# Patient Record
Sex: Female | Born: 1965 | Race: Black or African American | Hispanic: No | Marital: Single | Smoking: Current every day smoker
Health system: Southern US, Community
[De-identification: ages and names within clinical notes are randomized; demographics above are authoritative.]

## PROBLEM LIST (undated history)

## (undated) DIAGNOSIS — R569 Unspecified convulsions: Secondary | ICD-10-CM

## (undated) DIAGNOSIS — R0600 Dyspnea, unspecified: Secondary | ICD-10-CM

## (undated) DIAGNOSIS — Z87442 Personal history of urinary calculi: Secondary | ICD-10-CM

## (undated) DIAGNOSIS — Z9981 Dependence on supplemental oxygen: Secondary | ICD-10-CM

## (undated) DIAGNOSIS — F419 Anxiety disorder, unspecified: Secondary | ICD-10-CM

## (undated) DIAGNOSIS — F32A Depression, unspecified: Secondary | ICD-10-CM

## (undated) DIAGNOSIS — D860 Sarcoidosis of lung: Secondary | ICD-10-CM

## (undated) DIAGNOSIS — N390 Urinary tract infection, site not specified: Secondary | ICD-10-CM

## (undated) DIAGNOSIS — I499 Cardiac arrhythmia, unspecified: Secondary | ICD-10-CM

## (undated) DIAGNOSIS — J449 Chronic obstructive pulmonary disease, unspecified: Secondary | ICD-10-CM

## (undated) DIAGNOSIS — I10 Essential (primary) hypertension: Secondary | ICD-10-CM

## (undated) DIAGNOSIS — R87619 Unspecified abnormal cytological findings in specimens from cervix uteri: Secondary | ICD-10-CM

## (undated) DIAGNOSIS — F329 Major depressive disorder, single episode, unspecified: Secondary | ICD-10-CM

## (undated) DIAGNOSIS — E785 Hyperlipidemia, unspecified: Secondary | ICD-10-CM

## (undated) DIAGNOSIS — G4730 Sleep apnea, unspecified: Secondary | ICD-10-CM

## (undated) DIAGNOSIS — A770 Spotted fever due to Rickettsia rickettsii: Secondary | ICD-10-CM

## (undated) DIAGNOSIS — M1990 Unspecified osteoarthritis, unspecified site: Secondary | ICD-10-CM

## (undated) DIAGNOSIS — F319 Bipolar disorder, unspecified: Secondary | ICD-10-CM

## (undated) DIAGNOSIS — N200 Calculus of kidney: Secondary | ICD-10-CM

## (undated) HISTORY — DX: Dependence on supplemental oxygen: Z99.81

## (undated) HISTORY — PX: BLADDER SURGERY: SHX569

## (undated) HISTORY — DX: Major depressive disorder, single episode, unspecified: F32.9

## (undated) HISTORY — DX: Hyperlipidemia, unspecified: E78.5

## (undated) HISTORY — DX: Unspecified convulsions: R56.9

## (undated) HISTORY — PX: COLONOSCOPY: SHX174

## (undated) HISTORY — PX: KIDNEY STONE SURGERY: SHX686

## (undated) HISTORY — DX: Unspecified abnormal cytological findings in specimens from cervix uteri: R87.619

## (undated) HISTORY — DX: Unspecified osteoarthritis, unspecified site: M19.90

## (undated) HISTORY — PX: TUBAL LIGATION: SHX77

## (undated) HISTORY — DX: Depression, unspecified: F32.A

## (undated) HISTORY — PX: FOOT SURGERY: SHX648

## (undated) HISTORY — DX: Bipolar disorder, unspecified: F31.9

## (undated) HISTORY — PX: JOINT REPLACEMENT: SHX530

## (undated) SURGERY — Surgical Case
Anesthesia: *Unknown

## (undated) SURGERY — BRONCHOSCOPY, FLEXIBLE
Anesthesia: Moderate Sedation | Laterality: Bilateral

---

## 1997-12-24 HISTORY — PX: ABDOMINAL HYSTERECTOMY: SHX81

## 2007-10-01 ENCOUNTER — Emergency Department: Payer: Self-pay | Admitting: Emergency Medicine

## 2009-03-08 ENCOUNTER — Ambulatory Visit: Payer: Self-pay | Admitting: Internal Medicine

## 2009-08-17 ENCOUNTER — Emergency Department: Payer: Self-pay | Admitting: Emergency Medicine

## 2010-02-06 ENCOUNTER — Ambulatory Visit: Payer: Self-pay | Admitting: Otolaryngology

## 2011-04-20 ENCOUNTER — Ambulatory Visit: Payer: Self-pay | Admitting: Neurology

## 2011-08-08 ENCOUNTER — Emergency Department: Payer: Self-pay | Admitting: Emergency Medicine

## 2011-09-29 ENCOUNTER — Emergency Department: Payer: Self-pay | Admitting: Emergency Medicine

## 2011-10-08 ENCOUNTER — Ambulatory Visit: Payer: Self-pay | Admitting: Urology

## 2011-10-15 ENCOUNTER — Ambulatory Visit: Payer: Self-pay | Admitting: Urology

## 2011-10-18 ENCOUNTER — Emergency Department: Payer: Self-pay | Admitting: Emergency Medicine

## 2011-10-29 ENCOUNTER — Ambulatory Visit: Payer: Self-pay | Admitting: Urology

## 2012-05-08 ENCOUNTER — Observation Stay: Payer: Self-pay | Admitting: Internal Medicine

## 2012-05-08 DIAGNOSIS — R079 Chest pain, unspecified: Secondary | ICD-10-CM

## 2012-05-08 LAB — BASIC METABOLIC PANEL
Anion Gap: 9 (ref 7–16)
BUN: 18 mg/dL (ref 7–18)
Calcium, Total: 8.5 mg/dL (ref 8.5–10.1)
Chloride: 107 mmol/L (ref 98–107)
EGFR (African American): >60
Glucose: 105 mg/dL — ABNORMAL HIGH (ref 65–99)
Osmolality: 284 (ref 275–301)
Sodium: 141 mmol/L (ref 136–145)

## 2012-05-08 LAB — CK TOTAL AND CKMB (NOT AT ARMC)
CK, Total: 75 U/L (ref 21–215)
CK, Total: 80 U/L (ref 21–215)
CK-MB: 0.5 ng/mL (ref 0.5–3.6)

## 2012-05-08 LAB — CBC
HCT: 40.3 % (ref 35.0–47.0)
HGB: 13.7 g/dL (ref 12.0–16.0)
MCHC: 34 g/dL (ref 32.0–36.0)
MCV: 95 fL (ref 80–100)
Platelet: 272 10*3/uL (ref 150–440)
RDW: 14.8 % — ABNORMAL HIGH (ref 11.5–14.5)

## 2012-05-08 LAB — TROPONIN I
Troponin-I: <0.02 ng/mL
Troponin-I: <0.02 ng/mL

## 2012-06-26 ENCOUNTER — Emergency Department: Payer: Self-pay | Admitting: *Deleted

## 2012-08-16 ENCOUNTER — Observation Stay: Payer: Self-pay | Admitting: Internal Medicine

## 2012-08-16 LAB — BASIC METABOLIC PANEL
Anion Gap: 9 (ref 7–16)
Calcium, Total: 8.2 mg/dL — ABNORMAL LOW (ref 8.5–10.1)
Co2: 23 mmol/L (ref 21–32)
Creatinine: 0.86 mg/dL (ref 0.60–1.30)
EGFR (African American): >60
Osmolality: 284 (ref 275–301)
Sodium: 140 mmol/L (ref 136–145)

## 2012-08-16 LAB — CBC
HGB: 13.7 g/dL (ref 12.0–16.0)
RBC: 4.2 10*6/uL (ref 3.80–5.20)
WBC: 7.5 10*3/uL (ref 3.6–11.0)

## 2013-05-24 DIAGNOSIS — R569 Unspecified convulsions: Secondary | ICD-10-CM

## 2013-05-24 HISTORY — DX: Unspecified convulsions: R56.9

## 2013-06-12 ENCOUNTER — Emergency Department: Payer: Self-pay | Admitting: Emergency Medicine

## 2013-06-12 LAB — CBC
HCT: 41.3 % (ref 35.0–47.0)
HGB: 14.3 g/dL (ref 12.0–16.0)
MCH: 32.1 pg (ref 26.0–34.0)
MCHC: 34.6 g/dL (ref 32.0–36.0)
MCV: 93 fL (ref 80–100)
RBC: 4.46 10*6/uL (ref 3.80–5.20)
RDW: 14.3 % (ref 11.5–14.5)
WBC: 5.1 10*3/uL (ref 3.6–11.0)

## 2013-06-12 LAB — COMPREHENSIVE METABOLIC PANEL
Albumin: 3.8 g/dL (ref 3.4–5.0)
Alkaline Phosphatase: 96 U/L (ref 50–136)
Anion Gap: 8 (ref 7–16)
Bilirubin,Total: 0.5 mg/dL (ref 0.2–1.0)
Creatinine: 0.62 mg/dL (ref 0.60–1.30)
EGFR (African American): >60
EGFR (Non-African Amer.): >60
Glucose: 127 mg/dL — ABNORMAL HIGH (ref 65–99)
SGOT(AST): 22 U/L (ref 15–37)

## 2013-06-12 LAB — CK TOTAL AND CKMB (NOT AT ARMC)
CK, Total: 80 U/L (ref 21–215)
CK-MB: 0.7 ng/mL (ref 0.5–3.6)

## 2013-06-12 LAB — TROPONIN I: Troponin-I: <0.02 ng/mL

## 2013-06-14 ENCOUNTER — Emergency Department: Payer: Self-pay | Admitting: Emergency Medicine

## 2013-06-14 LAB — CK TOTAL AND CKMB (NOT AT ARMC)
CK, Total: 61 U/L (ref 21–215)
CK-MB: <0.5 ng/mL — ABNORMAL LOW (ref 0.5–3.6)

## 2013-06-14 LAB — CBC
HCT: 40.2 % (ref 35.0–47.0)
MCHC: 35.2 g/dL (ref 32.0–36.0)
MCV: 93 fL (ref 80–100)
Platelet: 266 10*3/uL (ref 150–440)
RBC: 4.31 10*6/uL (ref 3.80–5.20)
RDW: 14.4 % (ref 11.5–14.5)
WBC: 7 10*3/uL (ref 3.6–11.0)

## 2013-06-14 LAB — COMPREHENSIVE METABOLIC PANEL
Anion Gap: 8 (ref 7–16)
Bilirubin,Total: 0.3 mg/dL (ref 0.2–1.0)
Calcium, Total: 8.6 mg/dL (ref 8.5–10.1)
Chloride: 106 mmol/L (ref 98–107)
Co2: 27 mmol/L (ref 21–32)
EGFR (Non-African Amer.): >60
Glucose: 114 mg/dL — ABNORMAL HIGH (ref 65–99)
Potassium: 3.4 mmol/L — ABNORMAL LOW (ref 3.5–5.1)
SGOT(AST): 21 U/L (ref 15–37)
Sodium: 141 mmol/L (ref 136–145)

## 2013-06-14 LAB — TROPONIN I: Troponin-I: <0.02 ng/mL

## 2013-06-30 LAB — HM PAP SMEAR: HM Pap smear: NEGATIVE

## 2013-07-22 ENCOUNTER — Emergency Department: Payer: Self-pay | Admitting: Emergency Medicine

## 2013-07-27 ENCOUNTER — Ambulatory Visit: Payer: Self-pay | Admitting: Neurology

## 2013-08-11 ENCOUNTER — Emergency Department: Payer: Self-pay | Admitting: Emergency Medicine

## 2013-08-11 LAB — URINALYSIS, COMPLETE
Glucose,UR: NEGATIVE mg/dL (ref 0–75)
Ketone: NEGATIVE
Nitrite: NEGATIVE
Ph: 6 (ref 4.5–8.0)
Protein: NEGATIVE
Specific Gravity: 1.012 (ref 1.003–1.030)
Squamous Epithelial: 35
WBC UR: 121 /HPF (ref 0–5)

## 2013-08-11 LAB — BASIC METABOLIC PANEL
BUN: 11 mg/dL (ref 7–18)
Chloride: 105 mmol/L (ref 98–107)
Creatinine: 0.77 mg/dL (ref 0.60–1.30)
EGFR (African American): >60
Glucose: 84 mg/dL (ref 65–99)
Sodium: 137 mmol/L (ref 136–145)

## 2013-08-11 LAB — CBC WITH DIFFERENTIAL/PLATELET
Eosinophil %: 1.2 %
HGB: 15.5 g/dL (ref 12.0–16.0)
Lymphocyte #: 2.9 10*3/uL (ref 1.0–3.6)
MCH: 32.3 pg (ref 26.0–34.0)
MCV: 91 fL (ref 80–100)
Neutrophil #: 1.8 10*3/uL (ref 1.4–6.5)
Platelet: 305 10*3/uL (ref 150–440)
RBC: 4.81 10*6/uL (ref 3.80–5.20)
RDW: 13.9 % (ref 11.5–14.5)
WBC: 5.3 10*3/uL (ref 3.6–11.0)

## 2013-08-12 LAB — TROPONIN I: Troponin-I: <0.02 ng/mL

## 2013-08-21 ENCOUNTER — Ambulatory Visit: Payer: Self-pay | Admitting: Neurology

## 2013-08-26 ENCOUNTER — Emergency Department: Payer: Self-pay | Admitting: Emergency Medicine

## 2013-09-21 ENCOUNTER — Ambulatory Visit: Payer: Self-pay | Admitting: Specialist

## 2013-11-08 ENCOUNTER — Emergency Department: Payer: Self-pay | Admitting: Internal Medicine

## 2013-11-08 LAB — URINALYSIS, COMPLETE
Bacteria: NONE SEEN
Bilirubin,UR: NEGATIVE
Ketone: NEGATIVE
Leukocyte Esterase: NEGATIVE
Protein: NEGATIVE
RBC,UR: NONE SEEN /HPF (ref 0–5)
Specific Gravity: 1.006 (ref 1.003–1.030)
WBC UR: <1 /HPF (ref 0–5)

## 2013-11-08 LAB — DRUG SCREEN, URINE
Barbiturates, Ur Screen: NEGATIVE (ref ?–200)
Benzodiazepine, Ur Scrn: POSITIVE (ref ?–200)
MDMA (Ecstasy)Ur Screen: NEGATIVE (ref ?–500)
Methadone, Ur Screen: NEGATIVE (ref ?–300)
Opiate, Ur Screen: NEGATIVE (ref ?–300)
Tricyclic, Ur Screen: NEGATIVE (ref ?–1000)

## 2013-11-08 LAB — COMPREHENSIVE METABOLIC PANEL
BUN: 12 mg/dL (ref 7–18)
Chloride: 104 mmol/L (ref 98–107)
EGFR (African American): >60
EGFR (Non-African Amer.): >60
Glucose: 99 mg/dL (ref 65–99)
Osmolality: 274 (ref 275–301)
SGOT(AST): 26 U/L (ref 15–37)
Sodium: 137 mmol/L (ref 136–145)

## 2013-11-08 LAB — CBC
HGB: 15.1 g/dL (ref 12.0–16.0)
MCH: 32 pg (ref 26.0–34.0)
MCHC: 34.2 g/dL (ref 32.0–36.0)
MCV: 94 fL (ref 80–100)
Platelet: 286 10*3/uL (ref 150–440)
WBC: 5.5 10*3/uL (ref 3.6–11.0)

## 2013-11-28 ENCOUNTER — Ambulatory Visit: Payer: Self-pay | Admitting: Neurology

## 2013-11-28 ENCOUNTER — Inpatient Hospital Stay: Payer: Self-pay | Admitting: Family Medicine

## 2013-11-28 LAB — CBC
HCT: 43.5 % (ref 35.0–47.0)
MCHC: 34.2 g/dL (ref 32.0–36.0)
MCV: 93 fL (ref 80–100)
Platelet: 312 10*3/uL (ref 150–440)
RBC: 4.7 10*6/uL (ref 3.80–5.20)
RDW: 14.1 % (ref 11.5–14.5)

## 2013-11-28 LAB — COMPREHENSIVE METABOLIC PANEL
BUN: 17 mg/dL (ref 7–18)
Bilirubin,Total: 0.3 mg/dL (ref 0.2–1.0)
Chloride: 106 mmol/L (ref 98–107)
Co2: 25 mmol/L (ref 21–32)
EGFR (African American): >60
EGFR (Non-African Amer.): >60
Glucose: 120 mg/dL — ABNORMAL HIGH (ref 65–99)
Potassium: 3.2 mmol/L — ABNORMAL LOW (ref 3.5–5.1)
SGOT(AST): 28 U/L (ref 15–37)
Sodium: 140 mmol/L (ref 136–145)

## 2013-11-28 LAB — PROTIME-INR
INR: 1
Prothrombin Time: 13.1 secs (ref 11.5–14.7)

## 2013-11-28 LAB — APTT: Activated PTT: 28.6 secs (ref 23.6–35.9)

## 2013-11-28 LAB — CK TOTAL AND CKMB (NOT AT ARMC): CK, Total: 55 U/L (ref 21–215)

## 2013-11-28 LAB — SEDIMENTATION RATE: Erythrocyte Sed Rate: 45 mm/hr — ABNORMAL HIGH (ref 0–20)

## 2013-11-29 LAB — CBC WITH DIFFERENTIAL/PLATELET
Basophil #: 0 10*3/uL (ref 0.0–0.1)
Basophil %: 0.5 %
Eosinophil #: 0 10*3/uL (ref 0.0–0.7)
Eosinophil %: 0 %
HGB: 13.9 g/dL (ref 12.0–16.0)
Lymphocyte %: 20.5 %
MCH: 31.9 pg (ref 26.0–34.0)
MCHC: 34.4 g/dL (ref 32.0–36.0)
Monocyte #: 0.1 x10 3/mm — ABNORMAL LOW (ref 0.2–0.9)
Monocyte %: 1.5 %
Neutrophil %: 77.5 %
Platelet: 290 10*3/uL (ref 150–440)
RDW: 14.2 % (ref 11.5–14.5)
WBC: 4.9 10*3/uL (ref 3.6–11.0)

## 2013-11-29 LAB — BASIC METABOLIC PANEL
Anion Gap: 6 — ABNORMAL LOW (ref 7–16)
Co2: 25 mmol/L (ref 21–32)
EGFR (African American): >60
EGFR (Non-African Amer.): >60
Glucose: 154 mg/dL — ABNORMAL HIGH (ref 65–99)
Potassium: 4 mmol/L (ref 3.5–5.1)

## 2013-11-29 LAB — LIPID PANEL
Cholesterol: 194 mg/dL (ref 0–200)
HDL Cholesterol: 43 mg/dL (ref 40–60)
Ldl Cholesterol, Calc: 139 mg/dL — ABNORMAL HIGH (ref 0–100)
VLDL Cholesterol, Calc: 12 mg/dL (ref 5–40)

## 2013-11-30 LAB — BASIC METABOLIC PANEL
Anion Gap: 7 (ref 7–16)
BUN: 16 mg/dL (ref 7–18)
Chloride: 109 mmol/L — ABNORMAL HIGH (ref 98–107)
Creatinine: 0.81 mg/dL (ref 0.60–1.30)
EGFR (African American): >60
Osmolality: 278 (ref 275–301)

## 2013-11-30 LAB — CBC WITH DIFFERENTIAL/PLATELET
Basophil #: 0 10*3/uL (ref 0.0–0.1)
Basophil %: 0.3 %
Eosinophil #: 0 10*3/uL (ref 0.0–0.7)
HGB: 13 g/dL (ref 12.0–16.0)
Lymphocyte %: 8.2 %
MCHC: 33.9 g/dL (ref 32.0–36.0)
Monocyte #: 1.1 x10 3/mm — ABNORMAL HIGH (ref 0.2–0.9)
Neutrophil %: 85.2 %
Platelet: 300 10*3/uL (ref 150–440)
RBC: 4.1 10*6/uL (ref 3.80–5.20)
RDW: 14.1 % (ref 11.5–14.5)

## 2013-11-30 LAB — PROTIME-INR
INR: 1
Prothrombin Time: 13.4 secs (ref 11.5–14.7)

## 2013-11-30 LAB — APTT: Activated PTT: 27.4 secs (ref 23.6–35.9)

## 2013-11-30 LAB — TSH: Thyroid Stimulating Horm: 0.748 u[IU]/mL

## 2013-12-24 ENCOUNTER — Ambulatory Visit: Payer: Self-pay | Admitting: Neurology

## 2013-12-28 ENCOUNTER — Emergency Department: Payer: Self-pay | Admitting: Emergency Medicine

## 2014-05-01 ENCOUNTER — Emergency Department: Payer: Self-pay | Admitting: Emergency Medicine

## 2014-06-20 ENCOUNTER — Observation Stay: Payer: Self-pay | Admitting: Internal Medicine

## 2014-06-20 LAB — COMPREHENSIVE METABOLIC PANEL
ALK PHOS: 86 U/L
AST: 21 U/L (ref 15–37)
Albumin: 3.5 g/dL (ref 3.4–5.0)
Anion Gap: 5 — ABNORMAL LOW (ref 7–16)
BILIRUBIN TOTAL: 0.9 mg/dL (ref 0.2–1.0)
BUN: 10 mg/dL (ref 7–18)
Calcium, Total: 8.7 mg/dL (ref 8.5–10.1)
Chloride: 107 mmol/L (ref 98–107)
Co2: 27 mmol/L (ref 21–32)
Creatinine: 0.87 mg/dL (ref 0.60–1.30)
EGFR (African American): >60
Glucose: 76 mg/dL (ref 65–99)
OSMOLALITY: 275 (ref 275–301)
Potassium: 3.9 mmol/L (ref 3.5–5.1)
SGPT (ALT): 23 U/L (ref 12–78)
SODIUM: 139 mmol/L (ref 136–145)
TOTAL PROTEIN: 7.7 g/dL (ref 6.4–8.2)

## 2014-06-20 LAB — URINALYSIS, COMPLETE
BILIRUBIN, UR: NEGATIVE
BLOOD: NEGATIVE
Bacteria: NONE SEEN
Glucose,UR: NEGATIVE mg/dL (ref 0–75)
Ketone: NEGATIVE
Leukocyte Esterase: NEGATIVE
NITRITE: NEGATIVE
PH: 6 (ref 4.5–8.0)
PROTEIN: NEGATIVE
RBC,UR: 1 /HPF (ref 0–5)
Specific Gravity: 1.004 (ref 1.003–1.030)
Squamous Epithelial: NONE SEEN
WBC UR: NONE SEEN /HPF (ref 0–5)

## 2014-06-20 LAB — CBC
HCT: 40.5 % (ref 35.0–47.0)
HGB: 13.8 g/dL (ref 12.0–16.0)
MCH: 31.6 pg (ref 26.0–34.0)
MCHC: 34 g/dL (ref 32.0–36.0)
MCV: 93 fL (ref 80–100)
Platelet: 237 10*3/uL (ref 150–440)
RBC: 4.36 10*6/uL (ref 3.80–5.20)
RDW: 16.8 % — ABNORMAL HIGH (ref 11.5–14.5)
WBC: 5 10*3/uL (ref 3.6–11.0)

## 2014-06-20 LAB — TROPONIN I: Troponin-I: <0.02 ng/mL

## 2014-06-20 LAB — PROTIME-INR
INR: 1
PROTHROMBIN TIME: 12.7 s (ref 11.5–14.7)

## 2014-06-20 LAB — TSH: Thyroid Stimulating Horm: 0.972 u[IU]/mL

## 2014-06-21 ENCOUNTER — Ambulatory Visit: Payer: Self-pay | Admitting: Neurology

## 2014-06-22 LAB — BASIC METABOLIC PANEL
ANION GAP: 3 — AB (ref 7–16)
BUN: 17 mg/dL (ref 7–18)
CALCIUM: 8.5 mg/dL (ref 8.5–10.1)
CO2: 27 mmol/L (ref 21–32)
Chloride: 108 mmol/L — ABNORMAL HIGH (ref 98–107)
Creatinine: 0.84 mg/dL (ref 0.60–1.30)
EGFR (African American): >60
EGFR (Non-African Amer.): >60
Glucose: 90 mg/dL (ref 65–99)
Osmolality: 277 (ref 275–301)
Potassium: 3.9 mmol/L (ref 3.5–5.1)
SODIUM: 138 mmol/L (ref 136–145)

## 2014-06-22 LAB — CBC WITH DIFFERENTIAL/PLATELET
BASOS PCT: 0.5 %
Basophil #: 0 10*3/uL (ref 0.0–0.1)
Eosinophil #: 0.1 10*3/uL (ref 0.0–0.7)
Eosinophil %: 1.1 %
HCT: 38 % (ref 35.0–47.0)
HGB: 12.6 g/dL (ref 12.0–16.0)
LYMPHS ABS: 2.7 10*3/uL (ref 1.0–3.6)
Lymphocyte %: 51.2 %
MCH: 30.7 pg (ref 26.0–34.0)
MCHC: 33 g/dL (ref 32.0–36.0)
MCV: 93 fL (ref 80–100)
Monocyte #: 0.5 x10 3/mm (ref 0.2–0.9)
Monocyte %: 9 %
Neutrophil #: 2 10*3/uL (ref 1.4–6.5)
Neutrophil %: 38.2 %
Platelet: 217 10*3/uL (ref 150–440)
RBC: 4.09 10*6/uL (ref 3.80–5.20)
RDW: 16.5 % — ABNORMAL HIGH (ref 11.5–14.5)
WBC: 5.3 10*3/uL (ref 3.6–11.0)

## 2014-06-24 DIAGNOSIS — Z720 Tobacco use: Secondary | ICD-10-CM | POA: Insufficient documentation

## 2014-06-24 DIAGNOSIS — R531 Weakness: Secondary | ICD-10-CM | POA: Insufficient documentation

## 2014-06-24 DIAGNOSIS — Z8669 Personal history of other diseases of the nervous system and sense organs: Secondary | ICD-10-CM | POA: Insufficient documentation

## 2014-10-19 ENCOUNTER — Emergency Department: Payer: Self-pay | Admitting: Internal Medicine

## 2014-10-19 LAB — BASIC METABOLIC PANEL
Anion Gap: 7 (ref 7–16)
BUN: 15 mg/dL (ref 7–18)
CALCIUM: 8.5 mg/dL (ref 8.5–10.1)
CHLORIDE: 108 mmol/L — AB (ref 98–107)
Co2: 27 mmol/L (ref 21–32)
Creatinine: 0.74 mg/dL (ref 0.60–1.30)
EGFR (African American): >60
EGFR (Non-African Amer.): >60
Glucose: 105 mg/dL — ABNORMAL HIGH (ref 65–99)
Osmolality: 284 (ref 275–301)
Potassium: 3.6 mmol/L (ref 3.5–5.1)
Sodium: 142 mmol/L (ref 136–145)

## 2014-10-19 LAB — D-DIMER(ARMC): D-Dimer: 811 ng/ml

## 2014-10-19 LAB — URINALYSIS, COMPLETE
BLOOD: NEGATIVE
Bacteria: NONE SEEN
Bilirubin,UR: NEGATIVE
GLUCOSE, UR: NEGATIVE mg/dL (ref 0–75)
Ketone: NEGATIVE
Leukocyte Esterase: NEGATIVE
NITRITE: NEGATIVE
PROTEIN: NEGATIVE
Ph: 7 (ref 4.5–8.0)
RBC,UR: 18 /HPF (ref 0–5)
SPECIFIC GRAVITY: 1.02 (ref 1.003–1.030)
Squamous Epithelial: 9

## 2014-10-19 LAB — CBC
HCT: 41.1 % (ref 35.0–47.0)
HGB: 13.5 g/dL (ref 12.0–16.0)
MCH: 30 pg (ref 26.0–34.0)
MCHC: 32.7 g/dL (ref 32.0–36.0)
MCV: 92 fL (ref 80–100)
Platelet: 274 10*3/uL (ref 150–440)
RBC: 4.48 10*6/uL (ref 3.80–5.20)
RDW: 16.2 % — AB (ref 11.5–14.5)
WBC: 7.1 10*3/uL (ref 3.6–11.0)

## 2014-10-19 LAB — TROPONIN I
Troponin-I: <0.02 ng/mL
Troponin-I: <0.02 ng/mL

## 2014-10-19 LAB — PRO B NATRIURETIC PEPTIDE: B-Type Natriuretic Peptide: 257 pg/mL — ABNORMAL HIGH (ref 0–125)

## 2014-10-27 ENCOUNTER — Ambulatory Visit: Payer: Self-pay

## 2014-10-27 ENCOUNTER — Ambulatory Visit: Admit: 2014-10-27 | Disposition: A | Discharge status: Home / Self Care | Payer: Self-pay | Admitting: Urgent Care

## 2014-11-08 ENCOUNTER — Observation Stay: Payer: Self-pay | Admitting: Internal Medicine

## 2014-11-08 LAB — URINALYSIS, COMPLETE
BILIRUBIN, UR: NEGATIVE
BLOOD: NEGATIVE
Bacteria: NONE SEEN
Glucose,UR: NEGATIVE mg/dL (ref 0–75)
Ketone: NEGATIVE
LEUKOCYTE ESTERASE: NEGATIVE
NITRITE: NEGATIVE
Ph: 6 (ref 4.5–8.0)
Protein: NEGATIVE
RBC,UR: 2 /HPF (ref 0–5)
Specific Gravity: 1.006 (ref 1.003–1.030)
Squamous Epithelial: <1

## 2014-11-08 LAB — DRUG SCREEN, URINE

## 2014-11-08 LAB — COMPREHENSIVE METABOLIC PANEL
ALK PHOS: 105 U/L
AST: 21 U/L (ref 15–37)
Albumin: 3.6 g/dL (ref 3.4–5.0)
Anion Gap: 7 (ref 7–16)
BUN: 18 mg/dL (ref 7–18)
Bilirubin,Total: 0.3 mg/dL (ref 0.2–1.0)
CREATININE: 0.95 mg/dL (ref 0.60–1.30)
Calcium, Total: 8.1 mg/dL — ABNORMAL LOW (ref 8.5–10.1)
Chloride: 105 mmol/L (ref 98–107)
Co2: 27 mmol/L (ref 21–32)
EGFR (Non-African Amer.): >60
Glucose: 93 mg/dL (ref 65–99)
Osmolality: 279 (ref 275–301)
Potassium: 4.1 mmol/L (ref 3.5–5.1)
SGPT (ALT): 29 U/L
Sodium: 139 mmol/L (ref 136–145)
Total Protein: 8 g/dL (ref 6.4–8.2)

## 2014-11-08 LAB — TROPONIN I
Troponin-I: <0.02 ng/mL
Troponin-I: <0.02 ng/mL

## 2014-11-08 LAB — CBC
HCT: 41.4 % (ref 35.0–47.0)
HGB: 13.9 g/dL (ref 12.0–16.0)
MCH: 30.6 pg (ref 26.0–34.0)
MCHC: 33.5 g/dL (ref 32.0–36.0)
MCV: 92 fL (ref 80–100)
Platelet: 271 10*3/uL (ref 150–440)
RBC: 4.53 10*6/uL (ref 3.80–5.20)
RDW: 16.7 % — AB (ref 11.5–14.5)
WBC: 7.1 10*3/uL (ref 3.6–11.0)

## 2014-11-08 LAB — CK-MB
CK-MB: 0.5 ng/mL (ref 0.5–3.6)
CK-MB: 0.6 ng/mL (ref 0.5–3.6)
CK-MB: <0.5 ng/mL — ABNORMAL LOW (ref 0.5–3.6)

## 2014-11-19 ENCOUNTER — Emergency Department: Payer: Self-pay | Admitting: Emergency Medicine

## 2015-02-14 ENCOUNTER — Ambulatory Visit: Payer: Self-pay | Admitting: Neurology

## 2015-02-14 ENCOUNTER — Observation Stay: Payer: Self-pay | Admitting: Internal Medicine

## 2015-03-10 ENCOUNTER — Encounter: Payer: Self-pay | Admitting: *Deleted

## 2015-03-23 ENCOUNTER — Encounter: Payer: Self-pay | Admitting: General Surgery

## 2015-03-23 ENCOUNTER — Encounter: Payer: 59 | Admitting: General Surgery

## 2015-03-23 NOTE — Progress Notes (Signed)
This encounter was created in error - please disregard.

## 2015-04-12 NOTE — H&P (Signed)
PATIENT NAME:  Nicole Wyatt MR#:  [id number] DATE OF BIRTH:  June 17, 1966  DATE OF ADMISSION:  08/16/2012  PRIMARY CARE PHYSICIAN: Dr. Toy CookeyErnest Eason REFERRING PHYSICIAN: Dr. Lowella FairyJohn Woodruff  CHIEF COMPLAINT: Allergic reaction to a bee sting.   HISTORY OF PRESENT ILLNESS: Nicole Wyatt is a 49 year old African American female who was in her usual state of health until today when she had a bee sting around 5:00 p.m. resulting in feeling of throat being closed associated with shortness of breath and palpitations. She has an EpiPen, however, she did not operate that well and failed to use it appropriately. She called EMS and upon their arrival they gave her epinephrine, Solu-Medrol, H2-blocker and Benadryl. Patient was transported to the Emergency Department she was feeling much better and improving. While she is waiting here she had recurrence of the same symptoms. Treated here and now she is improving but will be admitted for 24-hour observation because of the recurrence of symptoms.    REVIEW OF SYSTEMS: CONSTITUTIONAL: Denies any fever. No chills. No fatigue. EYES: No blurring of vision. No double vision. ENT: No hearing impairment. No sore throat but she felt throat was closing after the bee sting. No dysphagia. CARDIOVASCULAR: No chest pain but she had some shortness of breath and palpitations. No syncope. RESPIRATORY: Reports shortness of breath, now it is improving. No chest pain. No cough. GASTROINTESTINAL: No abdominal pain. No vomiting. No diarrhea. GENITOURINARY: No dysuria. No frequency of urination. MUSCULOSKELETAL: No joint pain or swelling. No muscular pain or swelling. INTEGUMENTARY: No skin rash. No ulcers. NEUROLOGY: No focal weakness. No seizure activity. No headache. PSYCHIATRY: She has history of anxiety and depression. ENDOCRINE: Denies any polyuria or polydipsia. No heat or cold intolerance.   PAST MEDICAL HISTORY:  1. Admitted here in May of this year with pneumonia. Because of chest  pain during that time she had also nuclear stress test with [facility] which was negative.  2. Depression/anxiety. 3. Arthritis. 4. Tobacco abuse.    PAST SURGICAL HISTORY:  1. Nephrolithiasis with removal of stone.  2. Removal of a polyp from vocal cords.  3. Tonsillectomy. 4. Partial hysterectomy.   FAMILY HISTORY: Both parents suffered from hypertension and but they are still alive.   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 She is single and has one child.   SOCIAL HABITS: Chronic smoking. She started smoking at age of 49 and lately she cut down smoking to three cigarettes a day since the place where she is working is a nonsmoker facility. No history of alcohol abuse. There is a remote history of trying marijuana that was at the age of 49 but she quit.   ADMISSION MEDICATIONS:  1. Xanax 0.25 mg 3 times a day p.r.n.  2. Meloxicam 15 mg once a day. 3. Cymbalta 30 mg a day.   ALLERGIES: Penicillin causing hives and rash. Percocet also causing skin rash.   PHYSICAL EXAMINATION:  VITAL SIGNS: Blood pressure 144/80, respiratory rate 20, pulse 64, temperature 98.5, oxygen saturation 97%.   GENERAL APPEARANCE: Young female lying in bed in no acute distress.   HEAD AND NECK: No pallor. No icterus. No cyanosis.   ENT: Hearing was normal. Nasal mucosa, lips, tongue were normal. Uvula at midline. No evidence of swelling.   EYES: Normal eyelids and conjunctiva. The patient is wearing colored contact lens. Pupils about 3 mm, could not detect reactivity to light.   NECK: Supple. Trachea at midline. No thyromegaly.  No cervical lymphadenopathy. No masses.   HEART: Normal S1, S2. No S3, S4. No murmur. No gallop. No carotid bruits.   RESPIRATORY: Normal breathing pattern without use of accessory muscles. No rales. No wheezing.   ABDOMEN: Soft without tenderness. No hepatosplenomegaly. No masses. No hernias.   SKIN: No ulcers. No subcutaneous  nodules.   MUSCULOSKELETAL: No joint swelling. No clubbing.   NEUROLOGIC: Cranial nerves II through XII are intact. No focal motor deficit.   PSYCHIATRY: Patient is alert, oriented x3. Mood and affect were flat.   LABORATORY, DIAGNOSTIC AND RADIOLOGICAL DATA: Serum glucose 162, BUN 15, creatinine 0.8, sodium 140, potassium 3.5. CBC showed white count 7500, hemoglobin 13, hematocrit 39, platelet count 264.   ASSESSMENT:  1. Allergic reaction to a bee sting, anaphylaxis-like reaction.  2. History of anxiety and depression.  3. Tobacco abuse.   PLAN: Will admit for observation and place her on telemetry monitoring with frequent vital signs check-ups q.2 hours. IV Solu-Medrol. Antihistamine, albuterol p.r.n. IV hydration.   TIME SPENT EVALUATING THIS PATIENT: Took more than 50 minutes.    ____________________________ Carney Corners. Rudene Re, MD amd:cms D: 08/16/2012 23:50:30 ET T: 08/17/2012 09:22:55 ET JOB#: [id number]  cc: Carney Corners. Rudene Re, MD, <Dictator> Serita Sheller. Maryellen Pile, MD  Zollie Scale MD ELECTRONICALLY SIGNED 08/17/2012 22:29

## 2015-04-12 NOTE — Discharge Summary (Signed)
PATIENT NAME:  Nicole Wyatt, Nicole Wyatt MR#:  [id number] DATE OF BIRTH:  Oct 24, 1966  DATE OF ADMISSION:  08/16/2012 DATE OF DISCHARGE:  08/17/2012  ADMITTING DIAGNOSIS: Allergic reaction.   DISCHARGE DIAGNOSES:  1. Allergic reaction to bee sting. 2. Hyperglycemia. 3. Anxiety/depression. 4. Obesity.   5. Tobacco abuse.   DISCHARGE CONDITION: Stable.   DISCHARGE MEDICATIONS: The patient is to resume her outpatient medications which are:  1. Cymbalta 30 mg p.o. daily. 2. Xanax 0.25 mg p.o. three times daily as needed. 3. Meloxicam 15 mg p.o. once daily.   NEW MEDICATIONS:  1. Prednisone 50 mg p.o. once on 08/18/2012, then taper by 10 mg daily until stopped.  2. Loratadine 10 mg p.o. daily.  3. Ranitidine 150 mg p.o. twice daily.  4. Nicotine 10 mg inhalation device inhaled every one hour as needed.  5. Nicotine 7 mg transdermal film one patch transdermally once a day.   HOME OXYGEN: None.   DIET: 2 gram salt, low fat, low cholesterol, regular consistency.   ACTIVITY LIMITATIONS: As tolerated.   FOLLOW-UP: Follow-up appointment with Dr. Maryellen Pile in two days after discharge.  HISTORY OF PRESENT ILLNESS: The patient is a 49 year old African American female with past medical history significant for history of allergies to bee stings who presented to the hospital with complaints of allergic reaction to bee sting. Apparently she had a bee sting around 5 p.m. on the day of admission which resulted with feeling of the patient's throat being closed associated with some shortness of breath as well as palpitations. She was not able to use EpiPen and that is when she called EMS and was brought to the Emergency Room for further evaluation. They gave her epinephrine as well as Solu-Medrol, H2-blocker, as well as Benadryl. She was transported to the Emergency Room and was somewhat better and improving. However, while waiting in the Emergency Room she had very similar recurrent symptoms so hospitalist services  were contacted for admission.   PHYSICAL EXAMINATION: On arrival to the Emergency Room, the patient's vitals revealed blood pressure 144/80, respiration rate 20, pulse 64, temperature 98.5, oxygen saturation was 97% on oxygen through nasal cannula Physical exam was unremarkable.   LABORATORY, DIAGNOSTIC, AND RADIOLOGICAL DATA: Lab data taken 08/16/2012 showed glucose elevation to 162, otherwise BMP was unremarkable. The patient's CBC was within normal limits.   HOSPITAL COURSE: The patient was admitted to the hospital. She was continued on steroids as well as Benadryl, Zantac, and she improved. On the day of discharge she felt satisfactory and did not complain of any significant discomfort.  1. In regards to allergic reaction, the patient is to continue steroid taper as well as her Benadryl if she needs to as well as Zantac and follow-up with Dr. Maryellen Pile for further recommendations.  2. For hyperglycemia, attempt was made to order for her hemoglobin A1c. However, the patient did not get that done. It is recommended to follow the patient's hemoglobin A1c as outpatient and make sure that hyperglycemia is just steroid related but not a sign of her diabetes.  3. For anxiety/depression, she is to continue Cymbalta as well as Xanax.   4. For obesity, TSH as well as lipid panel would be appropriate as well as hemoglobin A1c for the patient but this will be up to Dr. Maryellen Pile to decide.   5. For tobacco abuse, that was discussed with the patient for at least three minutes. She was agreeable to try to quit with nicotine replacement therapy which was initiated upon  discharge.  On day of discharge, the patient's vital signs are stable with temperature of 98, pulse 52, respiration rate 18, blood pressure 118/68, saturation 97% on room air at rest.     TIME SPENT: 40 minutes.   ____________________________ Katharina Caperima Ande Therrell, MD rv:drc D: 08/17/2012 18:51:47 ET T: 08/18/2012 11:04:21 ET JOB#: [id number]  cc: Katharina Caperima  Ulices Maack, MD, <Dictator> Serita ShellerErnest B. Maryellen PileEason, MD Katharina CaperIMA Vraj Denardo MD ELECTRONICALLY SIGNED 08/23/2012 15:22

## 2015-04-15 NOTE — Discharge Summary (Signed)
PATIENT NAME:  Nicole Wyatt, Nicole Wyatt MR#:  [id number] DATE OF BIRTH:  July 29, 1966  DATE OF ADMISSION:  11/28/2013 DATE OF DISCHARGE:  12/01/2013  REASON FOR ADMISSION: Worsening of left-sided weakness.   DISCHARGE DIAGNOSES:  1.  Acute-on-chronic left sided weakness. Acute stroke was ruled out. 2.  Possible conversion disorder.  3.  Thyroid nodule.  4.  History of seizures versus pseudoseizures.  5.  Depression and anxiety.  6.  Nicotine dependency.  7.  Possible psychosomatic component.   DISPOSITION: Home.   MEDICATIONS AT DISCHARGE: Keppra 1500 at 10:00 a.m. and then at 7:00 p.m., sertraline 50 mg once daily, oxide carbamazepine 300 mg twice daily, aspirin 81 mg once daily.   FOLLOWUP:  1.  With outpatient psychiatry referral.  2.  Toy Cookey, MD, in 1 to 2 weeks.  3.  Neurology in 2-4 weeks, Dr. Malvin Johns.   IMPORTANT DIAGNOSTIC RESULTS:   ADMISSION LABORATORIES:  Glucose 120, potassium 3.2, total protein 8.8, bilirubin 0.3, troponin 0.02. Erythrocyte sed rate 45. Hemoglobin 14.9. White count 7. INR 1. C-reactive protein 7.0. ANA panel was negative. HSV negative. Keppra level 48 which is normal. Multiple sclerosis panel was sent only on blood because not available to be done in LP due to body weight.   Echocardiogram: Ejection fraction 70% to 75%, normal left ventricular function. No major abnormalities.   EKG: Normal sinus rhythm.   CT of the head without contrast shows unremarkable noncontrast CT of the head.   Chest x-ray showed no evidence of acute process.   MRI of the brain without contrast showed no acute infarct, scattered punctated nonspecific white matter-type changes, most notable frontal lobes, without significant change. These white matter changes may be related to the result of migraine headaches or a small basal disease.   Cerebellar amygdalae  lobe normal.   Ultrasound of the carotid arteries: No significant hemodynamic blockage or stenosis. Vertebral circulation  was patent.   CT angiography of the head and neck to rule out vasculitis was negative. No significant stenosis of the vertebral arteries or carotid arteries. Heterogenous thyroid with dominant lesion within the left lobe measuring up to 1.8 cm. Cervical spondylosis.   MRI, cervical spine: Impression: progressive  spinal and bilateral foraminal stenosis at C6 and C5.   Lumbar puncture attempted but unable to obtain any fluid due to body habitus.   HOSPITAL COURSE: This is a 49 year old female who has history of being seen by Dr. Sherryll Burger and at [facility] neurology due to left-sided weakness. The patient has anxiety, depression, nicotine dependence and presented to the ER with a chief complaint of a 3-day history of worsening left-side weakness, face, shoulder, left upper extremity associated with intermittent episodes of headache. The patient had some fogginess at the level of the left eye but denies any significant speech or swallowing difficulties.   The patient was admitted for evaluation of this condition, and multiple tests were done, and none of them could explain her weakness. As a consultation with neurology, they seemed to think that this problem is more psychogenic in nature as there is no other specific neurologic finding that will correlate with her symptoms or any imaging or tests that will correlate with her symptoms. Actually doing every exam of me and neurologist, there was significant give-away with weakness. Sometimes the patient was weak. Sometimes she had significant strength. MS panel was sent on the blood but not on LP because we were not able to complete the lumbar puncture due to body habitus, for  what I do not think that part of the evaluation is going to be available, although it is less suspicious for that.  The patient is discharged in good condition. It is recommended to have outpatient consultation with neurology and outpatient follow-up with psychiatry.   On top of that, the patient  had a nodule lesion on the thyroid for what referral to ENT was given as well, and then also the patient was started on Trileptal due to headaches.   The findings of the CT scan and the MRI were also related with headache.   TIME SPENT: About 45 minutes with this patient.   ____________________________ Felipa Furnaceoberto Sanchez Gutierrez, MD rsg:np D: 12/03/2013 16:49:51 ET T: 12/03/2013 19:08:54 ET JOB#: [id number]  cc: Felipa Furnaceoberto Sanchez Gutierrez, MD, <Dictator> Sharnice Bosler Juanda ChanceSANCHEZ GUTIERRE MD ELECTRONICALLY SIGNED 12/14/2013 12:20

## 2015-04-15 NOTE — Consult Note (Signed)
PATIENT NAME:  Nicole Wyatt, Nicole Wyatt DATE OF BIRTH:  04-Jun-1966  DATE OF CONSULTATION:  12/01/2013  CONSULTING PHYSICIAN:  Wayman Hoard S. Garnetta BuddyFaheem, MD  REQUESTING PHYSICIAN: Ramonita LabAruna Gouru, MD.   REASON FOR CONSULTATION: Rule out conversion disorder.   HISTORY OF PRESENT ILLNESS: The patient is a 49 year old morbidly obese African American female who was admitted after she was complaining of worsening of left-sided weakness including the face, shoulder and left lower extremity. The patient was evaluated in the presence of her parents and granddaughter. She reported that she came here last Friday as she started noticing worsening of the weakness since last week.  She reported that she is able to move her left arm and showed me that she is getting better. The patient reported that she was referred to the neurologist, [facility] and they have ruled her out for multiple sclerosis and other etiologies. She has been started on Keppra and she was diagnosed with seizure disorders. [facility] to find out etiology for the chronic left-sided weakness. The patient reported that she was also started on Zoloft by her primary care physician for depression, as she has some stressful situations at home, but she has not noticed any improvement. The patient reported that she is not having any other acute issues at this time. She currently denied having any mood or anxiety symptoms. She denied having any suicidal or homicidal ideations or plans.   PAST PSYCHIATRIC HISTORY: The patient reported that she has recently started taking Zoloft and has not noticed any significant improvement. She is not interested in taking any other psychotropic medications. She denied any history of suicide attempts or plans.   PAST MEDICAL HISTORY: Seizure disorder, arthritis and tobacco abuse.    PAST SURGICAL HISTORY: Status post knee surgery, lithotripsy for stone, tonsillectomy, partial hysterectomy,  polyps removed from the focal cords.    ALLERGIES: PENICILLIN, PERCOCET AND BEE STINGS.   PSYCHOSOCIAL HISTORY: The patient currently lives with her parents. She reported that she has a daughter and 2 step-children. She stated that she is on long-term disability from her work as she used to work in [place], but they advised her to stay home due to her illness.   HOME MEDICATIONS:  Keppra 1500 mg twice a day, Zoloft 50 mg once daily, which was started by her primary care physician.   PHYSICAL EXAMINATION: VITAL SIGNS: Temperature 98.3, pulse 53, respirations 19, blood pressure 133/59  LABORATORY DATA: Glucose 76, BUN 16, creatinine 0.81, sodium 138, potassium 3.8, chloride 109, bicarbonate 22, anion gap 7, osmolality 278, calcium 8.9, TSH 0.748. WBC 16.8, RBC 4.10, hemoglobin 13, hematocrit 38.2, platelet count 393, RDW 14.1.   MENTAL STATUS EXAMINATION: The patient is a moderately obese female who was lying in the bed. She was short and brief in responding to most of the questions. Her mood was fine. Affect was blunted and constricted. Thought process was logical. Thought content was nondelusional. She currently denied having any depressive symptoms. No mood symptoms are noted. She demonstrated fair insight and judgment regarding her illness. She denied having any suicidal or homicidal ideation or plans.   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  AXIS II: None.  AXIS III: Seizure disorder.   TREATMENT PLAN: I discussed with the patient about the medications, treatment, risks, benefits and alternatives.  She reported that she is not having acute illness at this time and will continue on the Zoloft as prescribed.  No new medications are prescribed to the patient at this time. She does not appear to have any worsening of her symptoms and is able to move her left extremity at this time. I do not suggest that the patient is having a conversion reaction at this time.   Thank  you for allowing me to participate in the care of this patient.     ____________________________ Ardeen Fillers. Garnetta Buddy, MD usf:dp D: 12/01/2013 14:37:23 ET T: 12/01/2013 15:27:03 ET JOB#: [id number]  cc: Ardeen Fillers. Garnetta Buddy, MD, <Dictator> Rhunette Croft MD ELECTRONICALLY SIGNED 12/08/2013 13:53

## 2015-04-15 NOTE — H&P (Signed)
PATIENT NAME:  Nicole Wyatt, Nicole Wyatt MR#:  [id number] DATE OF BIRTH:  [birth year]  DATE OF ADMISSION:  11/28/2013  REFERRING PHYSICIAN: Dr. Manson Passey.   PRIMARY CARE PHYSICIAN: Dr. Maryellen Pile.   NEUROLOGIST: Dr. Sherryll Burger, referred to [facility] neurologist.  CHIEF COMPLAINT:  Worsening of the left-sided weakness.   HISTORY OF PRESENT ILLNESS: The patient is a 49 year old African-American female with a past medical history of anxiety, depression, seizures and nicotine dependence, is presenting to the ER with a chief complaint of three day history of worsening of the left-sided weakness including the face, shoulder and left lower extremity. The patient is reporting that this is associated with intermittent episodes of headache and somewhat foggy vision in the left eye. Denies any speech or swallowing difficulties. Sometimes she gets confused. Denies any dizziness or passing out. The patient also has reported that since July of 2014 she has been having left-sided weakness and was seen by her primary care physician. After doing the work-up,  she was referred to neurologist, Dr. Sherryll Burger, regarding the ongoing left-sided weakness. After ruling out for lupus, multiple sclerosis and other etiologies, the patient was referred to [facility] neurologist. The patient has been having seizures and she was placed on Keppra 1500 mg twice a day. Still, she has been following with [facility] neurology to find out the etiology of her chronic left-sided weakness. For the past three days, the patient says her left-sided weakness has gotten worse, associated with headache and left-sided foggy vision. Initially, it was on and off, but subsequently it has gotten worse. The patient was able to ambulate with the help of cane, which was prescribed by a neurologist in July. Denies any speech difficulty or swallowing difficulty. No other complaints. Denies any chest pain or shortness of breath.   PAST MEDICAL HISTORY: Anxiety and depression, nicotine dependence, chronic  history of seizures, arthritis, tobacco abuse.    PAST SURGICAL HISTORY: Knee surgery. Lithotripsy of a stone, tonsillectomy, partial hysterectomy, polyp removed from the vocal cords.  ALLERGIES: THE PATIENT IS ALLERGIC TO PENICILLIN, PERCOCET AND BEE STINGS.   PSYCHOSOCIAL HISTORY: Lives at home with family, still smokes, but cutting down on the number of cigarettes. Denies any alcohol or illicit drug usage.   FAMILY HISTORY: Both parents have hypertension. Dad is still alive   HOME MEDICATIONS: Keppra 1500 mg twice a day and Zoloft 50 mg once daily.   REVIEW OF SYSTEMS:  CONSTITUTIONAL: Denies any fever. Complaining of weakness.  EYES: Denies double vision, but has blurry vision on the left eye. Nose: Denies epistaxis, discharge.  RESPIRATORY: Denies cough, COPD.   CARDIOVASCULAR: No chest pain or palpitations.  GASTROINTESTINAL: Denies nausea, vomiting, diarrhea, abdominal pain.  GENITOURINARY: No dysuria or hematuria.  GYNECOLOGIC: Denies breast mass, had a partial hysterectomy.  ENDOCRINE: Denies polyuria, nocturia, thyroid problems.  HEMATOLOGIC AND LYMPHATIC: No anemia, easy bruising or bleeding.  INTEGUMENTARY: No acne, rash, lesions.  MUSCULOSKELETAL: No joint pain in the neck and back. Has chronic arthritis.  NEUROLOGIC: Denies any vertigo, ataxia, complaining of acute on chronic left-sided weakness, no dysarthria. PSYCHIATRIC: Denies any ADD, OCD. Has chronic history of anxiety and depression.   PHYSICAL EXAMINATION: VITAL SIGNS: Temperature 98.3, pulse 59, respirations 18 to 20, blood pressure 102/60, pulse 95% on 2 liters.  GENERAL: Not in acute distress. Moderately built and nourished.  HEENT: Normocephalic, atraumatic. Pupils are equally reacting to light and accommodation. No scleral icterus. No conjunctival injection. Extraocular movements are intact. No sinus tenderness. No postnasal drip. Moist mucous membranes.  NECK: Supple. No JVD. No thyromegaly. Range of motion  is intact.  LUNGS: Clear to auscultation bilaterally. No accessory muscle use and no anterior chest wall tenderness on palpation.  CARDIAC: S1, S2 normal. Regular rate and rhythm. No murmurs.   GASTROINTESTINAL: Soft. Bowel sounds are positive in all four quadrants. Nontender, nondistended. No hepatosplenomegaly. No masses felt.  NEUROLOGIC: Awake, oriented x 3.  Cranial nerves II through XII are grossly intact. Motor: left upper extremity and lower extremity motor is 3 out of 5. Sensory is decreased touch sensation. Right upper and lower extremity motor and sensory are intact. No deviation of the angle of the mouth. Reflexes are 2+.  EXTREMITIES: No edema. No cyanosis. No clubbing.  NEUROLOGIC: Include no cerebellar signs. Positive pronator drift on the left side.  SKIN: Warm to touch. Normal turgor. No rashes. No lesions.  PSYCHIATRIC: Normal mood and affect.  MUSCULOSKELETAL: No joint effusion, tenderness, erythema.   LABORATORY AND IMAGING STUDIES: LFTs are normal, except total protein which is elevated at 8.8. CK total is 55, CPK-MB less than 0.4, troponin less than 0.02. CBC is normal. PT 13.1, INR 1.0. Activated PTT 28.6. BMP: Potassium is low at 3.2. Glucose at 120; rest of the labs are normal. Chest x-ray is normal. Noncontrast CT of the head, no acute findings. A 12-lead EKG, normal sinus rhythm at 62 beats per minute, normal PR and QRS interval. No ST-T wave changes.   ASSESSMENT AND PLAN: A 49 year old African-American female presenting to the ER with a chief complaint of worsening of the chronic left-sided weakness for the past three days associated with headache, will be admitted with following assessment and plan.  1.  Acute on chronic left-sided weakness, rule out acute stroke. We will admit her to telemetry stroke work-up with MRI of the brain, carotid Doppler's and 2-D echocardiogram. Neurology consult is placed to Dr. Sherryll BurgerShah who is on call. The patient will be given aspirin and  statin. PT consult is placed. Bedside swallow evaluation will be done by the nursing and if it is positive, the patient will be nothing by mouth and speech therapy consult.  2.  Outpatient follow-up with ENT, and neurology recommended.  3.  Chronic history of seizures continue Keppra.  4.  Depression and anxiety. Continue Zoloft.  5.  Nicotine dependence. The patient was counseled to quit smoking.   6.  We will provide gastrointestinal and deep vein thrombosis prophylaxis.   Diagnosis and plan of care was discussed in detail with the patient and her daughter at bedside. They both verbalized understanding of the plan. CODE STATUS: She is full code. Daughter is the medical power of attorney.   Total time spent on admission is 45 minutes.    ____________________________ Ramonita LabAruna Toshio Slusher, MD ag:NTS D: 11/28/2013 04:31:02 ET T: 11/28/2013 05:03:48 ET JOB#: [id number] Cc:  Ramonita LabAruna Melicia Esqueda, MD <dictator> Serita ShellerErnest B. Maryellen PileEason, MD Ramonita LabARUNA Erric Machnik MD ELECTRONICALLY SIGNED 12/03/2013 5:50

## 2015-04-16 NOTE — Consult Note (Signed)
   Present Illness The consultation request per Dr.Vaiscute regarding a patient who presented to the hospital with complaints of chest pain and palpitations.  She has ruled out for a myocardial infarction.  Outpatient EKGs and Holter monitors have revealed sinus rhythm with PACs and PVCs.  She stated that her chest pain was midsternal with some radiation.  EKG during the hospitalization revealed sinus rhythm with no ischemia.  She states she is compliant with medications including aspirin 81 mg daily, isosorbide mononitrate 30 mg daily, .   Physical Exam:  GEN well developed, well nourished, no acute distress   HEENT PERRL   NECK supple   RESP normal resp effort  clear BS   CARD Regular rate and rhythm  No murmur   ABD denies tenderness  normal BS   LYMPH negative neck   EXTR negative cyanosis/clubbing   SKIN normal to palpation   NEURO cranial nerves intact, motor/sensory function intact   PSYCH A+O to time, place, person   Review of Systems:  Subjective/Chief Complaint Chest pain and palpitation   General: No Complaints   Skin: No Complaints   ENT: No Complaints   Eyes: No Complaints   Neck: No Complaints   Respiratory: No Complaints   Cardiovascular: Chest pain or discomfort  Tightness  Palpitations   Gastrointestinal: No Complaints   Genitourinary: No Complaints   Vascular: No Complaints   Musculoskeletal: No Complaints   Neurologic: No Complaints   Hematologic: No Complaints   Endocrine: No Complaints   Psychiatric: No Complaints   Review of Systems: All other systems were reviewed and found to be negative   Medications/Allergies Reviewed Medications/Allergies reviewed   EKG:  EKG NSR   Abnormal NSSTTW changes    PCN: Hives, Itching, Rash  Percocet: Hives, Itching, N/V/Diarrhea  Bee Stings: Unknown   Impression 49 year old African American female with complaints of palpitations and chest pain.  She is on isosorbide as an outpatient.  She  is ruled out for myocardial infarction.  Chest pain has both typical atypical features.    Will proceed with a functional study as scheduled.  Will consider discontinue isosorbide and placing on low-dose beta blockers to treat her palpitations and chest discomfort.   Plan 1. Continue with aspirin and nitrates for now. 2. Proceed with functional study as scheduled 3. Pace in the results of this will make a determination regarding further treatment to consider invasive evaluation after functional study is abnormal versus further outpatient medical therapy.  Consideration for stopping the nitrates and placing on low-dose beta-blockers.   Electronic Signatures: Dalia HeadingFath, Carman Essick A (MD)  (Signed [phone]-Nov-15 14:44)  Authored: General Aspect/Present Illness, History and Physical Exam, Review of System, EKG , Allergies, Impression/Plan   Last Updated: 16-Nov-15 14:44 by Dalia HeadingFath, Normand Damron A (MD)

## 2015-04-16 NOTE — Consult Note (Signed)
Referring Physician:  Monica Becton :   Primary Care Physician:  Monica Becton [facility], [address], [place], [place] [address], [place] [phone]  Reason for Consult: Admit Date: 08-Nov-2014  Chief Complaint: chest pain  Reason for Consult: seizure   History of Present Illness: History of Present Illness:   49 yo RHD F presents to [facility] secondary to chest pain.  Pt then states that she developed L arm numbness and weakness that also spread into her foot.  She reports that she passed out after this and had some incontinence of urine.  Pt reports shortness of breath and chest pain prior to episode.  She has been evaluated by Dr. Manuella Ghazi in the past and currently takes Keppra in which she noted a decrease in episodes after taking it.  She started having these episodes in July 2014.    ROS:  General fatigue   HEENT no complaints   Lungs SOB   Cardiac chest pain  palpitations   GI nausea   GU no complaints   Musculoskeletal no complaints   Extremities no complaints   Skin no complaints   Neuro seizure  numbness/tingling   Endocrine no complaints   Psych anxiety  depression   Past Medical/Surgical Hx:  TIA - Transient Ischemic Attack:   Seizures:   Kidney Stones:   vertigo:   sciatic:   muscle spasms:   anxiety:   Tonsillectomy:   Hysterectomy - Partial:   denies:   Past Medical/ Surgical Hx:  Past Medical History reviewed by me as above   Past Surgical History 1. Knee surgery.  2. Lithotripsy.  3. Tonsillectomy.  4. Partial hysterectomy.  5. Polyp removed from the vocal cords   Home Medications: Medication Instructions Last Modified Date/Time  EpiPen 2-Pak 0.3 mg injectable kit 0.3 milligram(s) intramuscular once, As Needed for allergic reaction 16-Nov-15 03:24  isosorbide mononitrate 30 mg oral tablet, extended release 1 tab(s) orally once a day 16-Nov-15 03:24  sertraline 100 mg oral tablet 2 tab(s) orally once a day (at  bedtime) 16-Nov-15 03:24  potassium chloride 20 mEq oral tablet, extended release 1 tab(s) orally once a day 16-Nov-15 03:24  levETIRAcetam 750 mg oral tablet 2 tab(s) orally 2 times a day 16-Nov-15 03:24  venlafaxine extended release 37.5 mg oral capsule, extended release 1 cap(s) orally once a day 16-Nov-15 03:24  furosemide 40 mg oral tablet 1 tab(s) orally once a day 16-Nov-15 03:24  meloxicam 15 mg oral tablet 1 tab(s) orally once a day (at bedtime) 16-Nov-15 03:24  aspirin 81 mg oral delayed release tablet 1 tab(s) orally once a day 16-Nov-15 03:24  Xanax 0.5 mg oral tablet 1 tab(s) orally every 8 hours, As Needed - for Anxiety, Nervousness 16-Nov-15 03:24   Allergies:  PCN: Hives, Itching, Rash  Percocet: Hives, Itching, N/V/Diarrhea  Bee Stings: Unknown  Allergies:  Allergies PCN, percocet   Social/Family History: Employment Status: disabled  Lives With: alone  Living Arrangements: apartment  Social History: denies tob, EtOH and illicits  Family History: no seizures, no stroke   Vital Signs: **Vital Signs.:   16-Nov-15 04:17  Vital Signs Type Admission  Temperature Temperature (F) 97.4  Celsius 36.3  Pulse Pulse 74  Respirations Respirations 18  Systolic BP Systolic BP 549  Mean BP 82  Pulse Ox % Pulse Ox % 97  Pulse Ox Activity Level  At rest  Oxygen Delivery Room Air/ 21 %    09:40  Pulse Pulse 64  Systolic BP Systolic  BP 98  Diastolic BP (mmHg) Diastolic BP (mmHg) 67  Mean BP 77   Physical Exam: General: obese, very flat affect  with poor eye contact, NAD  HEENT: normocephalic, sclera nonicteric, oropharynx clear  Neck: supple, no JVD, no bruits  Chest: CTA B, no wheezing, good movement  Cardiac: RRR, no murmurs, no edema, 2+ pulses  Extremities: no C/C/E, FROM   Neurologic Exam: Mental Status: alert and oriented x 3, normal speech and language, follows complex commands  Cranial Nerves: PERRLA, EOMI, nl VF, face symmetric, tongue midline, shoulder  shrug equal  Motor Exam: 5/5 R, 4/5 L with poor effort, normal, tone, no tremor  Deep Tendon Reflexes: 1+/4 B, plantars downgoing B, no Hoffman  Sensory Exam: pinprick, temperature, and vibration intact B  Coordination: FTN and HTS WNL, nl RAM, nl gait   Lab Results: Hepatic:  16-Nov-15 01:04   Bilirubin, Total 0.3  Alkaline Phosphatase 105 (46-116 NOTE: New Reference Range 07/13/14)  SGPT (ALT) 29 (14-63 NOTE: New Reference Range 07/13/14)  SGOT (AST) 21  Total Protein, Serum 8.0  Albumin, Serum 3.6  Routine Chem:  16-Nov-15 01:04   Glucose, Serum 93  BUN 18  Creatinine (comp) 0.95  Sodium, Serum 139  Potassium, Serum 4.1  Chloride, Serum 105  CO2, Serum 27  Calcium (Total), Serum  8.1  Osmolality (calc) 279  eGFR (African American) >60  eGFR (Non-African American) >60 (eGFR values <41mL/min/1.73 m2 may be an indication of chronic kidney disease (CKD). Calculated eGFR, using the MRDR Study equation, is useful in  patients with stable renal function. The eGFR calculation will not be reliable in acutely ill patients when serum creatinine is changing rapidly. It is not useful in patients on dialysis. The eGFR calculation may not be applicable to patients at the low and high extremes of body sizes, pregnant women, and vegetarians.)  Anion Gap 7  Urine Drugs:  16-Nov-15 01:04   Tricyclic Antidepressant, Ur Qual (comp) NEGATIVE (Result(s) reported on 08 Nov 2014 at 02:50AM.)  Amphetamines, Urine Qual. NEGATIVE  MDMA, Urine Qual. NEGATIVE  Cocaine Metabolite, Urine Qual. NEGATIVE  Opiate, Urine qual NEGATIVE  Phencyclidine, Urine Qual. NEGATIVE  Cannabinoid, Urine Qual. NEGATIVE  Barbiturates, Urine Qual. NEGATIVE  Benzodiazepine, Urine Qual. POSITIVE (----------------- The URINE DRUG SCREEN provides only a preliminary, unconfirmed analytical test result and should not be used for non-medical  purposes.  Clinical consideration and professional judgment should be   applied to any positive drug screen result due to possible interfering substances.  A more specific alternate chemical method must be used in order to obtain a confirmed analytical result.  Gas chromatography/mass spectrometry (GC/MS) is the preferred confirmatory method.)  Methadone, Urine Qual. NEGATIVE  Cardiac:  16-Nov-15 09:02   CPK-MB, Serum  < 0.5 (Result(s) reported on 08 Nov 2014 at 09:54AM.)  Troponin I < 0.02 (0.00-0.05 0.05 ng/mL or less: NEGATIVE  Repeat testing in 3-6 hrs  if clinically indicated. >0.05 ng/mL: POTENTIAL  MYOCARDIAL INJURY. Repeat  testing in 3-6 hrs if  clinically indicated. NOTE: An increase ordecrease  of 30% or more on serial  testing suggests a  clinically important change)  Routine UA:  16-Nov-15 01:04   Color (UA) Straw  Clarity (UA) Clear  Glucose (UA) Negative  Bilirubin (UA) Negative  Ketones (UA) Negative  Specific Gravity (UA) 1.006  Blood (UA) Negative  pH (UA) 6.0  Protein (UA) Negative  Nitrite (UA) Negative  Leukocyte Esterase (UA) Negative (Result(s) reported on 08 Nov 2014 at 02:44AM.)  RBC (UA) 2 /HPF  WBC (UA) 1 /HPF  Bacteria (UA) NONE SEEN  Epithelial Cells (UA) <1 /HPF  Mucous (UA) PRESENT (Result(s) reported on 08 Nov 2014 at 02:44AM.)  Routine Hem:  16-Nov-15 01:04   WBC (CBC) 7.1  RBC (CBC) 4.53  Hemoglobin (CBC) 13.9  Hematocrit (CBC) 41.4  Platelet Count (CBC) 271 (Result(s) reported on 08 Nov 2014 at 01:34AM.)  MCV 92  MCH 30.6  MCHC 33.5  RDW  16.7   Radiology Results: CT:    16-Nov-15 01:32, CT Head Without Contrast  CT Head Without Contrast   REASON FOR EXAM:    SEIZURE, LEFT FACIAL DROOP  COMMENTS:   May transport without cardiac monitor    PROCEDURE: CT  - CT HEAD WITHOUT CONTRAST  - Nov 08 2014  1:32AM     CLINICAL DATA:  Witnessed seizure while at home tonight. The history  of seizures.    EXAM:  CT HEAD WITHOUT CONTRAST    TECHNIQUE:  Contiguous axial images were obtained from the  base of the skull  through the vertex without intravenous contrast.  COMPARISON:  MRI brain 06/21/2014.  CT head 06/20/2014.    FINDINGS:  Ventricles and sulci appear symmetrical. No mass effect or midline  shift. No abnormal extra-axial fluid collections. Gray-white matter  junctions are distinct. Basal cisterns are not effaced. No evidence  of acute intracranial hemorrhage. No depressed skull fractures.  Visualized paranasal sinuses and mastoid air cells are not  opacified.     IMPRESSION:  No acute intracranial abnormalities.      Electronically Signed    By: Lucienne Capers M.D.    On: 11/08/2014 01:34         Verified By: Neale Burly, M.D.,   Radiology Impression: Radiology Impression: CT of head personally reviewed by me and normal   Impression/Recommendations: Recommendations:   prior notes reviewed by me reviewed by me   Possible seizures-  these episodes are very subjective and appear to happen with chest pain but this can be seen with temporal lobe epilepsy;  it is concerning that pt had incontinence and that the episodes improved on max Keppra dosing Anxiety/depression-  not quite controlled no MRI or  EEG needed start Tegretol $RemoveBefore'200mg'[id number]$  BID x 1 week then $RemoveB'200mg'DTGxTJOf$  TID continue Keppra $RemoveBeforeD'1500mg'[id number]$  BID d/c Effexor needs long term video EEG monitoring at [facility] or [place] at some point no driving or operating heavy machinery x 6 months will sign off, please have pt f/u with Dr. Manuella Ghazi in 4-6 weeks  Electronic Signatures: Jamison Neighbor (MD)  (Signed [phone] 14:43)  Authored: REFERRING PHYSICIAN, Primary Care Physician, Consult, History of Present Illness, Review of Systems, PAST MEDICAL/SURGICAL HISTORY, HOME MEDICATIONS, ALLERGIES, Social/Family History, NURSING VITAL SIGNS, Physical Exam-, LAB RESULTS, RADIOLOGY RESULTS, Recommendations   Last Updated: 16-Nov-15 14:43 by Jamison Neighbor (MD)

## 2015-04-16 NOTE — Consult Note (Signed)
PATIENT NAME:  Nicole Wyatt, Nicole Wyatt MR#:  [id number] DATE OF BIRTH:  25-Jul-1966  DATE OF CONSULTATION:  06/21/2014  REFERRING PHYSICIAN:   CONSULTING PHYSICIAN:  Pauletta Browns, MD  REASON FOR CONSULTATION: Status post fall, left-sided weakness, status post stroke code.   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 Sherryll Burger as outpatient. The patient has also chronic left-sided weakness, unclear etiology of left-sided weakness, suspected to be prior stroke in the past. Information obtained from the patient and limited information from the patient's family who are currently at bedside. The patient was transferring from bed to a recliner yesterday. When she sat down in the recliner, she tried to stand up and at that time apparently there was a fall. The patient did not hit her head, but was slumped over on the floor with her head against the wall. At that time, there was apparently shaking activity, limited description of the shaking activity where she was shaking all her limbs and her head uncontrollably for unknown period of time. No tongue biting. No urinary incontinence. No recent history of seizure. As per family, the patient has chronic left-sided weakness. She does drag her left foot, but they stated her left side is weaker now than it was before. Currently close to baseline.   IMAGING: The patient's imaging has been reviewed. MRI of the brain: No acute intracranial pathology. MRA of the neck also no acute stenosis found.   PAST MEDICAL HISTORY: Significant for anxiety and depression, nicotine dependence, questionable seizure and arthritis.   FAMILY HISTORY: Significant for hypertension in both her parents.   PAST SURGICAL HISTORY: Knee surgery, lithotripsy for stones, tonsillectomy, partial hysterectomy, polyp removed from her vocal cords.  REVIEW OF SYSTEMS: The patient is alert, awake, and  oriented. No fatigue. Complains of generalized weakness. No blurry vision. No double vision. No chest pain. No cough. No hemoptysis. No abdominal pain. Weakness on the left side compared to the right. History of anxiety and depression.   LABORATORY DATA: Work-up has been reviewed.   PHYSICAL EXAMINATION: VITAL SIGNS: Include a temperature of 97.7, pulse 55, respirations 18, blood pressure 102/69, pulse ox 97% on room air.  NEUROLOGIC: The patient is alert, awake, and oriented to time, place, and the reason why she is in the hospital. Cranial nerve examination appears to be intact. Extraocular movements intact. Pupils are intact. Facial sensation intact. Facial motor is intact. Tongue is midline. Uvula elevates symmetrically. Shoulder shrug intact. Speech appears to be fluent. No dysarthria or aphasia has been found. Motor strength: The patient has significant giveaway weakness in the left upper extremity. There is a drift in the left upper extremity. When her hand is positioned over her head, she does not drop the arm over her face and she does hold it with good bulk tone. Weakness in the left lower extremity, but positive Hoover's sign. She tries to raise her left leg, but does not push down on the right leg, which would be seen in patients who are really trying to raise their left lower extremity. Sensation appears to be intact. Reflexes present, slightly decreased on the left. Coordination intact but slower on the left. Gait could not be assessed.   IMPRESSION: A 49 year old female with history of chronic questionable seizures, on Keppra, status post fall, status post generalized shaking activity as well. She had questionable postictal state. No tongue biting, no urinary incontinence. Imaging has been negative  so far. No acute intracranial pathology.   PLAN: Based on evaluation, the patient has a lot of giveaway weakness, specifically in the left upper extremity. The patient does have a pronator drift  and drops her left hand, but when that hand is positioned over her head she does not drop it and gradually glides it over her body to drop it down. Positive Hoover sign as well as she does not push down with her right foot to try to lift her left leg so not completely convinced that the whole left side weakness is truly present. It might be weaker but not to the extent that the patient is showing it. I am not completely convinced this was seizure activity. The patient did have shaking but also shaking her head from side to side. No tongue biting. No urinary incontinence. There was questionable postictal state, but it was difficult to assess as poor history is obtained from the patient's family. I would not change her antiepileptic medication, continue on Keppra, continue on the same aspirin. No further neurological imaging at this point. Will start discharge planning and follow up with Dr. Sherryll BurgerShah as an outpatient.   Thank you. It was a pleasure seeing this patient.   ____________________________ Pauletta BrownsYuriy Zeylikman, MD yz:sb D: 06/21/2014 15:03:58 ET T: 06/21/2014 15:49:26 ET JOB#: [id number]  cc: Pauletta BrownsYuriy Zeylikman, MD, <Dictator> Pauletta BrownsYURIY ZEYLIKMAN MD ELECTRONICALLY SIGNED 07/06/2014 14:54

## 2015-04-16 NOTE — Discharge Summary (Signed)
PATIENT NAME:  Nicole Wyatt, Nicole Wyatt MR#:  [id number] DATE OF BIRTH:  03/06/1966  DATE OF ADMISSION:  11/08/2014 DATE OF DISCHARGE:  11/08/2014  ADMITTING DIAGNOSIS: Chest pain.   DISCHARGE DIAGNOSIS: 1.  Chest pain of unclear etiology, likely noncardiac, musculoskeletal, with negative cardiac enzymes for injury and negative Myoview.  2.  Possible seizure.  3.  Anxiety, depression.  4.  Palpitations.  5.  Essential hypertension. Patient is hypotensive with Imdur.  6.  Ongoing tobacco abuse.   DISCHARGE CONDITION: Stable.   DISCHARGE MEDICATIONS: 1.  The patient is to continue the Keppra 1500 mg twice daily.  2.  Meloxicam 15 mg p.o. daily.  3.  EpiPen 0.3 mg intramuscularly as needed.  4.  Isosorbide mononitrate 30 mg daily.  5.  Xanax 0.5 mg every 8 hours as needed.  6.  Sertraline 50 mg p.o. once daily, this is a new dose. 7.  Carbamazepine, which is Tegretol, 200 mg p.o. twice daily for 7 days then 200 mg 3 times daily.  8.  Nicotine oral inhaler 1 inhalation every 1 to 2 hours as needed. 9.  Nicotine transabdominal patch 14 mg topically daily.  10.  Omeprazole 20 mg p.o. daily. 11.  The patient is not to take Lasix, potassium, aspirin, or Effexor, unless it is recommended by primary care physician.  HOME OXYGEN: None.   DIET: 2 g salt, regular consistency.  ACTIVITY LIMITATIONS: As tolerated.  FOLLOWUP: Followup appointment with Dr. Maryellen Pile in 2 days after discharge, Dr. Sherryll Burger, neurology in 2 weeks after discharge.  CONSULTANTS: Care management, social work, Dr. Mellody Drown, Dr. Harold Hedge.   RADIOLOGIC STUDIES: Chest x-ray, portable single view, 11/08/2014 showed no active disease. A CT scan of the head without contrast 11/08/2014 revealed no acute intracranial abnormalities. Myoview stress test 11/08/2014 revealed no significant wall motion abnormalities, normal ejection fraction with no ischemia; overall, low-risk scan. Pharmacological myocardial perfusion study with no  significant ischemia, estimated ejection fraction of 67%, right ventricular global function was normal. There were no EKG changes concerning for ischemia. There was no artifact noted on this study.  HISTORY OF PRESENT ILLNESS: The patient is a 49 year old female with a history of hypertension who presents to the hospital with complaints of seizures and chest pains. According to the admitting physician, Dr. Heron Nay, the patient was brought with seizure disorder and she was noted to have left-sided weakness. In the Emergency Room, her left-sided weakness completely resolved and patient's speech became more fluent. When she arrived to the Emergency Room, she started complaining of chest pains.  VITAL SIGNS: Temperature was 98.3, pulse was 82, her respiration rate was 20, blood pressure 100/67, saturation was 95% on 2 L of oxygen through nasal cannula.   PHYSICAL EXAM: Unremarkable.  LABORATORY DATA: Done on arrival to the hospital. BMP was unremarkable except her calcium level was somewhat low at 8.1. Liver enzymes were normal. Cardiac enzymes x 3 were within normal limits. Urine drug screen was positive for benzodiazepines. CBC within normal limits with white blood cell count 7.1, hemoglobin 13.9, platelet count was 271,000. Urinalysis was unremarkable. The patient's EKG showed normal sinus rhythm at 84 beats per minute, nonspecific T-wave abnormality, and prolonged QTc to 460 ms.  HOSPITAL COURSE: The patient was admitted to the hospital for further evaluation. Her cardiac enzymes were cycled and consultation with cardiologist was obtained and Myoview was ordered. Cardiology saw the patient in consultation and felt that the patient's symptoms did not appear to be ischemic. Recommended to ambulate  the patient and consider discharge on low dose of atenolol. According to Dr. Lady GaryFath, the patient's blood pressure was low; however, he felt that the patient should tolerate this and discontinue isosorbide and  follow up with Dr. Gwen PoundsKowalski. The patient was ambulated; however, since her blood pressure was below 100s and her heart rate remained in the 60s, it was felt that some home medications can be restarted as outpatient, if blood pressure remains stable. Patient is to continue her current management and follow up with Dr. Gwen PoundsKowalski for further recommendations. Because the patient was complaining of palpitations, she was felt to be a good candidate for Holter monitor placement. However, Dr. Lady GaryFath felt that should be decided as outpatient as well. The patient was seen by Dr. Mellody DrownMatthew Smith in regard to the patient's seizure disorder. Dr. Katrinka BlazingSmith felt that the patient's convulsions are possible seizures. According to Dr. Mellody DrownMatthew Smith, these episodes were very subjective and appeared to happen with chest pain, but according to him this could be seen, also, with temporal lobe epilepsy. Per Dr. Katrinka BlazingSmith, it was concerning that the patient had incontinence and that these episodes improved on high Keppra doses. In regard to anxiety, depression, he felt that it was not well controlled. He recommended no Amaryl or electroencephalogram and start patient on Tegretol at 200 mg twice daily dose for 1 week and then 200 mg 3 times daily dose. He recommended to continue Keppra 1500 mg twice daily dose. He recommended to discontinue Effexor and recommended, also, [facility] [place] [facility] some point, and no driving, operating heavy machinery for 6 months. Follow up with Dr. Sherryll BurgerShah in the next 4 to 6 weeks after discharge. The patient was started on Tegretol medication. She was continued on Keppra. She was ambulated on the day of discharge and she was deemed ready to be discharged.  DISCHARGE VITAL SIGNS: On the day of discharge 11/08/2014, the patient's temperature was 97.4, pulse was ranging from 60 to 70, respiration rate was 18, blood pressure ranging from 98 systolic to 106 and 70s diastolic. Oxygen  saturations were 97% on room air at rest.   TIME SPENT: 40 minutes.    ____________________________ Katharina Caperima Zaahir Pickney, MD rv:ST D: 11/08/2014 20:10:59 ET T: 11/08/2014 21:43:36 ET JOB#: [id number]  cc: Katharina Caperima Jesilyn Easom, MD, <Dictator> Serita ShellerErnest B. Maryellen PileEason, MD Hemang K. Sherryll BurgerShah, MD Katharina CaperIMA Darvis Croft MD ELECTRONICALLY SIGNED 11/22/2014 21:05

## 2015-04-16 NOTE — H&P (Signed)
PATIENT NAME:  Nicole Wyatt, Nicole Wyatt MR#:  [id number] DATE OF BIRTH:  1966-10-03  DATE OF ADMISSION:  11/08/2014  PRIMARY CARE PHYSICIAN: Alden ServerErnest B. Maryellen PileEason, MD.  REFERRING PHYSICIAN: [place] SinkJade J. Dolores FrameSung, MD.   CHIEF COMPLAINT: Seizures and chest pain.   HISTORY OF PRESENT ILLNESS: Nicole Wyatt is a 49 year old female who has a history of seizures for the last 1 year with unclear reason of left-sided weakness. She is brought to the Emergency Department after having an episode of seizure and the left-sided weakness. In the Emergency Department, the patient's left-sided weakness was completely resolved and started to speak more fluently. While in the Emergency Department started to complain of chest pain. The patient continues to smoke about a pack a day. Concerning this, the decision is made to admit and do further workup in regards to chest pain. The patient's initial EKG and cardiac enzymes are unremarkable.   PAST MEDICAL HISTORY: 1. Seizures. The nature of the seizures seems to be questionable. The patient never had any trauma in the past. These seizures started recently. Workup in the past has been negative with MRI and EEG. The patient follows up with Dr. Welton FlakesKhan and to Dr. Sherryll BurgerShah. 2. Anxiety, depression. 3. Arthritis.  PAST SURGICAL HISTORY:  1. Knee surgery.  2. Lithotripsy.  3. Tonsillectomy.  4. Partial hysterectomy.  5. Polyp removed from the vocal cords.   ALLERGIES: 1. PERCOCET.  2. PENICILLIN. 3. BEE STINGS.   HOME MEDICATIONS:  1. Xanax 0.5 mg every 8 hours.  2. Venlafaxine extended release 37.5 mg daily.  3. Sertraline 100 mg 2 tablets at bedtime.  4. Potassium chloride 20 mEq daily.  5. Meloxicam 15 mg once a day.  6. Keppra 750 mg 2 tablets 2 times a day.  7. Imdur 30 mg daily.  8. Lasix 40 mg daily.  9. Aspirin 81 mg daily.   SOCIAL HISTORY: Continues to smoke 1 pack a day. Denies drinking alcohol or using illicit drugs. Currently lives with her daughter. Not working. Walks with the  help of a cane.  FAMILY HISTORY: 1. History of hypertension.  2. Heart problems.   REVIEW OF SYSTEMS:  CONSTITUTIONAL: Denies any generalized weakness.  EYES: No change in vision.  ENT: No change in hearing.  RESPIRATORY: No cough, shortness of breath.  CARDIOVASCULAR: No chest pain, palpations.  GASTROINTESTINAL: No nausea, vomiting, abdominal pain.  GENITOURINARY: No dysuria or hematuria HEMATOLOGIC: No easy bruising or bleeding.  SKIN: No rash or lesions.  MUSCULOSKELETAL: Has osteoarthritis. NEUROLOGIC: Has seizures. Complained of weakness on the left side.   PHYSICAL EXAMINATION:  GENERAL: This is a well-built, well-nourished, obese female lying down in the bed, not in distress. Somewhat somnolent from to receiving morphine and Ativan.  VITAL SIGNS: Temperature 98.3, pulse 82, blood pressure 100/67, respiratory rate of 20, oxygen saturation 95% on 2 liters of oxygen.  HEENT: Head normocephalic, atraumatic. There is no scleral icterus. Conjunctivae normal. Pupils equal and react to light. Mucous membranes moist. No pharyngeal erythema.  NECK: Supple. No lymphadenopathy. No JVD. No carotid bruit. No thyromegaly.  CHEST: Has focal tenderness on the left medial aspect of the breast.  LUNGS: Bilaterally clear to auscultation.  HEART: S1, S2 regular. No murmurs are heard.  ABDOMEN: Bowel sounds present. Soft, nontender, nondistended.  EXTREMITIES: No hepatosplenomegaly.  EXTREMITIES: No pedal edema. Pulses 2+.  NEUROLOGIC: The patient is alert, oriented to place, person, and time. Cranial nerves II through XII intact. Motor 5/5 in upper and lower extremities.  SKIN: No  rash or lesions.  MUSCULOSKELETAL: Good range of motion in all the extremities.   LABORATORY DATA: Urine drug screen is positive for benzodiazepines. CT head without contrast: No acute intracranial abnormality. Chest x-ray PA and lateral: No abnormality. CBC, BMP are completely within normal limits.   ASSESSMENT AND  PLAN: Nicole Wyatt is a 49 year old female who comes to the Emergency Department after having questionable seizures and complaining of chest pain.  1. Chest pain: The patient's risk factors are obesity, smoking. Admit the patient to a monitored bed. Cycle cardiac enzymes x 3. Will obtain Lexiscan prior to discharge. However, this seems to more of a musculoskeletal pain. There were nonspecific ST-T wave abnormalities.  2. Seizures: This is a recent onset of seizures. The patient states that despite taking Keppra 1500 mg 2 times a day, the patient continues to have seizures and states that they getting more frequent, almost every month. The patient states she has not contacted her neurologist concerning this. I did not have an EEG report. 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 Consider consulting neurology. Keep the patient on seizure precautions.  3. Hypertension: Currently well controlled.  4. Depression. The patient is on multiple antidepressants. Will continue for now.  5. Tobacco use. Counseled with the patient.  6. Keep the patient on deep vein thrombosis prophylaxis with Lovenox.   TIME SPENT: 55 minutes.   ____________________________ Susa Griffins, MD pv:jh D: 11/08/2014 05:43:00 ET T: 11/08/2014 06:06:02 ET JOB#: [id number]  cc: Susa Griffins, MD, <Dictator> Serita Sheller. Maryellen Pile, MD Susa Griffins MD ELECTRONICALLY SIGNED 11/19/2014 22:34

## 2015-04-16 NOTE — Consult Note (Signed)
PATIENT NAME:  Nicole Wyatt, HERINGER MR#:  [id number] DATE OF BIRTH:  06/23/1966  DATE OF CONSULTATION:  11/28/2013  CONSULTING PHYSICIAN:  Leotis Pain, MD  REASON FOR NEUROLOGICAL EVALUATION: Chronic left upper and left lower extremity weakness with worsening of symptoms over the past 3 days. Information obtained from patient, the patient's family members at bedside, the patient's electronic chart and the patient's primary physician.   HISTORY OF PRESENT ILLNESS:  This is a 49 year old African American female with past medical history of anxiety, depression, suspected seizure disorder, on Keppra and nicotine dependence presenting to the Emergency Department with worsening left-sided weakness of the face, shoulder and left lower extremity that has progressively been getting weaker. The patient states her symptoms initially started in June. She used to work as a Engineer, maintenance (IT) and at that time her vision was foggy and she describes to be what appears to be a seizure that she had to be taken to the hospital. The patient also has chronic weakness in the left side and has been seeing Dr. Manuella Ghazi as outpatient who recently referred her to [facility].  It is suspected that the patient might be having combination real seizures versus nonepileptic seizures or just purely nonepileptic seizures. Despite that, she is on Keppra 1500 mg twice a day. She has never had video EEG monitoring and has never had a seizure while on EEG. The patient states the left-side has been getting weaker again since June and despite seeing several doctors. There is no definite diagnosis that has been found. As per patient, Dr. Manuella Ghazi did not think she has multiple sclerosis. No other complaints at this time. The patient is status post MRI of the brain. No acute intracranial pathology such as acute ischemia or hemorrhage but there are some white matter changes specifically seen on the FLAIR sequence. No changes on MRI  postcontrast.   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  PAST SURGICAL HISTORY: Knee surgery lithotripsy, tonsillectomy, partial hysterectomy, polyp removal of her vocal cords.  PSYCHOSOCIAL HISTORY:  Lives at home with family, smokes. No alcohol, no drug use, States she is currently unemployed and is on disability.   HOME MEDICATIONS: Include Keppra 1500 twice a day and Zoloft 50 mg daily.  REVIEW OF SYSTEMS:   CONSTITUTIONAL:  No fever, no chills.  EYES:  Denies any visual disturbances.  RESPIRATORY:  Denies any cough. Denies any shortness of breath.  GASTROINTESTINAL:  Denies any nausea and vomiting.  GENITOURINARY: No dysuria or hematuria.  ENDOCRINE: Denies polyuria, nocturia, thyroid problems.  MUSCULOSKELETAL: No joint pain.  NEUROLOGICAL: Chronic weakness in the left side.  LABORATORY DATA:   Glucose is 106, ESR is elevated at 45. INR is 1.0. White blood cell count is 7.0, hemoglobin is of 14.9, hematocrit is 43.5, glucose 120, BUN 17 and creatinine is 1.03. Ultrasound of carotids: No evidence hemodynamic stenosis. MRI as described above.   PHYSICAL EXAMINATION: VITAL SIGNS: Include a temperature of 98, pulse 56, respirations 19, blood pressure 109/69 and saturating well on room air.  NEUROLOGIC: The patient is alert, awake, oriented to time, place, location and why she is in the hospital. Able to tell me how many quarters are in $1.50. Speech appears to be fluent. Cranial nerve examination: Extraocular movements are intact. Visual fields appear to be intact even though the patient does state she has some blurred vision in the left side. Facial sensation  appears to be intact.  Facial motor is intact. Tongue is midline. Uvula elevates symmetrically. Shoulder shrug is a little bit weak on the left but the patient states she has pain. Motor strength examination: There is a pronator drift in  the left upper extremity but on further questioning, it appears the patient has pain in her left forearm. Also it appears, the patient does have a lot of giveaway weakness and strength improves after repeated exercises. The patient is slightly weaker in the left lower extremity compared to the right lower extremity. Coordination: Finger-to-nose intact. Sensation: The patient states there is slight diminished sensation on the left side compared to the right. Gait not assessed   IMPRESSION: This 49 year old African American female with chronic history since July of left-sided weakness, along with seizures versus pseudoseizures on Keppra 1500 b.i.d., presenting with worsening left-sided weakness. The patient has been worked up as an outpatient at [facility] and by Dr. Manuella Ghazi here at [facility]. 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 Upon my examination there is a lot of giveaway weakness. The patient does appear stronger on the left side after repeated stimulation and therapeutic exercises. The patient does complain of some pain on the left side. The patient is status post MRI. There is no acute diffusion-weighted imaging abnormality. On the FLAIR flare there are some white matter changes. There was suspicion of vasculitis by the radiology even though I think that is less likely. The patient has elevated ESR, CRP is pending. I think her symptoms are contributed by both anxiety and depression at this point.   PLAN: Agree with obtaining CTA, specifically of head to rule out vasculitis. Will continue steroids until CTA comes back and if CTA does not show any signs of vasculitis would discontinue steroids. If above all negative and the patient still complaining of symptoms we will  still consider possibly lumbar puncture, specifically looking for oligoclonal bands for suspicion of multiple sclerosis even though I think that is less likely, by looking  at her MRI. She does not have any Dawson fingers, any black holes that would contribute to chronic MS since her weakness is chronic in nature. Would followup outpatient, but would also like to do outpatient video EEG monitoring when the patient is admitted for 3 to 5 days, connected to EEG and provoked to have seizure activity to see if these are real seizures and if not, we will try to taper off Keppra. She needs stress management and psychiatric follow up as an outpatient.   Thank you, it was a pleasure seeing this patient. This case was discussed Dr. Laurin Coder.  ____________________________ Leotis Pain, MD yz:dp D: 11/28/2013 14:02:51 ET T: 11/28/2013 14:53:18 ET JOB#: [id number]  cc: Leotis Pain, MD, <Dictator> Leotis Pain MD ELECTRONICALLY SIGNED 12/25/2013 14:49

## 2015-04-16 NOTE — Consult Note (Signed)
PATIENT NAME:  Nicole ScalesGUNTER, Autry L MR#:  [id number] DATE OF BIRTH:  Oct 19, 1966  DATE OF CONSULTATION:  06/21/2014  REFERRING PHYSICIAN:  Dr. Renae GlossWieting  CONSULTING PHYSICIAN:  Lamar BlinksBruce J. Kowalski, MD  REASON FOR CONSULTATION: Syncope with bradycardia.   CHIEF COMPLAINT: Passed out.   HISTORY OF PRESENT ILLNESS: This is a 49 year old female with no evidence of significant cardiovascular history who has had an episode of syncope. She claims that she was a little bit weak and fatigued and then apparently passed out and hit her head. She had a CT scan showing no evidence of significant injury and/or stroke and has had no evidence of myocardial infarction or EKG changes consistent with ischemia. The patient has had some bradycardia with heart rate into the 30s but currently has a resting heart rate of 55 beats per minute. The patient has had no evidence of symptoms during this time. She has not had any heart block or other advanced heart changes on telemetry.   REVIEW OF SYSTEMS: The remainder of review of systems is negative for vision change, ringing in the ears, hearing loss, cough, congestion, heartburn, nausea, vomiting, diarrhea, bloody stools, stomach pain, extremity pain, leg weakness, cramping of the buttocks, known blood clots, headaches, nosebleeds, trouble swallowing, frequent urination, urination at night, muscle weakness, numbness, anxiety, depression, skin lesions or skin rashes.   PAST MEDICAL HISTORY: Borderline hypertension.   FAMILY HISTORY: No family members with early onset of cardiovascular disease or hypertension.   SOCIAL HISTORY: Currently denies alcohol or tobacco use.   ALLERGIES: As listed.    MEDICATIONS: As listed.   PHYSICAL EXAMINATION:  VITAL SIGNS: Blood pressure is 110/62 bilaterally. Heart rate 50 upright, reclining and irregular.  GENERAL: She is a well-appearing large female in no acute distress.  HEENT: No icterus, thyromegaly, ulcers, hemorrhage or xanthelasma.   CARDIOVASCULAR: Bradycardic with normal S1. Distant heart sounds. No apparent murmur, gallop or rub. PMI is diffuse. Carotid upstroke normal without bruit. Jugular venous pressure is normal.  LUNGS: A few basilar crackles with normal respirations.  ABDOMEN: Soft, nontender, without apparent hepatosplenomegaly or masses. Cannot assess her abdominal aorta due to increased abdominal girth.  EXTREMITIES: Show 2+ radial, femoral, dorsal pedal pulses. Trace lower extremity edema. No cyanosis, clubbing or ulcers.  NEUROLOGIC: She is oriented to time, place and person, with normal mood and affect.   ASSESSMENT: This is a 49 year old female with syncope, bradycardia. Will need further evaluation.   RECOMMENDATIONS:  1. Continue telemetry with physical activity and ambulation, following for any worsening issues in heart block or symptomatic bradycardia.  2. Consider treadmill EKG for chronotropic incompetence.  3. Possible discharge to home with event monitor.  4. Echocardiogram for LV systolic dysfunction, valvular heart disease.  5. No evidence for need for cardiac catheterization or ischemic workup due to no evidence of high-risk profile or myocardial infarction.  6. Further treatment options after neurology assesses as well.    ____________________________ Lamar BlinksBruce J. Kowalski, MD bjk:gb D: 06/21/2014 17:10:03 ET T: 06/21/2014 23:45:10 ET JOB#: [id number]  cc: Lamar BlinksBruce J. Kowalski, MD, <Dictator> Lamar BlinksBRUCE J KOWALSKI MD ELECTRONICALLY SIGNED 06/28/2014 13:21

## 2015-04-16 NOTE — H&P (Signed)
Subjective/Chief Complaint dysarthria   History of Present Illness 49 year old female with history of seizure, with chronic left sided weakness unclear if psychogenic vs TIA presents with facial droop, dysarthria, confusion per family. Patient is fully verbal now but unable to recall the events that happened today. Daughter said she noticed a facial droop 2 days ago, then since then has had increased fatigue, somnolence. Today patient was sitting then fell to the floor sideways and hit her head, family states she was "shaking". There was no bowel or bladder incontinence. Since then had some dysarthria though her speech has cleared up during this exam. She states she wants to eat.  On review of records, patient was previously hospitalized in december for worsening left sided weakness but no neurological etiology was found. She walks with a cane and follows with various neurologists, Dr Humphrey Rolls and Dr. Manuella Ghazi  She is bradycardic in the ED but patient states she thinks this is a chronic issue. She does not take any BB   Past History PAST MEDICAL HISTORY: Anxiety and depression, nicotine dependence, chronic history of seizures, arthritis.    PAST SURGICAL HISTORY: Knee surgery. Lithotripsy of a stone, tonsillectomy, partial hysterectomy, polyp removed from the vocal cords.  FAMILY HISTORY: Both parents have hypertension.   Primary Physician Dr. Brynda Greathouse   Past Med/Surgical Hx:  Seizures:   Kidney Stones:   vertigo:   sciatic:   muscle spasms:   anxiety:   Tonsillectomy:   Hysterectomy - Partial:   denies:   ALLERGIES:  PCN: Hives, Itching, Rash  Percocet: Hives, Itching, N/V/Diarrhea  Bee Stings: Unknown  HOME MEDICATIONS: Medication Instructions Status  sertraline 100 mg oral tablet 2 tabs ($Remov'200mg'AaYlIU$ ) orally once a day (at bedtime) Active  meloxicam 15 mg oral tablet 1 tab(s) orally once a day Active  ibuprofen 800 mg oral tablet 1 tab(s) orally every 6 hours as needed for pain Active   potassium chloride 20 mEq oral tablet, extended release 1 tab(s) orally once a day Active  levETIRAcetam 750 mg oral tablet 2 tabs ($Remov'1500mg'DMPBWC$ ) orally 2 times a day Active  hydrOXYzine hydrochloride 25 mg oral tablet 1 tab(s) orally 3 times a day, As Needed Active  venlafaxine extended release 37.5 mg oral capsule, extended release 1 cap(s) orally once a day Active  furosemide 40 mg oral tablet 1 tab(s) orally once a day for edema Active  EpiPen 2-Pak 0.3 mg injectable kit use injectable as directed as needed for allergic reaction. Active   Family and Social History:  Family History Hypertension   Social History positive  tobacco, positive tobacco (Greater than 1 year), negative Illicit drugs   + Tobacco Current (within 1 year)  1/4 PPD   Place of Living Home   Review of Systems:  Subjective/Chief Complaint dysarthia   Fever/Chills No   Cough No   Sputum No   Abdominal Pain No  no melena, no hematochezia   Diarrhea No   Constipation No   Nausea/Vomiting No   SOB/DOE No   Chest Pain No  no palpitations   Telemetry Reviewed Sinus bradycardia at 50 bpm, viewed by me   Dysuria No   ROS admits to chronic left sided weakness. Denies blurred vision, eye pain, tinnitus, ear aches, no new rashes, chronic anxiety/depression, no new myalgias, arthralgias   Medications/Allergies Reviewed Medications/Allergies reviewed   Physical Exam:  GEN well developed, well nourished, obese   HEENT PERRL, hearing intact to voice, moist oral mucosa, Oropharynx clear, good dentition  NECK supple  No masses   RESP normal resp effort  clear BS   CARD regular rate  No LE edema  no JVD   ABD denies tenderness  normal BS  no Adominal Mass   EXTR negative cyanosis/clubbing, negative edema, decreased ROM left leg and left arm   SKIN normal to palpation, No rashes   NEURO positive Babinski R, L side weakness, follows commands, decreased sensation on left side. CN equivoval   PSYCH  alert, A+O to time, place, person, flat affect   Lab Results:  Thyroid:  28-Jun-15 15:14   Thyroid Stimulating Hormone - (0.45-4.50 (International Unit)  ----------------------- Pregnant patients have  different reference  ranges for TSH:  - - - - - - - - - -  Pregnant, first trimetser:  0.36 - 2.50 uIU/mL)    16:20   Thyroid Stimulating Hormone 0.972 (0.45-4.50 (International Unit)  ----------------------- Pregnant patients have  different reference  ranges for TSH:  - - - - - - - - - -  Pregnant, first trimetser:  0.36 - 2.50 uIU/mL)  Hepatic:  28-Jun-15 15:14   Bilirubin, Total -  Alkaline Phosphatase - (45-117 NOTE: New Reference Range 11/13/13)  SGPT (ALT) -  SGOT (AST) -  Total Protein, Serum -  Albumin, Serum -    16:20   Bilirubin, Total 0.9  Alkaline Phosphatase 86 (45-117 NOTE: New Reference Range 11/13/13)  SGPT (ALT) 23  SGOT (AST) 21  Total Protein, Serum 7.7  Albumin, Serum 3.5  Routine Chem:  28-Jun-15 15:14   Glucose, Serum -  BUN -  Creatinine (comp) -  Sodium, Serum -  Potassium, Serum -  Chloride, Serum -  CO2, Serum -  Calcium (Total), Serum -  Osmolality (calc) -  eGFR (African American) -  eGFR (Non-African American) - (eGFR values <41mL/min/1.73 m2 may be an indication of chronic kidney disease (CKD). Calculated eGFR is useful in patients with stable renal function. The eGFR calculation will not be reliable in acutely ill patients when serum creatinine is changing rapidly. It is not useful in  patients on dialysis. The eGFR calculation may not be applicable to patients at the low and high extremes of body sizes, pregnant women, and vegetarians.)  Anion Gap -  Result Comment CHEMISTRY/COAGULATION - SPECIMENS ARE GROSSLY HEMOLYZED  Result(s) reported on 20 Jun 2014 at 03:46PM.    16:20   Glucose, Serum 76  BUN 10  Creatinine (comp) 0.87  Sodium, Serum 139  Potassium, Serum 3.9  Chloride, Serum 107  CO2, Serum 27  Calcium  (Total), Serum 8.7  Osmolality (calc) 275  eGFR (African American) >60  eGFR (Non-African American) >60 (eGFR values <35mL/min/1.73 m2 may be an indication of chronic kidney disease (CKD). Calculated eGFR is useful in patients with stable renal function. The eGFR calculation will not be reliable in acutely ill patients when serum creatinine is changing rapidly. It is not useful in  patients on dialysis. The eGFR calculation may not be applicable to patients at the low and high extremes of body sizes, pregnant women, and vegetarians.)  Anion Gap  5  Cardiac:  28-Jun-15 15:14   Troponin I - (0.00-0.05 0.05 ng/mL or less: NEGATIVE  Repeat testing in 3-6 hrs  if clinically indicated. >0.05 ng/mL: POTENTIAL  MYOCARDIAL INJURY. Repeat  testing in 3-6 hrs if  clinically indicated. NOTE: An increase or decrease  of 30% or more on serial  testing suggests a  clinically important change)    16:20  Troponin I < 0.02 (0.00-0.05 0.05 ng/mL or less: NEGATIVE  Repeat testing in 3-6 hrs  if clinically indicated. >0.05 ng/mL: POTENTIAL  MYOCARDIAL INJURY. Repeat  testing in 3-6 hrs if  clinically indicated. NOTE: An increase or decrease  of 30% or more on serial  testing suggests a  clinically important change)  Routine UA:  28-Jun-15 16:44   Color (UA) Straw  Clarity (UA) Clear  Glucose (UA) Negative  Bilirubin (UA) Negative  Ketones (UA) Negative  Specific Gravity (UA) 1.004  Blood (UA) Negative  pH (UA) 6.0  Protein (UA) Negative  Nitrite (UA) Negative  Leukocyte Esterase (UA) Negative (Result(s) reported on 20 Jun 2014 at 04:58PM.)  RBC (UA) 1 /HPF  WBC (UA) NONE SEEN  Bacteria (UA) NONE SEEN  Epithelial Cells (UA) NONE SEEN  Result(s) reported on 20 Jun 2014 at 04:58PM.  Routine Coag:  28-Jun-15 15:14   Prothrombin -  INR - (INR reference interval applies to patients on anticoagulant therapy. A single INR therapeutic range for coumarins is not optimal for  all indications; however, the suggested range for most indications is 2.0 - 3.0. Exceptions to the INR Reference Range may include: Prosthetic heart valves, acute myocardial infarction, prevention of myocardial infarction, and combinations of aspirin and anticoagulant. The need for a higher or lower target INR must be assessed individually. Reference: The Pharmacology and Management of the Vitamin K  antagonists: the seventh ACCP Conference on Antithrombotic and Thrombolytic Therapy. MVEHM.0947 Sept:126 (3suppl): N9146842. A HCT value >55% may artifactually increase the PT.  In one study,  the increase was an average of 25%. Reference:  "Effect on Routine and Special Coagulation Testing Values of Citrate Anticoagulant Adjustment in Patients with High HCT Values." American Journal of Clinical Pathology 2006;126:400-405.)    16:20   Prothrombin 12.7  INR 1.0 (INR reference interval applies to patients on anticoagulant therapy. A single INR therapeutic range for coumarins is not optimal for all indications; however, the suggested range for most indications is 2.0 - 3.0. Exceptions to the INR Reference Range may include: Prosthetic heart valves, acute myocardial infarction, prevention of myocardial infarction, and combinations of aspirin and anticoagulant. The need for a higher or lower target INR must be assessed individually. Reference: The Pharmacology and Management of the Vitamin K  antagonists: the seventh ACCP Conference on Antithrombotic and Thrombolytic Therapy. [id number] Sept:126 (3suppl): N9146842. A HCT value >55% may artifactually increase the PT.  In one study,  the increase was an average of 25%. Reference:  "Effect on Routine and Special Coagulation Testing Values of Citrate Anticoagulant Adjustment in Patients with High HCT Values." American Journal of Clinical Pathology 2006;126:400-405.)  Routine Hem:  28-Jun-15 15:14   WBC (CBC) 5.0  RBC (CBC) 4.36  Hemoglobin  (CBC) 13.8  Hematocrit (CBC) 40.5  Platelet Count (CBC) 237 (Result(s) reported on 20 Jun 2014 at 03:39PM.)  MCV 93  MCH 31.6  MCHC 34.0  RDW  16.8   Radiology Results: XRay:    28-Jun-15 14:56, Chest Portable Single View  Chest Portable Single View  REASON FOR EXAM:    sob  COMMENTS:       PROCEDURE: DXR - DXR PORTABLE CHEST SINGLE VIEW  - Jun 20 2014  2:56PM     CLINICAL DATA:  Shortness of breath, syncope    EXAM:  PORTABLE CHEST - 1 VIEW    COMPARISON:  11/28/2013    FINDINGS:  Cardiomediastinal silhouette is stable. Mild elevation of the right  hemidiaphragm again noted. No  acute infiltrate or pleural effusion.  No pulmonary edema.     IMPRESSION:  No active disease.      Electronically Signed    By: Lahoma Crocker M.D.    On: 06/20/2014 15:03         Verified By: Ephraim Hamburger, M.D.,  CT:    28-Jun-15 14:42, CT Head Without Contrast  CT Head Without Contrast  REASON FOR EXAM:    LEFT SIDE WEAKNESS  COMMENTS:       PROCEDURE: CT  - CT HEAD WITHOUT CONTRAST  - Jun 20 2014  2:42PM     CLINICAL DATA:  Left side weakness, aphasia    EXAM:  CT HEAD WITHOUT CONTRAST    TECHNIQUE:  Contiguous axial images were obtained from the base of the skull  through the vertex without intravenous contrast.    COMPARISON:  11/29/2013.  FINDINGS:  No skull fracture is noted. No intracranial hemorrhage, mass effect  or midline shift. No acute cortical infarction. Paranasal sinuses  and mastoid air cells are unremarkable. No mass lesion is noted on  this unenhanced scan. The gray and white-matter differentiation is  preserved.     IMPRESSION:  No acute intracranial abnormality. No definite acute cortical  infarction. These results were called by telephone at the time of  interpretation on 06/20/2014 at 2:51 PM to Dr. Ferman Hamming , who  verbally acknowledged these results.      Electronically Signed    By: Lahoma Crocker M.D.    On: 06/20/2014 14:51          Verified By: Ephraim Hamburger, M.D.,    28-Jun-15 17:15, CT Cervical Spine Without Contrast  CT Cervical Spine Without Contrast  REASON FOR EXAM:    fall weakness  COMMENTS:       PROCEDURE: CT  - CT CERVICAL SPINE WO  - Jun 20 2014  5:15PM     CLINICAL DATA:  Fall. Left-sided weakness. RIGHT-sided facial droop.  Vertigo and seizures.    EXAM:  CT CERVICAL SPINE WITHOUT CONTRAST    TECHNIQUE:  Multidetector CT imaging of the cervical spine was performed without  intravenous contrast. Multiplanar CT image reconstructions were also  generated.  COMPARISON:  None.    FINDINGS:  Mild cervical spondylosis is present with disc space narrowing at  C5-C6 and C6-C7. Study is technically degraded in the lower cervical  spine due to body habitus. The odontoid is intact. Occipital  condyles appear within normal limits.    Bilateral foraminal stenosis at C5-C6 due to uncovertebral spurring.  No central stenosis. Visualized lung apices are within normal  limits.     IMPRESSION:  No acute abnormality.  Cervical spondylosis.  Electronically Signed    By: Dereck Ligas M.D.    On: 06/20/2014 17:54         Verified By: Melvyn Novas, M.D.,    Assessment/Admission Diagnosis 49 year old female with possible stroke versus seizure event  telemetry viewed by me sinus bradycardia at 50bpm   Plan 1. Concern for acute stroke given Ct findings - MRI/MRA brain and neck - speech, PT, OT eval - echocardiogram - Aspirin - can consider neurology consult in am   2. Hx of seizures - continue home medications  3. Hx of depression/anxiety: continue home medications  4. Tobacco abuse, continuous: nicotine patch, counseling provided  5. She is on lasix for some knee swelling  6. Sinus bradycardia: monitor  7. proph: Lovenox  F/E/N: NPO until  cleared by ST  Dispo: admit for acute stroke evaluation, will need at least 2 hospital midnights for management  Full code.  Surrogate decision makers Coral Ceo, cousin [phone], daughter Wonda Cerise [phone]  Time spent: 70 minutes Case discussed with ED attending, medical records reviewed   Electronic Signatures: Samson Frederic (MD)  (Signed 28-Jun-15 20:20)  Authored: CHIEF COMPLAINT and HISTORY, PAST MEDICAL/SURGIAL HISTORY, ALLERGIES, HOME MEDICATIONS, FAMILY AND SOCIAL HISTORY, REVIEW OF SYSTEMS, PHYSICAL EXAM, LABS, Radiology, ASSESSMENT AND PLAN   Last Updated: 28-Jun-15 20:20 by Samson Frederic (MD)

## 2015-04-16 NOTE — Discharge Summary (Signed)
PATIENT NAME:  Nicole Wyatt, Nicole Wyatt MR#:  [id number] DATE OF BIRTH:  02/08/1966  DATE OF ADMISSION:  06/20/2014 DATE OF DISCHARGE:  06/22/2014  PRIMARY CARE PHYSICIAN:  Dr. Maryellen Pile    FINAL DIAGNOSES:  1. Syncope versus seizure.  2. Bradycardia.  3. History of seizure.  4. Left-sided weakness, chronic.  5. Anxiety.   MEDICATIONS ON DISCHARGE: Include Zoloft 100 mg 2 tablets at bedtime, potassium chloride 20 mEq daily, Keppra 750 mg 2 tablets twice a day, hydroxyzine, hydrochlorothiazide 25 mg 1 tablet 3 times a day as needed, venlafaxine extended release 37.5 mg 1 tablet daily, Lasix 40 mg daily, EpiPen as needed, meloxicam 15 mg at bedtime, aspirin 81 mg daily. Stop taking ibuprofen.   DIET: Regular diet, regular consistency.   ACTIVITY: As tolerated.   FOLLOWUP:  With Dr. Gwen Pounds next week; Dr. Sherryll Burger, neurology, as scheduled; 1-2 weeks with Dr. Maryellen Pile.    HOSPITAL COURSE:  Patient was admitted as an observation 06/20/2014, discharged 06/22/2014. The patient came in with facial droop, left-sided weakness, dysarthria, some confusion. As per family, was on the floor shaking. No bowel or bladder dysfunction. No tongue-biting.  Admitted for possible stroke.  MRI of the brain, MRI of the cervical spine, and MRA of the brain were ordered.   LABORATORY AND RADIOLOGICAL DATA: Included a CT scan of the head which showed no acute intracranial abnormality.  Chest x-ray: No active disease. White blood cell count 5.0, H and H 13.8 and 40.5, platelet count of 237,000. TSH 0.972. Troponin negative. Glucose 76, BUN 10, creatinine 0.87, sodium 139, potassium 3.9, chloride 107, CO2 of 27, calcium 8.7. Liver function tests normal range. INR 1.0. Urinalysis negative.  EKG: Sinus bradycardia, 49 beats per minute. Nonspecific ST-T wave changes. CT scan of the cervical spine: No acute abnormality, echocardiogram, EF 55% to 60%. MRA of the brain without contrast was normal. MRA of the neck with contrast negative. MRI of the  brain with and without contrast: No acute abnormality, mild chronic microvascular ischemic change in the white matter. White blood cell count upon discharge 5.3, hemoglobin 12.6, platelet count 217,000. Chemistry within normal limits.   CONSULTANTS DURING THE HOSPITAL COURSE: Included Dr. Loretha Brasil, neurology; Dr. Gwen Pounds, cardiology; and physical therapy and occupational therapy.   HOSPITAL COURSE PER PROBLEM LIST:  1. Syncope versus seizure. Unclear what happened to the patient. Patient was seen by Dr. Loretha Brasil who does not believe this was a seizure with no tongue bite or bowel or bladder dysfunction and recommended continuing the Keppra and following up with Dr. Sherryll Burger as an outpatient. The patient had no events here in the hospital and was discharged home in stable condition.  2. Bradycardia. Heart rate mostly in the 40s and 50s during the hospital stay. I did consult Dr. Gwen Pounds who recommended an event monitor. I asked the nurse to connect an event monitor with results to go to Dr. Gwen Pounds prior to discharge. Not sure if the bradycardia would be causing any of her symptoms. Closer followup needed as an outpatient. No acute indications for a pacemaker at this time. Patient is not on any rate-controlling medications.  3. History of seizure. On Keppra. Follow up with Dr. Sherryll Burger as outpatient.  4. Left-sided weakness, which is chronic for the patient. Dr. Loretha Brasil did not elicit any gross weakness, probably more with poor effort.  All imaging studies here do not give [place] a reason for her left-sided weakness. This could be a possible conversion disorder.  5. Anxiety. No changes in  psychiatric medications were made.   TIME SPENT ON DISCHARGE:  35 minutes.    ____________________________ Herschell Dimesichard J. Renae GlossWieting, MD rjw:dd D: 06/22/2014 14:14:25 ET T: 06/22/2014 20:50:16 ET JOB#: [id number]  cc: Herschell Dimesichard J. Renae GlossWieting, MD, <Dictator> Serita ShellerErnest B. Maryellen PileEason, MD  Salley ScarletICHARD J WIETING MD ELECTRONICALLY SIGNED 06/24/2014  16:19

## 2015-04-17 NOTE — Discharge Summary (Signed)
PATIENT NAME:  [name], [name] MR#:  [id number] DATE OF BIRTH:  Jan 25, 1966  DATE OF ADMISSION:  05/08/2012 DATE OF DISCHARGE:  05/09/2012  ADMISSION DIAGNOSES:  1. Chest pain.  2. Pneumonia.  3. Anxiety.  4. Depression.   DISCHARGE DIAGNOSES:  1. Chest pain.  2. Community-acquired pneumonia.  3. Anxiety.  4. Depression.   CONSULTS: None.   STUDIES: Myoview. The patient underwent a nuclear medicine stress test which essentially was normal. No evidence of myocardial ischemia. Ejection fraction was normal.   HOSPITAL COURSE: A 49 year old female who presented with acute chest pain. For further details, please refer to the history and physical.  1. Acute chest pain. This is likely secondary to her pneumonia and/or anxiety. She underwent a Myoview which was essentially negative. Her d-dimer was slightly elevated at 0.50. However, she does have a known pneumonia, which she was being treated for as an outpatient. She had a CT scan during [facility] she came here in which they did a CT angiogram that was negative for pulmonary embolus.  2. Pneumonia. The patient is on Levaquin.  3. Anxiety, on Xanax.  4. Depression, on Cymbalta.   DISCHARGE MEDICATIONS:  1. Cymbalta 30 mg daily.  2. Meloxicam 15 mg daily.  3. Levaquin 500 mg for three days.  4. Xanax 0.25 mg 3 times daily.   DISCHARGE DIET: Regular.   DISCHARGE ACTIVITY: As tolerated.   DISCHARGE FOLLOW-UP: Dr. Maryellen PileEason 05/30 at 10:00.   TIME SPENT: Approximately 35 minutes.   ____________________________ Janyth ContesSital P. Juliene PinaMody, MD spm:ap D: 05/09/2012 12:04:11 ET T: 05/09/2012 12:25:20 ET JOB#: [id number]  cc: Akya Fiorello P. Juliene PinaMody, MD, <Dictator> Serita ShellerErnest B. Maryellen PileEason, MD Janyth ContesSITAL P Nicole Haider MD ELECTRONICALLY SIGNED 05/12/2012 15:27

## 2015-04-17 NOTE — H&P (Signed)
PATIENT NAME:  Nicole Wyatt, Nicole Wyatt MR#:  [id number] DATE OF BIRTH:  [birth year]  DATE OF ADMISSION:  05/08/2012  PRIMARY CARE PHYSICIAN: Dr. Toy Cookey  SUBJECTIVE: This is a 49 year old African American female with a past medical history of depression, arthritis, chronic obstructive pulmonary disease, who presents to the ER with shortness of breath and chest pain. She was recently hospitalized and discharged between the 10th and the 15th May for similar symptoms at [facility]. There she had an extensive workup including a CT angiogram which was negative for PE and showed right and left upper lobe infiltrate. The patient is a smoker and continues to smoke. She also had a cardiology consultation and was recommended to have an outpatient stress test because of a negative 2-D echo because of recurrent chest pain. Today she is being admitted so that she could have an inpatient stress test and be ruled out.   PAST MEDICAL HISTORY:  1. Depression. 2. Arthritis. 3. Anxiety. 4. Tobacco abuse.   PAST SURGICAL HISTORY:  1. Partial hysterectomy. 2. Tonsillectomy. 3. Polyp removal from the vocal cords. 4. Nephrolithiasis removal.   ALLERGIES: Penicillin, oxycodone, Percocet, and aspirin.   FAMILY HISTORY: Hypertension, myocardial infarction, chronic obstructive pulmonary disease.  HOME MEDICATIONS:  1. Cymbalta 30 mg p.o. daily. 2. Mobic 15 mg p.o. daily. 3. Levofloxacin 500 mg p.o. daily. 4. Xanax 0.25 mg p.o. daily.   SOCIAL HISTORY: The patient is married, has one child and is Merchandiser, retail in [employer], also works as a Production designer, theatre/television/film. The patient admits to smoking 4 to 5 cigarettes per day over the last 30 years. She drinks occasionally and stopped drinking 15 years ago. Admits to smoking marijuana about 20 years ago.   REVIEW OF SYSTEMS: GENERAL: The patient denies any change in weight, fatigue, weakness, fever, chills, rigors or sweats. SKIN: Denies any itching, rash,  sores, lumps or moles. HEENT: Head denies any trauma, headache, nausea, vomiting, or visual changes, blurry vision tearing, itching or acute visual changes. Denies any vertigo, discharge nose or sinuses. The patient denies any rhinorrhea, stuffiness, sneezing, itchiness. The patient denies any bleeding gums, hoarseness or sore throat. CARDIAC: The patient has a history of hypertension. Denies any murmurs, actively having angina. Denies any palpitations. Denies any dyspnea on exertion. RESPIRATORY: Positive for shortness of breath. Denies any wheezing, cough, sputum, hemoptysis. GASTROINTESTINAL: The patient denies any appetite changes. Positive for nausea. GENITOURINARY: Denies any frequency, hesitancy, urgency. MUSCULOSKELETAL: Denies any muscle weakness, joint stiffness, decreased range of motion. NEUROLOGIC: Denies any loss of sensation, paralysis, seizures, blackouts.   PHYSICAL EXAMINATION:  VITAL SIGNS: Blood pressure 104/69, respirations 16, pulse 58.   GENERAL: Currently comfortable in no acute cardiopulmonary distress.   HEENT: Pupils equal and reactive. Extraocular movements are intact.   NECK: Supple. No JVD.   LUNGS: Clear to auscultation bilaterally. No wheezes, crackles, or rhonchi.   ABDOMEN: Soft, nontender, nondistended. No hepatosplenomegaly. Positive bowel sounds in all four quadrants. No rebound tenderness or guarding.   GENITOURINARY: External genitalia shows no lesions or excoriations.   MUSCULOSKELETAL: No muscle atrophy or weakness.   NEUROLOGIC: Cranial nerves II to XII grossly intact.   LABORATORY, DIAGNOSTIC AND RADIOLOGICAL DATA: Total CK 80, troponin less than 0.02, sodium 141, potassium 3.6, chloride 107, bicarbonate 25, anion gap 9, WBC 5.9, hemoglobin 13.7, hematocrit 40.3, platelet count 272.    ASSESSMENT AND PLAN:  1. Chest pain. Will admit to telemetry and rule out for acute coronary syndrome. D-dimer is pending. I  do not feel that repeat CT angio or  further workup for PE is needed. We know that the patient has a right and left upper lobe pneumonia. We will continue her levofloxacin and keep her n.p.o. for a stress test. I doubt that the patient will have a positive stress test given the fact that the patient's chest pain has been ongoing for the last week or so with negative cardiac enzymes and negative EKG changes.  2. Hypertension. No indication for beta blocker at this time given the fact that the patient has bradycardia with a heart rate of 52. She did have thyroid function testing done which was within normal limits.  3. History of depression. The patient is on Cymbalta at home. She denies any active suicidal, homicidal ideation.  4. History of arthritis. The patient will continue with Tylenol. Will hold Mobic at this time.  5. History of nicotine dependence. Will need nicotine cessation counseling.  6. She is a FULL CODE.   ____________________________ Richarda OverlieNayana Erick Oxendine, MD na:cms D: 05/08/2012 07:21:16 ET T: 05/08/2012 08:15:26 ET JOB#: [id number]  cc: Richarda OverlieNayana Nayib Remer, MD, <Dictator> Serita ShellerErnest B. Maryellen PileEason, MD Richarda OverlieNAYANA Geral Tuch MD ELECTRONICALLY SIGNED 06/04/2012 0:13

## 2015-04-21 ENCOUNTER — Emergency Department
Admit: 2015-04-21 | Disposition: A | Discharge status: Home / Self Care | Payer: Self-pay | Admitting: Emergency Medicine

## 2015-04-21 LAB — CBC WITH DIFFERENTIAL/PLATELET
BASOS ABS: 0.1 10*3/uL (ref 0.0–0.1)
Basophil %: 1.2 %
EOS ABS: 0.1 10*3/uL (ref 0.0–0.7)
Eosinophil %: 1.7 %
HCT: 36 % (ref 35.0–47.0)
HGB: 12.2 g/dL (ref 12.0–16.0)
LYMPHS ABS: 2.6 10*3/uL (ref 1.0–3.6)
Lymphocyte %: 40.5 %
MCH: 30.2 pg (ref 26.0–34.0)
MCHC: 33.9 g/dL (ref 32.0–36.0)
MCV: 89 fL (ref 80–100)
Monocyte #: 0.5 x10 3/mm (ref 0.2–0.9)
Monocyte %: 8.3 %
Neutrophil #: 3.1 10*3/uL (ref 1.4–6.5)
Neutrophil %: 48.3 %
Platelet: 241 10*3/uL (ref 150–440)
RBC: 4.04 10*6/uL (ref 3.80–5.20)
RDW: 17.2 % — AB (ref 11.5–14.5)
WBC: 6.4 10*3/uL (ref 3.6–11.0)

## 2015-04-21 LAB — COMPREHENSIVE METABOLIC PANEL
ALT: 26 U/L
ANION GAP: 6 — AB (ref 7–16)
Albumin: 3.9 g/dL
Alkaline Phosphatase: 77 U/L
BILIRUBIN TOTAL: 0.4 mg/dL
BUN: 14 mg/dL
CHLORIDE: 106 mmol/L
CREATININE: 0.68 mg/dL
Calcium, Total: 8.8 mg/dL — ABNORMAL LOW
Co2: 27 mmol/L
EGFR (African American): >60
Glucose: 93 mg/dL
Potassium: 3.7 mmol/L
SGOT(AST): 28 U/L
Sodium: 139 mmol/L
TOTAL PROTEIN: 7.6 g/dL

## 2015-04-21 LAB — URINALYSIS, COMPLETE
BILIRUBIN, UR: NEGATIVE
BLOOD: NEGATIVE
Bacteria: NONE SEEN
Glucose,UR: NEGATIVE mg/dL (ref 0–75)
Ketone: NEGATIVE
LEUKOCYTE ESTERASE: NEGATIVE
Nitrite: NEGATIVE
Ph: 6 (ref 4.5–8.0)
Protein: NEGATIVE
SPECIFIC GRAVITY: 1.015 (ref 1.003–1.030)
SQUAMOUS EPITHELIAL: NONE SEEN

## 2015-04-21 LAB — TROPONIN I: Troponin-I: <0.03 ng/mL

## 2015-04-21 LAB — TSH: Thyroid Stimulating Horm: 2.403 u[IU]/mL

## 2015-04-24 NOTE — Consult Note (Signed)
PATIENT NAME:  Nicole Wyatt, Nicole Wyatt MR#:  [id number] DATE OF BIRTH:  1966/05/04  DATE OF CONSULTATION:  02/14/2015  CONSULTING PHYSICIAN:  Pauletta BrownsYuriy Kashay Cavenaugh, MD  REASON FOR CONSULTATION:  Seizure-like activity.   HISTORY OF PRESENT ILLNESS: This is a 49 year old female with a known history of seizures on Keppra 1500 mg twice a day. At baseline, last seizure about 5-6 months ago, generalized tonic-clonic seizure. She had a seizure this a.m. about 5:00 a.m. generalized tonic-clonic with urinary incontinence. Post seizure event the patient was found to have right-sided weakness and right facial droop that has been improving. Again, as stated, the last seizure was about 5-6 months ago. The patient states she is compliant with her antiepileptics which is Keppra 1500 mg twice a day. Provoking symptoms include decreased sleep. The patient states she usually goes to sleep at 10:30 at night but last night could not fall asleep and decided to watch TV until 3:00 in the morning. Currently is still having right side weakness.   REVIEW OF SYSTEMS: No shortness of breath. No chest pain. No abdominal pain. No heat or cold intolerance. Positive weakness on the right side. Positive for seizure. No anxiety or depression.   PAST MEDICAL HISTORY: Seizure disorder, hypertension.  LABORATORY DATA:  Work-up has been reviewed. The patient's LDL was found to be 116. MRI of the brain was done. No acute intracranial findings.   NEUROLOGICAL EVALUATION: Alert, awake, oriented to time, place, location, and the reason why she is in the hospital. Facial sensation is slightly diminished. Facial droop is noted on the right. Tongue is midline and elevates symmetrically. Shoulder shrug is intact. Pronator drift in the right upper extremity is 4+/5 and right lower extremities 4+/5, wrist 5/5. Reflexes symmetrical bilaterally, but diminished. Coordination: Finger-to-nose intact. Gait was not examined.   IMPRESSION: A 49 year old female with  known history of seizures presents with 1 time generalized tonic-clonic seizure activity with urinary incontinence. Her last seizure was about 5 months ago. She is on Keppra 1500 mg twice a day. She is compliant with it.   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 MRI shows no acute intracranial abnormality. The patient's weakness is improving on the right side. Physical therapy, occupational therapy. Continue the Keppra at the same dose 1500 mg twice a day, likely provoked to sleep deprivation. Outpatient follow-up. Likely discharge tomorrow a.m. It was a pleasure seeing this patient.    ____________________________ Pauletta BrownsYuriy Adalynne Steffensmeier, MD yz:mc D: 02/14/2015 15:03:17 ET T: 02/14/2015 15:33:04 ET JOB#: [id number]  cc: Pauletta BrownsYuriy Raeqwon Lux, MD, <Dictator> Pauletta BrownsYURIY Kevontay Burks MD ELECTRONICALLY SIGNED 03/10/2015 12:06

## 2015-04-24 NOTE — H&P (Signed)
PATIENT NAME:  Nicole Wyatt, Nicole Wyatt MR#:  [id number] DATE OF BIRTH:  [birth year]  DATE OF ADMISSION:  02/14/2015  PRIMARY CARE PHYSICIAN: Alden Server B. Maryellen Pile, MD   PRIMARY CARDIOLOGIST: Lamar Blinks, MD  REFERRING EMERGENCY ROOM PHYSICIAN: Rebecka Apley, MD   CHIEF COMPLAINT: Seizure and facial weakness.   HISTORY OF PRESENTING ILLNESS: This is a 49 year old female who has a past history of seizure disorder, TIA, smoking, and depression, who claims to be taking her seizure medication regularly. She said that around 5:00 in the morning her daughter heard a big thumping sound from her room, so she went in the room and found her on the floor. She had urinated on herself and was slightly confused. The patient has history of seizures and as per daughter this was one of the episode of having seizures. While falling down from the bed she hit her head on the side table also, but without any major injuries. After the seizure episode, the patient's face was deviated toward the right side and so daughter brought her to the Emergency Room. In the ER, the ER physician spoke to neurologist on the phone and he suggested to admit the patient to do  workup for stroke or TIA, and so hospitalist service was called in for further management. On my questioning, the patient denies any numbness or weakness in her arms or legs. Denies any headache or visual changes.   REVIEW OF SYSTEMS:  CONSTITUTIONAL: Negative for fever, fatigue, weakness, pain, or weight loss.   EYES: No blurring, double vision, discharge, or redness.  EARS, NOSE, THROAT: No tinnitus, ear pain, or hearing loss.  RESPIRATORY: No cough, wheezing, hemoptysis, or shortness of breath.  CARDIOVASCULAR: No chest pain, orthopnea, edema, arrhythmia, or palpitations.  GASTROINTESTINAL: No nausea, vomiting, diarrhea, or abdominal pain.  GENITOURINARY: No dysuria, hematuria, or increased frequency.  ENDOCRINE: No heat or cold intolerance. No excessive sweating.   SKIN: No acne, rashes, or lesions.  MUSCULOSKELETAL: No pain or swelling in the joints.  NEUROLOGICAL: The patient has facial weakness, face deviated towards the right side. No numbness or tingling in the limbs. No weakness in limbs.  PSYCHIATRIC: No anxiety, insomnia, or bipolar disorder.   PAST MEDICAL HISTORY:  1.  Seizure disorder.  2.  Anxiety and depression.  3.  Smoking.  4.  Arthritis.  5.  Admission with facial droop and speech problem after a seizure in June 2015.   PAST SURGICAL HISTORY:  1.  Tonsillectomy.  2.  Hysterectomy.   SOCIAL HISTORY: She is a smoker and currently she smokes 7 to 8 cigarettes a day. In the past she was smoking 1 pack every day. Denies alcohol or illegal drug use. 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   FAMILY HISTORY: Positive for hypertension.   HOME MEDICATIONS:  1.  Sertraline 100 mg oral tablet 2 tablets once a day.  2.  Potassium chloride 20 mEq once a day.  3.  Meloxicam 7.5 mg 2 tablets once a day. 4.  Keppra 750 mg oral tablet 2 tablets 2 times a day.  5.  Isosorbide mononitrate 30 mg oral tablet once a day.  6.  Hydroxyzine 25 mg oral tablet 3 times a day.  7.  Furosemide 40 oral once a day.  8.  EpiPen as needed.  9.  Effexor 37.5 mg oral tablet once a day.   PHYSICAL EXAMINATION:  VITAL SIGNS: In ER, temperature  98.8, pulse 76, respirations 16, blood pressure 130/82, and pulse oximetry is 95% on room air.  GENERAL: The patient is fully alert and oriented to time, place, and person. Does not appear in any acute distress.  HEENT: Head and neck atraumatic. Conjunctivae pink. Oral mucosa moist. Face is deviated toward the right side.   NECK: Supple. Thyroid nontender.  RESPIRATORY: Bilateral equal and clear air entry. No wheezing or crepitation.  CARDIOVASCULAR: S1, S2 present, regular. No murmur.  ABDOMEN: Soft, nontender. Bowel sounds present. No organomegaly.  No distention.  SKIN: No acne, rashes, or lesions. Skin turgor good.  EXTREMITIES: Legs: No edema. Joints: No swelling or tenderness.  NEUROLOGIC: On the face she has left-sided facial weakness and the face is deviated towards the right side. She is able to move her tongue. Extraocular movements are present. Power 5/5 in all 4 limbs. No tremor or rigidity. Follows commands. Sensations are intact on limbs.   IMPORTANT LABORATORY DATA: Glucose 98, BUN 15, creatinine 0.77, sodium 134, potassium 3.6, chloride 104, CO2 of 25. LDL 116. Osmolality 269. Calcium 8.7. Troponin less than 0.02. Tegretol level less than 0.5. Urine for toxicology is positive for benzodiazepine. WBC 6.9, hemoglobin 13.0, platelet count 253,000, and MCV 89. Urinalysis is grossly negative.   CT scan of the head without contrast: No acute finding, degenerative disease.   ASSESSMENT AND PLAN: A 49 year old female who has a past history of seizure disorder, depression, and smoker, came to hospital after having an episode of seizure and now having left-sided facial weakness.  1.  Transient ischemic attack: As the patient has left-sided facial weakness, Emergency Room physician spoke to neurologist on-call and he suggested to admit the patient and workup for transient ischemic attack. We will admit the patient and do MRI of the brain and carotid Doppler study, and will call neurology consult for further management. Meanwhile, the patient said that she stopped taking her aspirin for the last few days, so we will resume aspirin, check lipid panel, and start statin.  2.  Seizure disorder: She is taking Keppra at home. She had an episode of seizure. She claims to be taking it regular. We will check the Keppra level and call neurologic consult for further management in case they need to change the dose or medication.  3.  Depression: She is taking Effexor and sertraline. We will continue the same over here.  4.  Hypertension: The patient is  taking furosemide daily. We will continue the same in the hospital.  5.  Smoking: Smoking cessation counseling was done for 4 minutes and offered a nicotine patch. She agreed to quit smoking, but she does not want a patch over here.   CODE STATUS: FULL CODE.   TOTAL TIME SPENT ON THIS ADMISSION: 50 minutes.    ____________________________ Hope PigeonVaibhavkumar G. Elisabeth PigeonVachhani, MD vgv:ts D: 02/14/2015 21:17:00 ET T: 02/14/2015 22:00:07 ET JOB#: [id number]  cc: Hope PigeonVaibhavkumar G. Elisabeth PigeonVachhani, MD, <Dictator> Serita ShellerErnest B. Maryellen PileEason, MD    Altamese DillingVAIBHAVKUMAR Dayyan Krist MD ELECTRONICALLY SIGNED 03/07/2015 12:53

## 2015-04-24 NOTE — Discharge Summary (Signed)
PATIENT NAME:  Nicole Wyatt, Nicole Wyatt MR#:  [id number] DATE OF BIRTH:  09-14-1966  DATE OF ADMISSION:  02/14/2015 DATE OF DISCHARGE:  02/15/2015  DISCHARGE DIAGNOSES:  1.  Facial weakness after seizure, cerebrovascular accident ruled out.  2.  Hypertension.  3.  Hyperlipidemia.  4.  Smoking.  5.  Seizure disorder.   MEDICATIONS ON DISCHARGE: 1.  Keppra 750 mg oral 2 tablets 2 times a day.  2.  EpiPen 0.3 mg intramuscular injection once as needed for the allergic reaction.  3.  Isosorbide mononitrate 30 mg oral tablet once a day.  4.  Sertraline 100 mg oral tablet 2 tablets once a day.  5.  Effexor 37.5 mg oral capsule once a day.  6.  Hydroxyzine hydrochloride 25 mg oral 3 times a day.  7.  Furosemide 40 mg oral tablet once a day.  8.  Potassium chloride 20 mEq extended release once a day.  9.  Aspirin 81 mg delayed release once a day.  DIET ON DISCHARGE: Low-sodium, low-fat, low-cholesterol, regular consistency diet.   FOLLOWUP: Advised to have followup with Dr. Maryellen Pile primary care physician in the next 1 to 2 weeks.  HISTORY OF PRESENTING ILLNESS: A 49 year old female who had a past history of seizure disorder, TIA, smoking, and depression. She claimed to be taking seizure medication regularly, said that around 5 in the morning her daughter heard a big thump in the room and so she went to the room and found patient on the floor. She had urinated on herself and was slightly confused with history of seizure, and daughter thought this is one of the episodes. While falling down, the patient also hit her head to the side stable, but without any major injuries. After seizure episode, her face was deviated toward the right side and so daughter brought her to the Emergency Room. ER physician spoke to neurologist, and he advised to admit to hospital for ruling out any CVA or TIA episode.  HOSPITAL COURSE AND STAY:  1.  Transient ischemic attack. This was suspected on admission, but MRI, echocardiogram  and carotid Dopplers were negative, so ruled out. Neurology saw the patient and suggested no further workup, so we discharged the patient home with advice to follow with PMD.  2.  Seizure disorder. She was taking Keppra at home regularly. She had a seizure, most likely because of sleep deprivation, and so neurology suggested no further changes in her seizure medication.  3.  Depression. She was taking Effexor and sertraline. We continued the same.  4.  Hypertension. Was taking furosemide. We continued the same in the hospital.  5.  Smoking. Counseled for 4 minutes to stop smoking, and she agreed for that.  6.  Right knee pain after fall. We did x-ray, which did not show any acute fracture and so discharged home.  IMPORTANT LABORATORY RESULTS IN THE HOSPITAL: CT scan of the head without contrast on admission did not show any acute finding on head or cervical spine. Urine for toxicology was positive for benzodiazepine, negative for all other substances which we are screening.  WBC 6.9, hemoglobin 13, platelet count 253,000, MCV was 89. Creatine 0.77, sodium 134, potassium 13.6, chloride 104, CO2 of 25. Troponin less than 0.02. Urinalysis was negative. Ultrasound carotid Doppler study: Bilateral minimal heterogeneous plaque bilaterally with no hemodynamic significant stenosis. Echocardiogram was done, showed ejection fraction 60 to 65, normal global left ventricular function, mild tricuspid regurgitation. MRI of the brain showed no acute intracranial findings. Mild small-vessel disease.  X-ray, knee, of the right side showed no acute finding. Mild degenerative changes.  TOTAL TIME SPENT ON THIS DISCHARGE: 50 minutes.    ____________________________ Hope PigeonVaibhavkumar G. Elisabeth PigeonVachhani, MD vgv:ST D: 02/18/2015 09:30:24 ET T: 02/18/2015 23:50:34 ET JOB#: [id number]  cc: Hope PigeonVaibhavkumar G. Elisabeth PigeonVachhani, MD, <Dictator> Serita ShellerErnest B. Maryellen PileEason, MD Altamese DillingVAIBHAVKUMAR Angelle Isais MD ELECTRONICALLY SIGNED 03/07/2015 12:54

## 2015-05-16 ENCOUNTER — Telehealth: Payer: Self-pay | Admitting: General Surgery

## 2015-05-16 NOTE — Telephone Encounter (Signed)
PT CALLED TODAY TO CK ON HER REF TO COME FROM DR [facility] FOR [facility].AT THIS TIME PT DOESN'T HAVE AN APPT WE ARE WAITING FOR HER REF.MAUREEN TO CHECK IN [facility] TO CONFIRM REF IS IN OR ISN'T COMPLETED .[facility] HAS BEEN CONTACTED SEVER DIFFERENT TIMES FOR THE REF.WE ARE TOLD EACH TIME DR [facility] ID WORKING ON IT.

## 2015-05-18 ENCOUNTER — Encounter: Payer: Self-pay | Admitting: *Deleted

## 2015-05-18 ENCOUNTER — Emergency Department
Admission: EM | Admit: 2015-05-18 | Discharge: 2015-05-18 | Disposition: A | Discharge status: Home / Self Care | Payer: 59 | Attending: Emergency Medicine | Admitting: Emergency Medicine

## 2015-05-18 ENCOUNTER — Other Ambulatory Visit: Payer: Self-pay

## 2015-05-18 DIAGNOSIS — Z720 Tobacco use: Secondary | ICD-10-CM | POA: Insufficient documentation

## 2015-05-18 DIAGNOSIS — W1839XA Other fall on same level, initial encounter: Secondary | ICD-10-CM | POA: Insufficient documentation

## 2015-05-18 DIAGNOSIS — Z791 Long term (current) use of non-steroidal anti-inflammatories (NSAID): Secondary | ICD-10-CM | POA: Diagnosis not present

## 2015-05-18 DIAGNOSIS — S299XXA Unspecified injury of thorax, initial encounter: Secondary | ICD-10-CM | POA: Diagnosis not present

## 2015-05-18 DIAGNOSIS — S0990XA Unspecified injury of head, initial encounter: Secondary | ICD-10-CM | POA: Insufficient documentation

## 2015-05-18 DIAGNOSIS — Z7982 Long term (current) use of aspirin: Secondary | ICD-10-CM | POA: Insufficient documentation

## 2015-05-18 DIAGNOSIS — Z79899 Other long term (current) drug therapy: Secondary | ICD-10-CM | POA: Insufficient documentation

## 2015-05-18 DIAGNOSIS — R569 Unspecified convulsions: Secondary | ICD-10-CM | POA: Diagnosis not present

## 2015-05-18 DIAGNOSIS — Y998 Other external cause status: Secondary | ICD-10-CM | POA: Diagnosis not present

## 2015-05-18 DIAGNOSIS — Y9389 Activity, other specified: Secondary | ICD-10-CM | POA: Insufficient documentation

## 2015-05-18 DIAGNOSIS — Y9289 Other specified places as the place of occurrence of the external cause: Secondary | ICD-10-CM | POA: Insufficient documentation

## 2015-05-18 NOTE — ED Notes (Signed)
Pt arrived via EMS from home after unwitnessed seizure. Pts family heard pt fall and reported pt was nonverbal when EMS arrived. Pt has hx of seizures with last reported seizure to be a month ago. Pt is unable to remember hitting head during fall but reports tenderness to forehead at this time. BG of 122 per EMS and reports of chest pain of 8/10 that radiates down left arm x 2 days. Pt alert and oriented x 4 upon arrival.

## 2015-05-18 NOTE — Discharge Instructions (Signed)

## 2015-05-18 NOTE — ED Provider Notes (Signed)
[facility] Emergency Department Provider Note  ____________________________________________  Time seen: 8:45 PM  I have reviewed the triage vital signs and the nursing notes.   HISTORY  Chief Complaint Seizures     HPI Nicole Wyatt is a 49 y.o. female who presents with reported seizure was unwitnessed. Patient's family heard the patient fall. Patient has a history of seizures and takes Keppra. She reports compliance with her medication. She denies headache. She denies neuro deficits. She denies fever. She has had extensive workup in the past for seizures. She sees Dr. Sherryll BurgerShah of neurology.She feels well currently and has no complaints     Past Medical History  Diagnosis Date  . Depression   . Seizures   . Bipolar 1 disorder     There are no active problems to display for this patient.   Past Surgical History  Procedure Laterality Date  . Abdominal hysterectomy      Current Outpatient Rx  Name  Route  Sig  Dispense  Refill  . hydrOXYzine (ATARAX/VISTARIL) 25 MG tablet   Oral   Take 25 mg by mouth 3 (three) times daily as needed.         . risperiDONE (RISPERDAL) 0.5 MG tablet   Oral   Take 0.5 mg by mouth at bedtime.         . ALPRAZolam (XANAX) 0.5 MG tablet   Oral   Take 0.5 mg by mouth 2 (two) times daily.         Marland Kitchen. aspirin 81 MG tablet   Oral   Take 81 mg by mouth daily.         . furosemide (LASIX) 40 MG tablet   Oral   Take 40 mg by mouth daily.         . isosorbide dinitrate (ISORDIL) 30 MG tablet   Oral   Take 30 mg by mouth daily.         Marland Kitchen. levETIRAcetam (KEPPRA) 1000 MG tablet   Oral   Take 1,000 mg by mouth 2 (two) times daily.         . meloxicam (MOBIC) 15 MG tablet   Oral   Take 15 mg by mouth daily.         . potassium chloride (KLOR-CON) 20 MEQ packet   Oral   Take by mouth 2 (two) times daily.         . sertraline (ZOLOFT) 100 MG tablet   Oral   Take 100 mg by mouth daily.          Marland Kitchen. venlafaxine XR (EFFEXOR-XR) 37.5 MG 24 hr capsule   Oral   Take 37.5 mg by mouth daily with breakfast.           Allergies Oxycodone and Percocet  History reviewed. No pertinent family history.  Social History History  Substance Use Topics  . Smoking status: Current Every Day Smoker -- 1.00 packs/day for 14 years    Types: Cigarettes  . Smokeless tobacco: Not on file  . Alcohol Use: No    Review of Systems  Constitutional: Negative for fever. Eyes: Negative for visual changes. ENT: Negative for sore throat Cardiovascular: Negative for chest pain. Respiratory: Negative for shortness of breath. Gastrointestinal: Negative for abdominal pain, vomiting and diarrhea. Genitourinary: Negative for dysuria. Musculoskeletal: Negative for back pain. Skin: Negative for rash. Neurological: Negative for headaches or focal weakness   10-point ROS otherwise negative.  ____________________________________________   PHYSICAL EXAM:  VITAL SIGNS: ED  Triage Vitals  Enc Vitals Group     BP --      Pulse --      Resp --      Temp 05/18/15 1953 99 F (37.2 C)     Temp Source 05/18/15 1953 Oral     SpO2 --      Weight --      Height --      Head Cir --      Peak Flow --      Pain Score 05/18/15 2027 8     Pain Loc --      Pain Edu? --      Excl. in GC? --      Constitutional: Alert and oriented. Well appearing and in no distress. Eyes: Conjunctivae are normal. PERRL. ENT   Head: Normocephalic and atraumatic.   Nose: No rhinnorhea.   Mouth/Throat: Mucous membranes are moist. Cardiovascular: Normal rate, regular rhythm. Normal and symmetric distal pulses are present in all extremities. No murmurs, rubs, or gallops. Respiratory: Normal respiratory effort without tachypnea nor retractions. Breath sounds are clear and equal bilaterally.  Gastrointestinal: Soft and non-tender in all quadrants. No distention. There is no CVA tenderness. Genitourinary:  deferred Musculoskeletal: Nontender with normal range of motion in all extremities. No lower extremity tenderness nor edema. Neurologic:  Normal speech and language. No gross focal neurologic deficits are appreciated. Skin:  Skin is warm, dry and intact. No rash noted. Psychiatric: Mood and affect are normal. Patient exhibits appropriate insight and judgment.  ____________________________________________    LABS (pertinent positives/negatives)  Labs Reviewed  CBG MONITORING, ED    ____________________________________________   EKG  ED ECG REPORT I, Jene Every, the attending physician, personally viewed and interpreted this ECG.   Date: 05/18/2015  EKG Time: 8:04 PM  Rate: 85  Rhythm: normal sinus rhythm, prolonged QT interval  Axis: Normal  Intervals: Prolonged QT interval  ST&T Change: None   ____________________________________________    RADIOLOGY  None  ____________________________________________   PROCEDURES  Procedure(s) performed: none  Critical Care performed: none  ____________________________________________   INITIAL IMPRESSION / ASSESSMENT AND PLAN / ED COURSE  Pertinent labs & imaging results that were available during my care of the patient were reviewed by me and considered in my medical decision making (see chart for details).  Patient well-appearing. In no distress. Benign exam. She is very anxious to go home and is not within the hospital. I have convinced her to stay for a brief period of ED observation to be sure there are no more seizures. We'll discuss medication management with neurology  ____________________________________________   FINAL CLINICAL IMPRESSION(S) / ED DIAGNOSES  Final diagnoses:  Seizure     Jene Every, MD 05/18/15 2140

## 2015-06-09 ENCOUNTER — Encounter: Payer: Self-pay | Admitting: General Surgery

## 2015-06-09 ENCOUNTER — Ambulatory Visit (INDEPENDENT_AMBULATORY_CARE_PROVIDER_SITE_OTHER): Payer: 59 | Admitting: General Surgery

## 2015-06-09 VITALS — BP 130/80 | HR 80 | Resp 20 | Ht 68.0 in | Wt 295.0 lb

## 2015-06-09 DIAGNOSIS — N649 Disorder of breast, unspecified: Secondary | ICD-10-CM

## 2015-06-09 DIAGNOSIS — Z1211 Encounter for screening for malignant neoplasm of colon: Secondary | ICD-10-CM | POA: Diagnosis not present

## 2015-06-09 DIAGNOSIS — L988 Other specified disorders of the skin and subcutaneous tissue: Secondary | ICD-10-CM

## 2015-06-09 MED ORDER — POLYETHYLENE GLYCOL 3350 17 GM/SCOOP PO POWD: ORAL | Status: DC

## 2015-06-09 NOTE — Progress Notes (Signed)
Patient ID: Nicole Wyatt, female   DOB: [birth year], 49 y.o.   MRN: [id number]  Chief Complaint  Patient presents with  . Colonoscopy    HPI Nicole Wyatt is a 49 y.o. female here today for a evaluation of a screening colonoscopy. Patient states no GI problems at this time.  Patient states she has a mole on her left breast that has been there for 6 month. Mole has changed in color and starting to drain.   Last mammogram was done on November  2015.  The patient was admitted in February 2016 after she was found on the floor. Impression was likely sleep deprivation. Complete evaluation for TIA was negative. No change medications recommended at that time. HPI  Past Medical History  Diagnosis Date  . Depression   . Bipolar 1 disorder   . Oxygen dependent   . Arthritis   . Seizures 05/2013    Past Surgical History  Procedure Laterality Date  . Abdominal hysterectomy  1999    Family History  Problem Relation Age of Onset  . Hypertension Mother   . Hypertension Father     Social History History  Substance Use Topics  . Smoking status: Former Smoker -- 1.00 packs/day for 14 years    Types: Cigarettes  . Smokeless tobacco: Not on file  . Alcohol Use: No    Allergies  Allergen Reactions  . Oxycodone Hives  . Percocet [Oxycodone-Acetaminophen] Hives    Current Outpatient Prescriptions  Medication Sig Dispense Refill  . ALPRAZolam (XANAX) 0.5 MG tablet Take 0.5 mg by mouth 2 (two) times daily.    Marland Kitchen aspirin 81 MG tablet Take 81 mg by mouth daily.    . furosemide (LASIX) 40 MG tablet Take 40 mg by mouth daily.    . hydrOXYzine (ATARAX/VISTARIL) 25 MG tablet Take 25 mg by mouth 3 (three) times daily as needed.    . isosorbide dinitrate (ISORDIL) 30 MG tablet Take 30 mg by mouth daily.    Marland Kitchen levETIRAcetam (KEPPRA) 1000 MG tablet Take 1,000 mg by mouth 2 (two) times daily.    . meloxicam (MOBIC) 15 MG tablet Take 15 mg by mouth daily.    . potassium chloride (KLOR-CON)  20 MEQ packet Take by mouth 2 (two) times daily.    . risperiDONE (RISPERDAL) 0.5 MG tablet Take 0.5 mg by mouth at bedtime.    . sertraline (ZOLOFT) 100 MG tablet Take 100 mg by mouth daily.    Marland Kitchen venlafaxine XR (EFFEXOR-XR) 37.5 MG 24 hr capsule Take 37.5 mg by mouth daily with breakfast.    . polyethylene glycol powder (GLYCOLAX/MIRALAX) powder 255 grams one bottle for colonoscopy prep 255 g 0   No current facility-administered medications for this visit.    Review of Systems Review of Systems  Constitutional: Negative.   HENT: Negative.   Eyes: Negative.   Respiratory: Negative.   Cardiovascular: Negative.   Gastrointestinal: Negative.   Endocrine: Negative.   Genitourinary: Negative.   Skin: Negative.   Allergic/Immunologic: Negative.     Blood pressure 130/80, pulse 80, resp. rate 20, height  (1.727 m), weight 295 lb (133.811 kg).  Physical Exam Physical Exam  Constitutional: She is oriented to person, place, and time. She appears well-developed and well-nourished.  Eyes: Conjunctivae are normal. No scleral icterus.  Neck: Neck supple.  Cardiovascular: Normal rate, regular rhythm and normal heart sounds.   Pulmonary/Chest: Effort normal and breath sounds normal.    1 cm skin cyst left breast.  Left breast skin cyst under breast lower inner quadrant   Lymphadenopathy:    She has no cervical adenopathy.  Neurological: She is alert and oriented to person, place, and time.  Skin: Skin is warm and dry.    Data Reviewed Mammograms dated 10/27/2014 were independently reviewed. Moderately dense breast consistent with age. No focal area of concern. BI-RADS-1.  Assessment    Candidate for screening colonoscopy.  Draining skin lesion of the left breast. Likely sebaceous cyst with chronic infection.    Plan    It was elected to proceed to excision of the symptomatic area in the upper inner quadrant of the left breast. 10 mL of 0.5% Xylocaine with 0.25% Marcaine with  1-200,000 units of epinephrine was utilized well tolerated. Chlor prep was applied to the skin. An elliptical incision was used to remove the area which was approximately 1 cm in diameter. Hemostasis was with 3-0 Vicryls ties. The deep tissue was approximated with interrupted 3-0 Vicryls sutures. The skin was approximated with interrupted 4-0 nylon sutures and Steri-Strips were applied. Telfa and Tegaderm dressing placed. Postoperative wound care was reviewed.      Colonoscopy with possible biopsy/polypectomy prn: Information regarding the procedure, including its potential risks and complications (including but not limited to perforation of the bowel, which may require emergency surgery to repair, and bleeding) was verbally given to the patient. Educational information regarding lower intestinal endoscopy was given to the patient. Written instructions for how to complete the bowel prep using Miralax were provided. The importance of drinking ample fluids to avoid dehydration as a result of the prep emphasized.  Patient has been scheduled for a colonoscopy on 07-19-15 at [facility]. This patient may continue Mobic and baby aspirin once daily.    Nicole Wyatt 06/10/2015, 2:47 PM

## 2015-06-09 NOTE — Patient Instructions (Addendum)
Colonoscopy A colonoscopy is an exam to look at the entire large intestine (colon). This exam can help find problems such as tumors, polyps, inflammation, and areas of bleeding. The exam takes about 1 hour.  LET [facility] CARE PROVIDER KNOW ABOUT:   Any allergies you have.  All medicines you are taking, including vitamins, herbs, eye drops, creams, and over-the-counter medicines.  Previous problems you or members of your family have had with the use of anesthetics.  Any blood disorders you have.  Previous surgeries you have had.  Medical conditions you have. RISKS AND COMPLICATIONS  Generally, this is a safe procedure. However, as with any procedure, complications can occur. Possible complications include:  Bleeding.  Tearing or rupture of the colon wall.  Reaction to medicines given during the exam.  Infection (rare). BEFORE THE PROCEDURE   Ask your health care provider about changing or stopping your regular medicines.  You may be prescribed an oral bowel prep. This involves drinking a large amount of medicated liquid, starting the day before your procedure. The liquid will cause you to have multiple loose stools until your stool is almost clear or light green. This cleans out your colon in preparation for the procedure.  Do not eat or drink anything else once you have started the bowel prep, unless your health care provider tells you it is safe to do so.  Arrange for someone to drive you home after the procedure. PROCEDURE   You will be given medicine to help you relax (sedative).  You will lie on your side with your knees bent.  A long, flexible tube with a light and camera on the end (colonoscope) will be inserted through the rectum and into the colon. The camera sends video back to a computer screen as it moves through the colon. The colonoscope also releases carbon dioxide gas to inflate the colon. This helps your health care provider see the area better.  During  the exam, your health care provider may take a small tissue sample (biopsy) to be examined under a microscope if any abnormalities are found.  The exam is finished when the entire colon has been viewed. AFTER THE PROCEDURE   Do not drive for 24 hours after the exam.  You may have a small amount of blood in your stool.  You may pass moderate amounts of gas and have mild abdominal cramping or bloating. This is caused by the gas used to inflate your colon during the exam.  Ask when your test results will be ready and how you will get your results. Make sure you get your test results. Document Released: 12/07/2000 Document Revised: 09/30/2013 Document Reviewed: 08/17/2013 [facility] Patient Information 2015 [place], [place]. This information is not intended to replace advice given to you by your health care provider. Make sure you discuss any questions you have with your health care provider.       One week nurse visit follow up.  Patient has been scheduled for a colonoscopy on 07-19-15 at [facility]. This patient may continue Mobic and baby aspirin once daily.

## 2015-06-10 ENCOUNTER — Ambulatory Visit (INDEPENDENT_AMBULATORY_CARE_PROVIDER_SITE_OTHER): Payer: 59

## 2015-06-10 ENCOUNTER — Telehealth: Payer: Self-pay

## 2015-06-10 DIAGNOSIS — L988 Other specified disorders of the skin and subcutaneous tissue: Secondary | ICD-10-CM | POA: Insufficient documentation

## 2015-06-10 DIAGNOSIS — N649 Disorder of breast, unspecified: Secondary | ICD-10-CM | POA: Insufficient documentation

## 2015-06-10 DIAGNOSIS — Z1239 Encounter for other screening for malignant neoplasm of breast: Secondary | ICD-10-CM | POA: Insufficient documentation

## 2015-06-10 NOTE — Progress Notes (Signed)
Patient ID: Nicole Wyatt, female   DOB: Oct 09, 1966, 49 y.o.   MRN: [id number] Patient came in for wound check of left breast cyst that was excised on 06/09/15. She states that her dressing was saturated since last night. She changed the dressing this morning around 9 am and states that this dressing became saturated again. Wound looks clean, steri strips have come loose, small amount of dried blood noted. No active bleeding noted. Area cleansed with saline and dried. New steri strips put on and dressing placed. Patient instructed to begin wearing a bra and to use her ice pack several times a day for the next two days. She will call with any further problems. Follow up as scheduled for 06/16/15 for suture removal.

## 2015-06-10 NOTE — H&P (Signed)
Patient ID: Nicole Wyatt, female DOB: December 20, 1966, 49 y.o. MRN: [id number]  Chief Complaint   Patient presents with   .  Colonoscopy    HPI  Nicole Wyatt is a 49 y.o. female here today for a evaluation of a screening colonoscopy. Patient states no GI problems at this time.  Patient states she has a mole on her left breast that has been there for 6 month. Mole has changed in color and starting to drain.  Last mammogram was done on November 2015.  The patient was admitted in February 2016 after she was found on the floor. Impression was likely sleep deprivation. Complete evaluation for TIA was negative. No change medications recommended at that time. HPI  Past Medical History   Diagnosis  Date   .  Depression    .  Bipolar 1 disorder    .  Oxygen dependent    .  Arthritis    .  Seizures  05/2013    Past Surgical History   Procedure  Laterality  Date   .  Abdominal hysterectomy   1999    Family History   Problem  Relation  Age of Onset   .  Hypertension  Mother    .  Hypertension  Father     Social History  History   Substance Use Topics   .  Smoking status:  Former Smoker -- 1.00 packs/day for 14 years     Types:  Cigarettes   .  Smokeless tobacco:  Not on file   .  Alcohol Use:  No    Allergies   Allergen  Reactions   .  Oxycodone  Hives   .  Percocet [Oxycodone-Acetaminophen]  Hives    Current Outpatient Prescriptions   Medication  Sig  Dispense  Refill   .  ALPRAZolam (XANAX) 0.5 MG tablet  Take 0.5 mg by mouth 2 (two) times daily.     Marland Kitchen  aspirin 81 MG tablet  Take 81 mg by mouth daily.     .  furosemide (LASIX) 40 MG tablet  Take 40 mg by mouth daily.     .  hydrOXYzine (ATARAX/VISTARIL) 25 MG tablet  Take 25 mg by mouth 3 (three) times daily as needed.     .  isosorbide dinitrate (ISORDIL) 30 MG tablet  Take 30 mg by mouth daily.     Marland Kitchen  levETIRAcetam (KEPPRA) 1000 MG tablet  Take 1,000 mg by mouth 2 (two) times daily.     .  meloxicam (MOBIC) 15 MG tablet   Take 15 mg by mouth daily.     .  potassium chloride (KLOR-CON) 20 MEQ packet  Take by mouth 2 (two) times daily.     .  risperiDONE (RISPERDAL) 0.5 MG tablet  Take 0.5 mg by mouth at bedtime.     .  sertraline (ZOLOFT) 100 MG tablet  Take 100 mg by mouth daily.     Marland Kitchen  venlafaxine XR (EFFEXOR-XR) 37.5 MG 24 hr capsule  Take 37.5 mg by mouth daily with breakfast.     .  polyethylene glycol powder (GLYCOLAX/MIRALAX) powder  255 grams one bottle for colonoscopy prep  255 g  0    No current facility-administered medications for this visit.    Review of Systems  Review of Systems  Constitutional: Negative.  HENT: Negative.  Eyes: Negative.  Respiratory: Negative.  Cardiovascular: Negative.  Gastrointestinal: Negative.  Endocrine: Negative.  Genitourinary: Negative.  Skin: Negative.  Allergic/Immunologic: Negative.  Blood pressure 130/80, pulse 80, resp. rate 20, height  (1.727 m), weight 295 lb (133.811 kg).  Physical Exam  Physical Exam  Constitutional: She is oriented to person, place, and time. She appears well-developed and well-nourished.  Eyes: Conjunctivae are normal. No scleral icterus.  Neck: Neck supple.  Cardiovascular: Normal rate, regular rhythm and normal heart sounds.  Pulmonary/Chest: Effort normal and breath sounds normal.    1 cm skin cyst left breast. Left breast skin cyst under breast lower inner quadrant  Lymphadenopathy:  She has no cervical adenopathy.  Neurological: She is alert and oriented to person, place, and time.  Skin: Skin is warm and dry.   Data Reviewed  Mammograms dated 10/27/2014 were independently reviewed. Moderately dense breast consistent with age. No focal area of concern. BI-RADS-1.  Assessment   Candidate for screening colonoscopy.  Draining skin lesion of the left breast. Likely sebaceous cyst with chronic infection.   Plan   It was elected to proceed to excision of the symptomatic area in the upper inner quadrant of the  left breast. 10 mL of 0.5% Xylocaine with 0.25% Marcaine with 1-200,000 units of epinephrine was utilized well tolerated. Chlor prep was applied to the skin. An elliptical incision was used to remove the area which was approximately 1 cm in diameter. Hemostasis was with 3-0 Vicryls ties. The deep tissue was approximated with interrupted 3-0 Vicryls sutures. The skin was approximated with interrupted 4-0 nylon sutures and Steri-Strips were applied. Telfa and Tegaderm dressing placed. Postoperative wound care was reviewed.   Colonoscopy with possible biopsy/polypectomy prn: Information regarding the procedure, including its potential risks and complications (including but not limited to perforation of the bowel, which may require emergency surgery to repair, and bleeding) was verbally given to the patient. Educational information regarding lower intestinal endoscopy was given to the patient. Written instructions for how to complete the bowel prep using Miralax were provided. The importance of drinking ample fluids to avoid dehydration as a result of the prep emphasized.  Patient has been scheduled for a colonoscopy on 07-19-15 at [facility]. This patient may continue Mobic and baby aspirin once daily.  Earline Mayotte

## 2015-06-10 NOTE — Telephone Encounter (Signed)
Patient called and said that her dressing was saturated since last night. She changed the dressing this morning around 9 am and states that this dressing is also saturated and she thinks that the stitches may have come loose. Patient instructed to come into the office today to have the nurse look at the area.

## 2015-06-14 ENCOUNTER — Telehealth: Payer: Self-pay

## 2015-06-14 NOTE — Telephone Encounter (Signed)
Notified patient as instructed, patient pleased. Discussed follow-up appointments, patient agrees  

## 2015-06-14 NOTE — Telephone Encounter (Signed)
-----   Message from Earline Mayotte, MD sent at 06/14/2015  3:15 PM EDT ----- Please notify the patient that the pathology on the left chest lesion was benign. F/U for suture removal as planned.  Thanks. ----- Message -----    From: Lab in [facility] Interface    Sent: 06/14/2015   2:24 PM      To: Earline Mayotte, MD

## 2015-06-16 ENCOUNTER — Ambulatory Visit (INDEPENDENT_AMBULATORY_CARE_PROVIDER_SITE_OTHER): Payer: 59

## 2015-06-16 DIAGNOSIS — N649 Disorder of breast, unspecified: Secondary | ICD-10-CM

## 2015-06-16 DIAGNOSIS — L988 Other specified disorders of the skin and subcutaneous tissue: Secondary | ICD-10-CM

## 2015-06-16 NOTE — Progress Notes (Signed)
Patient ID: Nicole Wyatt, female   DOB: [birth year], 49 y.o.   MRN: [id number] Patient came in today for a wound check.  The wound is clean, with no signs of infection noted. Sutures removed, wound edges well approximated. Follow up as scheduled.

## 2015-07-11 ENCOUNTER — Ambulatory Visit
Admission: RE | Admit: 2015-07-11 | Discharge: 2015-07-11 | Disposition: A | Discharge status: Home / Self Care | Payer: 59 | Source: Ambulatory Visit | Attending: Internal Medicine | Admitting: Internal Medicine

## 2015-07-11 ENCOUNTER — Other Ambulatory Visit: Payer: Self-pay | Admitting: Internal Medicine

## 2015-07-11 DIAGNOSIS — J984 Other disorders of lung: Secondary | ICD-10-CM | POA: Diagnosis not present

## 2015-07-11 DIAGNOSIS — F17200 Nicotine dependence, unspecified, uncomplicated: Secondary | ICD-10-CM

## 2015-07-11 DIAGNOSIS — Z720 Tobacco use: Secondary | ICD-10-CM | POA: Insufficient documentation

## 2015-07-13 ENCOUNTER — Telehealth: Payer: Self-pay | Admitting: *Deleted

## 2015-07-13 NOTE — Telephone Encounter (Signed)
Patient confirms no medication changes since last office visit.   We will proceed with colonoscopy that is scheduled at [facility] for 07-19-15. Patient reports she does have Miralax prescription. This patient was instructed to call the office if she has further questions.

## 2015-07-18 MED ORDER — SODIUM CHLORIDE 0.9 % IV SOLN
INTRAVENOUS | Status: DC
  Administered 2015-07-19: 1000 mL via INTRAVENOUS

## 2015-07-19 ENCOUNTER — Ambulatory Visit: Payer: 59 | Admitting: Certified Registered Nurse Anesthetist

## 2015-07-19 ENCOUNTER — Encounter: Payer: Self-pay | Admitting: *Deleted

## 2015-07-19 ENCOUNTER — Encounter
Admission: RE | Disposition: A | Discharge status: Home / Self Care | Payer: Self-pay | Source: Ambulatory Visit | Attending: General Surgery

## 2015-07-19 ENCOUNTER — Ambulatory Visit
Admission: RE | Admit: 2015-07-19 | Discharge: 2015-07-19 | Disposition: A | Discharge status: Home / Self Care | Payer: 59 | Source: Ambulatory Visit | Attending: General Surgery | Admitting: General Surgery

## 2015-07-19 DIAGNOSIS — R569 Unspecified convulsions: Secondary | ICD-10-CM | POA: Diagnosis not present

## 2015-07-19 DIAGNOSIS — Z79899 Other long term (current) drug therapy: Secondary | ICD-10-CM | POA: Insufficient documentation

## 2015-07-19 DIAGNOSIS — Z7982 Long term (current) use of aspirin: Secondary | ICD-10-CM | POA: Insufficient documentation

## 2015-07-19 DIAGNOSIS — Z1211 Encounter for screening for malignant neoplasm of colon: Secondary | ICD-10-CM

## 2015-07-19 DIAGNOSIS — Z6841 Body Mass Index (BMI) 40.0 and over, adult: Secondary | ICD-10-CM | POA: Diagnosis not present

## 2015-07-19 DIAGNOSIS — F319 Bipolar disorder, unspecified: Secondary | ICD-10-CM | POA: Insufficient documentation

## 2015-07-19 DIAGNOSIS — Z87891 Personal history of nicotine dependence: Secondary | ICD-10-CM | POA: Diagnosis not present

## 2015-07-19 DIAGNOSIS — M1990 Unspecified osteoarthritis, unspecified site: Secondary | ICD-10-CM | POA: Insufficient documentation

## 2015-07-19 HISTORY — PX: COLONOSCOPY: SHX5424

## 2015-07-19 SURGERY — COLONOSCOPY
Anesthesia: General

## 2015-07-19 MED ORDER — PROPOFOL INFUSION 10 MG/ML OPTIME
INTRAVENOUS | Status: DC | PRN
  Administered 2015-07-19: 160 ug/kg/min via INTRAVENOUS

## 2015-07-19 MED ORDER — PHENYLEPHRINE HCL 10 MG/ML IJ SOLN
INTRAMUSCULAR | Status: DC | PRN
  Administered 2015-07-19 (×3): 100 ug via INTRAVENOUS

## 2015-07-19 MED ORDER — PROPOFOL 10 MG/ML IV BOLUS
INTRAVENOUS | Status: DC | PRN
  Administered 2015-07-19: 20 mg via INTRAVENOUS
  Administered 2015-07-19: 40 mg via INTRAVENOUS
  Administered 2015-07-19: 20 mg via INTRAVENOUS

## 2015-07-19 MED ORDER — LIDOCAINE HCL (CARDIAC) 20 MG/ML IV SOLN
INTRAVENOUS | Status: DC | PRN
  Administered 2015-07-19: 60 mg via INTRAVENOUS

## 2015-07-19 NOTE — Transfer of Care (Signed)
Immediate Anesthesia Transfer of Care Note  Patient: Nicole Wyatt  Procedure(s) Performed: Procedure(s): COLONOSCOPY (N/A)  Patient Location: PACU  Anesthesia Type:General  Level of Consciousness: awake, alert  and oriented  Airway & Oxygen Therapy: Patient Spontanous Breathing and Patient connected to face mask oxygen  Post-op Assessment: Report given to RN and Post -op Vital signs reviewed and stable  Post vital signs: Reviewed and stable  Last Vitals:  Filed Vitals:   07/19/15 1357  BP: 104/64  Pulse: 64  Temp: 36.3 C  Resp: 18    Complications: No apparent anesthesia complications

## 2015-07-19 NOTE — Op Note (Signed)
[facility] Patient Name: Nicole Wyatt Procedure Date: 07/19/2015 12:46 PM MRN: [id number] Account #: [id number] Date of Birth: 1966-10-08 Admit Type: Outpatient Age: 49 Room: [facility] ENDO ROOM 1 Gender: Female Note Status: Finalized Procedure:         Colonoscopy Indications:       Screening for colorectal malignant neoplasm Providers:         Earline Mayotte, MD Referring MD:      Serita Sheller. Maryellen Pile, MD (Referring MD) Medicines:         Monitored Anesthesia Care Complications:     No immediate complications. Procedure:         Pre-Anesthesia Assessment:                    - Prior to the procedure, a History and Physical was                     performed, and patient medications, allergies and                     sensitivities were reviewed. The patient's tolerance of                     previous anesthesia was reviewed.                    - The risks and benefits of the procedure and the sedation                     options and risks were discussed with the patient. All                     questions were answered and informed consent was obtained.                    After obtaining informed consent, the colonoscope was                     passed under direct vision. Throughout the procedure, the                     patient's blood pressure, pulse, and oxygen saturations                     were monitored continuously. The Colonoscope was                     introduced through the anus and advanced to the the cecum,                     identified by appendiceal orifice and ileocecal valve. The                     colonoscopy was somewhat difficult due to poor endoscopic                     visualization. Successful completion of the procedure was                     aided by changing the patient to a supine position. The                     patient tolerated the procedure well. The quality of the  bowel preparation was fair. Thre  was a large number of                     corn kernals throughout the colon that impaired                     visualization. Findings:      The entire examined colon appeared normal on direct and retroflexion       views. Impression:        - The entire examined colon is normal on direct and                     retroflexion views.                    - No specimens collected. Recommendation:    - Repeat colonoscopy in 10 years for screening purposes. Diagnosis Code(s): --- Professional ---                    Z12.11, Encounter for screening for malignant neoplasm of                     colon Earline Mayotte, MD 07/19/2015 1:51:27 PM This report has been signed electronically. Number of Addenda: 0 Note Initiated On: 07/19/2015 12:46 PM Scope Withdrawal Time: 0 hours 9 minutes 23 seconds  Total Procedure Duration: 0 hours 34 minutes 59 seconds       [facility]

## 2015-07-19 NOTE — Progress Notes (Signed)
Seizure activity noted in RR area. Resolved with 15-20 minute post ictal period. Seen by Tommi Rumps. Thomas from Anesthesia.

## 2015-07-19 NOTE — Anesthesia Preprocedure Evaluation (Signed)
Anesthesia Evaluation  Patient identified by MRN, date of birth, ID band Patient awake    Reviewed: Allergy & Precautions, NPO status , Patient's Chart, lab work & pertinent test results  History of Anesthesia Complications Negative for: history of anesthetic complications  Airway Mallampati: III       Dental  (+) Teeth Intact   Pulmonary COPDformer smoker,    + decreased breath sounds      Cardiovascular hypertension, Pt. on medications Normal cardiovascular exam    Neuro/Psych Seizures -, Well Controlled,  Depression Bipolar Disorder    GI/Hepatic negative GI ROS, Neg liver ROS,   Endo/Other  Morbid obesity  Renal/GU negative Renal ROS     Musculoskeletal  (+) Arthritis -,   Abdominal (+) + obese,   Peds  Hematology   Anesthesia Other Findings   Reproductive/Obstetrics                             Anesthesia Physical Anesthesia Plan  ASA: III  Anesthesia Plan: General   Post-op Pain Management:    Induction: Intravenous  Airway Management Planned: Nasal Cannula  Additional Equipment:   Intra-op Plan:   Post-operative Plan:   Informed Consent: I have reviewed the patients History and Physical, chart, labs and discussed the procedure including the risks, benefits and alternatives for the proposed anesthesia with the patient or authorized representative who has indicated his/her understanding and acceptance.     Plan Discussed with: CRNA  Anesthesia Plan Comments:         Anesthesia Quick Evaluation

## 2015-07-19 NOTE — Anesthesia Procedure Notes (Signed)
Performed by: Milfred Krammes Pre-anesthesia Checklist: Patient identified, Emergency Drugs available, Suction available, Patient being monitored and Timeout performed Patient Re-evaluated:Patient Re-evaluated prior to inductionOxygen Delivery Method: Nasal cannula Intubation Type: IV induction       

## 2015-07-19 NOTE — H&P (Signed)
Nicole Wyatt is an 49 y.o. female.   Chief Complaint: Screening colonoscopy  HPI: No change in clinical history.    Past Medical History  Diagnosis Date  . Depression   . Bipolar 1 disorder   . Oxygen dependent   . Arthritis   . Seizures 05/2013    Past Surgical History  Procedure Laterality Date  . Abdominal hysterectomy  1999    Family History  Problem Relation Age of Onset  . Hypertension Mother   . Hypertension Father    Social History:  reports that she has quit smoking. Her smoking use included Cigarettes. She has a 14 pack-year smoking history. She has never used smokeless tobacco. She reports that she does not drink alcohol or use illicit drugs.  Allergies:  Allergies  Allergen Reactions  . Oxycodone Hives  . Percocet [Oxycodone-Acetaminophen] Hives    Medications Prior to Admission  Medication Sig Dispense Refill  . ALPRAZolam (XANAX) 0.5 MG tablet Take 0.5 mg by mouth 2 (two) times daily.    Marland Kitchen aspirin 81 MG tablet Take 81 mg by mouth daily.    . furosemide (LASIX) 40 MG tablet Take 40 mg by mouth daily.    . hydrOXYzine (ATARAX/VISTARIL) 25 MG tablet Take 25 mg by mouth 3 (three) times daily as needed.    . isosorbide dinitrate (ISORDIL) 30 MG tablet Take 30 mg by mouth daily.    Marland Kitchen levETIRAcetam (KEPPRA) 1000 MG tablet Take 1,000 mg by mouth 2 (two) times daily.    . meloxicam (MOBIC) 15 MG tablet Take 15 mg by mouth daily.    . polyethylene glycol powder (GLYCOLAX/MIRALAX) powder 255 grams one bottle for colonoscopy prep 255 g 0  . potassium chloride (KLOR-CON) 20 MEQ packet Take by mouth 2 (two) times daily.    . sertraline (ZOLOFT) 100 MG tablet Take 100 mg by mouth daily.    Marland Kitchen venlafaxine XR (EFFEXOR-XR) 37.5 MG 24 hr capsule Take 37.5 mg by mouth daily with breakfast.    . risperiDONE (RISPERDAL) 0.5 MG tablet Take 0.5 mg by mouth at bedtime.      No results found for this or any previous visit (from the past 48 hour(s)). No results  found.  ROS  Blood pressure 126/82, pulse 70, temperature 97.4 F (36.3 C), temperature source Axillary, resp. rate 22, height  (1.702 m), weight 291 lb (131.997 kg), SpO2 98 %. Physical Exam  Constitutional: She appears well-developed and well-nourished.  Neck: Neck supple.  Cardiovascular: Normal rate and regular rhythm.   Respiratory: Effort normal and breath sounds normal.     Assessment/Plan Colonoscopy.   Earline Mayotte 07/19/2015, 1:57 PM

## 2015-07-20 NOTE — Anesthesia Postprocedure Evaluation (Signed)
  Anesthesia Post-op Note  Patient: Nicole Wyatt  Procedure(s) Performed: Procedure(s): COLONOSCOPY (N/A)  Anesthesia type:General  Patient location: PACU  Post pain: Pain level controlled  Post assessment: Post-op Vital signs reviewed, Patient's Cardiovascular Status Stable, Respiratory Function Stable, Patent Airway and No signs of Nausea or vomiting  Post vital signs: Reviewed and stable  Last Vitals:  Filed Vitals:   07/19/15 1357  BP: 104/64  Pulse: 64  Temp: 36.3 C  Resp: 18    Level of consciousness: awake, alert  and patient cooperative  Complications: No apparent anesthesia complications

## 2015-07-21 ENCOUNTER — Emergency Department: Payer: 59

## 2015-07-21 ENCOUNTER — Encounter: Payer: Self-pay | Admitting: *Deleted

## 2015-07-21 ENCOUNTER — Emergency Department
Admission: EM | Admit: 2015-07-21 | Discharge: 2015-07-21 | Disposition: A | Discharge status: Home / Self Care | Payer: 59 | Attending: Emergency Medicine | Admitting: Emergency Medicine

## 2015-07-21 DIAGNOSIS — Z79899 Other long term (current) drug therapy: Secondary | ICD-10-CM | POA: Insufficient documentation

## 2015-07-21 DIAGNOSIS — Z87891 Personal history of nicotine dependence: Secondary | ICD-10-CM | POA: Insufficient documentation

## 2015-07-21 DIAGNOSIS — R51 Headache: Secondary | ICD-10-CM | POA: Diagnosis present

## 2015-07-21 DIAGNOSIS — M6281 Muscle weakness (generalized): Secondary | ICD-10-CM | POA: Insufficient documentation

## 2015-07-21 DIAGNOSIS — Z791 Long term (current) use of non-steroidal anti-inflammatories (NSAID): Secondary | ICD-10-CM | POA: Diagnosis not present

## 2015-07-21 DIAGNOSIS — G43909 Migraine, unspecified, not intractable, without status migrainosus: Secondary | ICD-10-CM | POA: Diagnosis not present

## 2015-07-21 DIAGNOSIS — Z7982 Long term (current) use of aspirin: Secondary | ICD-10-CM | POA: Insufficient documentation

## 2015-07-21 LAB — COMPREHENSIVE METABOLIC PANEL
ALT: 27 U/L (ref 14–54)
AST: 28 U/L (ref 15–41)
Albumin: 3.9 g/dL (ref 3.5–5.0)
Alkaline Phosphatase: 94 U/L (ref 38–126)
Anion gap: 7 (ref 5–15)
BUN: 10 mg/dL (ref 6–20)
CALCIUM: 8.6 mg/dL — AB (ref 8.9–10.3)
CO2: 26 mmol/L (ref 22–32)
CREATININE: 0.68 mg/dL (ref 0.44–1.00)
Chloride: 105 mmol/L (ref 101–111)
GFR calc non Af Amer: >60 mL/min (ref >60)
Glucose, Bld: 91 mg/dL (ref 65–99)
Potassium: 3.9 mmol/L (ref 3.5–5.1)
Sodium: 138 mmol/L (ref 135–145)
Total Bilirubin: 0.4 mg/dL (ref 0.3–1.2)
Total Protein: 7.9 g/dL (ref 6.5–8.1)

## 2015-07-21 LAB — TROPONIN I: Troponin I: <0.03 ng/mL (ref <0.031)

## 2015-07-21 LAB — CBC
HEMATOCRIT: 36.8 % (ref 35.0–47.0)
Hemoglobin: 12.2 g/dL (ref 12.0–16.0)
MCH: 29.8 pg (ref 26.0–34.0)
MCHC: 33.3 g/dL (ref 32.0–36.0)
MCV: 89.4 fL (ref 80.0–100.0)
Platelets: 243 10*3/uL (ref 150–440)
RBC: 4.11 MIL/uL (ref 3.80–5.20)
RDW: 17.1 % — ABNORMAL HIGH (ref 11.5–14.5)
WBC: 4.8 10*3/uL (ref 3.6–11.0)

## 2015-07-21 MED ORDER — DIPHENHYDRAMINE HCL 50 MG/ML IJ SOLN
50.0000 mg | Freq: Once | INTRAMUSCULAR | Status: AC
  Administered 2015-07-21: 50 mg via INTRAVENOUS
  Filled 2015-07-21: qty 1

## 2015-07-21 MED ORDER — KETOROLAC TROMETHAMINE 30 MG/ML IJ SOLN
30.0000 mg | Freq: Once | INTRAMUSCULAR | Status: AC
  Administered 2015-07-21: 30 mg via INTRAVENOUS
  Filled 2015-07-21: qty 1

## 2015-07-21 MED ORDER — SODIUM CHLORIDE 0.9 % IV BOLUS (SEPSIS)
1000.0000 mL | Freq: Once | INTRAVENOUS | Status: AC
  Administered 2015-07-21: 1000 mL via INTRAVENOUS

## 2015-07-21 MED ORDER — METOCLOPRAMIDE HCL 5 MG/ML IJ SOLN
10.0000 mg | Freq: Once | INTRAMUSCULAR | Status: AC
  Administered 2015-07-21: 10 mg via INTRAVENOUS
  Filled 2015-07-21: qty 2

## 2015-07-21 NOTE — ED Provider Notes (Signed)
[facility] Emergency Department Provider Note  Time seen: 2:00 PM  I have reviewed the triage vital signs and the nursing notes.   HISTORY  Chief Complaint Headache    HPI Nicole Wyatt is a 49 y.o. female with a past medical history of migraines, arthritis, seizure disorder, bipolar disorder who presents the emergency department with a headache, pain around her left eye, and some weakness of her left arm. According to the patient she had a colonoscopy performed 2 days ago. After her colonoscopy the patient suffered a seizure hitting the left side of her face on the stretcher rail. At that time the patient has noticed some left-sided facial pain which has progressed into a migraine per the patient. She also noticed some left arm weakness. The patient states the left arm weakness is fairly normal for her and worsens during migraines or after a seizure. Denies any new weakness. Patient denies any fever, vomiting. Describes her headache currently as global and frontal. Also describing pain behind the left eye. Describes her headache as moderate currently.     Past Medical History  Diagnosis Date  . Depression   . Bipolar 1 disorder   . Oxygen dependent   . Arthritis   . Seizures 05/2013    Patient Active Problem List   Diagnosis Date Noted  . Encounter for screening colonoscopy 06/10/2015  . Skin lesion of breast 06/10/2015    Past Surgical History  Procedure Laterality Date  . Abdominal hysterectomy  1999  . Colonoscopy      Current Outpatient Rx  Name  Route  Sig  Dispense  Refill  . aspirin 81 MG tablet   Oral   Take 81 mg by mouth daily.         . furosemide (LASIX) 40 MG tablet   Oral   Take 40 mg by mouth daily.         . isosorbide dinitrate (ISORDIL) 30 MG tablet   Oral   Take 30 mg by mouth daily.         Marland Kitchen levETIRAcetam (KEPPRA) 1000 MG tablet   Oral   Take 3,000 mg by mouth daily.          . meloxicam (MOBIC) 15 MG  tablet   Oral   Take 15 mg by mouth daily.         . potassium chloride (KLOR-CON) 20 MEQ packet   Oral   Take by mouth daily.          . sertraline (ZOLOFT) 100 MG tablet   Oral   Take 100 mg by mouth daily.         Marland Kitchen venlafaxine XR (EFFEXOR-XR) 37.5 MG 24 hr capsule   Oral   Take 37.5 mg by mouth daily with breakfast.         . polyethylene glycol powder (GLYCOLAX/MIRALAX) powder      255 grams one bottle for colonoscopy prep   255 g   0     Allergies Oxycodone and Percocet  Family History  Problem Relation Age of Onset  . Hypertension Mother   . Hypertension Father     Social History History  Substance Use Topics  . Smoking status: Former Smoker -- 1.00 packs/day for 14 years    Types: Cigarettes  . Smokeless tobacco: Never Used  . Alcohol Use: No    Review of Systems Constitutional: Negative for fever. Cardiovascular: Negative for chest pain. Respiratory: Negative for shortness of breath. Gastrointestinal:  Negative for abdominal pain Musculoskeletal: Negative for back pain Neurological: Positive for headache. Positive for mild left arm weakness which the patient states is typical for her during a headache. 10-point ROS otherwise negative.  ____________________________________________   PHYSICAL EXAM:  VITAL SIGNS: ED Triage Vitals  Enc Vitals Group     BP 07/21/15 1114 153/95 mmHg     Pulse Rate 07/21/15 1114 80     Resp 07/21/15 1114 97     Temp 07/21/15 1114 98.6 F (37 C)     Temp Source 07/21/15 1114 Oral     SpO2 07/21/15 1114 96 %     Weight 07/21/15 1114 291 lb (131.997 kg)     Height 07/21/15 1114 5\' 7"  (1.702 m)     Head Cir --      Peak Flow --      Pain Score 07/21/15 1115 9     Pain Loc --      Pain Edu? --      Excl. in GC? --     Constitutional: Alert and oriented. Well appearing and in no distress. Eyes: Normal exam, 2-3 mm bilaterally and PERRL ENT   Head: Normocephalic. Mild tenderness to palpation above  the left eye.   Nose: No congestion/rhinnorhea.   Mouth/Throat: Mucous membranes are moist. Cardiovascular: Normal rate, regular rhythm. No murmur Respiratory: Normal respiratory effort without tachypnea nor retractions. Breath sounds are clear and equal bilaterally. No wheezes/rales/rhonchi. Gastrointestinal: Soft and nontender. No distention.   Musculoskeletal: Nontender with normal range of motion in all extremities. Neurologic:  Normal speech and language. Patient has 4+ motor in the left upper extremity, 5/5 motor in all other extremities. Sensation intact and equal in all extremities. Patient has no pronator drift. No facial droop, cranial nerves intact. Patient states her left arm weakness is fairly typical for her during headaches or after seizures. Skin:  Skin is warm, dry and intact.  Psychiatric: Mood and affect are normal. Speech and behavior are normal.  ____________________________________________    RADIOLOGY  CT normal  ____________________________________________    INITIAL IMPRESSION / ASSESSMENT AND PLAN / ED COURSE  Pertinent labs & imaging results that were available during my care of the patient were reviewed by me and considered in my medical decision making (see chart for details).  Patient presents to the emergency department with a headache, elevated blood pressure, facial pain, and left arm weakness. Patient states the left arm weakness is normal for her following a seizure ordering a headache. States a history of migraines in the past. Patient is concerned because after her colonoscopy during a seizure she hit her head on the bed railing which is what she is bleeding triggered today's migraine. CT is negative, labs are normal. Patient states her headache is better after treatment, and her left arm although mildly weak still she states is improved. I again asked her about this mild weakness noted in the left arm, she says it is normal, her mother is here  with her and states that it is normal as well. We will discharge the patient home with primary care follow-up. Patient agreeable plan. ____________________________________________   FINAL CLINICAL IMPRESSION(S) / ED DIAGNOSES  Migraine headache   Minna Antis, MD 07/21/15 [phone]

## 2015-07-21 NOTE — ED Notes (Signed)
2 days ago had colonscopy, her b/p dropped, was told she had seizure, next day developed headache, pressure in left eye, has hx of seizures, last pm states she has been feeling some twitches in her face

## 2015-07-21 NOTE — Discharge Instructions (Signed)
Please follow-up with her primary care doctor tomorrow regarding your migraine headache. Return to the emergency department for any worsening headache, confusion, slurred speech, or any increased weakness, or any numbness of any arm or leg.      Migraine Headache A migraine headache is an intense, throbbing pain on one or both sides of your head. A migraine can last for 30 minutes to several hours. CAUSES  The exact cause of a migraine headache is not always known. However, a migraine may be caused when nerves in the brain become irritated and release chemicals that cause inflammation. This causes pain. Certain things may also trigger migraines, such as:  Alcohol.  Smoking.  Stress.  Menstruation.  Aged cheeses.  Foods or drinks that contain nitrates, glutamate, aspartame, or tyramine.  Lack of sleep.  Chocolate.  Caffeine.  Hunger.  Physical exertion.  Fatigue.  Medicines used to treat chest pain (nitroglycerine), birth control pills, estrogen, and some blood pressure medicines. SIGNS AND SYMPTOMS  Pain on one or both sides of your head.  Pulsating or throbbing pain.  Severe pain that prevents daily activities.  Pain that is aggravated by any physical activity.  Nausea, vomiting, or both.  Dizziness.  Pain with exposure to bright lights, loud noises, or activity.  General sensitivity to bright lights, loud noises, or smells. Before you get a migraine, you may get warning signs that a migraine is coming (aura). An aura may include:  Seeing flashing lights.  Seeing bright spots, halos, or zigzag lines.  Having tunnel vision or blurred vision.  Having feelings of numbness or tingling.  Having trouble talking.  Having muscle weakness. DIAGNOSIS  A migraine headache is often diagnosed based on:  Symptoms.  Physical exam.  A CT scan or MRI of your head. These imaging tests cannot diagnose migraines, but they can help rule out other causes of  headaches. TREATMENT Medicines may be given for pain and nausea. Medicines can also be given to help prevent recurrent migraines.  HOME CARE INSTRUCTIONS  Only take over-the-counter or prescription medicines for pain or discomfort as directed by your health care provider. The use of long-term narcotics is not recommended.  Lie down in a dark, quiet room when you have a migraine.  Keep a journal to find out what may trigger your migraine headaches. For example, write down:  What you eat and drink.  How much sleep you get.  Any change to your diet or medicines.  Limit alcohol consumption.  Quit smoking if you smoke.  Get 7-9 hours of sleep, or as recommended by your health care provider.  Limit stress.  Keep lights dim if bright lights bother you and make your migraines worse. SEEK IMMEDIATE MEDICAL CARE IF:   Your migraine becomes severe.  You have a fever.  You have a stiff neck.  You have vision loss.  You have muscular weakness or loss of muscle control.  You start losing your balance or have trouble walking.  You feel faint or pass out.  You have severe symptoms that are different from your first symptoms. MAKE SURE YOU:   Understand these instructions.  Will watch your condition.  Will get help right away if you are not doing well or get worse. Document Released: 12/10/2005 Document Revised: 04/26/2014 Document Reviewed: 08/17/2013 [facility] Patient Information 2015 [place], [place]. This information is not intended to replace advice given to you by your health care provider. Make sure you discuss any questions you have with your health care  provider.

## 2015-07-22 ENCOUNTER — Encounter: Payer: Self-pay | Admitting: General Surgery

## 2015-07-23 ENCOUNTER — Emergency Department
Admission: EM | Admit: 2015-07-23 | Discharge: 2015-07-23 | Disposition: A | Discharge status: Home / Self Care | Payer: 59 | Attending: Emergency Medicine | Admitting: Emergency Medicine

## 2015-07-23 ENCOUNTER — Emergency Department: Payer: 59

## 2015-07-23 DIAGNOSIS — Z79899 Other long term (current) drug therapy: Secondary | ICD-10-CM | POA: Insufficient documentation

## 2015-07-23 DIAGNOSIS — Z87891 Personal history of nicotine dependence: Secondary | ICD-10-CM | POA: Insufficient documentation

## 2015-07-23 DIAGNOSIS — F3130 Bipolar disorder, current episode depressed, mild or moderate severity, unspecified: Secondary | ICD-10-CM | POA: Insufficient documentation

## 2015-07-23 DIAGNOSIS — Z791 Long term (current) use of non-steroidal anti-inflammatories (NSAID): Secondary | ICD-10-CM | POA: Diagnosis not present

## 2015-07-23 DIAGNOSIS — R110 Nausea: Secondary | ICD-10-CM | POA: Diagnosis not present

## 2015-07-23 DIAGNOSIS — L089 Local infection of the skin and subcutaneous tissue, unspecified: Secondary | ICD-10-CM | POA: Insufficient documentation

## 2015-07-23 DIAGNOSIS — Z7982 Long term (current) use of aspirin: Secondary | ICD-10-CM | POA: Diagnosis not present

## 2015-07-23 LAB — CBC WITH DIFFERENTIAL/PLATELET
Basophils Absolute: 0 10*3/uL (ref 0–0.1)
Basophils Relative: 1 %
Eosinophils Absolute: 0.1 10*3/uL (ref 0–0.7)
Eosinophils Relative: 2 %
HEMATOCRIT: 38.1 % (ref 35.0–47.0)
Hemoglobin: 12.6 g/dL (ref 12.0–16.0)
LYMPHS ABS: 1.8 10*3/uL (ref 1.0–3.6)
Lymphocytes Relative: 40 %
MCH: 29.5 pg (ref 26.0–34.0)
MCHC: 33.1 g/dL (ref 32.0–36.0)
MCV: 88.9 fL (ref 80.0–100.0)
MONO ABS: 0.5 10*3/uL (ref 0.2–0.9)
Monocytes Relative: 10 %
NEUTROS PCT: 47 %
Neutro Abs: 2.2 10*3/uL (ref 1.4–6.5)
Platelets: 254 10*3/uL (ref 150–440)
RBC: 4.29 MIL/uL (ref 3.80–5.20)
RDW: 17.1 % — ABNORMAL HIGH (ref 11.5–14.5)
WBC: 4.6 10*3/uL (ref 3.6–11.0)

## 2015-07-23 LAB — BASIC METABOLIC PANEL
ANION GAP: 8 (ref 5–15)
BUN: 12 mg/dL (ref 6–20)
CALCIUM: 8.8 mg/dL — AB (ref 8.9–10.3)
CO2: 25 mmol/L (ref 22–32)
Chloride: 106 mmol/L (ref 101–111)
Creatinine, Ser: 0.74 mg/dL (ref 0.44–1.00)
GFR calc Af Amer: >60 mL/min (ref >60)
GFR calc non Af Amer: >60 mL/min (ref >60)
GLUCOSE: 98 mg/dL (ref 65–99)
Potassium: 3.9 mmol/L (ref 3.5–5.1)
SODIUM: 139 mmol/L (ref 135–145)

## 2015-07-23 MED ORDER — CLINDAMYCIN HCL 150 MG PO CAPS: 300.0000 mg | ORAL_CAPSULE | Freq: Four times a day (QID) | ORAL | Status: DC

## 2015-07-23 MED ORDER — CLINDAMYCIN HCL 150 MG PO CAPS
300.0000 mg | ORAL_CAPSULE | Freq: Once | ORAL | Status: AC
  Administered 2015-07-23: 300 mg via ORAL

## 2015-07-23 MED ORDER — IBUPROFEN 800 MG PO TABS: 800.0000 mg | ORAL_TABLET | Freq: Three times a day (TID) | ORAL | Status: DC

## 2015-07-23 MED ORDER — CLINDAMYCIN HCL 150 MG PO CAPS
ORAL_CAPSULE | ORAL | Status: AC
  Administered 2015-07-23: 300 mg via ORAL
  Filled 2015-07-23: qty 2

## 2015-07-23 NOTE — ED Notes (Signed)
Pt c/o swollen, puss filled area between to 4th and 5th toe for the past 2-3 days.the patient also c/o swelling to the left side of face this morning.Marland Kitchen

## 2015-07-23 NOTE — ED Provider Notes (Signed)
[facility] Emergency Department Provider Note ____________________________________________  Time seen: 11:51 AM  I have reviewed the triage vital signs and the nursing notes.   HISTORY  Chief Complaint Wound Infection   HPI Nicole Wyatt is a 49 y.o. female is here today with complaint of infection between her right third and fourth toe for approximately 2-3 days.She states she has some nausea last night and some chills but is unaware of any fever. She is unaware of any injury to her toes. She states she does not know that she is diabetic and is unaware of any high blood sugars. Patient was seen in the emergency room 2 days ago with reported seizure activity but did not mention the infection on that visit. 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 Patient is very anxious about getting another bone infection. Currently she reports her pain as being an 8 out of 10. She reports her only health problem as being seizure disorder however records show that she is also has a bipolar disorder.   Past Medical History  Diagnosis Date  . Depression   . Bipolar 1 disorder   . Oxygen dependent   . Arthritis   . Seizures 05/2013    Patient Active Problem List   Diagnosis Date Noted  . Encounter for screening colonoscopy 06/10/2015  . Skin lesion of breast 06/10/2015    Past Surgical History  Procedure Laterality Date  . Abdominal hysterectomy  1999  . Colonoscopy    . Colonoscopy N/A 07/19/2015    Procedure: COLONOSCOPY;  Surgeon: Earline Mayotte, MD;  Location: [facility] ENDOSCOPY;  Service: Endoscopy;  Laterality: N/A;    Current Outpatient Rx  Name  Route  Sig  Dispense  Refill  . aspirin 81 MG tablet   Oral   Take 81 mg by mouth daily.         . clindamycin (CLEOCIN) 150 MG capsule   Oral   Take 2 capsules (300 mg total) by mouth 4 (four) times  daily.   56 capsule   0   . furosemide (LASIX) 40 MG tablet   Oral   Take 40 mg by mouth daily.         Marland Kitchen ibuprofen (ADVIL,MOTRIN) 800 MG tablet   Oral   Take 1 tablet (800 mg total) by mouth 3 (three) times daily.   30 tablet   0   . isosorbide dinitrate (ISORDIL) 30 MG tablet   Oral   Take 30 mg by mouth daily.         Marland Kitchen levETIRAcetam (KEPPRA) 1000 MG tablet   Oral   Take 3,000 mg by mouth daily.          . meloxicam (MOBIC) 15 MG tablet   Oral   Take 15 mg by mouth daily.         . polyethylene glycol powder (GLYCOLAX/MIRALAX) powder      255 grams one bottle for colonoscopy prep   255 g   0   . potassium chloride (KLOR-CON) 20 MEQ packet   Oral   Take by mouth daily.          . sertraline (ZOLOFT) 100 MG tablet   Oral   Take 100 mg by mouth daily.         Marland Kitchen venlafaxine XR (EFFEXOR-XR) 37.5 MG 24 hr capsule   Oral  Take 37.5 mg by mouth daily with breakfast.           Allergies Oxycodone and Percocet  Family History  Problem Relation Age of Onset  . Hypertension Mother   . Hypertension Father     Social History History  Substance Use Topics  . Smoking status: Former Smoker -- 1.00 packs/day for 14 years    Types: Cigarettes  . Smokeless tobacco: Never Used  . Alcohol Use: No    Constitutional: Questionable fever, positive chills. Review of Systems Eyes: No visual changes. Cardiovascular: Denies chest pain. Respiratory: Denies shortness of breath. Gastrointestinal: No abdominal pain.  Positive nausea, no vomiting.  Genitourinary: Negative for dysuria. Musculoskeletal: Negative for back pain. Skin: Negative for rash. "Pus right foot between toes". Neurological: Negative for headaches, focal weakness or numbness. Psychiatric:Bipolar depression 10-point ROS otherwise negative.  ____________________________________________   PHYSICAL EXAM:  VITAL SIGNS: ED Triage Vitals  Enc Vitals Group     BP 07/23/15 1141 129/96  mmHg     Pulse Rate 07/23/15 1138 99     Resp 07/23/15 1138 18     Temp 07/23/15 1138 98.3 F (36.8 C)     Temp Source 07/23/15 1138 Oral     SpO2 07/23/15 1138 99 %     Weight 07/23/15 1138 291 lb (131.997 kg)     Height 07/23/15 1138  (1.676 m)     Head Cir --      Peak Flow --      Pain Score 07/23/15 1139 8     Pain Loc --      Pain Edu? --      Excl. in GC? --     Constitutional: Alert and oriented. Well appearing and in no acute distress. Eyes: Conjunctivae are normal. PERRL. EOMI. Head: Atraumatic. Nose: No congestion/rhinnorhea.  Neck: No stridor.   Cardiovascular: Normal rate, regular rhythm. Grossly normal heart sounds.  Good peripheral circulation. Respiratory: Normal respiratory effort.  No retractions. Lungs CTAB. Gastrointestinal: Soft and nontender. No distention. No abdominal bruits. No CVA tenderness. Musculoskeletal: Right foot third and fourth digits no gross deformity. Range of motion is without restriction. Motor sensory function intact. No lower extremity tenderness nor edema.  No joint effusions. Neurologic:  Normal speech and language. No gross focal neurologic deficits are appreciated. No gait instability. Skin:  Skin is warm, dry and intact. There is a small reddened area between the third and fourth digit webspace. There is no active drainage at this time. Area appears to be open at this time. No surrounding cellulitis was seen. Psychiatric: Mood and affect are normal. Speech and behavior are normal.  ____________________________________________   LABS (all labs ordered are listed, but only abnormal results are displayed)  Labs Reviewed  CBC WITH DIFFERENTIAL/PLATELET - Abnormal; Notable for the following:    RDW 17.1 (*)    All other components within normal limits  BASIC METABOLIC PANEL - Abnormal; Notable for the following:    Calcium 8.8 (*)    All other components within normal limits    ____________________________________________  RADIOLOGY  X-ray of the right foot per radiologist shows a lucency involving the head of the fifth metatarsal with some soft tissue swelling. I, Tommi Rumps, personally viewed and evaluated these images as part of my medical decision making.  ____________________________________________   PROCEDURES  Procedure(s) performed: None  Critical Care performed: No  ____________________________________________   INITIAL IMPRESSION / ASSESSMENT AND PLAN / ED COURSE  Pertinent labs & imaging  results that were available during my care of the patient were reviewed by me and considered in my medical decision making (see chart for details).  Patient was placed on clindamycin for infection and she is to follow-up with her doctor that she has an appointment with on Monday. The questionable lucency near seen at the head of the fifth metatarsal is not where the infection is seen. White count was elevated and patient is afebrile. Patient was warned that if there is any increased swelling or fever she is return to the emergency room immediately given her past history. Patient was given clindamycin by mouth prior to leaving the emergency room is his questionable if her pharmacy is open at this time. ____________________________________________   FINAL CLINICAL IMPRESSION(S) / ED DIAGNOSES  Final diagnoses:  Skin infection  right web space 3rd and 4th toes    Tommi Rumps, PA-C 07/23/15 1455  Emily Filbert, MD 07/23/15 [phone]

## 2015-07-23 NOTE — Discharge Instructions (Signed)
KEEP YOUR APPOINTMENT WITH DR. Maryellen Pile ON Monday START CLEOCIN TODAY AND IBUPROFEN IF NEEDED FOR PAIN WARM COMPRESSES TO FOOT OR SOAK IN WARM WATER FREQUENTLY NEXT 2 DAYS RETURN TO ER IF ANY SEVERE WORSENING OF YOUR SYMPTOMS

## 2015-09-23 ENCOUNTER — Other Ambulatory Visit: Payer: Self-pay

## 2015-09-23 ENCOUNTER — Emergency Department
Admission: EM | Admit: 2015-09-23 | Discharge: 2015-09-23 | Disposition: A | Discharge status: Home / Self Care | Payer: 59 | Attending: Emergency Medicine | Admitting: Emergency Medicine

## 2015-09-23 ENCOUNTER — Encounter: Payer: Self-pay | Admitting: *Deleted

## 2015-09-23 ENCOUNTER — Emergency Department: Payer: 59

## 2015-09-23 DIAGNOSIS — Z79899 Other long term (current) drug therapy: Secondary | ICD-10-CM | POA: Insufficient documentation

## 2015-09-23 DIAGNOSIS — Z3202 Encounter for pregnancy test, result negative: Secondary | ICD-10-CM | POA: Insufficient documentation

## 2015-09-23 DIAGNOSIS — Z792 Long term (current) use of antibiotics: Secondary | ICD-10-CM | POA: Insufficient documentation

## 2015-09-23 DIAGNOSIS — R1110 Vomiting, unspecified: Secondary | ICD-10-CM | POA: Insufficient documentation

## 2015-09-23 DIAGNOSIS — G40309 Generalized idiopathic epilepsy and epileptic syndromes, not intractable, without status epilepticus: Secondary | ICD-10-CM | POA: Diagnosis not present

## 2015-09-23 DIAGNOSIS — Z7982 Long term (current) use of aspirin: Secondary | ICD-10-CM | POA: Diagnosis not present

## 2015-09-23 DIAGNOSIS — F3131 Bipolar disorder, current episode depressed, mild: Secondary | ICD-10-CM | POA: Insufficient documentation

## 2015-09-23 DIAGNOSIS — Z791 Long term (current) use of non-steroidal anti-inflammatories (NSAID): Secondary | ICD-10-CM | POA: Diagnosis not present

## 2015-09-23 DIAGNOSIS — Z87891 Personal history of nicotine dependence: Secondary | ICD-10-CM | POA: Insufficient documentation

## 2015-09-23 DIAGNOSIS — M542 Cervicalgia: Secondary | ICD-10-CM | POA: Insufficient documentation

## 2015-09-23 DIAGNOSIS — R51 Headache: Secondary | ICD-10-CM | POA: Insufficient documentation

## 2015-09-23 DIAGNOSIS — R569 Unspecified convulsions: Secondary | ICD-10-CM | POA: Diagnosis present

## 2015-09-23 HISTORY — DX: Chronic obstructive pulmonary disease, unspecified: J44.9

## 2015-09-23 LAB — CBC WITH DIFFERENTIAL/PLATELET
BASOS ABS: 0 10*3/uL (ref 0–0.1)
Basophils Relative: 1 %
Eosinophils Absolute: 0.1 10*3/uL (ref 0–0.7)
Eosinophils Relative: 2 %
HEMATOCRIT: 39.1 % (ref 35.0–47.0)
HEMOGLOBIN: 13.2 g/dL (ref 12.0–16.0)
LYMPHS PCT: 47 %
Lymphs Abs: 2.7 10*3/uL (ref 1.0–3.6)
MCH: 29.8 pg (ref 26.0–34.0)
MCHC: 33.6 g/dL (ref 32.0–36.0)
MCV: 88.5 fL (ref 80.0–100.0)
MONO ABS: 0.5 10*3/uL (ref 0.2–0.9)
MONOS PCT: 9 %
NEUTROS ABS: 2.3 10*3/uL (ref 1.4–6.5)
NEUTROS PCT: 41 %
Platelets: 253 10*3/uL (ref 150–440)
RBC: 4.42 MIL/uL (ref 3.80–5.20)
RDW: 18.4 % — AB (ref 11.5–14.5)
WBC: 5.7 10*3/uL (ref 3.6–11.0)

## 2015-09-23 LAB — COMPREHENSIVE METABOLIC PANEL
ALT: 24 U/L (ref 14–54)
AST: 28 U/L (ref 15–41)
Albumin: 4.1 g/dL (ref 3.5–5.0)
Alkaline Phosphatase: 103 U/L (ref 38–126)
Anion gap: 4 — ABNORMAL LOW (ref 5–15)
BUN: 20 mg/dL (ref 6–20)
CHLORIDE: 107 mmol/L (ref 101–111)
CO2: 26 mmol/L (ref 22–32)
CREATININE: 0.66 mg/dL (ref 0.44–1.00)
Calcium: 8.9 mg/dL (ref 8.9–10.3)
GFR calc Af Amer: >60 mL/min (ref >60)
GFR calc non Af Amer: >60 mL/min (ref >60)
Glucose, Bld: 101 mg/dL — ABNORMAL HIGH (ref 65–99)
Potassium: 3.6 mmol/L (ref 3.5–5.1)
SODIUM: 137 mmol/L (ref 135–145)
Total Bilirubin: 0.4 mg/dL (ref 0.3–1.2)
Total Protein: 7.9 g/dL (ref 6.5–8.1)

## 2015-09-23 LAB — URINALYSIS COMPLETE WITH MICROSCOPIC (ARMC ONLY)
BACTERIA UA: NONE SEEN
Bilirubin Urine: NEGATIVE
GLUCOSE, UA: NEGATIVE mg/dL
LEUKOCYTES UA: NEGATIVE
Nitrite: NEGATIVE
PH: 5 (ref 5.0–8.0)
PROTEIN: NEGATIVE mg/dL
SPECIFIC GRAVITY, URINE: 1.029 (ref 1.005–1.030)

## 2015-09-23 LAB — URINE DRUG SCREEN, QUALITATIVE (ARMC ONLY)
Amphetamines, Ur Screen: NOT DETECTED
BARBITURATES, UR SCREEN: NOT DETECTED
BENZODIAZEPINE, UR SCRN: NOT DETECTED
Cannabinoid 50 Ng, Ur ~~LOC~~: NOT DETECTED
Cocaine Metabolite,Ur ~~LOC~~: NOT DETECTED
MDMA (Ecstasy)Ur Screen: NOT DETECTED
METHADONE SCREEN, URINE: NOT DETECTED
Opiate, Ur Screen: NOT DETECTED
Phencyclidine (PCP) Ur S: NOT DETECTED
TRICYCLIC, UR SCREEN: POSITIVE — AB

## 2015-09-23 LAB — TROPONIN I

## 2015-09-23 LAB — ETHANOL: Alcohol, Ethyl (B): <5 mg/dL (ref <5)

## 2015-09-23 LAB — PREGNANCY, URINE: PREG TEST UR: NEGATIVE

## 2015-09-23 NOTE — ED Notes (Signed)
Pt to ED via EMS in postictal state following seizure this afternoon. Pt with hx of seizures, currently taking Keppra. Per EMS pt c/c of neck/head pain and chest pain. EMS states pt vomited, given  zofran IM en route. On arrival pt still lethargic/postitical, but responses to verbal stimuli and answers questions appropriately. Vitals wnl, MD Paduchowski at bedside.

## 2015-09-23 NOTE — ED Notes (Signed)
Patient transported to CT 

## 2015-09-23 NOTE — ED Provider Notes (Signed)
[facility] Emergency Department Nicole Wyatt Note  Time seen: 5:17 PM  I have reviewed the triage vital signs and the nursing notes.   HISTORY  Chief Complaint Seizures    HPI Nicole Wyatt is a 49 y.o. female with a past medical history of depression, bipolar, arthritis, seizure disorder, COPD presents the emergency department after likely seizure. According to report the patient had a generalized tonic-clonic seizure, and fell to the ground. After her seizure she was postictal, vomited, somnolent and EMS was called. EMS states upon arrival patient did vomit and they dosed 4 mg of Zofran, was alert, but disoriented and somnolent. Upon arrival patient is much more alert and able to answer questions, follow commands, but does appear somnolent/fatigue. Patient's only complaints currently are head and neck pain. Denies missing any doses of her antiepileptics. Describes her head and neck pain as moderate. Somewhat worse with movement of her head and neck.   Past Medical History  Diagnosis Date  . Depression   . Bipolar 1 disorder   . Oxygen dependent   . Arthritis   . Seizures 05/2013  . COPD (chronic obstructive pulmonary disease)     Patient Active Problem List   Diagnosis Date Noted  . Encounter for screening colonoscopy 06/10/2015  . Skin lesion of breast 06/10/2015    Past Surgical History  Procedure Laterality Date  . Abdominal hysterectomy  1999  . Colonoscopy    . Colonoscopy N/A 07/19/2015    Procedure: COLONOSCOPY;  Surgeon: Earline Mayotte, MD;  Location[facility] ENDOSCOPY;  Service: Endoscopy;  Laterality: N/A;    Current Outpatient Rx  Name  Route  Sig  Dispense  Refill  . aspirin 81 MG tablet   Oral   Take 81 mg by mouth daily.         . clindamycin (CLEOCIN) 150 MG capsule   Oral   Take 2 capsules (300 mg total) by mouth 4 (four) times daily.   56 capsule   0   . furosemide (LASIX) 40 MG tablet   Oral   Take 40 mg by mouth  daily.         Marland Kitchen ibuprofen (ADVIL,MOTRIN) 800 MG tablet   Oral   Take 1 tablet (800 mg total) by mouth 3 (three) times daily.   30 tablet   0   . isosorbide dinitrate (ISORDIL) 30 MG tablet   Oral   Take 30 mg by mouth daily.         Marland Kitchen levETIRAcetam (KEPPRA) 1000 MG tablet   Oral   Take 3,000 mg by mouth daily.          . meloxicam (MOBIC) 15 MG tablet   Oral   Take 15 mg by mouth daily.         . polyethylene glycol powder (GLYCOLAX/MIRALAX) powder      255 grams one bottle for colonoscopy prep   255 g   0   . potassium chloride (KLOR-CON) 20 MEQ packet   Oral   Take by mouth daily.          . sertraline (ZOLOFT) 100 MG tablet   Oral   Take 100 mg by mouth daily.         Marland Kitchen venlafaxine XR (EFFEXOR-XR) 37.5 MG 24 hr capsule   Oral   Take 37.5 mg by mouth daily with breakfast.           Allergies Oxycodone and Percocet  Family History  Problem Relation  Age of Onset  . Hypertension Mother   . Hypertension Father     Social History Social History  Substance Use Topics  . Smoking status: Former Smoker -- 1.00 packs/day for 14 years    Types: Cigarettes  . Smokeless tobacco: Never Used  . Alcohol Use: No    Review of Systems Constitutional: Negative for fever. Cardiovascular: Negative for chest pain. Respiratory: Negative for shortness of breath. Gastrointestinal: Negative for abdominal pain Genitourinary: Negative for dysuria. Musculoskeletal: Positive for head and neck pain Neurological: Positive for posterior headache, denies focal weakness or numbness. 10-point ROS otherwise negative.  ____________________________________________   PHYSICAL EXAM:  VITAL SIGNS: ED Triage Vitals  Enc Vitals Group     BP 09/23/15 1714 120/74 mmHg     Pulse Rate 09/23/15 1714 73     Resp 09/23/15 1714 19     Temp 09/23/15 1714 98.1 F (36.7 C)     Temp Source 09/23/15 1714 Oral     SpO2 09/23/15 1714 96 %     Weight 09/23/15 1714 295 lb  (133.811 kg)     Height 09/23/15 1714  (1.651 m)     Head Cir --      Peak Flow --      Pain Score 09/23/15 1715 8     Pain Loc --      Pain Edu? --      Excl. in GC? --     Constitutional: Alert and oriented. Somnolent, but answers questions and follows commands appropriately. Eyes: Normal exam ENT   Head: Normocephalic and atraumatic. Mild cervical spine tenderness, more so right paraspinal area.   Mouth/Throat: Mucous membranes are moist. Cardiovascular: Normal rate, regular rhythm. No murmur Respiratory: Normal respiratory effort without tachypnea nor retractions. Breath sounds are clear and equal bilaterally. No wheezes/rales/rhonchi. Gastrointestinal: Soft and nontender. No distention.   Musculoskeletal: Nontender with normal range of motion in all extremities. Atraumatic extremities. Neurologic:  Normal speech and language. No gross focal neurologic deficits are appreciated. Speech is normal. Somnolence. Skin:  Skin is warm, dry and intact.  Psychiatric: Mood and affect are normal. Speech and behavior are normal.  ____________________________________________    EKG  EKG reviewed and interpreted by myself shows normal sinus rhythm at 72 bpm, narrow QRS, normal axis, normal intervals, nonspecific ST changes are present. No ST elevations noted.  ____________________________________________    RADIOLOGY  CT scan within normal limits.  ____________________________________________    INITIAL IMPRESSION / ASSESSMENT AND PLAN / ED COURSE  Pertinent labs & imaging results that were available during my care of the patient were reviewed by me and considered in my medical decision making (see chart for details).  Patient with likely tonic-clonic seizure, now with improving postictal period. We will check labs, CT head/C-spine given fall and pain. Otherwise the patient appears well, somnolent consistent with improving postictal period. We will closely monitor in the  emergency department while awaiting lab and imaging results.  CTs are within normal limits,   labs are largely within normal limits. We'll discharge patient home with neurology follow-up. Patient agreeable to plan. Very alert, oriented, normal per family and patient. ____________________________________________   FINAL CLINICAL IMPRESSION(S) / ED DIAGNOSES  Tonic-clonic seizure   Minna Antis, MD 09/23/15 [phone]

## 2015-09-23 NOTE — Discharge Instructions (Signed)

## 2015-09-23 NOTE — ED Notes (Signed)
Pt made aware for need to urinate, states will let RN know once has to go.

## 2015-09-23 NOTE — ED Notes (Signed)
MD Paduchowski at bedside  

## 2015-09-25 ENCOUNTER — Emergency Department: Payer: 59

## 2015-09-25 ENCOUNTER — Emergency Department
Admission: EM | Admit: 2015-09-25 | Discharge: 2015-09-25 | Disposition: A | Discharge status: Home / Self Care | Payer: 59 | Attending: Emergency Medicine | Admitting: Emergency Medicine

## 2015-09-25 DIAGNOSIS — R410 Disorientation, unspecified: Secondary | ICD-10-CM | POA: Insufficient documentation

## 2015-09-25 DIAGNOSIS — G8384 Todd's paralysis (postepileptic): Secondary | ICD-10-CM | POA: Diagnosis not present

## 2015-09-25 DIAGNOSIS — Z87891 Personal history of nicotine dependence: Secondary | ICD-10-CM | POA: Diagnosis not present

## 2015-09-25 DIAGNOSIS — F329 Major depressive disorder, single episode, unspecified: Secondary | ICD-10-CM | POA: Insufficient documentation

## 2015-09-25 DIAGNOSIS — R569 Unspecified convulsions: Secondary | ICD-10-CM | POA: Insufficient documentation

## 2015-09-25 LAB — CBC WITH DIFFERENTIAL/PLATELET
Basophils Absolute: 0 10*3/uL (ref 0–0.1)
Basophils Relative: 1 %
Eosinophils Absolute: 0.1 10*3/uL (ref 0–0.7)
Eosinophils Relative: 2 %
HEMATOCRIT: 42.6 % (ref 35.0–47.0)
HEMOGLOBIN: 14 g/dL (ref 12.0–16.0)
LYMPHS ABS: 2.3 10*3/uL (ref 1.0–3.6)
LYMPHS PCT: 44 %
MCH: 29 pg (ref 26.0–34.0)
MCHC: 32.9 g/dL (ref 32.0–36.0)
MCV: 88.1 fL (ref 80.0–100.0)
MONOS PCT: 7 %
Monocytes Absolute: 0.4 10*3/uL (ref 0.2–0.9)
NEUTROS ABS: 2.4 10*3/uL (ref 1.4–6.5)
NEUTROS PCT: 46 %
Platelets: 289 10*3/uL (ref 150–440)
RBC: 4.84 MIL/uL (ref 3.80–5.20)
RDW: 18.2 % — ABNORMAL HIGH (ref 11.5–14.5)
WBC: 5.2 10*3/uL (ref 3.6–11.0)

## 2015-09-25 LAB — COMPREHENSIVE METABOLIC PANEL
ALK PHOS: 117 U/L (ref 38–126)
ALT: 28 U/L (ref 14–54)
ANION GAP: 10 (ref 5–15)
AST: 28 U/L (ref 15–41)
Albumin: 4.6 g/dL (ref 3.5–5.0)
BILIRUBIN TOTAL: 0.5 mg/dL (ref 0.3–1.2)
BUN: 15 mg/dL (ref 6–20)
CALCIUM: 9.7 mg/dL (ref 8.9–10.3)
CO2: 30 mmol/L (ref 22–32)
CREATININE: 0.86 mg/dL (ref 0.44–1.00)
Chloride: 102 mmol/L (ref 101–111)
GFR calc non Af Amer: >60 mL/min (ref >60)
GLUCOSE: 97 mg/dL (ref 65–99)
Potassium: 3.7 mmol/L (ref 3.5–5.1)
Sodium: 142 mmol/L (ref 135–145)
TOTAL PROTEIN: 8.9 g/dL — AB (ref 6.5–8.1)

## 2015-09-25 LAB — TROPONIN I: Troponin I: <0.03 ng/mL (ref <0.031)

## 2015-09-25 MED ORDER — SODIUM CHLORIDE 0.9 % IV SOLN
200.0000 mg | Freq: Two times a day (BID) | INTRAVENOUS | Status: DC
  Administered 2015-09-25: 200 mg via INTRAVENOUS
  Filled 2015-09-25 (×2): qty 20

## 2015-09-25 MED ORDER — LACOSAMIDE 100 MG PO TABS: 100.0000 mg | ORAL_TABLET | Freq: Two times a day (BID) | ORAL | Status: DC

## 2015-09-25 NOTE — Discharge Instructions (Signed)

## 2015-09-25 NOTE — ED Notes (Signed)
Pt bib EMS w/ c/o seizure. Per EMS, pt was seen recently for same issue in ED.  Per EMS, pt had 1-2 min seizure and was then unresponsive for 10  Min prior to their arrival.  Per EMS, pt began to have slight facial droop.

## 2015-09-25 NOTE — ED Provider Notes (Signed)
[facility] Emergency Department Provider Note     Time seen: ----------------------------------------- 3:47 PM on 09/25/2015 -----------------------------------------  L5 caveat: Review of systems and history is limited by postictal state.  I have reviewed the triage vital signs and the nursing notes.   HISTORY  Chief Complaint Seizures    HPI Nicole Wyatt is a 49 y.o. female who is brought the ER for seizure episode. According to report she was seen recently for same. She had a seizure last 1-2 minutes she was unresponsive for 10 minutes and incontinent. Noted also to have slight facial drooping.Patient arrives somewhat confused, which limits her history.   Past Medical History  Diagnosis Date  . Depression   . Bipolar 1 disorder   . Oxygen dependent   . Arthritis   . Seizures 05/2013  . COPD (chronic obstructive pulmonary disease)     Patient Active Problem List   Diagnosis Date Noted  . Encounter for screening colonoscopy 06/10/2015  . Skin lesion of breast 06/10/2015    Past Surgical History  Procedure Laterality Date  . Abdominal hysterectomy  1999  . Colonoscopy    . Colonoscopy N/A 07/19/2015    Procedure: COLONOSCOPY;  Surgeon: Earline Mayotte, MD;  Location: [facility] ENDOSCOPY;  Service: Endoscopy;  Laterality: N/A;    Allergies Oxycodone and Percocet  Social History Social History  Substance Use Topics  . Smoking status: Former Smoker -- 1.00 packs/day for 14 years    Types: Cigarettes  . Smokeless tobacco: Never Used  . Alcohol Use: No    Review of Systems Unknown at this time  ____________________________________________   PHYSICAL EXAM:  VITAL SIGNS: ED Triage Vitals  Enc Vitals Group     BP --      Pulse --      Resp --      Temp --      Temp src --      SpO2 --      Weight --      Height --      Head Cir --      Peak Flow --      Pain Score --      Pain Loc --      Pain Edu? --      Excl. in  GC? --     Constitutional: Alert but drowsy and disoriented, and no acute distress Eyes: Conjunctivae are normal. PERRL. Normal extraocular movements. ENT   Head: Normocephalic and atraumatic.   Nose: No congestion/rhinnorhea.   Mouth/Throat: Mucous membranes are moist.   Neck: No stridor. Cardiovascular: Normal rate, regular rhythm. Normal and symmetric distal pulses are present in all extremities. No murmurs, rubs, or gallops. Respiratory: Normal respiratory effort without tachypnea nor retractions. Breath sounds are clear and equal bilaterally. No wheezes/rales/rhonchi. Gastrointestinal: Soft and nontender. No distention. No abdominal bruits.  Musculoskeletal: Nontender with normal range of motion in all extremities. No joint effusions.  No lower extremity tenderness nor edema. Neurologic:  Normal speech and language. Generalized weakness. Speech is normal. No gait instability. Left-sided facial drooping is noted Skin:  Skin is warm, dry and intact. No rash noted. Psychiatric: Depressed mood and affect. Patient somewhat altered however.  ____________________________________________  ED COURSE:  Pertinent labs & imaging results that were available during my care of the patient were reviewed by me and considered in my medical decision making (see chart for details). Acute on chronic seizure disorder, possible new neurologic symptoms. Will obtain head CT and labs.  ____________________________________________    LABS (pertinent positives/negatives)  Labs Reviewed  CBC WITH DIFFERENTIAL/PLATELET - Abnormal; Notable for the following:    RDW 18.2 (*)    All other components within normal limits  COMPREHENSIVE METABOLIC PANEL - Abnormal; Notable for the following:    Total Protein 8.9 (*)    All other components within normal limits  TROPONIN I  URINALYSIS COMPLETEWITH MICROSCOPIC ([facility] ONLY)  LEVETIRACETAM LEVEL    RADIOLOGY Images were viewed by me  CT  head IMPRESSION: Normal CT of the brain without intravenous contrast. ____________________________________________  FINAL ASSESSMENT AND PLAN  Seizure, Todd's paralysis  Plan: Patient with labs and imaging as dictated above. Her neurologic symptoms have resolved. 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 Have discussed with neurology, she was started on IV Vimpat 200 mg. She'll be discharged with 100 mg twice a day and follow-up in 2-3 weeks with neurology. Patient is agreeable to plan, I have advised that if her symptoms recur she will need to be admitted the hospital. At this point she is agreeable to going home.   Emily Filbert, MD   Emily Filbert, MD 09/25/15 [phone]

## 2015-10-31 LAB — LEVETIRACETAM LEVEL

## 2015-11-20 ENCOUNTER — Emergency Department
Admission: EM | Admit: 2015-11-20 | Discharge: 2015-11-20 | Disposition: A | Discharge status: Home / Self Care | Payer: 59 | Attending: Emergency Medicine | Admitting: Emergency Medicine

## 2015-11-20 DIAGNOSIS — L989 Disorder of the skin and subcutaneous tissue, unspecified: Secondary | ICD-10-CM | POA: Diagnosis present

## 2015-11-20 DIAGNOSIS — Z87891 Personal history of nicotine dependence: Secondary | ICD-10-CM | POA: Diagnosis not present

## 2015-11-20 DIAGNOSIS — Z79899 Other long term (current) drug therapy: Secondary | ICD-10-CM | POA: Insufficient documentation

## 2015-11-20 DIAGNOSIS — Z791 Long term (current) use of non-steroidal anti-inflammatories (NSAID): Secondary | ICD-10-CM | POA: Diagnosis not present

## 2015-11-20 DIAGNOSIS — Z792 Long term (current) use of antibiotics: Secondary | ICD-10-CM | POA: Diagnosis not present

## 2015-11-20 DIAGNOSIS — F3131 Bipolar disorder, current episode depressed, mild: Secondary | ICD-10-CM | POA: Insufficient documentation

## 2015-11-20 DIAGNOSIS — Z7982 Long term (current) use of aspirin: Secondary | ICD-10-CM | POA: Diagnosis not present

## 2015-11-20 DIAGNOSIS — B9689 Other specified bacterial agents as the cause of diseases classified elsewhere: Secondary | ICD-10-CM

## 2015-11-20 DIAGNOSIS — L089 Local infection of the skin and subcutaneous tissue, unspecified: Secondary | ICD-10-CM | POA: Insufficient documentation

## 2015-11-20 MED ORDER — SULFAMETHOXAZOLE-TRIMETHOPRIM 800-160 MG PO TABS: 1.0000 | ORAL_TABLET | Freq: Two times a day (BID) | ORAL | Status: DC

## 2015-11-20 NOTE — ED Notes (Addendum)
Patient states that she has 2 abscess that are developing on her lower abdomen, one in the mid lower abdomen and one on the left lower abdomen.. Patient states that she has had intermittent yellow drainage from the area

## 2015-11-20 NOTE — ED Notes (Signed)
Patient presents to the ED with multiple areas of skin infection over the past month.  Patient states she was attempting to treat at home but more areas have continued to emerge.  Patient is in no obvious distress at this time.  Patient reports one area is on her back, one is in her groin area, and one on her left hip.

## 2015-11-20 NOTE — Discharge Instructions (Signed)
Take medication as directed. Warm compresses to areas for 10 minutes 4 times a day.

## 2015-11-20 NOTE — ED Provider Notes (Signed)
[facility] Emergency Department Provider Note  ____________________________________________  Time seen: Approximately 4:25 PM  I have reviewed the triage vital signs and the nursing notes.   HISTORY  Chief Complaint Abscess    HPI Nicole Wyatt is a 49 y.o. female patient complaining of multiple erythematous papular lesions for 1 month. Patient states she's been attempting to treat the patient home with over-the-counter medications. Patient states she has 3 lesions at this time one on her back when her groin area and left hip. Patient state there is no drainage from these lesions. Patient rates the pain as a 3/10.   Past Medical History  Diagnosis Date  . Depression   . Bipolar 1 disorder (HCC)   . Oxygen dependent   . Arthritis   . Seizures (HCC) 05/2013  . COPD (chronic obstructive pulmonary disease) [facility](HCC)     Patient Active Problem List   Diagnosis Date Noted  . Encounter for screening colonoscopy 06/10/2015  . Skin lesion of breast 06/10/2015    Past Surgical History  Procedure Laterality Date  . Abdominal hysterectomy  1999  . Colonoscopy    . Colonoscopy N/A 07/19/2015    Procedure: COLONOSCOPY;  Surgeon: Earline MayotteJeffrey W Byrnett, MD;  [facility]COPY;  Service: Endoscopy;  Laterality: N/A;    Current Outpatient Rx  Name  Route  Sig  Dispense  Refill  . aspirin EC 81 MG tablet   Oral   Take 81 mg by mouth daily.         . clindamycin (CLEOCIN) 150 MG capsule   Oral   Take 2 capsules (300 mg total) by mouth 4 (four) times daily.   56 capsule   0   . EPINEPHrine (EPIPEN 2-PAK) 0.3 mg/0.3 mL IJ SOAJ injection   Intramuscular   Inject 0.3 mg into the muscle once. As needed for allergic reaction.         . Eslicarbazepine Acetate (APTIOM) 800 MG TABS   Oral   Take 800 mg by mouth daily.         . furosemide (LASIX) 40 MG tablet   Oral   Take 40 mg by mouth daily.         Marland Kitchen. ibuprofen (ADVIL,MOTRIN) 800 MG  tablet   Oral   Take 1 tablet (800 mg total) by mouth 3 (three) times daily.   30 tablet   0   . isosorbide mononitrate (IMDUR) 30 MG 24 hr tablet   Oral   Take 30 mg by mouth daily.         . Lacosamide (VIMPAT) 100 MG TABS   Oral   Take 1 tablet (100 mg total) by mouth 2 (two) times daily.   60 tablet   1   . levETIRAcetam (KEPPRA) 750 MG tablet   Oral   Take 1,500 mg by mouth 2 (two) times daily.         . meloxicam (MOBIC) 15 MG tablet   Oral   Take 15 mg by mouth daily.         . polyethylene glycol powder (GLYCOLAX/MIRALAX) powder      255 grams one bottle for colonoscopy prep   255 g   0   . potassium chloride SA (K-DUR,KLOR-CON) 20 MEQ tablet   Oral   Take 20 mEq by mouth daily.         . sertraline (ZOLOFT) 100 MG tablet   Oral   Take 200 mg by mouth at bedtime.          .Marland Kitchen  traZODone (DESYREL) 50 MG tablet   Oral   Take 50 mg by mouth at bedtime.         Marland Kitchen venlafaxine XR (EFFEXOR-XR) 37.5 MG 24 hr capsule   Oral   Take 37.5 mg by mouth daily with breakfast.           Allergies Oxycodone and Percocet  Family History  Problem Relation Age of Onset  . Hypertension Mother   . Hypertension Father     Social History Social History  Substance Use Topics  . Smoking status: Former Smoker -- 1.00 packs/day for 14 years    Types: Cigarettes  . Smokeless tobacco: Never Used  . Alcohol Use: No    Review of Systems Constitutional: No fever/chills Eyes: No visual changes. ENT: No sore throat. Cardiovascular: Denies chest pain. Respiratory: Denies shortness of breath. Gastrointestinal: No abdominal pain.  No nausea, no vomiting.  No diarrhea.  No constipation. Genitourinary: Negative for dysuria. Musculoskeletal: Negative for back pain. Skin: Negative for rash. Erythematous papular lesions. Neurological: Negative for headaches, focal weakness or numbness. Psychiatric:Depression and bipolar  disorder Endocrine: Hematological/Lymphatic: Allergic/Immunilogical: Oxycodone and Percocets  10-point ROS otherwise negative.  ____________________________________________   PHYSICAL EXAM:  VITAL SIGNS: ED Triage Vitals  Enc Vitals Group     BP 11/20/15 1516 133/64 mmHg     Pulse Rate 11/20/15 1516 78     Resp 11/20/15 1516 20     Temp 11/20/15 1516 98.4 F (36.9 C)     Temp Source 11/20/15 1516 Oral     SpO2 11/20/15 1516 93 %     Weight 11/20/15 1516 281 lb (127.461 kg)     Height 11/20/15 1516  (1.702 m)     Head Cir --      Peak Flow --      Pain Score --      Pain Loc --      Pain Edu? --      Excl. in GC? --     Constitutional: Alert and oriented. Well appearing and in no acute distress. Eyes: Conjunctivae are normal. PERRL. EOMI. Head: Atraumatic. Nose: No congestion/rhinnorhea. Mouth/Throat: Mucous membranes are moist.  Oropharynx non-erythematous. Neck: No stridor. No cervical spine tenderness to palpation. Hematological/Lymphatic/Immunilogical: No cervical lymphadenopathy. Cardiovascular: Normal rate, regular rhythm. Grossly normal heart sounds.  Good peripheral circulation. Respiratory: Normal respiratory effort.  No retractions. Lungs CTAB. Gastrointestinal: Soft and nontender. No distention. No abdominal bruits. No CVA tenderness. Musculoskeletal: No lower extremity tenderness nor edema.  No joint effusions. Neurologic:  Normal speech and language. No gross focal neurologic deficits are appreciated. No gait instability. Skin:  Skin is warm, dry and intact. 3 erythematous papular lesions. Lesions are nonfluctuant. Psychiatric: Mood and affect are normal. Speech and behavior are normal.  ____________________________________________   LABS (all labs ordered are listed, but only abnormal results are displayed)  Labs Reviewed - No data to  display ____________________________________________  EKG   ____________________________________________  RADIOLOGY   ____________________________________________   PROCEDURES  Procedure(s) performed: None  Critical Care performed: No  ____________________________________________   INITIAL IMPRESSION / ASSESSMENT AND PLAN / ED COURSE  Pertinent labs & imaging results that were available during my care of the patient were reviewed by me and considered in my medical decision making (see chart for details).  Skin infection. Discussed rationale for not performing incision and drainage at this time. Patient given discharge instruction and advised to take Bactrim as directed. Patient advised to follow-up with family doctor in one  week. Return by ER if condition worsens. ____________________________________________   FINAL CLINICAL IMPRESSION(S) / ED DIAGNOSES  Final diagnoses:  Skin infection, bacterial      Joni Reining, PA-C 11/20/15 1630  Sharman Cheek, MD 11/20/15 2329

## 2015-12-13 ENCOUNTER — Emergency Department
Admission: EM | Admit: 2015-12-13 | Discharge: 2015-12-13 | Disposition: A | Discharge status: Home / Self Care | Payer: 59 | Attending: Emergency Medicine | Admitting: Emergency Medicine

## 2015-12-13 ENCOUNTER — Encounter: Payer: Self-pay | Admitting: Emergency Medicine

## 2015-12-13 DIAGNOSIS — H9222 Otorrhagia, left ear: Secondary | ICD-10-CM | POA: Diagnosis not present

## 2015-12-13 DIAGNOSIS — Z87891 Personal history of nicotine dependence: Secondary | ICD-10-CM | POA: Diagnosis not present

## 2015-12-13 DIAGNOSIS — Z7982 Long term (current) use of aspirin: Secondary | ICD-10-CM | POA: Insufficient documentation

## 2015-12-13 DIAGNOSIS — H9202 Otalgia, left ear: Secondary | ICD-10-CM | POA: Diagnosis present

## 2015-12-13 DIAGNOSIS — Z791 Long term (current) use of non-steroidal anti-inflammatories (NSAID): Secondary | ICD-10-CM | POA: Insufficient documentation

## 2015-12-13 DIAGNOSIS — Z792 Long term (current) use of antibiotics: Secondary | ICD-10-CM | POA: Insufficient documentation

## 2015-12-13 MED ORDER — OFLOXACIN 0.3 % OP SOLN: 1.0000 [drp] | Freq: Four times a day (QID) | OPHTHALMIC | Status: AC

## 2015-12-13 NOTE — ED Notes (Signed)
Pt to ed with c/o left ear bleeding and pain x 1 week.  Pt states worse at night.

## 2015-12-13 NOTE — ED Provider Notes (Signed)
[facility] Emergency Department Provider Note  ____________________________________________  Time seen: Approximately 5:52 PM  I have reviewed the triage vital signs and the nursing notes.   HISTORY  Chief Complaint Otalgia    HPI Nicole Wyatt is a 49 y.o. female presents for evaluation of bleeding from the left ear times one week. Patient states is worse at night. Denies any trauma. Denies using any Q-tips.   Past Medical History  Diagnosis Date  . Depression   . Bipolar 1 disorder (HCC)   . Oxygen dependent   . Arthritis   . Seizures (HCC) 05/2013  . COPD (chronic obstructive pulmonary disease) [facility])     Patient Active Problem List   Diagnosis Date Noted  . Encounter for screening colonoscopy 06/10/2015  . Skin lesion of breast 06/10/2015    Past Surgical History  Procedure Laterality Date  . Abdominal hysterectomy  1999  . Colonoscopy    . Colonoscopy N/A 07/19/2015    Procedure: COLONOSCOPY;  Surgeon: Earline Mayotte, MD;  Location[facility] ENDOSCOPY;  Service: Endoscopy;  Laterality: N/A;    Current Outpatient Rx  Name  Route  Sig  Dispense  Refill  . aspirin EC 81 MG tablet   Oral   Take 81 mg by mouth daily.         . clindamycin (CLEOCIN) 150 MG capsule   Oral   Take 2 capsules (300 mg total) by mouth 4 (four) times daily.   56 capsule   0   . EPINEPHrine (EPIPEN 2-PAK) 0.3 mg/0.3 mL IJ SOAJ injection   Intramuscular   Inject 0.3 mg into the muscle once. As needed for allergic reaction.         . Eslicarbazepine Acetate (APTIOM) 800 MG TABS   Oral   Take 800 mg by mouth daily.         . furosemide (LASIX) 40 MG tablet   Oral   Take 40 mg by mouth daily.         Marland Kitchen ibuprofen (ADVIL,MOTRIN) 800 MG tablet   Oral   Take 1 tablet (800 mg total) by mouth 3 (three) times daily.   30 tablet   0   . isosorbide mononitrate (IMDUR) 30 MG 24 hr tablet   Oral   Take 30 mg by mouth daily.         . Lacosamide  (VIMPAT) 100 MG TABS   Oral   Take 1 tablet (100 mg total) by mouth 2 (two) times daily.   60 tablet   1   . levETIRAcetam (KEPPRA) 750 MG tablet   Oral   Take 1,500 mg by mouth 2 (two) times daily.         . meloxicam (MOBIC) 15 MG tablet   Oral   Take 15 mg by mouth daily.         Marland Kitchen ofloxacin (OCUFLOX) 0.3 % ophthalmic solution   Left Ear   Place 1 drop into the left ear 4 (four) times daily.   10 mL   0   . polyethylene glycol powder (GLYCOLAX/MIRALAX) powder      255 grams one bottle for colonoscopy prep   255 g   0   . potassium chloride SA (K-DUR,KLOR-CON) 20 MEQ tablet   Oral   Take 20 mEq by mouth daily.         . sertraline (ZOLOFT) 100 MG tablet   Oral   Take 200 mg by mouth at bedtime.          Marland Kitchen  sulfamethoxazole-trimethoprim (BACTRIM DS,SEPTRA DS) 800-160 MG tablet   Oral   Take 1 tablet by mouth 2 (two) times daily.   20 tablet   0   . traZODone (DESYREL) 50 MG tablet   Oral   Take 50 mg by mouth at bedtime.         Marland Kitchen. venlafaxine XR (EFFEXOR-XR) 37.5 MG 24 hr capsule   Oral   Take 37.5 mg by mouth daily with breakfast.           Allergies Oxycodone and Percocet  Family History  Problem Relation Age of Onset  . Hypertension Mother   . Hypertension Father     Social History Social History  Substance Use Topics  . Smoking status: Former Smoker -- 1.00 packs/day for 14 years    Types: Cigarettes  . Smokeless tobacco: Never Used  . Alcohol Use: No    Review of Systems Constitutional: No fever/chills Eyes: No visual changes. ENT: No sore throat. Positive bleeding from left ear. Cardiovascular: Denies chest pain. Respiratory: Denies shortness of breath. Gastrointestinal: No abdominal pain.  No nausea, no vomiting.  No diarrhea.  No constipation. Genitourinary: Negative for dysuria. Musculoskeletal: Negative for back pain. Skin: Negative for rash. Neurological: Negative for headaches, focal weakness or  numbness.  10-point ROS otherwise negative.  ____________________________________________   PHYSICAL EXAM:  VITAL SIGNS: ED Triage Vitals  Enc Vitals Group     BP 12/13/15 1720 122/73 mmHg     Pulse Rate 12/13/15 1720 75     Resp 12/13/15 1720 20     Temp 12/13/15 1720 98.5 F (36.9 C)     Temp Source 12/13/15 1720 Oral     SpO2 12/13/15 1720 95 %     Weight 12/13/15 1720 279 lb (126.554 kg)     Height 12/13/15 1720 5\' 7"  (1.702 m)     Head Cir --      Peak Flow --      Pain Score 12/13/15 1720 9     Pain Loc --      Pain Edu? --      Excl. in GC? --     Constitutional: Alert and oriented. Well appearing and in no acute distress. Eyes: Conjunctivae are normal. PERRL. EOMI. Head: Atraumatic. Left ear with blood noted on the ear canal at the base. Questionable granuloma formation. Nose: No congestion/rhinnorhea. Mouth/Throat: Mucous membranes are moist.  Oropharynx non-erythematous. Neck: No stridor.   Cardiovascular: Normal rate, regular rhythm. Grossly normal heart sounds.  Good peripheral circulation. Respiratory: Normal respiratory effort.  No retractions. Lungs CTAB. Gastrointestinal: Soft and nontender. No distention. No abdominal bruits. No CVA tenderness. Musculoskeletal: No lower extremity tenderness nor edema.  No joint effusions. Neurologic:  Normal speech and language. No gross focal neurologic deficits are appreciated. No gait instability. Skin:  Skin is warm, dry and intact. No rash noted. Psychiatric: Mood and affect are normal. Speech and behavior are normal.  ____________________________________________   LABS (all labs ordered are listed, but only abnormal results are displayed)  Labs Reviewed - No data to display ____________________________________________    PROCEDURES  Procedure(s) performed: None  Critical Care performed: No  ____________________________________________   INITIAL IMPRESSION / ASSESSMENT AND PLAN / ED  COURSE  Pertinent labs & imaging results that were available during my care of the patient were reviewed by me and considered in my medical decision making (see chart for details).  Questionable granuloma to the inner left ear. Referral to Dr. Jenne CampusMcQueen tomorrow in his office.  Patient to call for appointment. Started on Ocuflox antibiotic drops. Patient voices no other emergency medical complaints at this time. ____________________________________________   FINAL CLINICAL IMPRESSION(S) / ED DIAGNOSES  Final diagnoses:  Ear bleeding, left      Evangeline Dakin, PA-C 12/13/15 2318  Phineas Semen, MD 12/14/15 [id number]

## 2015-12-13 NOTE — ED Notes (Signed)
Discussed discharge instructions, prescriptions, and follow-up care with patient. No questions or concerns at this time. Pt stable at discharge.  

## 2016-01-12 ENCOUNTER — Ambulatory Visit
Admission: RE | Admit: 2016-01-12 | Discharge: 2016-01-12 | Disposition: A | Discharge status: Home / Self Care | Payer: BLUE CROSS/BLUE SHIELD | Source: Ambulatory Visit | Attending: Internal Medicine | Admitting: Internal Medicine

## 2016-01-12 ENCOUNTER — Other Ambulatory Visit: Payer: Self-pay | Admitting: Internal Medicine

## 2016-01-12 DIAGNOSIS — R05 Cough: Secondary | ICD-10-CM

## 2016-01-12 DIAGNOSIS — R059 Cough, unspecified: Secondary | ICD-10-CM

## 2016-02-29 ENCOUNTER — Ambulatory Visit (INDEPENDENT_AMBULATORY_CARE_PROVIDER_SITE_OTHER): Payer: BLUE CROSS/BLUE SHIELD | Admitting: General Surgery

## 2016-02-29 ENCOUNTER — Encounter: Payer: Self-pay | Admitting: General Surgery

## 2016-02-29 VITALS — BP 154/86 | HR 84 | Resp 14 | Ht 67.0 in | Wt 295.0 lb

## 2016-02-29 DIAGNOSIS — L989 Disorder of the skin and subcutaneous tissue, unspecified: Secondary | ICD-10-CM | POA: Diagnosis not present

## 2016-02-29 NOTE — Patient Instructions (Signed)
One week nurse.  

## 2016-02-29 NOTE — Progress Notes (Signed)
Patient ID: Nicole Wyatt, female   DOB: 07/14/1966, 50 y.o.   MRN: [id number]  Chief Complaint  Patient presents with  . Other    left cheek drainage    HPI Nicole Wyatt is a 50 y.o. female here today for a evaluation of a mole on her right buttock and right arm. She states the area on her arm is draning and the one on her buttock is starting to drain. Patient states they have been there for about a year and the lesion on the arm has drained only a couple of times. No drainage from the area on the right gluteal site.  I personally reviewed the patient's history.    HPI  Past Medical History  Diagnosis Date  . Depression   . Bipolar 1 disorder (HCC)   . Oxygen dependent   . Arthritis   . Seizures (HCC) 05/2013  . COPD (chronic obstructive pulmonary disease) [facility](HCC)     Past Surgical History  Procedure Laterality Date  . Abdominal hysterectomy  1999  . Colonoscopy    . Colonoscopy N/A 07/19/2015    Procedure: COLONOSCOPY;  Surgeon: Earline MayotteJeffrey W Camdyn Beske, MD;  Location[facility] ENDOSCOPY;  Service: Endoscopy;  Laterality: N/A;    Family History  Problem Relation Age of Onset  . Hypertension Mother   . Hypertension Father     Social History Social History  Substance Use Topics  . Smoking status: Former Smoker -- 1.00 packs/day for 14 years    Types: Cigarettes  . Smokeless tobacco: Never Used  . Alcohol Use: No    Allergies  Allergen Reactions  . Oxycodone Hives  . Percocet [Oxycodone-Acetaminophen] Hives    Current Outpatient Prescriptions  Medication Sig Dispense Refill  . aspirin EC 81 MG tablet Take 81 mg by mouth daily.    Marland Kitchen. EPINEPHrine (EPIPEN 2-PAK) 0.3 mg/0.3 mL IJ SOAJ injection Inject 0.3 mg into the muscle once. As needed for allergic reaction.    . Eslicarbazepine Acetate (APTIOM) 800 MG TABS Take 800 mg by mouth daily.    . furosemide (LASIX) 40 MG tablet Take 40 mg by mouth daily.    . isosorbide mononitrate (IMDUR) 30 MG 24 hr tablet Take 30 mg  by mouth daily.    . Lacosamide (VIMPAT) 100 MG TABS Take 1 tablet (100 mg total) by mouth 2 (two) times daily. 60 tablet 1  . levETIRAcetam (KEPPRA) 750 MG tablet Take 1,500 mg by mouth 2 (two) times daily.    . meloxicam (MOBIC) 15 MG tablet Take 15 mg by mouth daily.    . OXYGEN Inhale 2 L into the lungs daily.    Marland Kitchen. venlafaxine XR (EFFEXOR-XR) 37.5 MG 24 hr capsule Take 37.5 mg by mouth daily with breakfast.    . ibuprofen (ADVIL,MOTRIN) 800 MG tablet Take 1 tablet (800 mg total) by mouth 3 (three) times daily. (Patient not taking: Reported on 02/29/2016) 30 tablet 0   No current facility-administered medications for this visit.    Review of Systems Review of Systems  Constitutional: Negative.   Respiratory: Negative.   Cardiovascular: Negative.     Blood pressure 154/86, pulse 84, resp. rate 14, height 5\' 7"  (1.702 m), weight 295 lb (133.811 kg).  Physical Exam Physical Exam  Constitutional: She is oriented to person, place, and time. She appears well-developed and well-nourished.  Neurological: She is alert and oriented to person, place, and time.  Skin: Skin is warm and dry.  Assessment    Likely inflamed cyst versus seborrheic keratosis.    Plan    The symptomatic area on the right upper arm will be removed, and this information will determine if the gluteal area needs to be excised as well.  The area was cleansed with alcohol and a total of 10 mL of 0.5% Xylocaine with 0.25% Marcaine with 1-200,000 epinephrine was utilized well tolerated. ChloraPrep was applied to the skin. The area was excised through an elliptical incision. The deep tissue was approximated with interrupted 3-0 Vicryl figure-of-eight suture. Skin was closed with interrupted 4-0 Prolene simple and horizontal mattress sutures. Telfa and Tegaderm applied.  Patient will make use of ice and Tylenol for comfort. Follow-up in one week for suture removal. She'll be contacted when pathology is  available.   Patient to return in one week nurse.   PCP:  Maryellen Pile This information has been scribed by Ples Specter CMA.   Earline Mayotte 03/01/2016, 2:57 PM

## 2016-03-01 DIAGNOSIS — L989 Disorder of the skin and subcutaneous tissue, unspecified: Secondary | ICD-10-CM | POA: Insufficient documentation

## 2016-03-02 ENCOUNTER — Telehealth: Payer: Self-pay | Admitting: General Surgery

## 2016-03-02 NOTE — Telephone Encounter (Signed)
Notified path benign: irritated dermatofibroma. Reports doing well. Will return for suture removal next week. Right gluteal wound does not need RX unless irritating.

## 2016-03-07 ENCOUNTER — Ambulatory Visit (INDEPENDENT_AMBULATORY_CARE_PROVIDER_SITE_OTHER): Payer: BLUE CROSS/BLUE SHIELD | Admitting: *Deleted

## 2016-03-07 DIAGNOSIS — L989 Disorder of the skin and subcutaneous tissue, unspecified: Secondary | ICD-10-CM

## 2016-03-07 NOTE — Progress Notes (Signed)
Patient came in today for a wound check right arm excision.  The wound is clean, with no signs of infection noted. Bruising noted at site. Sutures removed, steri strips applied.

## 2016-04-02 ENCOUNTER — Emergency Department: Payer: BLUE CROSS/BLUE SHIELD

## 2016-04-02 ENCOUNTER — Emergency Department
Admission: EM | Admit: 2016-04-02 | Discharge: 2016-04-03 | Disposition: A | Discharge status: Home / Self Care | Payer: BLUE CROSS/BLUE SHIELD | Attending: Emergency Medicine | Admitting: Emergency Medicine

## 2016-04-02 DIAGNOSIS — Z87891 Personal history of nicotine dependence: Secondary | ICD-10-CM | POA: Insufficient documentation

## 2016-04-02 DIAGNOSIS — R531 Weakness: Secondary | ICD-10-CM | POA: Insufficient documentation

## 2016-04-02 DIAGNOSIS — M1990 Unspecified osteoarthritis, unspecified site: Secondary | ICD-10-CM | POA: Diagnosis not present

## 2016-04-02 DIAGNOSIS — J449 Chronic obstructive pulmonary disease, unspecified: Secondary | ICD-10-CM | POA: Insufficient documentation

## 2016-04-02 DIAGNOSIS — G40909 Epilepsy, unspecified, not intractable, without status epilepticus: Secondary | ICD-10-CM | POA: Insufficient documentation

## 2016-04-02 DIAGNOSIS — Z791 Long term (current) use of non-steroidal anti-inflammatories (NSAID): Secondary | ICD-10-CM | POA: Insufficient documentation

## 2016-04-02 DIAGNOSIS — Z7982 Long term (current) use of aspirin: Secondary | ICD-10-CM | POA: Insufficient documentation

## 2016-04-02 DIAGNOSIS — F319 Bipolar disorder, unspecified: Secondary | ICD-10-CM | POA: Insufficient documentation

## 2016-04-02 DIAGNOSIS — R569 Unspecified convulsions: Secondary | ICD-10-CM | POA: Diagnosis present

## 2016-04-02 LAB — CBC WITH DIFFERENTIAL/PLATELET
Basophils Absolute: 0 10*3/uL (ref 0–0.1)
Basophils Relative: 1 %
EOS ABS: 0.1 10*3/uL (ref 0–0.7)
Eosinophils Relative: 1 %
HEMATOCRIT: 41.1 % (ref 35.0–47.0)
HEMOGLOBIN: 13.7 g/dL (ref 12.0–16.0)
LYMPHS ABS: 2.6 10*3/uL (ref 1.0–3.6)
Lymphocytes Relative: 38 %
MCH: 29.4 pg (ref 26.0–34.0)
MCHC: 33.3 g/dL (ref 32.0–36.0)
MCV: 88.3 fL (ref 80.0–100.0)
Monocytes Absolute: 0.7 10*3/uL (ref 0.2–0.9)
Monocytes Relative: 10 %
NEUTROS PCT: 50 %
Neutro Abs: 3.5 10*3/uL (ref 1.4–6.5)
Platelets: 265 10*3/uL (ref 150–440)
RBC: 4.65 MIL/uL (ref 3.80–5.20)
RDW: 17.2 % — ABNORMAL HIGH (ref 11.5–14.5)
WBC: 6.9 10*3/uL (ref 3.6–11.0)

## 2016-04-02 LAB — BASIC METABOLIC PANEL
Anion gap: 3 — ABNORMAL LOW (ref 5–15)
BUN: 14 mg/dL (ref 6–20)
CALCIUM: 9.2 mg/dL (ref 8.9–10.3)
CHLORIDE: 111 mmol/L (ref 101–111)
CO2: 26 mmol/L (ref 22–32)
CREATININE: 0.71 mg/dL (ref 0.44–1.00)
GFR calc non Af Amer: >60 mL/min (ref >60)
GLUCOSE: 96 mg/dL (ref 65–99)
Potassium: 3.4 mmol/L — ABNORMAL LOW (ref 3.5–5.1)
Sodium: 140 mmol/L (ref 135–145)

## 2016-04-02 MED ORDER — SODIUM CHLORIDE 0.9 % IV BOLUS (SEPSIS): 1000.0000 mL | Freq: Once | INTRAVENOUS | Status: DC

## 2016-04-02 NOTE — Discharge Instructions (Signed)

## 2016-04-02 NOTE — ED Notes (Signed)
Family at bedside. 

## 2016-04-02 NOTE — ED Notes (Signed)
Pt had a witnessed seizure. Pt had lose of urinary control. Family reports pt had usual seizure. Pt is now alert and oriented.

## 2016-04-02 NOTE — ED Notes (Signed)
Patient denies pain and is resting comfortably.  

## 2016-04-02 NOTE — ED Provider Notes (Signed)
[facility] Emergency Department Provider Note  ____________________________________________  Time seen: 9:55 PM  I have reviewed the triage vital signs and the nursing notes.   HISTORY  Chief Complaint Seizures   Level 5 caveat:  Portions of the history and physical were unable to be obtained due to the patient's acute illness  History obtained from patient after she regained her mental status. History obtained during initial evaluation during patient's family at the bedside.  HPI Nicole Wyatt is a 50 y.o. female brought to the ED after a seizure. She was in her usual state of health until about 8:30 PM today when she had generalized convulsive motor activity. This was associated with a loss of consciousness and urinary incontinence. She is been confused since then. There is also spontaneously lasted several minutes. No significant trauma during the event.  She has a history of epilepsy and is on Keppra. This seizure follows her usual seizure pattern. Additionally, she has chronic left-sided weakness which always is worse after seizures.Per the patient's family, she currently has weakness of the left face arm and leg which are entirely typical of her seizures. No recent symptoms, has been eating and drinking normally afebrile no vomiting.     Past Medical History  Diagnosis Date  . Depression   . Bipolar 1 disorder (HCC)   . Oxygen dependent   . Arthritis   . Seizures (HCC) 05/2013  . COPD (chronic obstructive pulmonary disease) [facility])      Patient Active Problem List   Diagnosis Date Noted  . Skin lesion of right arm 03/01/2016  . Encounter for screening colonoscopy 06/10/2015  . Skin lesion of breast 06/10/2015     Past Surgical History  Procedure Laterality Date  . Abdominal hysterectomy  1999  . Colonoscopy    . Colonoscopy N/A 07/19/2015    Procedure: COLONOSCOPY;  Surgeon: Earline Mayotte, MD;  Location: [facility] ENDOSCOPY;  Service:  Endoscopy;  Laterality: N/A;     Current Outpatient Rx  Name  Route  Sig  Dispense  Refill  . aspirin EC 81 MG tablet   Oral   Take 81 mg by mouth daily.         Marland Kitchen EPINEPHrine (EPIPEN 2-PAK) 0.3 mg/0.3 mL IJ SOAJ injection   Intramuscular   Inject 0.3 mg into the muscle once. As needed for allergic reaction.         . Eslicarbazepine Acetate (APTIOM) 800 MG TABS   Oral   Take 800 mg by mouth daily.         . furosemide (LASIX) 40 MG tablet   Oral   Take 40 mg by mouth daily.         Marland Kitchen ibuprofen (ADVIL,MOTRIN) 800 MG tablet   Oral   Take 1 tablet (800 mg total) by mouth 3 (three) times daily. Patient not taking: Reported on 02/29/2016   30 tablet   0   . isosorbide mononitrate (IMDUR) 30 MG 24 hr tablet   Oral   Take 30 mg by mouth daily.         . Lacosamide (VIMPAT) 100 MG TABS   Oral   Take 1 tablet (100 mg total) by mouth 2 (two) times daily.   60 tablet   1   . levETIRAcetam (KEPPRA) 750 MG tablet   Oral   Take 1,500 mg by mouth 2 (two) times daily.         . meloxicam (MOBIC) 15 MG tablet  Oral   Take 15 mg by mouth daily.         . OXYGEN   Inhalation   Inhale 2 L into the lungs daily.         Marland Kitchen. venlafaxine XR (EFFEXOR-XR) 37.5 MG 24 hr capsule   Oral   Take 37.5 mg by mouth daily with breakfast.            Allergies Oxycodone and Percocet   Family History  Problem Relation Age of Onset  . Hypertension Mother   . Hypertension Father     Social History Social History  Substance Use Topics  . Smoking status: Former Smoker -- 1.00 packs/day for 14 years    Types: Cigarettes  . Smokeless tobacco: Never Used  . Alcohol Use: No    Review of Systems  Constitutional:   No fever or chills.  Eyes:   No vision changes.  ENT:   No sore throat. No rhinorrhea. Cardiovascular:   No chest pain. Respiratory:   No dyspnea or cough. Gastrointestinal:   Negative for abdominal pain, vomiting and diarrhea.  No bloody  stool. Genitourinary:   Negative for dysuria or difficulty urinating. Musculoskeletal:   Negative for focal pain or swelling Neurological:   Negative for headaches. Positive seizure today. Chronic left-sided weakness 10-point ROS otherwise negative.  ____________________________________________   PHYSICAL EXAM:  VITAL SIGNS: ED Triage Vitals  Enc Vitals Group     BP 04/02/16 2203 149/77 mmHg     Pulse Rate 04/02/16 2203 73     Resp 04/02/16 2203 18     Temp 04/02/16 2203 98.6 F (37 C)     Temp src --      SpO2 04/02/16 2203 96 %     Weight --      Height --      Head Cir --      Peak Flow --      Pain Score 04/02/16 2204 0     Pain Loc --      Pain Edu? --      Excl. in GC? --     Vital signs reviewed, nursing assessments reviewed.   Constitutional:   Awake but confused. No distress. Eyes:   No scleral icterus. No conjunctival pallor. PERRL. EOMI ENT   Head:   Normocephalic and atraumatic.   Nose:   No congestion/rhinnorhea. No septal hematoma   Mouth/Throat:   MMM, no pharyngeal erythema. No peritonsillar mass.    Neck:   No stridor. No SubQ emphysema. No meningismus. Hematological/Lymphatic/Immunilogical:   No cervical lymphadenopathy. Cardiovascular:   RRR. Symmetric bilateral radial and DP pulses.  No murmurs.  Respiratory:   Normal respiratory effort without tachypnea nor retractions. Breath sounds are clear and equal bilaterally. No wheezes/rales/rhonchi. Gastrointestinal:   Soft and nontender. Non distended. There is no CVA tenderness.  No rebound, rigidity, or guarding. Genitourinary:   deferred Musculoskeletal:   Nontender with normal range of motion in all extremities. No joint effusions.  No lower extremity tenderness.  No edema. Neurologic:   Dysarthric speech Left facial droop and facial muscle weakness. Tongue deviation to the left. 3 out of 5 strength of left arm and left leg. Right-sided body intact strength and unremarkable exam. Skin:     Skin is warm, dry and intact. No rash noted.  No petechiae, purpura, or bullae.  ____________________________________________    LABS (pertinent positives/negatives) (all labs ordered are listed, but only abnormal results are displayed) Labs Reviewed  BASIC METABOLIC PANEL -  Abnormal; Notable for the following:    Potassium 3.4 (*)    Anion gap 3 (*)    All other components within normal limits  CBC WITH DIFFERENTIAL/PLATELET - Abnormal; Notable for the following:    RDW 17.2 (*)    All other components within normal limits  URINALYSIS COMPLETEWITH MICROSCOPIC ([facility] ONLY)   ____________________________________________   EKG    ____________________________________________    RADIOLOGY  CT head unremarkable CT cervical spine unremarkable  ____________________________________________   PROCEDURES   ____________________________________________   INITIAL IMPRESSION / ASSESSMENT AND PLAN / ED COURSE  Pertinent labs & imaging results that were available during my care of the patient were reviewed by me and considered in my medical decision making (see chart for details).  Patient presents with seizure that is spontaneously terminated along with left side of the body weakness which appears to be acute on chronic. According the family this is entirely her baseline and they find no features that are unusual for her. Therefore we will not consider this a code stroke but will work up with CT head labs and urinalysis. We'll reassess, expecting to see improvement.  ----------------------------------------- 11:47 PM on 04/02/2016 -----------------------------------------  On reassessment, cranial nerve function has returned to normal. There is no facial muscle weakness or droop. No tongue deviation. Motor strength in the left arm and left leg have returned to her baseline which is still relatively weak compared to the right side before to 5 strength and entirely normal for  her. I did offer the patient admission for further workup but she is comfortable that she is back to her baseline and refuses and wishes to go home. I think this is reasonable given her own insight into her illness. Still waiting for a urinalysis to ensure that she doesn't have a occult urinary tract infection that might be provoking the seizure. Care of the patient is signed out to Dr. Manson Passey to follow up on urinalysis with a plan to discharge home, with antibiotics as needed.     ____________________________________________   FINAL CLINICAL IMPRESSION(S) / ED DIAGNOSES  Final diagnoses:  Seizure (HCC)  Weakness of left side of body       Portions of this note were generated with dragon dictation software. Dictation errors may occur despite best attempts at proofreading.   Sharman Cheek, MD 04/02/16 [phone]

## 2016-04-03 LAB — URINALYSIS COMPLETE WITH MICROSCOPIC (ARMC ONLY)
BILIRUBIN URINE: NEGATIVE
GLUCOSE, UA: NEGATIVE mg/dL
Ketones, ur: NEGATIVE mg/dL
LEUKOCYTES UA: NEGATIVE
NITRITE: NEGATIVE
Protein, ur: NEGATIVE mg/dL
Specific Gravity, Urine: 1.017 (ref 1.005–1.030)
pH: 6 (ref 5.0–8.0)

## 2016-04-03 MED ORDER — SODIUM CHLORIDE 0.9 % IV SOLN
1000.0000 mg | Freq: Once | INTRAVENOUS | Status: AC
  Administered 2016-04-03: 1000 mg via INTRAVENOUS
  Filled 2016-04-03: qty 10

## 2016-04-03 NOTE — ED Provider Notes (Signed)
I Assumed care of patient 11:45 PM from Dr. Scotty CourtStafford. Urinalysis revealed no evidence of urinary tract infection. Labs Reviewed  BASIC METABOLIC PANEL - Abnormal; Notable for the following:    Potassium 3.4 (*)    Anion gap 3 (*)    All other components within normal limits  URINALYSIS COMPLETEWITH MICROSCOPIC ([facility] ONLY) - Abnormal; Notable for the following:    Color, Urine YELLOW (*)    APPearance CLEAR (*)    Hgb urine dipstick 1+ (*)    Bacteria, UA RARE (*)    Squamous Epithelial / LPF 0-5 (*)    All other components within normal limits  CBC WITH DIFFERENTIAL/PLATELET - Abnormal; Notable for the following:    RDW 17.2 (*)    All other components within normal limits     Darci Currentandolph N Jalexus Brett, MD 04/04/16 [phone]

## 2016-04-19 ENCOUNTER — Emergency Department: Payer: BLUE CROSS/BLUE SHIELD

## 2016-04-19 ENCOUNTER — Emergency Department
Admission: EM | Admit: 2016-04-19 | Discharge: 2016-04-19 | Disposition: A | Discharge status: Home / Self Care | Payer: BLUE CROSS/BLUE SHIELD | Attending: Emergency Medicine | Admitting: Emergency Medicine

## 2016-04-19 ENCOUNTER — Encounter: Payer: Self-pay | Admitting: *Deleted

## 2016-04-19 DIAGNOSIS — Z791 Long term (current) use of non-steroidal anti-inflammatories (NSAID): Secondary | ICD-10-CM | POA: Insufficient documentation

## 2016-04-19 DIAGNOSIS — J449 Chronic obstructive pulmonary disease, unspecified: Secondary | ICD-10-CM | POA: Insufficient documentation

## 2016-04-19 DIAGNOSIS — Z87891 Personal history of nicotine dependence: Secondary | ICD-10-CM | POA: Diagnosis not present

## 2016-04-19 DIAGNOSIS — Z7982 Long term (current) use of aspirin: Secondary | ICD-10-CM | POA: Insufficient documentation

## 2016-04-19 DIAGNOSIS — Z79899 Other long term (current) drug therapy: Secondary | ICD-10-CM | POA: Insufficient documentation

## 2016-04-19 DIAGNOSIS — F315 Bipolar disorder, current episode depressed, severe, with psychotic features: Secondary | ICD-10-CM | POA: Diagnosis not present

## 2016-04-19 DIAGNOSIS — M1990 Unspecified osteoarthritis, unspecified site: Secondary | ICD-10-CM | POA: Insufficient documentation

## 2016-04-19 DIAGNOSIS — R079 Chest pain, unspecified: Secondary | ICD-10-CM | POA: Insufficient documentation

## 2016-04-19 LAB — CBC
HEMATOCRIT: 41.3 % (ref 35.0–47.0)
HEMOGLOBIN: 13.7 g/dL (ref 12.0–16.0)
MCH: 29.9 pg (ref 26.0–34.0)
MCHC: 33 g/dL (ref 32.0–36.0)
MCV: 90.4 fL (ref 80.0–100.0)
Platelets: 274 10*3/uL (ref 150–440)
RBC: 4.57 MIL/uL (ref 3.80–5.20)
RDW: 17.8 % — ABNORMAL HIGH (ref 11.5–14.5)
WBC: 5.5 10*3/uL (ref 3.6–11.0)

## 2016-04-19 LAB — BASIC METABOLIC PANEL
ANION GAP: 11 (ref 5–15)
BUN: 14 mg/dL (ref 6–20)
CHLORIDE: 106 mmol/L (ref 101–111)
CO2: 24 mmol/L (ref 22–32)
Calcium: 9.5 mg/dL (ref 8.9–10.3)
Creatinine, Ser: 0.75 mg/dL (ref 0.44–1.00)
GFR calc Af Amer: >60 mL/min (ref >60)
GFR calc non Af Amer: >60 mL/min (ref >60)
GLUCOSE: 115 mg/dL — AB (ref 65–99)
POTASSIUM: 3.6 mmol/L (ref 3.5–5.1)
Sodium: 141 mmol/L (ref 135–145)

## 2016-04-19 LAB — TROPONIN I: Troponin I: <0.03 ng/mL (ref <0.031)

## 2016-04-19 LAB — FIBRIN DERIVATIVES D-DIMER (ARMC ONLY): FIBRIN DERIVATIVES D-DIMER (ARMC): 603 — AB (ref 0–499)

## 2016-04-19 MED ORDER — LORAZEPAM 2 MG/ML IJ SOLN
1.0000 mg | Freq: Once | INTRAMUSCULAR | Status: AC
  Administered 2016-04-19: 1 mg via INTRAVENOUS
  Filled 2016-04-19: qty 1

## 2016-04-19 MED ORDER — IOPAMIDOL (ISOVUE-370) INJECTION 76%
100.0000 mL | Freq: Once | INTRAVENOUS | Status: AC | PRN
  Administered 2016-04-19: 100 mL via INTRAVENOUS

## 2016-04-19 MED ORDER — LIDOCAINE 5 % EX PTCH: 1.0000 | MEDICATED_PATCH | CUTANEOUS | Status: DC

## 2016-04-19 MED ORDER — KETOROLAC TROMETHAMINE 30 MG/ML IJ SOLN
15.0000 mg | Freq: Once | INTRAMUSCULAR | Status: AC
  Administered 2016-04-19: 15 mg via INTRAVENOUS
  Filled 2016-04-19: qty 1

## 2016-04-19 NOTE — ED Notes (Addendum)
Pt states SOB and left sided chest pain and rib pain since yesterday, states left arm tingling, pt on 2L [place] for COPD, states hx of seizures and states she was "smelling weird smells today", pt awake and alert

## 2016-04-19 NOTE — ED Provider Notes (Signed)
Time Seen: Approximately 1810 I have reviewed the triage notes  Chief Complaint: Chest Pain and Shortness of Breath   History of Present Illness: Nicole Wyatt is a 50 y.o. female who presents with a 2 day history of some left-sided chest discomfort. She points to a specific area below her left breast. She has not noticed any rashes. She states it does hurt her to take a deep breath. She does not feel particularly worse as far as her chronic shortness of breath. She states that she has a history of chronic obstructive pulmonary disease and is on home oxygen therapy at 2 L. Patient denies any right-sided chest discomfort. She denies any productive cough or wheezing. She states she has a history of chronic seizures and has had some aura type symptoms today but has not had an actual seizure. Currently on seizure medication. Patient denies any leg pain or swelling.   Past Medical History  Diagnosis Date  . Depression   . Bipolar 1 disorder (HCC)   . Oxygen dependent   . Arthritis   . Seizures (HCC) 05/2013  . COPD (chronic obstructive pulmonary disease) [facility])     Patient Active Problem List   Diagnosis Date Noted  . Skin lesion of right arm 03/01/2016  . Encounter for screening colonoscopy 06/10/2015  . Skin lesion of breast 06/10/2015    Past Surgical History  Procedure Laterality Date  . Abdominal hysterectomy  1999  . Colonoscopy    . Colonoscopy N/A 07/19/2015    Procedure: COLONOSCOPY;  Surgeon: Earline Mayotte, MD;  Location: [facility] ENDOSCOPY;  Service: Endoscopy;  Laterality: N/A;    Past Surgical History  Procedure Laterality Date  . Abdominal hysterectomy  1999  . Colonoscopy    . Colonoscopy N/A 07/19/2015    Procedure: COLONOSCOPY;  Surgeon: Earline Mayotte, MD;  Location: [facility] ENDOSCOPY;  Service: Endoscopy;  Laterality: N/A;    Current Outpatient Rx  Name  Route  Sig  Dispense  Refill  . aspirin EC 81 MG tablet   Oral   Take 81 mg by mouth daily.          Marland Kitchen EPINEPHrine (EPIPEN 2-PAK) 0.3 mg/0.3 mL IJ SOAJ injection   Intramuscular   Inject 0.3 mg into the muscle once. As needed for allergic reaction.         . Eslicarbazepine Acetate (APTIOM) 800 MG TABS   Oral   Take 800 mg by mouth daily.         . furosemide (LASIX) 40 MG tablet   Oral   Take 40 mg by mouth daily.         Marland Kitchen ibuprofen (ADVIL,MOTRIN) 800 MG tablet   Oral   Take 1 tablet (800 mg total) by mouth 3 (three) times daily. Patient not taking: Reported on 02/29/2016   30 tablet   0   . isosorbide mononitrate (IMDUR) 30 MG 24 hr tablet   Oral   Take 30 mg by mouth daily.         . Lacosamide (VIMPAT) 100 MG TABS   Oral   Take 1 tablet (100 mg total) by mouth 2 (two) times daily.   60 tablet   1   . levETIRAcetam (KEPPRA) 750 MG tablet   Oral   Take 1,500 mg by mouth 2 (two) times daily.         Marland Kitchen lidocaine (LIDODERM) 5 %   Transdermal   Place 1 patch onto the skin daily. Remove &  Discard patch within 12 hours or as directed by MD   10 patch   0   . meloxicam (MOBIC) 15 MG tablet   Oral   Take 15 mg by mouth daily.         . OXYGEN   Inhalation   Inhale 2 L into the lungs daily.         Marland Kitchen venlafaxine XR (EFFEXOR-XR) 37.5 MG 24 hr capsule   Oral   Take 37.5 mg by mouth daily with breakfast.           Allergies:  Oxycodone and Percocet  Family History: Family History  Problem Relation Age of Onset  . Hypertension Mother   . Hypertension Father     Social History: Social History  Substance Use Topics  . Smoking status: Former Smoker -- 1.00 packs/day for 14 years    Types: Cigarettes  . Smokeless tobacco: Never Used  . Alcohol Use: No     Review of Systems:   10 point review of systems was performed and was otherwise negative:  Constitutional: No fever Eyes: No visual disturbances ENT: No sore throat, ear pain Cardiac: No chest pain Respiratory: No shortness of breath, wheezing, or stridor Abdomen: No abdominal  pain, no vomiting, No diarrhea Endocrine: No weight loss, No night sweats Extremities: No peripheral edema, cyanosis Skin: No rashes, easy bruising Neurologic: No focal weakness, trouble with speech or swollowing Urologic: No dysuria, Hematuria, or urinary frequency   Physical Exam:  ED Triage Vitals  Enc Vitals Group     BP 04/19/16 1744 122/73 mmHg     Pulse Rate 04/19/16 1744 72     Resp 04/19/16 1744 24     Temp 04/19/16 1744 98.5 F (36.9 C)     Temp Source 04/19/16 1744 Oral     SpO2 04/19/16 1744 94 %     Weight 04/19/16 1744 269 lb (122.018 kg)     Height 04/19/16 1744 5\' 7"  (1.702 m)     Head Cir --      Peak Flow --      Pain Score 04/19/16 1745 9     Pain Loc --      Pain Edu? --      Excl. in GC? --     General: Awake , Alert , and Oriented times 3; GCS 15 Head: Normal cephalic , atraumatic Eyes: Pupils equal , round, reactive to light Nose/Throat: No nasal drainage, patent upper airway without erythema or exudate.  Neck: Supple, Full range of motion, No anterior adenopathy or palpable thyroid masses Lungs: Clear to ascultation without wheezes , rhonchi, or rales Heart: Regular rate, regular rhythm without murmurs , gallops , or rubs Abdomen: Soft, non tender without rebound, guarding , or rigidity; bowel sounds positive and symmetric in all 4 quadrants. No organomegaly .        Extremities: 2 plus symmetric pulses. No edema, clubbing or cyanosis Neurologic: normal ambulation, Motor symmetric without deficits, sensory intact Skin: warm, dry, no rashes Patient has point tenderness in the left chest wall area at the level of T6-T8 with no overlying rash or lesion point tender mid rib.  Labs:   All laboratory work was reviewed including any pertinent negatives or positives listed below:  Labs Reviewed  BASIC METABOLIC PANEL - Abnormal; Notable for the following:    Glucose, Bld 115 (*)    All other components within normal limits  CBC - Abnormal; Notable for  the following:  RDW 17.8 (*)    All other components within normal limits  FIBRIN DERIVATIVES D-DIMER ([facility] ONLY) - Abnormal; Notable for the following:    Fibrin derivatives D-dimer ([facility]) 603 (*)    All other components within normal limits  TROPONIN I    EKG: ED ECG REPORT I, Jennye Moccasin, the attending physician, personally viewed and interpreted this ECG.  Date: 04/19/2016 EKG Time: *1742 Rate: *71 Rhythm: normal sinus rhythm QRS Axis: Right bundle-branch block  Left anterior fascicular block Intervals: normal ST/T Wave abnormalities: normal Conduction Disturbances: none Narrative Interpretation: unremarkable Right bundle-branch block findings appear new in observing EKG from 09-23-2015 No acute ischemic changes are noted   Radiology:   EXAM: CT ANGIOGRAPHY CHEST WITH CONTRAST  TECHNIQUE: Multidetector CT imaging of the chest was performed using the standard protocol during bolus administration of intravenous contrast. Multiplanar CT image reconstructions and MIPs were obtained to evaluate the vascular anatomy.  CONTRAST: 100 mL Isovue 370  COMPARISON: Chest x-ray April 19, 2016  FINDINGS: Mediastinum/Lymph Nodes: No pulmonary emboli or thoracic aortic dissection identified. No masses or pathologically enlarged lymph nodes identified.  Lungs/Pleura: No pulmonary mass, infiltrate, or effusion. Minimal atelectasis of posterior lung bases are noted.  Upper abdomen: No acute findings. There is a small hiatal hernia.  Musculoskeletal: No chest wall mass or suspicious bone lesions identified.  Review of the MIP images confirms the above findings.  IMPRESSION: No pulmonary embolus.  No acute abnormality identified in the chest.   Electronically Signed By: Sherian Rein M.D. On: 04/19/2016 20:17          DG Chest 2 View (Final result) Result time: 04/19/16 18:49:03   Final result by Rad Results In Interface (04/19/16 18:49:03)    Narrative:   CLINICAL DATA: Shortness of breath. Left-sided chest pain and rib pain starting yesterday. COPD.  EXAM: CHEST 2 VIEW  COMPARISON: Multiple exams, including 04/02/2016  FINDINGS: Subsegmental atelectasis or scarring along the right hemidiaphragm. Thoracic spondylosis. Cardiac and mediastinal margins appear normal.  No pleural effusion. The lungs appear otherwise clear.  IMPRESSION: 1. No specific cause for chest pain is identified. 2. Minimal scarring or atelectasis along the right hemidiaphragm, similar to the recent prior exam. 3. Thoracic spondylosis.   Electronically Signed By: Gaylyn Rong M.D.       I personally reviewed the radiologic studies   P ED Course: * Patient's workup was benign for her troponin. She does have some new findings of a right bundle branch block on her EKG. There does not appear to be any ischemic changes, but I did discuss this with the patient. I felt her presentation today was noncardiac and with a negative chest CT I felt that are borderline elevated d-dimer we had adequately ruled out pulmonary embolism or other intrathoracic causes. Her pain is very reproducible and this may be either musculoskeletal pain or early shingles. Patient was advised contact her primary physician and her cardiologist for further outpatient follow-up.    Assessment:  Acute nonspecific chest pain Chest wall pain   Final Clinical Impression:  Final diagnoses:  Chest pain     Plan:  Outpatient management I prescribed lidocaine patches for the chest wall pain and advised follow-up with her primary physician and/or cardiologist. Patient was advised to return immediately if condition worsens. Patient was advised to follow up with their primary care physician or other specialized physicians involved in their outpatient care. The patient and/or family member/power of attorney had laboratory results reviewed at the bedside.  All questions  and concerns were addressed and appropriate discharge instructions were distributed by the nursing staff.            Jennye MoccasinBrian S Quigley, MD 04/19/16 2105

## 2016-04-19 NOTE — ED Notes (Signed)
Dr. Quigley at bedside.  

## 2016-04-19 NOTE — ED Notes (Signed)
Patient transported to X-ray 

## 2016-04-19 NOTE — ED Notes (Signed)
Pt transported to CT via stretcher.  

## 2016-04-19 NOTE — ED Notes (Signed)
Pt provided ginger ale and crushed ice, ok per Dr. Huel CoteQuigley.

## 2016-04-19 NOTE — Discharge Instructions (Signed)
Chest Wall Pain Chest wall pain is pain in or around the bones and muscles of your chest. Sometimes, an injury causes this pain. Sometimes, the cause may not be known. This pain may take several weeks or longer to get better. HOME CARE Pay attention to any changes in your symptoms. Take these actions to help with your pain:  Rest as told by your doctor.  Avoid activities that cause pain. Try not to use your chest, belly (abdominal), or side muscles to lift heavy things.  If directed, apply ice to the painful area:  Put ice in a plastic bag.  Place a towel between your skin and the bag.  Leave the ice on for 20 minutes, 2-3 times per day.  Take over-the-counter and prescription medicines only as told by your doctor.  Do not use tobacco products, including cigarettes, chewing tobacco, and e-cigarettes. If you need help quitting, ask your doctor.  Keep all follow-up visits as told by your doctor. This is important. GET HELP IF:  You have a fever.  Your chest pain gets worse.  You have new symptoms. GET HELP RIGHT AWAY IF:  You feel sick to your stomach (nauseous) or you throw up (vomit).  You feel sweaty or light-headed.  You have a cough with phlegm (sputum) or you cough up blood.  You are short of breath.   This information is not intended to replace advice given to you by your health care provider. Make sure you discuss any questions you have with your health care provider.   Document Released: 05/28/2008 Document Revised: 08/31/2015 Document Reviewed: 03/07/2015 Elsevier Interactive Patient Education 2016 [employer]  Nonspecific Chest Pain It is often hard to find the cause of chest pain. There is always a chance that your pain could be related to something serious, such as a heart attack or a blood clot in your lungs. Chest pain can also be caused by conditions that are not life-threatening. If you have chest pain, it is very important to follow up with your  doctor.  HOME CARE  If you were prescribed an antibiotic medicine, finish it all even if you start to feel better.  Avoid any activities that cause chest pain.  Do not use any tobacco products, including cigarettes, chewing tobacco, or electronic cigarettes. If you need help quitting, ask your doctor.  Do not drink alcohol.  Take medicines only as told by your doctor.  Keep all follow-up visits as told by your doctor. This is important. This includes any further testing if your chest pain does not go away.  Your doctor may tell you to keep your head raised (elevated) while you sleep.  Make lifestyle changes as told by your doctor. These may include:  Getting regular exercise. Ask your doctor to suggest some activities that are safe for you.  Eating a heart-healthy diet. Your doctor or a diet specialist (dietitian) can help you to learn healthy eating options.  Maintaining a healthy weight.  Managing diabetes, if necessary.  Reducing stress. GET HELP IF:  Your chest pain does not go away, even after treatment.  You have a rash with blisters on your chest.  You have a fever. GET HELP RIGHT AWAY IF:  Your chest pain is worse.  You have an increasing cough, or you cough up blood.  You have severe belly (abdominal) pain.  You feel extremely weak.  You pass out (faint).  You have chills.  You have sudden, unexplained chest discomfort.  You have  sudden, unexplained discomfort in your arms, back, neck, or jaw.  You have shortness of breath at any time.  You suddenly start to sweat, or your skin gets clammy.  You feel nauseous.  You vomit.  You suddenly feel light-headed or dizzy.  Your heart begins to beat quickly, or it feels like it is skipping beats. These symptoms may be an emergency. Do not wait to see if the symptoms will go away. Get medical help right away. Call your local emergency services (911 in the [place] Do not drive yourself to the hospital.    This information is not intended to replace advice given to you by your health care provider. Make sure you discuss any questions you have with your health care provider.   Document Released: 05/28/2008 Document Revised: 12/31/2014 Document Reviewed: 07/16/2014 Elsevier Interactive Patient Education [employer]

## 2016-04-30 DIAGNOSIS — R0602 Shortness of breath: Secondary | ICD-10-CM | POA: Insufficient documentation

## 2016-05-22 DIAGNOSIS — R600 Localized edema: Secondary | ICD-10-CM | POA: Insufficient documentation

## 2016-05-24 DIAGNOSIS — I200 Unstable angina: Secondary | ICD-10-CM | POA: Diagnosis present

## 2016-05-29 ENCOUNTER — Encounter
Admission: RE | Disposition: A | Discharge status: Home / Self Care | Payer: Self-pay | Source: Ambulatory Visit | Attending: Internal Medicine

## 2016-05-29 ENCOUNTER — Ambulatory Visit
Admission: RE | Admit: 2016-05-29 | Discharge: 2016-05-29 | Disposition: A | Discharge status: Home / Self Care | Payer: BLUE CROSS/BLUE SHIELD | Source: Ambulatory Visit | Attending: Internal Medicine | Admitting: Internal Medicine

## 2016-05-29 DIAGNOSIS — Z820 Family history of epilepsy and other diseases of the nervous system: Secondary | ICD-10-CM | POA: Insufficient documentation

## 2016-05-29 DIAGNOSIS — G4730 Sleep apnea, unspecified: Secondary | ICD-10-CM | POA: Diagnosis not present

## 2016-05-29 DIAGNOSIS — Z791 Long term (current) use of non-steroidal anti-inflammatories (NSAID): Secondary | ICD-10-CM | POA: Diagnosis not present

## 2016-05-29 DIAGNOSIS — I200 Unstable angina: Secondary | ICD-10-CM | POA: Diagnosis not present

## 2016-05-29 DIAGNOSIS — Z8249 Family history of ischemic heart disease and other diseases of the circulatory system: Secondary | ICD-10-CM | POA: Diagnosis not present

## 2016-05-29 DIAGNOSIS — Z7982 Long term (current) use of aspirin: Secondary | ICD-10-CM | POA: Diagnosis not present

## 2016-05-29 DIAGNOSIS — Z9889 Other specified postprocedural states: Secondary | ICD-10-CM | POA: Insufficient documentation

## 2016-05-29 DIAGNOSIS — I10 Essential (primary) hypertension: Secondary | ICD-10-CM | POA: Insufficient documentation

## 2016-05-29 DIAGNOSIS — Z6841 Body Mass Index (BMI) 40.0 and over, adult: Secondary | ICD-10-CM | POA: Insufficient documentation

## 2016-05-29 DIAGNOSIS — I272 Other secondary pulmonary hypertension: Secondary | ICD-10-CM | POA: Insufficient documentation

## 2016-05-29 DIAGNOSIS — F329 Major depressive disorder, single episode, unspecified: Secondary | ICD-10-CM | POA: Insufficient documentation

## 2016-05-29 DIAGNOSIS — Z87891 Personal history of nicotine dependence: Secondary | ICD-10-CM | POA: Insufficient documentation

## 2016-05-29 DIAGNOSIS — Z885 Allergy status to narcotic agent status: Secondary | ICD-10-CM | POA: Diagnosis not present

## 2016-05-29 DIAGNOSIS — R251 Tremor, unspecified: Secondary | ICD-10-CM | POA: Diagnosis not present

## 2016-05-29 DIAGNOSIS — R0602 Shortness of breath: Secondary | ICD-10-CM | POA: Insufficient documentation

## 2016-05-29 DIAGNOSIS — Z90710 Acquired absence of both cervix and uterus: Secondary | ICD-10-CM | POA: Diagnosis not present

## 2016-05-29 DIAGNOSIS — Z880 Allergy status to penicillin: Secondary | ICD-10-CM | POA: Insufficient documentation

## 2016-05-29 DIAGNOSIS — E669 Obesity, unspecified: Secondary | ICD-10-CM | POA: Diagnosis not present

## 2016-05-29 DIAGNOSIS — Z836 Family history of other diseases of the respiratory system: Secondary | ICD-10-CM | POA: Insufficient documentation

## 2016-05-29 DIAGNOSIS — Z91030 Bee allergy status: Secondary | ICD-10-CM | POA: Diagnosis not present

## 2016-05-29 DIAGNOSIS — Z79899 Other long term (current) drug therapy: Secondary | ICD-10-CM | POA: Insufficient documentation

## 2016-05-29 HISTORY — PX: CARDIAC CATHETERIZATION: SHX172

## 2016-05-29 SURGERY — RIGHT/LEFT HEART CATH AND CORONARY ANGIOGRAPHY
Anesthesia: Moderate Sedation | Laterality: Bilateral

## 2016-05-29 MED ORDER — FENTANYL CITRATE (PF) 100 MCG/2ML IJ SOLN
INTRAMUSCULAR | Status: AC
  Filled 2016-05-29: qty 2

## 2016-05-29 MED ORDER — HEPARIN (PORCINE) IN NACL 2-0.9 UNIT/ML-% IJ SOLN
INTRAMUSCULAR | Status: AC
  Filled 2016-05-29: qty 500

## 2016-05-29 MED ORDER — MIDAZOLAM HCL 2 MG/2ML IJ SOLN
INTRAMUSCULAR | Status: DC | PRN
  Administered 2016-05-29 (×2): 1 mg via INTRAVENOUS

## 2016-05-29 MED ORDER — IOPAMIDOL (ISOVUE-300) INJECTION 61%
INTRAVENOUS | Status: DC | PRN
  Administered 2016-05-29: 80 mL via INTRA_ARTERIAL

## 2016-05-29 MED ORDER — MIDAZOLAM HCL 2 MG/2ML IJ SOLN
INTRAMUSCULAR | Status: AC
  Filled 2016-05-29: qty 2

## 2016-05-29 SURGICAL SUPPLY — 13 items
CATH INFINITI 5FR ANG PIGTAIL (CATHETERS) ×2 IMPLANT
CATH INFINITI 5FR JL4 (CATHETERS) ×2 IMPLANT
CATH INFINITI JR4 5F (CATHETERS) ×2 IMPLANT
CATH SWANZ 7F THERMO (CATHETERS) ×2 IMPLANT
DEVICE CLOSURE MYNXGRIP 5F (Vascular Products) ×2 IMPLANT
KIT MANI 3VAL PERCEP (MISCELLANEOUS) ×3 IMPLANT
KIT RIGHT HEART (MISCELLANEOUS) ×3 IMPLANT
NDL PERC 18GX7CM (NEEDLE) IMPLANT
NEEDLE PERC 18GX7CM (NEEDLE) ×3 IMPLANT
PACK CARDIAC CATH (CUSTOM PROCEDURE TRAY) ×3 IMPLANT
SHEATH PINNACLE 5F 10CM (SHEATH) ×2 IMPLANT
SHEATH PINNACLE 7F 10CM (SHEATH) ×2 IMPLANT
WIRE EMERALD 3MM-J .035X150CM (WIRE) ×2 IMPLANT

## 2016-05-29 NOTE — Discharge Instructions (Signed)

## 2016-05-30 ENCOUNTER — Encounter: Payer: Self-pay | Admitting: Internal Medicine

## 2016-06-18 DIAGNOSIS — Z9889 Other specified postprocedural states: Secondary | ICD-10-CM | POA: Insufficient documentation

## 2016-06-18 DIAGNOSIS — I272 Pulmonary hypertension, unspecified: Secondary | ICD-10-CM | POA: Insufficient documentation

## 2016-07-23 ENCOUNTER — Other Ambulatory Visit: Payer: Self-pay | Admitting: Physician Assistant

## 2016-07-23 ENCOUNTER — Ambulatory Visit
Admission: RE | Admit: 2016-07-23 | Discharge: 2016-07-23 | Disposition: A | Discharge status: Home / Self Care | Payer: BLUE CROSS/BLUE SHIELD | Source: Ambulatory Visit | Attending: Physician Assistant | Admitting: Physician Assistant

## 2016-07-23 DIAGNOSIS — R590 Localized enlarged lymph nodes: Secondary | ICD-10-CM | POA: Insufficient documentation

## 2016-07-23 DIAGNOSIS — E278 Other specified disorders of adrenal gland: Secondary | ICD-10-CM | POA: Diagnosis not present

## 2016-07-23 DIAGNOSIS — R0602 Shortness of breath: Secondary | ICD-10-CM

## 2016-07-23 DIAGNOSIS — R918 Other nonspecific abnormal finding of lung field: Secondary | ICD-10-CM | POA: Insufficient documentation

## 2016-07-23 MED ORDER — IOPAMIDOL (ISOVUE-370) INJECTION 76%
75.0000 mL | Freq: Once | INTRAVENOUS | Status: AC | PRN
  Administered 2016-07-23: 75 mL via INTRAVENOUS

## 2016-07-27 ENCOUNTER — Encounter
Admission: RE | Disposition: A | Discharge status: Home / Self Care | Payer: Self-pay | Source: Ambulatory Visit | Attending: Internal Medicine

## 2016-07-27 ENCOUNTER — Ambulatory Visit
Admission: RE | Admit: 2016-07-27 | Discharge: 2016-07-27 | Disposition: A | Discharge status: Home / Self Care | Payer: BLUE CROSS/BLUE SHIELD | Source: Ambulatory Visit | Attending: Internal Medicine | Admitting: Internal Medicine

## 2016-07-27 ENCOUNTER — Encounter: Payer: Self-pay | Admitting: *Deleted

## 2016-07-27 DIAGNOSIS — Z885 Allergy status to narcotic agent status: Secondary | ICD-10-CM | POA: Diagnosis not present

## 2016-07-27 DIAGNOSIS — F319 Bipolar disorder, unspecified: Secondary | ICD-10-CM | POA: Diagnosis not present

## 2016-07-27 DIAGNOSIS — Z791 Long term (current) use of non-steroidal anti-inflammatories (NSAID): Secondary | ICD-10-CM | POA: Insufficient documentation

## 2016-07-27 DIAGNOSIS — R918 Other nonspecific abnormal finding of lung field: Secondary | ICD-10-CM | POA: Diagnosis not present

## 2016-07-27 DIAGNOSIS — Z9889 Other specified postprocedural states: Secondary | ICD-10-CM | POA: Insufficient documentation

## 2016-07-27 DIAGNOSIS — Z91030 Bee allergy status: Secondary | ICD-10-CM | POA: Insufficient documentation

## 2016-07-27 DIAGNOSIS — Z880 Allergy status to penicillin: Secondary | ICD-10-CM | POA: Insufficient documentation

## 2016-07-27 DIAGNOSIS — M1990 Unspecified osteoarthritis, unspecified site: Secondary | ICD-10-CM | POA: Insufficient documentation

## 2016-07-27 DIAGNOSIS — R569 Unspecified convulsions: Secondary | ICD-10-CM | POA: Diagnosis not present

## 2016-07-27 DIAGNOSIS — J918 Pleural effusion in other conditions classified elsewhere: Secondary | ICD-10-CM | POA: Insufficient documentation

## 2016-07-27 DIAGNOSIS — Z8249 Family history of ischemic heart disease and other diseases of the circulatory system: Secondary | ICD-10-CM | POA: Diagnosis not present

## 2016-07-27 DIAGNOSIS — R590 Localized enlarged lymph nodes: Secondary | ICD-10-CM | POA: Insufficient documentation

## 2016-07-27 DIAGNOSIS — Z79899 Other long term (current) drug therapy: Secondary | ICD-10-CM | POA: Insufficient documentation

## 2016-07-27 DIAGNOSIS — Z90710 Acquired absence of both cervix and uterus: Secondary | ICD-10-CM | POA: Insufficient documentation

## 2016-07-27 DIAGNOSIS — J449 Chronic obstructive pulmonary disease, unspecified: Secondary | ICD-10-CM | POA: Insufficient documentation

## 2016-07-27 DIAGNOSIS — R0602 Shortness of breath: Secondary | ICD-10-CM | POA: Insufficient documentation

## 2016-07-27 DIAGNOSIS — Z9981 Dependence on supplemental oxygen: Secondary | ICD-10-CM | POA: Insufficient documentation

## 2016-07-27 HISTORY — PX: FLEXIBLE BRONCHOSCOPY: SHX5094

## 2016-07-27 SURGERY — BRONCHOSCOPY, FLEXIBLE
Anesthesia: Moderate Sedation

## 2016-07-27 MED ORDER — MIDAZOLAM HCL 5 MG/5ML IJ SOLN
INTRAMUSCULAR | Status: AC | PRN
  Administered 2016-07-27: 2 mg via INTRAVENOUS
  Administered 2016-07-27 (×2): 1 mg via INTRAVENOUS

## 2016-07-27 MED ORDER — BUTAMBEN-TETRACAINE-BENZOCAINE 2-2-14 % EX AERO
INHALATION_SPRAY | CUTANEOUS | Status: AC
  Filled 2016-07-27: qty 20

## 2016-07-27 MED ORDER — FENTANYL CITRATE (PF) 100 MCG/2ML IJ SOLN
INTRAMUSCULAR | Status: AC
  Filled 2016-07-27: qty 4

## 2016-07-27 MED ORDER — FENTANYL CITRATE (PF) 100 MCG/2ML IJ SOLN
INTRAMUSCULAR | Status: AC | PRN
  Administered 2016-07-27 (×2): 50 ug via INTRAVENOUS

## 2016-07-27 MED ORDER — MIDAZOLAM HCL 5 MG/5ML IJ SOLN
INTRAMUSCULAR | Status: AC
  Filled 2016-07-27: qty 10

## 2016-07-27 NOTE — Op Note (Signed)
[facility] Patient Name: Nicole Wyatt Procedure Date: 07/27/2016 8:34 AM MRN: [id number] Account #: [id number] Date of Birth: October 15, 1966 Admit Type: Outpatient Age: 50 Room: [facility] PROCEDURE RM 01 Gender: Female Note Status: Finalized Attending MD: Yevonne Pax , MD Procedure:         Bronchoscopy Indications:       Infiltrate Providers:         Yevonne Pax, MD Referring MD:       Medicines:         Midazolam 3 mg mg IV, Fentanyl 100 mcg IV Complications:     No immediate complications Procedure:         Pre-Anesthesia Assessment:                    - A History and Physical has been performed. The patient's                     medications, allergies and sensitivities have been                     reviewed.                    - The risks and benefits of the procedure and the sedation                     options and risks were discussed with the patient. All                     questions were answered and informed consent was obtained.                    - Pre-procedure physical examination revealed no                     contraindications to sedation.                    - ASA Grade Assessment: II - A patient with mild systemic                     disease.                    - After reviewing the risks and benefits, the patient was                     deemed in satisfactory condition to undergo the procedure.                    - The anesthesia plan was to use moderate                     sedation/analgesia.                    - Immediately prior to administration of medications, the                     patient was re-assessed for adequacy to receive sedatives.                    - The heart rate, respiratory rate, oxygen saturations,                     blood pressure, adequacy of pulmonary ventilation, and  response to care were monitored throughout the procedure.                    - The physical status of the patient was re-assessed  after                     the procedure.                    After obtaining informed consent, the bronchoscope was                     passed under direct vision. Throughout the procedure, the                     patient's blood pressure, pulse, and oxygen saturations                     were monitored continuously. the Bronchoscope Olympus                     [id number] was introduced through the and                     advanced to the tracheobronchial tree of both lungs. The                     procedure was accomplished without difficulty. The patient                     tolerated the procedure well. The total duration of the                     procedure was 20 minutes. Findings:      The nasopharynx/oropharynx appears normal. The larynx appears normal.       The vocal cords appear normal. The subglottic space is normal. The       trachea is of normal caliber. The carina is sharp. The tracheobronchial       tree was examined to at least the first subsegmental level. Bronchial       mucosa and anatomy are normal; there are no endobronchial lesions, and       no secretions.      Washings were obtained in the lateral segment of the right middle lobe,       in the medial segment of the right middle lobe, in the superior segment       of the right lower lobe and in the medial basal segment of the right       lower lobe and sent for cell count, bacterial culture, viral smears &       culture, and fungal & AFB analysis. The return was mucoid.      The nasopharynx/oropharynx appears normal. The larynx appears normal.       The vocal cords appear normal. The subglottic space is normal. The       trachea is of normal caliber. The carina is sharp. The tracheobronchial       tree was examined to at least the first subsegmental level. Bronchial       mucosa and anatomy are normal; there are no endobronchial lesions, and       no secretions. Impression:        - Infiltrate                     -  The examination was normal.                    - Washings were obtained.                    - The examination was normal. Recommendation:    - Await washing results. Freda Munro, MD Yevonne Pax, MD 07/27/2016 9:25:51 AM This report has been signed electronically. Number of Addenda: 0 Note Initiated On: 07/27/2016 8:34 AM      [facility]

## 2016-07-27 NOTE — Discharge Instructions (Signed)
Flexible Bronchoscopy, Care After °These instructions give you information on caring for yourself after your procedure. Your doctor may also give you more specific instructions. Call your doctor if you have any problems or questions after your procedure. °HOME CARE °· Do not eat or drink anything for 2 hours after your procedure. If you try to eat or drink before the medicine wears off, food or drink could go into your lungs. You could also burn yourself. °· After 2 hours have passed and when you can cough and gag normally, you may eat soft food and drink liquids slowly. °· The day after the test, you may eat your normal diet. °· You may do your normal activities. °· Keep all doctor visits. °GET HELP RIGHT AWAY IF: °· You get more and more short of breath. °· You get light-headed. °· You feel like you are going to pass out (faint). °· You have chest pain. °· You have new problems that worry you. °· You cough up more than a little blood. °· You cough up more blood than before. °MAKE SURE YOU: °· Understand these instructions. °· Will watch your condition. °· Will get help right away if you are not doing well or get worse. °  °This information is not intended to replace advice given to you by your health care provider. Make sure you discuss any questions you have with your health care provider. °  °Document Released: 10/07/2009 Document Revised: 12/15/2013 Document Reviewed: 08/14/2013 °Elsevier Interactive Patient Education ©2016 [employer] ° °

## 2016-07-28 LAB — ACID FAST SMEAR (AFB): ACID FAST SMEAR - AFSCU2: NEGATIVE

## 2016-07-28 LAB — ACID FAST SMEAR (AFB, MYCOBACTERIA)

## 2016-07-29 LAB — CULTURE, RESPIRATORY W GRAM STAIN

## 2016-07-29 LAB — CULTURE, RESPIRATORY: CULTURE: NORMAL

## 2016-07-31 ENCOUNTER — Emergency Department
Admission: EM | Admit: 2016-07-31 | Discharge: 2016-07-31 | Disposition: A | Discharge status: Home / Self Care | Payer: BLUE CROSS/BLUE SHIELD | Attending: Emergency Medicine | Admitting: Emergency Medicine

## 2016-07-31 ENCOUNTER — Emergency Department: Payer: BLUE CROSS/BLUE SHIELD

## 2016-07-31 DIAGNOSIS — R0602 Shortness of breath: Secondary | ICD-10-CM | POA: Diagnosis present

## 2016-07-31 DIAGNOSIS — Z87891 Personal history of nicotine dependence: Secondary | ICD-10-CM | POA: Diagnosis not present

## 2016-07-31 DIAGNOSIS — J449 Chronic obstructive pulmonary disease, unspecified: Secondary | ICD-10-CM | POA: Insufficient documentation

## 2016-07-31 DIAGNOSIS — R0989 Other specified symptoms and signs involving the circulatory and respiratory systems: Secondary | ICD-10-CM | POA: Diagnosis not present

## 2016-07-31 LAB — CBC
HCT: 38.3 % (ref 35.0–47.0)
Hemoglobin: 13.5 g/dL (ref 12.0–16.0)
MCH: 30.2 pg (ref 26.0–34.0)
MCHC: 35.2 g/dL (ref 32.0–36.0)
MCV: 85.9 fL (ref 80.0–100.0)
Platelets: 327 K/uL (ref 150–440)
RBC: 4.46 MIL/uL (ref 3.80–5.20)
RDW: 17.7 % — ABNORMAL HIGH (ref 11.5–14.5)
WBC: 12.5 K/uL — ABNORMAL HIGH (ref 3.6–11.0)

## 2016-07-31 LAB — BASIC METABOLIC PANEL WITH GFR
Anion gap: 9 (ref 5–15)
BUN: 8 mg/dL (ref 6–20)
CO2: 21 mmol/L — ABNORMAL LOW (ref 22–32)
Calcium: 9.3 mg/dL (ref 8.9–10.3)
Chloride: 108 mmol/L (ref 101–111)
Creatinine, Ser: 0.72 mg/dL (ref 0.44–1.00)
GFR calc Af Amer: >60 mL/min (ref >60)
GFR calc non Af Amer: >60 mL/min (ref >60)
Glucose, Bld: 106 mg/dL — ABNORMAL HIGH (ref 65–99)
Potassium: 3.3 mmol/L — ABNORMAL LOW (ref 3.5–5.1)
Sodium: 138 mmol/L (ref 135–145)

## 2016-07-31 LAB — TROPONIN I: Troponin I: <0.03 ng/mL (ref <0.03)

## 2016-07-31 MED ORDER — FUROSEMIDE 40 MG PO TABS
40.0000 mg | ORAL_TABLET | ORAL | Status: AC
  Administered 2016-07-31: 40 mg via ORAL
  Filled 2016-07-31: qty 1

## 2016-07-31 NOTE — ED Triage Notes (Signed)
Pt to triage via w/c; st hx COPD; Lung biopsy on Friday; hyperventilating upon arrival per [facility]; O2 at 2l/min via [place] all times at home; bilat rib pain

## 2016-07-31 NOTE — ED Provider Notes (Signed)
[facility] Emergency Department Provider Note  ____________________________________________   First MD Initiated Contact with Patient 07/31/16 [phone]     (approximate)  I have reviewed the triage vital signs and the nursing notes.   HISTORY  Chief Complaint Shortness of Breath    HPI Nicole Wyatt is a 50 y.o. female with numerous chronic medical problems as documented below who also sees Dr. Welton FlakesKhan with pulmonology and had a bronchoscopy 4 days ago.  She presents tonight for shortness of breath.  She states it feels similar to her prior episodes of shortness of breath.  She was trying to sleep with her CPAP and oxygen but felt short of breath in spite of using those 2 interventions.  She has been using her breathing treatments like she is supposed to.  She denies chest pain, fever/chills, nausea, vomiting, abdominal pain.  She states that she is supposed to see Dr. Welton FlakesKhan in about a week for a follow-up visit and that he has also referring [facility].She has been taking her furosemide as prescribed as well (40 mg daily).   Past Medical History:  Diagnosis Date  . Arthritis   . Bipolar 1 disorder (HCC)   . COPD (chronic obstructive pulmonary disease) (HCC)   . Depression   . Oxygen dependent   . Seizures (HCC) 05/2013    Patient Active Problem List   Diagnosis Date Noted  . Unstable angina (HCC) 05/24/2016  . Skin lesion of right arm 03/01/2016  . Encounter for screening colonoscopy 06/10/2015  . Skin lesion of breast 06/10/2015    Past Surgical History:  Procedure Laterality Date  . ABDOMINAL HYSTERECTOMY  1999  . CARDIAC CATHETERIZATION Bilateral 05/29/2016   Procedure: Right/Left Heart Cath and Coronary Angiography;  Surgeon: Lamar BlinksBruce J Kowalski, MD;  Location: [facility] INVASIVE CV LAB;  Service: Cardiovascular;  Laterality: Bilateral;  . COLONOSCOPY    . COLONOSCOPY N/A 07/19/2015   Procedure: COLONOSCOPY;   Surgeon: Earline MayotteJeffrey W Byrnett, MD;  [facility];  Service: Endoscopy;  Laterality: N/A;  . FLEXIBLE BRONCHOSCOPY N/A 07/27/2016   Procedure: FLEXIBLE BRONCHOSCOPY;  Surgeon: Yevonne PaxSaadat A Khan, MD;  Location: [facility] ORS;  Service: Pulmonary;  Laterality: N/A;    Prior to Admission medications   Medication Sig Start Date End Date Taking? Authorizing Provider  albuterol (PROVENTIL HFA;VENTOLIN HFA) 108 (90 Base) MCG/ACT inhaler Inhale 2 puffs into the lungs every 4 (four) hours as needed for wheezing or shortness of breath.     Historical Provider, MD  EPINEPHrine (EPIPEN 2-PAK) 0.3 mg/0.3 mL IJ SOAJ injection Inject 0.3 mg into the muscle once. As needed for allergic reaction.    Historical Provider, MD  Eslicarbazepine Acetate (APTIOM) 800 MG TABS Take 800 mg by mouth daily.    Historical Provider, MD  furosemide (LASIX) 40 MG tablet Take 40 mg by mouth daily.    Historical Provider, MD  isosorbide mononitrate (IMDUR) 30 MG 24 hr tablet Take 30 mg by mouth daily.    Historical Provider, MD  Lacosamide (VIMPAT) 100 MG TABS Take 1 tablet (100 mg total) by mouth 2 (two) times daily. 09/25/15   Emily FilbertJonathan E Williams, MD  levETIRAcetam (KEPPRA) 750 MG tablet Take 1,500 mg by mouth 2 (two) times daily.    Historical Provider, MD  meloxicam (MOBIC) 15 MG tablet Take 15 mg by mouth daily.    Historical Provider, MD  mometasone [facility](ASMANEX) 220 MCG/INH inhaler Inhale 2 puffs into the lungs daily.  Historical Provider, MD  OXYGEN Inhale 2 L into the lungs daily.    Historical Provider, MD  potassium chloride SA (K-DUR,KLOR-CON) 20 MEQ tablet Take 1 tablet by mouth daily. 11/24/14   Historical Provider, MD  sertraline (ZOLOFT) 100 MG tablet Take 200 mg by mouth daily.    Historical Provider, MD  venlafaxine XR (EFFEXOR-XR) 37.5 MG 24 hr capsule Take 37.5 mg by mouth daily with breakfast.    Historical Provider, MD    Allergies Bee venom; Penicillins; Oxycodone; and Percocet  [oxycodone-acetaminophen]  Family History  Problem Relation Age of Onset  . Hypertension Mother   . Hypertension Father     Social History Social History  Substance Use Topics  . Smoking status: Former Smoker    Packs/day: 1.00    Years: 14.00    Types: Cigarettes    Quit date: 05/07/2016  . Smokeless tobacco: Never Used  . Alcohol use No    Review of Systems Constitutional: No fever/chills Eyes: No visual changes. ENT: No sore throat. Cardiovascular: Denies chest pain. Respiratory: +shortness of breath. Gastrointestinal: No abdominal pain.  No nausea, no vomiting.  No diarrhea.  No constipation. Genitourinary: Negative for dysuria. Musculoskeletal: Negative for back pain. Skin: Negative for rash. Neurological: Negative for headaches, focal weakness or numbness.  10-point ROS otherwise negative.  ____________________________________________   PHYSICAL EXAM:  VITAL SIGNS: ED Triage Vitals  Enc Vitals Group     BP 07/31/16 0251 125/77     Pulse Rate 07/31/16 0251 68     Resp --      Temp 07/31/16 0251 98 F (36.7 C)     Temp Source 07/31/16 0251 Oral     SpO2 07/31/16 0251 100 %     Weight 07/31/16 0251 290 lb (131.5 kg)     Height 07/31/16 0251 5\' 7"  (1.702 m)     Head Circumference --      Peak Flow --      Pain Score 07/31/16 0249 9     Pain Loc --      Pain Edu? --      Excl. in GC? --     Constitutional: Alert and oriented. Well appearing and in no acute distress. Eyes: Conjunctivae are normal. PERRL. EOMI. Head: Atraumatic. Nose: No congestion/rhinnorhea. Mouth/Throat: Mucous membranes are moist.  Oropharynx non-erythematous. Neck: No stridor.  No meningeal signs.   Cardiovascular: Normal rate, regular rhythm. Good peripheral circulation. Grossly normal heart sounds.   Respiratory: Normal respiratory effort.  No retractions. Lungs CTAB. Gastrointestinal: Morbid obesity.  Soft and nontender. No distention.  Musculoskeletal: No lower extremity  tenderness nor edema. No gross deformities of extremities. Neurologic:  Normal speech and language. No gross focal neurologic deficits are appreciated.  Skin:  Skin is warm, dry and intact. No rash noted. Psychiatric: Mood and affect are normal. Speech and behavior are normal.  ____________________________________________   LABS (all labs ordered are listed, but only abnormal results are displayed)  Labs Reviewed  BASIC METABOLIC PANEL - Abnormal; Notable for the following:       Result Value   Potassium 3.3 (*)    CO2 21 (*)    Glucose, Bld 106 (*)    All other components within normal limits  CBC - Abnormal; Notable for the following:    WBC 12.5 (*)    RDW 17.7 (*)    All other components within normal limits  TROPONIN I   ____________________________________________  EKG  ED ECG REPORT I, Trevyon Swor, the attending  physician, personally viewed and interpreted this ECG.  Date: 07/31/2016 EKG Time: 02:57 Rate: 76 Rhythm: normal sinus rhythm QRS Axis: Left axis deviation Intervals: Right bundle branch block ST/T Wave abnormalities: Inverted T-wave in leads 3, aVF, V3 Conduction Disturbances: none Narrative Interpretation: unremarkable  ____________________________________________  RADIOLOGY   Dg Chest 2 View  Result Date: 07/31/2016 CLINICAL DATA:  Acute onset of shortness of breath. Initial encounter. EXAM: CHEST  2 VIEW COMPARISON:  Chest radiograph performed 04/19/2016, and CT of the chest performed 07/23/2016 FINDINGS: The lungs are well-aerated. Mild vascular congestion is noted. There is no evidence of focal opacification, pleural effusion or pneumothorax. The heart is normal in size; the mediastinal contour is within normal limits. No acute osseous abnormalities are seen. IMPRESSION: Mild vascular congestion noted.  Lungs remain grossly clear. Electronically Signed   By: Roanna Raider M.D.   On: 07/31/2016 03:37     ____________________________________________   PROCEDURES  Procedure(s) performed:   Procedures   Critical Care performed: No ____________________________________________   INITIAL IMPRESSION / ASSESSMENT AND PLAN / ED COURSE  Pertinent labs & imaging results that were available during my care of the patient were reviewed by me and considered in my medical decision making (see chart for details).  The patient is well-appearing and in no acute distress at this time.  She has no abnormal lung sounds currently including no wheezing.  Her chest x-ray suggests some mild vascular congestion.  I suspect that restrictive airway disease due to her body habitus will try to sleep is also fracturing into her chronic lung disease and that she becomes anxious and was hyperventilating according to EMS.  I am giving her an extra dose of furosemide 40 mg by mouth and encouraged her to follow up with her pulmonologist at the next available opportunity.  Her lab workup is reassuring and she has close follow-up available.  She has had recent extensive workups including chest CTs, repeat lab work, and recent bronchoscopy which have all been nonacute.  Her vital signs evolve in stable while in the emergency department and she is in no respiratory distress at this time.  Her EKG is unchanged from prior.  I do not believe that her symptoms represent an acute or emergent medical issue tonight.  She understands and agrees with the plan of outpatient follow-up.      ____________________________________________  FINAL CLINICAL IMPRESSION(S) / ED DIAGNOSES  Final diagnoses:  SOB (shortness of breath)  Pulmonary vascular congestion     MEDICATIONS GIVEN DURING THIS VISIT:  Medications  furosemide (LASIX) tablet 40 mg (not administered)     NEW OUTPATIENT MEDICATIONS STARTED DURING THIS VISIT:  New Prescriptions   No medications on file      Note:  This document was prepared using Dragon voice  recognition software and may include unintentional dictation errors.    [name], MD 07/31/16 [phone]

## 2016-07-31 NOTE — Discharge Instructions (Signed)
As we discussed, your workup today was reassuring.  It appears that you have a little bit of extra fluid which may be making her breathing more difficult so we gave you next dose of furosemide.  Fortunately it appears that you have no emergent medical condition at this time are safe to go home and follow up as recommended in this paperwork.  Please return immediately to the Emergency Department if you develop any new or worsening symptoms that concern you.

## 2016-07-31 NOTE — ED Notes (Addendum)
Pt reports she has "a lung disease" but does not know the name - she said that Dr Welton FlakesKhan said that if she got worse to come to the ER and that he would be paged - pt is in the process of being "worked in with a pulmonologist" - she states she is short of breath and wheezing - she reports this has gotten worse since Thursday - she states earlier today she had chest pain due to the shortness of breath - pt is on 2L of 02 via n/c at all times

## 2016-08-01 NOTE — H&P (Signed)
Pulmonary Critical Care  Initial Consult Note   Nicole Wyatt ZOX:[id number] DOB: June 25, 1966 DOA: 07/27/2016  Referring physician: Burnell Blanks, MD PCP: Derwood Kaplan, MD   Chief Complaint: for bronchoscopy  HPI: Nicole Wyatt is a 50 y.o. female presented with persistant infiltrate noted on radiological studies. Patient has had some SOB noted. She has no fevers noted. No chest pain noted. She has been hemodynamically stable for the porcedure   Review of Systems:  ROS performed and is unremarkable other than noted in HPI  Past Medical History:  Diagnosis Date  . Arthritis   . Bipolar 1 disorder (HCC)   . COPD (chronic obstructive pulmonary disease) (HCC)   . Depression   . Oxygen dependent   . Seizures (HCC) 05/2013   Past Surgical History:  Procedure Laterality Date  . ABDOMINAL HYSTERECTOMY  1999  . CARDIAC CATHETERIZATION Bilateral 05/29/2016   Procedure: Right/Left Heart Cath and Coronary Angiography;  Surgeon: Lamar Blinks, MD;  Location: [facility] INVASIVE CV LAB;  Service: Cardiovascular;  Laterality: Bilateral;  . COLONOSCOPY    . COLONOSCOPY N/A 07/19/2015   Procedure: COLONOSCOPY;  Surgeon: Earline Mayotte, MD;  Location: [facility] ENDOSCOPY;  Service: Endoscopy;  Laterality: N/A;  . FLEXIBLE BRONCHOSCOPY N/A 07/27/2016   Procedure: FLEXIBLE BRONCHOSCOPY;  Surgeon: Yevonne Pax, MD;  Location: [facility] ORS;  Service: Pulmonary;  Laterality: N/A;   Social History:  reports that she quit smoking about 2 months ago. Her smoking use included Cigarettes. She has a 14.00 pack-year smoking history. She has never used smokeless tobacco. She reports that she does not drink alcohol or use drugs.  Allergies  Allergen Reactions  . Bee Venom Anaphylaxis  . Penicillins Anaphylaxis and Nausea And Vomiting    Has patient had a PCN reaction causing immediate rash, facial/tongue/throat swelling, SOB or lightheadedness with hypotension: yes Has patient had a PCN reaction causing  severe rash involving mucus membranes or skin necrosis: {yes Has patient had a PCN reaction that required hospitalization yes Has patient had a PCN reaction occurring within the last 10 years: {yes If all of the above answers are "NO", then may proceed with Cephalosporin use.  Marland Kitchen Oxycodone Hives  . Percocet [Oxycodone-Acetaminophen] Hives    Family History  Problem Relation Age of Onset  . Hypertension Mother   . Hypertension Father     Prior to Admission medications   Medication Sig Start Date End Date Taking? Authorizing Provider  albuterol (PROVENTIL HFA;VENTOLIN HFA) 108 (90 Base) MCG/ACT inhaler Inhale 2 puffs into the lungs every 4 (four) hours as needed for wheezing or shortness of breath.    Yes Historical Provider, MD  EPINEPHrine (EPIPEN 2-PAK) 0.3 mg/0.3 mL IJ SOAJ injection Inject 0.3 mg into the muscle once. As needed for allergic reaction.   Yes Historical Provider, MD  Eslicarbazepine Acetate (APTIOM) 800 MG TABS Take 800 mg by mouth daily.   Yes Historical Provider, MD  furosemide (LASIX) 40 MG tablet Take 40 mg by mouth daily.   Yes Historical Provider, MD  isosorbide mononitrate (IMDUR) 30 MG 24 hr tablet Take 30 mg by mouth daily.   Yes Historical Provider, MD  Lacosamide (VIMPAT) 100 MG TABS Take 1 tablet (100 mg total) by mouth 2 (two) times daily. 09/25/15  Yes Emily Filbert, MD  levETIRAcetam (KEPPRA) 750 MG tablet Take 1,500 mg by mouth 2 (two) times daily.   Yes Historical Provider, MD  meloxicam (MOBIC) 15 MG tablet Take 15 mg by mouth daily.  Yes Historical Provider, MD  mometasone [facility](ASMANEX) 220 MCG/INH inhaler Inhale 2 puffs into the lungs daily.   Yes Historical Provider, MD  OXYGEN Inhale 2 L into the lungs daily.   Yes Historical Provider, MD  potassium chloride SA (K-DUR,KLOR-CON) 20 MEQ tablet Take 1 tablet by mouth daily. 11/24/14  Yes Historical Provider, MD  sertraline (ZOLOFT) 100 MG tablet Take 200 mg by mouth daily.   Yes Historical Provider, MD   venlafaxine XR (EFFEXOR-XR) 37.5 MG 24 hr capsule Take 37.5 mg by mouth daily with breakfast.   Yes Historical Provider, MD   Physical Exam: Vitals:   07/27/16 1030 07/27/16 1045 07/27/16 1100 07/27/16 1115  BP: (!) 96/57 99/69 112/63 118/68  Pulse: (!) 56 (!) 58 (!) 53 (!) 56  Resp: (!) 22 (!) 27 (!) 21 (!) 22  Temp:      SpO2: 99% 100% 100% 100%  Weight:      Height:        Wt Readings from Last 3 Encounters:  07/31/16 131.5 kg (290 lb)  07/27/16 136.1 kg (300 lb)  05/29/16 126.6 kg (279 lb)    General:  Appears calm and comfortable Eyes: PERRL, normal lids, irises & conjunctiva ENT: grossly normal hearing, lips & tongue Neck: no LAD, masses or thyromegaly Cardiovascular: RRR, no m/r/g. No LE edema. Respiratory: CTA bilaterally, no w/r/r. Normal respiratory effort. Abdomen: soft, nontender Skin: no rash or induration seen on limited exam Musculoskeletal: grossly normal tone BUE/BLE Psychiatric: grossly normal mood and affect Neurologic: grossly non-focal.          Labs on Admission:  Basic Metabolic Panel:  Recent Labs Lab 07/31/16 0305  NA 138  K 3.3*  CL 108  CO2 21*  GLUCOSE 106*  BUN 8  CREATININE 0.72  CALCIUM 9.3   Liver Function Tests: No results for input(s): AST, ALT, ALKPHOS, BILITOT, PROT, ALBUMIN in the last 168 hours. No results for input(s): LIPASE, AMYLASE in the last 168 hours. No results for input(s): AMMONIA in the last 168 hours. CBC:  Recent Labs Lab 07/31/16 0305  WBC 12.5*  HGB 13.5  HCT 38.3  MCV 85.9  PLT 327   Cardiac Enzymes:  Recent Labs Lab 07/31/16 0305  TROPONINI <0.03    BNP (last 3 results) No results for input(s): BNP in the last 8760 hours.  ProBNP (last 3 results) No results for input(s): PROBNP in the last 8760 hours.  CBG: No results for input(s): GLUCAP in the last 168 hours.  Radiological Exams on Admission: Dg Chest 2 View  Result Date: 07/31/2016 CLINICAL DATA:  Acute onset of shortness of  breath. Initial encounter. EXAM: CHEST  2 VIEW COMPARISON:  Chest radiograph performed 04/19/2016, and CT of the chest performed 07/23/2016 FINDINGS: The lungs are well-aerated. Mild vascular congestion is noted. There is no evidence of focal opacification, pleural effusion or pneumothorax. The heart is normal in size; the mediastinal contour is within normal limits. No acute osseous abnormalities are seen. IMPRESSION: Mild vascular congestion noted.  Lungs remain grossly clear. Electronically Signed   By: Roanna RaiderJeffery  Chang M.D.   On: 07/31/2016 03:37    EKG: Independently reviewed.  Assessment/Plan Active Problems:   * No active hospital problems. *   1. Pulmonary infiltrate -will proceed to bronchoscopy today      I have personally obtained a history, examined the patient, evaluated laboratory and imaging results, formulated the assessment and plan and placed orders.  The Patient requires high complexity decision making for  assessment and support.    Yevonne Pax, MD [facility] Pulmonary Critical Care Medicine Sleep Medicine

## 2016-08-18 LAB — CULTURE, FUNGUS WITHOUT SMEAR

## 2016-09-08 LAB — ACID FAST CULTURE WITH REFLEXED SENSITIVITIES (MYCOBACTERIA): Acid Fast Culture: NEGATIVE

## 2016-09-28 ENCOUNTER — Encounter: Payer: Self-pay | Admitting: *Deleted

## 2016-09-28 ENCOUNTER — Emergency Department: Payer: BLUE CROSS/BLUE SHIELD

## 2016-09-28 ENCOUNTER — Emergency Department
Admission: EM | Admit: 2016-09-28 | Discharge: 2016-09-28 | Disposition: A | Discharge status: Home / Self Care | Payer: BLUE CROSS/BLUE SHIELD | Attending: Student | Admitting: Student

## 2016-09-28 DIAGNOSIS — Z791 Long term (current) use of non-steroidal anti-inflammatories (NSAID): Secondary | ICD-10-CM | POA: Insufficient documentation

## 2016-09-28 DIAGNOSIS — Z79899 Other long term (current) drug therapy: Secondary | ICD-10-CM | POA: Diagnosis not present

## 2016-09-28 DIAGNOSIS — S0990XA Unspecified injury of head, initial encounter: Secondary | ICD-10-CM | POA: Insufficient documentation

## 2016-09-28 DIAGNOSIS — X58XXXA Exposure to other specified factors, initial encounter: Secondary | ICD-10-CM | POA: Diagnosis not present

## 2016-09-28 DIAGNOSIS — G40909 Epilepsy, unspecified, not intractable, without status epilepticus: Secondary | ICD-10-CM | POA: Insufficient documentation

## 2016-09-28 DIAGNOSIS — Z87891 Personal history of nicotine dependence: Secondary | ICD-10-CM | POA: Insufficient documentation

## 2016-09-28 DIAGNOSIS — Y929 Unspecified place or not applicable: Secondary | ICD-10-CM | POA: Insufficient documentation

## 2016-09-28 DIAGNOSIS — Y939 Activity, unspecified: Secondary | ICD-10-CM | POA: Diagnosis not present

## 2016-09-28 DIAGNOSIS — Y999 Unspecified external cause status: Secondary | ICD-10-CM | POA: Diagnosis not present

## 2016-09-28 DIAGNOSIS — J449 Chronic obstructive pulmonary disease, unspecified: Secondary | ICD-10-CM | POA: Insufficient documentation

## 2016-09-28 DIAGNOSIS — R569 Unspecified convulsions: Secondary | ICD-10-CM

## 2016-09-28 HISTORY — DX: Anxiety disorder, unspecified: F41.9

## 2016-09-28 LAB — URINALYSIS COMPLETE WITH MICROSCOPIC (ARMC ONLY)
BILIRUBIN URINE: NEGATIVE
GLUCOSE, UA: NEGATIVE mg/dL
Hgb urine dipstick: NEGATIVE
Ketones, ur: NEGATIVE mg/dL
LEUKOCYTES UA: NEGATIVE
Nitrite: NEGATIVE
PH: 6 (ref 5.0–8.0)
Protein, ur: NEGATIVE mg/dL
Specific Gravity, Urine: 1.006 (ref 1.005–1.030)

## 2016-09-28 LAB — CBC WITH DIFFERENTIAL/PLATELET
BASOS PCT: 1 %
Basophils Absolute: 0 10*3/uL (ref 0–0.1)
EOS ABS: 0 10*3/uL (ref 0–0.7)
Eosinophils Relative: 1 %
HEMATOCRIT: 43.1 % (ref 35.0–47.0)
HEMOGLOBIN: 14.3 g/dL (ref 12.0–16.0)
LYMPHS ABS: 2 10*3/uL (ref 1.0–3.6)
Lymphocytes Relative: 35 %
MCH: 28.9 pg (ref 26.0–34.0)
MCHC: 33 g/dL (ref 32.0–36.0)
MCV: 87.5 fL (ref 80.0–100.0)
MONOS PCT: 8 %
Monocytes Absolute: 0.5 10*3/uL (ref 0.2–0.9)
NEUTROS ABS: 3.2 10*3/uL (ref 1.4–6.5)
NEUTROS PCT: 55 %
Platelets: 304 10*3/uL (ref 150–440)
RBC: 4.93 MIL/uL (ref 3.80–5.20)
RDW: 17.8 % — ABNORMAL HIGH (ref 11.5–14.5)
WBC: 5.8 10*3/uL (ref 3.6–11.0)

## 2016-09-28 LAB — COMPREHENSIVE METABOLIC PANEL
ALBUMIN: 4.3 g/dL (ref 3.5–5.0)
ALK PHOS: 91 U/L (ref 38–126)
ALT: 30 U/L (ref 14–54)
AST: 31 U/L (ref 15–41)
Anion gap: 8 (ref 5–15)
BILIRUBIN TOTAL: 0.6 mg/dL (ref 0.3–1.2)
BUN: 13 mg/dL (ref 6–20)
CALCIUM: 9.5 mg/dL (ref 8.9–10.3)
CO2: 25 mmol/L (ref 22–32)
CREATININE: 0.83 mg/dL (ref 0.44–1.00)
Chloride: 103 mmol/L (ref 101–111)
GFR calc Af Amer: >60 mL/min (ref >60)
GFR calc non Af Amer: >60 mL/min (ref >60)
GLUCOSE: 107 mg/dL — AB (ref 65–99)
Potassium: 3.5 mmol/L (ref 3.5–5.1)
SODIUM: 136 mmol/L (ref 135–145)
Total Protein: 8.9 g/dL — ABNORMAL HIGH (ref 6.5–8.1)

## 2016-09-28 LAB — GLUCOSE, CAPILLARY: Glucose-Capillary: 104 mg/dL — ABNORMAL HIGH (ref 65–99)

## 2016-09-28 MED ORDER — HYDROCODONE-ACETAMINOPHEN 5-325 MG PO TABS
ORAL_TABLET | ORAL | Status: AC
  Administered 2016-09-28: 2 via ORAL
  Filled 2016-09-28: qty 2

## 2016-09-28 MED ORDER — SODIUM CHLORIDE 0.9 % IV SOLN
1000.0000 mg | Freq: Once | INTRAVENOUS | Status: AC
  Administered 2016-09-28: 1000 mg via INTRAVENOUS
  Filled 2016-09-28: qty 10

## 2016-09-28 MED ORDER — HYDROCODONE-ACETAMINOPHEN 5-325 MG PO TABS
2.0000 | ORAL_TABLET | Freq: Once | ORAL | Status: AC
  Administered 2016-09-28: 2 via ORAL

## 2016-09-28 MED ORDER — LORAZEPAM 2 MG/ML IJ SOLN
1.0000 mg | Freq: Once | INTRAMUSCULAR | Status: AC
  Administered 2016-09-28: 1 mg via INTRAVENOUS

## 2016-09-28 MED ORDER — SODIUM CHLORIDE 0.9 % IV BOLUS (SEPSIS)
500.0000 mL | Freq: Once | INTRAVENOUS | Status: AC
  Administered 2016-09-28: 500 mL via INTRAVENOUS

## 2016-09-28 MED ORDER — LORAZEPAM 2 MG/ML IJ SOLN
INTRAMUSCULAR | Status: AC
  Administered 2016-09-28: 1 mg via INTRAVENOUS
  Filled 2016-09-28: qty 1

## 2016-09-28 NOTE — ED Provider Notes (Signed)
[facility] Emergency Department Provider Note   ____________________________________________   First MD Initiated Contact with Patient 09/28/16 1531     (approximate)  I have reviewed the triage vital signs and the nursing notes.   HISTORY  Chief Complaint Seizures  Caveat-history of present illness and review of systems is limited due to the patient's postictal state. Information is obtained from EMS on their arrival.  HPI Nicole Wyatt is a 50 y.o. female with history of bipolar disorder, COPD with chronic 2 L home oxygen requirement, seizure disorder on Keppra who presents for evaluation of seizure. Per EMS, the patient was found on the porch at approximately 1:45 PM confused after a possible seizure. Family reported that she typically has confusion after seizure. Per EMS her vital signs are stable. No other history is available at this time.   Past Medical History:  Diagnosis Date  . Anxiety   . Arthritis   . Bipolar 1 disorder (HCC)   . COPD (chronic obstructive pulmonary disease) (HCC)   . Depression   . Oxygen dependent   . Seizures (HCC) 05/2013    Patient Active Problem List   Diagnosis Date Noted  . Unstable angina (HCC) 05/24/2016  . Skin lesion of right arm 03/01/2016  . Encounter for screening colonoscopy 06/10/2015  . Skin lesion of breast 06/10/2015    Past Surgical History:  Procedure Laterality Date  . ABDOMINAL HYSTERECTOMY  1999  . CARDIAC CATHETERIZATION Bilateral 05/29/2016   Procedure: Right/Left Heart Cath and Coronary Angiography;  Surgeon: Lamar BlinksBruce J Kowalski, MD;  Location: [facility] INVASIVE CV LAB;  Service: Cardiovascular;  Laterality: Bilateral;  . COLONOSCOPY    . COLONOSCOPY N/A 07/19/2015   Procedure: COLONOSCOPY;  Surgeon: Earline MayotteJeffrey W Byrnett, MD;  [facility];  Service: Endoscopy;  Laterality: N/A;  . FLEXIBLE BRONCHOSCOPY N/A 07/27/2016   Procedure: FLEXIBLE BRONCHOSCOPY;  Surgeon: Yevonne PaxSaadat A Khan, MD;   Location: [facility] ORS;  Service: Pulmonary;  Laterality: N/A;    Prior to Admission medications   Medication Sig Start Date End Date Taking? Authorizing Provider  albuterol (PROVENTIL HFA;VENTOLIN HFA) 108 (90 Base) MCG/ACT inhaler Inhale 2 puffs into the lungs every 4 (four) hours as needed for wheezing or shortness of breath.     Historical Provider, MD  EPINEPHrine (EPIPEN 2-PAK) 0.3 mg/0.3 mL IJ SOAJ injection Inject 0.3 mg into the muscle once. As needed for allergic reaction.    Historical Provider, MD  Eslicarbazepine Acetate (APTIOM) 800 MG TABS Take 800 mg by mouth daily.    Historical Provider, MD  furosemide (LASIX) 40 MG tablet Take 40 mg by mouth daily.    Historical Provider, MD  isosorbide mononitrate (IMDUR) 30 MG 24 hr tablet Take 30 mg by mouth daily.    Historical Provider, MD  Lacosamide (VIMPAT) 100 MG TABS Take 1 tablet (100 mg total) by mouth 2 (two) times daily. 09/25/15   Emily FilbertJonathan E Williams, MD  levETIRAcetam (KEPPRA) 750 MG tablet Take 1,500 mg by mouth 2 (two) times daily.    Historical Provider, MD  meloxicam (MOBIC) 15 MG tablet Take 15 mg by mouth daily.    Historical Provider, MD  mometasone [facility](ASMANEX) 220 MCG/INH inhaler Inhale 2 puffs into the lungs daily.    Historical Provider, MD  OXYGEN Inhale 2 L into the lungs daily.    Historical Provider, MD  potassium chloride SA (K-DUR,KLOR-CON) 20 MEQ tablet Take 1 tablet by mouth daily. 11/24/14   Historical Provider, MD  sertraline (ZOLOFT) 100  MG tablet Take 200 mg by mouth daily.    Historical Provider, MD  venlafaxine XR (EFFEXOR-XR) 37.5 MG 24 hr capsule Take 37.5 mg by mouth daily with breakfast.    Historical Provider, MD    Allergies Bee venom; Penicillins; Oxycodone; and Percocet [oxycodone-acetaminophen]  Family History  Problem Relation Age of Onset  . Hypertension Mother   . Hypertension Father     Social History Social History  Substance Use Topics  . Smoking status: Former Smoker    Packs/day:  1.00    Years: 14.00    Types: Cigarettes    Quit date: 05/07/2016  . Smokeless tobacco: Never Used  . Alcohol use No    Review of Systems  Caveat-history of present illness and review of systems is limited due to the patient's postictal state. Information is obtained from EMS on their arrival. ____________________________________________   PHYSICAL EXAM:  Temp 97.6 F orally  Vitals:   09/28/16 1525 09/28/16 1600 09/28/16 1931 09/28/16 1947  BP:  116/61 118/79   Pulse:  73 83   Resp:  (!) 23 20   Temp:    97.6 F (36.4 C)  TempSrc:    Oral  SpO2:  98% 100%   Weight: 290 lb (131.5 kg)       VITAL SIGNS: ED Triage Vitals [09/28/16 1525]  Enc Vitals Group     BP      Pulse      Resp      Temp      Temp src      SpO2      Weight 290 lb (131.5 kg)     Height      Head Circumference      Peak Flow      Pain Score      Pain Loc      Pain Edu?      Excl. in GC?     Constitutional: Awake and alert, appears confused, does not verbalize, follows commands to move extremities with much prompting. Eyes: Conjunctivae are normal. PERRL. EOMI. Head: Atraumatic. Nose: No congestion/rhinnorhea. Mouth/Throat: Mucous membranes are moist.  Oropharynx non-erythematous. Neck: No stridor.  No cervical spine tenderness to palpation. Cardiovascular: Normal rate, regular rhythm. Grossly normal heart sounds.  Good peripheral circulation. Respiratory: Normal respiratory effort.  No retractions. Lungs CTAB. Gastrointestinal: Soft and nontender. No distention.  No CVA tenderness. Genitourinary: deferred Musculoskeletal: No lower extremity tenderness nor edema.  No joint effusions. Neurologic: Does not speak/verbalize. Follows my commands to squeeze both hands and wiggle the toes but does not further cooperate with formal neurological testing. Skin:  Skin is warm, dry and intact. No rash noted. Psychiatric: Mood and affect are normal. Speech and behavior are  normal.  ____________________________________________   LABS (all labs ordered are listed, but only abnormal results are displayed)  Labs Reviewed  CBC WITH DIFFERENTIAL/PLATELET - Abnormal; Notable for the following:       Result Value   RDW 17.8 (*)    All other components within normal limits  COMPREHENSIVE METABOLIC PANEL - Abnormal; Notable for the following:    Glucose, Bld 107 (*)    Total Protein 8.9 (*)    All other components within normal limits  URINALYSIS COMPLETEWITH MICROSCOPIC ([facility] ONLY) - Abnormal; Notable for the following:    Color, Urine STRAW (*)    APPearance CLEAR (*)    Bacteria, UA RARE (*)    Squamous Epithelial / LPF 0-5 (*)    All other components  within normal limits  GLUCOSE, CAPILLARY - Abnormal; Notable for the following:    Glucose-Capillary 104 (*)    All other components within normal limits  CBG MONITORING, ED   ____________________________________________  EKG  ED ECG REPORT I, Gayla Doss, the attending physician, personally viewed and interpreted this ECG.   Date: 09/28/2016  EKG Time: 15:32  Rate: 66  Rhythm: normal sinus rhythm  Axis: normal  Intervals:right bundle branch block  ST&T Change: No acute ST elevation or acute ST depression. LVH. EKG generally unchanged from 07/31/16  ____________________________________________  RADIOLOGY  CT head and c-spine IMPRESSION:  1. No acute intracranial process.  2. No acute traumatic injury within cervical spine.       ____________________________________________   PROCEDURES  Procedure(s) performed: None  Procedures  Critical Care performed: No  ____________________________________________   INITIAL IMPRESSION / ASSESSMENT AND PLAN / ED COURSE  Pertinent labs & imaging results that were available during my care of the patient were reviewed by me and considered in my medical decision making (see chart for details).  Shanvi Etola Mull is a 50 y.o. female with  history of bipolar disorder, COPD with chronic 2 L home oxygen requirement, seizure disorder on Keppra who presents for evaluation of seizure. On exam, she appears confused but was able to follow my commands to move her extremities and appeared to do so equally. Her vital signs are stable and she is afebrile. Shortly after my initial exam she was noted to have approximately 10 second generalized tonic-clonic seizure in bed that resolved spontaneously and was accompanied by urinary incontinence. I ordered 1 mg of Ativan, will load with Keppra, obtain screening labs, EKG and observe in the emergency department. Reassess for disposition.  ----------------------------------------- 5:43 PM on 09/28/2016 ----------------------------------------- Patient is awake, alert, oriented, sitting up in bed conversing with family members at bedside, intact neuro exam, ambulatory, continue to monitor. She is complaining of headache and neck pain which began when she had a seizure and fell on the porch. Will Obtain CT head and C-spine, treat her pain.  ----------------------------------------- 7:46 PM on 09/28/2016 ----------------------------------------- Patient reports significant improvement of her head and neck pain at this time. CT head and C-spine are negative. CBC and CMP are unremarkable. Urinalysis is not consistent with infection. Patient remains awake, alert, oriented, observed several hours in the ER without recurrence of seizure activity. She reports that she has seizures every few months and no one knows what triggers them. she has been compliant with antiepileptic medications. She is suitable for discharge. We discussed return precautions and need for close PCP and Neurology follow-up and she is comfortable with the discharge plan. DC home.     Clinical Course     ____________________________________________   FINAL CLINICAL IMPRESSION(S) / ED DIAGNOSES  Final diagnoses:  Seizure (HCC)       NEW MEDICATIONS STARTED DURING THIS VISIT:  New Prescriptions   No medications on file     Note:  This document was prepared using Dragon voice recognition software and may include unintentional dictation errors.    Gayla Doss, MD 09/28/16 1949

## 2016-09-28 NOTE — ED Notes (Signed)
PT up to restroom independently and without difficulty.

## 2016-09-28 NOTE — ED Notes (Signed)
Pt had a 10 second episode of full body jerking. Pts head jerked upwards. Pt urinated on self twice. Pt remains confused after episode. MD came to bedside but pt was no longer having seizure like activity.

## 2016-09-28 NOTE — ED Notes (Signed)
Patient transported to CT 

## 2016-09-28 NOTE — ED Triage Notes (Signed)
Pt arrived to ED via EMS after family reports having found pt on the porch at 13:45. Pt has reported hx of seizures and approx 1 hour of post ictal confusion that family reports is normal. Pt presents to the ED confused and unable to answer staff questions. Pt has fearful expression and is continuously moving right hand. No sign of loss of bowels. No trauma to tongue. No obvious deformities or injuries from seizure.

## 2016-12-24 DIAGNOSIS — A770 Spotted fever due to Rickettsia rickettsii: Secondary | ICD-10-CM

## 2016-12-24 HISTORY — DX: Spotted fever due to Rickettsia rickettsii: A77.0

## 2017-01-29 ENCOUNTER — Emergency Department: Payer: BLUE CROSS/BLUE SHIELD

## 2017-01-29 ENCOUNTER — Emergency Department
Admission: EM | Admit: 2017-01-29 | Discharge: 2017-01-29 | Disposition: A | Discharge status: Home / Self Care | Payer: BLUE CROSS/BLUE SHIELD | Attending: Emergency Medicine | Admitting: Emergency Medicine

## 2017-01-29 DIAGNOSIS — Y9389 Activity, other specified: Secondary | ICD-10-CM | POA: Diagnosis not present

## 2017-01-29 DIAGNOSIS — Z79899 Other long term (current) drug therapy: Secondary | ICD-10-CM | POA: Insufficient documentation

## 2017-01-29 DIAGNOSIS — R55 Syncope and collapse: Secondary | ICD-10-CM | POA: Diagnosis not present

## 2017-01-29 DIAGNOSIS — R079 Chest pain, unspecified: Secondary | ICD-10-CM | POA: Diagnosis not present

## 2017-01-29 DIAGNOSIS — Y999 Unspecified external cause status: Secondary | ICD-10-CM | POA: Insufficient documentation

## 2017-01-29 DIAGNOSIS — Z791 Long term (current) use of non-steroidal anti-inflammatories (NSAID): Secondary | ICD-10-CM | POA: Diagnosis not present

## 2017-01-29 DIAGNOSIS — Y929 Unspecified place or not applicable: Secondary | ICD-10-CM | POA: Insufficient documentation

## 2017-01-29 DIAGNOSIS — M25512 Pain in left shoulder: Secondary | ICD-10-CM | POA: Diagnosis not present

## 2017-01-29 DIAGNOSIS — R51 Headache: Secondary | ICD-10-CM | POA: Insufficient documentation

## 2017-01-29 DIAGNOSIS — S4992XA Unspecified injury of left shoulder and upper arm, initial encounter: Secondary | ICD-10-CM | POA: Diagnosis present

## 2017-01-29 DIAGNOSIS — W1839XA Other fall on same level, initial encounter: Secondary | ICD-10-CM | POA: Diagnosis not present

## 2017-01-29 LAB — TROPONIN I

## 2017-01-29 LAB — CBC
HEMATOCRIT: 41.8 % (ref 35.0–47.0)
Hemoglobin: 14.3 g/dL (ref 12.0–16.0)
MCH: 30 pg (ref 26.0–34.0)
MCHC: 34.2 g/dL (ref 32.0–36.0)
MCV: 87.8 fL (ref 80.0–100.0)
Platelets: 284 10*3/uL (ref 150–440)
RBC: 4.76 MIL/uL (ref 3.80–5.20)
RDW: 17.9 % — AB (ref 11.5–14.5)
WBC: 6.2 10*3/uL (ref 3.6–11.0)

## 2017-01-29 LAB — BASIC METABOLIC PANEL
Anion gap: 8 (ref 5–15)
BUN: 13 mg/dL (ref 6–20)
CALCIUM: 9.1 mg/dL (ref 8.9–10.3)
CO2: 29 mmol/L (ref 22–32)
Chloride: 102 mmol/L (ref 101–111)
Creatinine, Ser: 0.8 mg/dL (ref 0.44–1.00)
GFR calc Af Amer: >60 mL/min (ref >60)
GFR calc non Af Amer: >60 mL/min (ref >60)
GLUCOSE: 98 mg/dL (ref 65–99)
Potassium: 3.6 mmol/L (ref 3.5–5.1)
Sodium: 139 mmol/L (ref 135–145)

## 2017-01-29 LAB — URINALYSIS, COMPLETE (UACMP) WITH MICROSCOPIC
BACTERIA UA: NONE SEEN
BILIRUBIN URINE: NEGATIVE
Glucose, UA: NEGATIVE mg/dL
Ketones, ur: NEGATIVE mg/dL
Leukocytes, UA: NEGATIVE
NITRITE: NEGATIVE
PH: 5 (ref 5.0–8.0)
Protein, ur: NEGATIVE mg/dL
RBC / HPF: NONE SEEN RBC/hpf (ref 0–5)
SPECIFIC GRAVITY, URINE: 1.005 (ref 1.005–1.030)

## 2017-01-29 LAB — POCT PREGNANCY, URINE: PREG TEST UR: NEGATIVE

## 2017-01-29 NOTE — ED Triage Notes (Signed)
Pt arrives to ED from home via [facility] from home d/t unwitnessed fall from syncopal episode. Pt with h/x of seizures, denies remembering any lightheadedness or dizziness prior to fall. Pt unsure of LOC, but states she came to with her "daughter standing over her"; EMS reports pt was incontinent of strong/foul smelling urine. Pt reported to EMS increasing weakness and confusion over the last 2 weeks, with intermittent headaches and chest pain. Pt presents with c/o pain to forehead (no swelling or bruising noted) and pain to the left shoulder. EMS reports CBG at 80, BP 130/80, and 99% O2 sats on RA.

## 2017-01-29 NOTE — ED Notes (Signed)
Pt given ginger ale.

## 2017-01-29 NOTE — ED Provider Notes (Signed)
[facility] Emergency Department Provider Note   ____________________________________________   First MD Initiated Contact with Patient 01/29/17 0209     (approximate)  I have reviewed the triage vital signs and the nursing notes.   HISTORY  Chief Complaint Fall and Loss of Consciousness    HPI Nicole Wyatt is a 51 y.o. female who comes into the hospital today with some confusion as well as some syncope. The patient reports that she's been confused on and off for the past few days. She was trying to go to the bathroom and up in the kitchen and she states that she passed out when she got there. The patient reports that currently she has some chest pain headache and pain in her left shoulder from the fall. The family member states that she was out for less than 5 minutes. The patient does have a history of seizures but she is unsure if she had a seizure tonight. The patient has had some decreased appetite and only had a banana today. The patient reports though that she has been drinking okay. She is here today for evaluation. The patient rates her pain a 9 out of 10 in intensity.   Past Medical History:  Diagnosis Date  . Anxiety   . Arthritis   . Bipolar 1 disorder (HCC)   . COPD (chronic obstructive pulmonary disease) (HCC)   . Depression   . Oxygen dependent   . Seizures (HCC) 05/2013    Patient Active Problem List   Diagnosis Date Noted  . Unstable angina (HCC) 05/24/2016  . Skin lesion of right arm 03/01/2016  . Encounter for screening colonoscopy 06/10/2015  . Skin lesion of breast 06/10/2015    Past Surgical History:  Procedure Laterality Date  . ABDOMINAL HYSTERECTOMY  1999  . CARDIAC CATHETERIZATION Bilateral 05/29/2016   Procedure: Right/Left Heart Cath and Coronary Angiography;  Surgeon: Lamar Blinks, MD;  Location: [facility] INVASIVE CV LAB;  Service: Cardiovascular;  Laterality: Bilateral;  . COLONOSCOPY    . COLONOSCOPY N/A  07/19/2015   Procedure: COLONOSCOPY;  Surgeon: Earline Mayotte, MD;  Location: [facility] ENDOSCOPY;  Service: Endoscopy;  Laterality: N/A;  . FLEXIBLE BRONCHOSCOPY N/A 07/27/2016   Procedure: FLEXIBLE BRONCHOSCOPY;  Surgeon: Yevonne Pax, MD;  Location: [facility] ORS;  Service: Pulmonary;  Laterality: N/A;    Prior to Admission medications   Medication Sig Start Date End Date Taking? Authorizing Provider  albuterol (PROVENTIL HFA;VENTOLIN HFA) 108 (90 Base) MCG/ACT inhaler Inhale 2 puffs into the lungs every 4 (four) hours as needed for wheezing or shortness of breath.     Historical Provider, MD  EPINEPHrine (EPIPEN 2-PAK) 0.3 mg/0.3 mL IJ SOAJ injection Inject 0.3 mg into the muscle once. As needed for allergic reaction.    Historical Provider, MD  Eslicarbazepine Acetate (APTIOM) 800 MG TABS Take 800 mg by mouth daily.    Historical Provider, MD  furosemide (LASIX) 40 MG tablet Take 40 mg by mouth daily.    Historical Provider, MD  isosorbide mononitrate (IMDUR) 30 MG 24 hr tablet Take 30 mg by mouth daily.    Historical Provider, MD  Lacosamide (VIMPAT) 100 MG TABS Take 1 tablet (100 mg total) by mouth 2 (two) times daily. 09/25/15   Emily Filbert, MD  levETIRAcetam (KEPPRA) 750 MG tablet Take 1,500 mg by mouth 2 (two) times daily.    Historical Provider, MD  meloxicam (MOBIC) 15 MG tablet Take 15 mg by mouth daily.  Historical Provider, MD  mometasone [facility]) 220 MCG/INH inhaler Inhale 2 puffs into the lungs daily.    Historical Provider, MD  OXYGEN Inhale 2 L into the lungs daily.    Historical Provider, MD  potassium chloride SA (K-DUR,KLOR-CON) 20 MEQ tablet Take 1 tablet by mouth daily. 11/24/14   Historical Provider, MD  sertraline (ZOLOFT) 100 MG tablet Take 200 mg by mouth daily.    Historical Provider, MD  venlafaxine XR (EFFEXOR-XR) 37.5 MG 24 hr capsule Take 37.5 mg by mouth daily with breakfast.    Historical Provider, MD    Allergies Bee venom; Penicillins; Oxycodone; and  Percocet [oxycodone-acetaminophen]  Family History  Problem Relation Age of Onset  . Hypertension Mother   . Hypertension Father     Social History Social History  Substance Use Topics  . Smoking status: Former Smoker    Packs/day: 1.00    Years: 14.00    Types: Cigarettes    Quit date: 05/07/2016  . Smokeless tobacco: Never Used  . Alcohol use No    Review of Systems Constitutional: No fever/chills Eyes: No visual changes. ENT: No sore throat. Cardiovascular:  chest pain. Respiratory: Denies shortness of breath. Gastrointestinal: No abdominal pain.  No nausea, no vomiting.  No diarrhea.  No constipation. Genitourinary: Negative for dysuria. Musculoskeletal: Left shoulder pain Skin: Negative for rash. Neurological: Syncope  10-point ROS otherwise negative.  ____________________________________________   PHYSICAL EXAM:  VITAL SIGNS: ED Triage Vitals  Enc Vitals Group     BP 01/29/17 0147 125/73     Pulse Rate 01/29/17 0147 62     Resp 01/29/17 0147 20     Temp 01/29/17 0147 99 F (37.2 C)     Temp Source 01/29/17 0147 Oral     SpO2 01/29/17 0147 98 %     Weight 01/29/17 0157 296 lb (134.3 kg)     Height 01/29/17 0157 5\' 7"  (1.702 m)     Head Circumference --      Peak Flow --      Pain Score 01/29/17 0157 9     Pain Loc --      Pain Edu? --      Excl. in GC? --     Constitutional: Alert and oriented. Well appearing and in Mild distress. Eyes: Conjunctivae are normal. PERRL. EOMI. Head: Atraumatic. Nose: No congestion/rhinnorhea. Mouth/Throat: Mucous membranes are moist.  Oropharynx non-erythematous. Cardiovascular: Normal rate, regular rhythm. Grossly normal heart sounds.  Good peripheral circulation. Respiratory: Normal respiratory effort.  No retractions. Lungs CTAB. Gastrointestinal: Soft and nontender. No distention.  Musculoskeletal: No lower extremity tenderness nor edema.   Neurologic:  Normal speech and language. Cranial nerves II through XII  are grossly intact the patient has some left upper extremity weakness which is old. Skin:  Skin is warm, dry and intact.  Psychiatric: Mood and affect are normal.   ____________________________________________   LABS (all labs ordered are listed, but only abnormal results are displayed)  Labs Reviewed  BASIC METABOLIC PANEL  CBC  URINALYSIS, COMPLETE (UACMP) WITH MICROSCOPIC  TROPONIN I  POC URINE PREG, ED   ____________________________________________  EKG  ED ECG REPORT I, Rebecka Apley, the attending physician, personally viewed and interpreted this ECG.   Date: 01/29/2017  EKG Time: 0153  Rate: 64  Rhythm: normal sinus rhythm  Axis: left axis deviation  Intervals:right bundle branch block and left anterior fascicular block  ST&T Change: Flipped T waves in leads 3, aVF, V1, V2, seen October 2017  ____________________________________________  RADIOLOGY  CT head and cervical spine CXR Left shoulder xray ____________________________________________   PROCEDURES  Procedure(s) performed: None  Procedures  Critical Care performed: No  ____________________________________________   INITIAL IMPRESSION / ASSESSMENT AND PLAN / ED COURSE  Pertinent labs & imaging results that were available during my care of the patient were reviewed by me and considered in my medical decision making (see chart for details).  This is a 51 year old female who comes into the hospital today with syncope. The patient reports that she's been feeling off balance over the last few days and she has not had much of an appetite. I will check some blood work as well as some imaging studies. I will reassess the patient once I received all of her results.  Clinical Course as of Jan 29 709  Tue Jan 29, 2017  [id number] No definite acute osseous abnormality DG Shoulder Left [AW]  0353 No radiographic evidence for acute cardiopulmonary abnormality. DG Chest 2 View [AW]  [phone] No acute  intracranial abnormalities.  Degenerative changes in the cervical spine. Mild chronic reversal of the usual cervical lordosis. No acute displaced fractures demonstrated.   CT Head Wo Contrast [AW]    Clinical Course User Index [AW] Rebecka ApleyAllison P Webster, MD   The patient's blood work is unremarkable. I did check 2 troponins and they were both negative. The patient does state that she is a falls risk and she does fall often. I explained with the patient that she possibly could've been a little dehydrated which is what caused some of her symptoms. She reports that she hadn't had much of an appetite. I encouraged her to still try to eat and drink if she doesn't have an appetite and she doesn't to follow-up with her primary care physician. Otherwise the patient has no further questions or concerns. She'll be discharged to home to follow-up with her primary care physician. The patient agrees with this plan as stated.  ____________________________________________   FINAL CLINICAL IMPRESSION(S) / ED DIAGNOSES  Final diagnoses:  None      NEW MEDICATIONS STARTED DURING THIS VISIT:  New Prescriptions   No medications on file     Note:  This document was prepared using Dragon voice recognition software and may include unintentional dictation errors.    Rebecka ApleyAllison P Webster, MD 01/29/17 [id number]

## 2017-01-29 NOTE — ED Notes (Signed)
Pt self-identifies as "High Fall Risk" d/t h/x of seizures; pt given 2 call buttons to press for assistance when needed, and placed in yellow socks with yellow High Fall Risk wristband.

## 2017-02-04 DIAGNOSIS — G40901 Epilepsy, unspecified, not intractable, with status epilepticus: Secondary | ICD-10-CM | POA: Diagnosis not present

## 2017-02-04 DIAGNOSIS — G4733 Obstructive sleep apnea (adult) (pediatric): Secondary | ICD-10-CM | POA: Diagnosis not present

## 2017-02-04 DIAGNOSIS — I119 Hypertensive heart disease without heart failure: Secondary | ICD-10-CM | POA: Diagnosis not present

## 2017-02-18 ENCOUNTER — Emergency Department
Admission: EM | Admit: 2017-02-18 | Discharge: 2017-02-18 | Disposition: A | Discharge status: Home / Self Care | Payer: BLUE CROSS/BLUE SHIELD | Attending: Emergency Medicine | Admitting: Emergency Medicine

## 2017-02-18 ENCOUNTER — Emergency Department: Payer: BLUE CROSS/BLUE SHIELD

## 2017-02-18 ENCOUNTER — Encounter: Payer: Self-pay | Admitting: Emergency Medicine

## 2017-02-18 DIAGNOSIS — R1909 Other intra-abdominal and pelvic swelling, mass and lump: Secondary | ICD-10-CM | POA: Diagnosis not present

## 2017-02-18 DIAGNOSIS — R1032 Left lower quadrant pain: Secondary | ICD-10-CM

## 2017-02-18 DIAGNOSIS — M79652 Pain in left thigh: Secondary | ICD-10-CM | POA: Diagnosis present

## 2017-02-18 DIAGNOSIS — Z87891 Personal history of nicotine dependence: Secondary | ICD-10-CM | POA: Diagnosis not present

## 2017-02-18 DIAGNOSIS — J449 Chronic obstructive pulmonary disease, unspecified: Secondary | ICD-10-CM | POA: Insufficient documentation

## 2017-02-18 LAB — BASIC METABOLIC PANEL
ANION GAP: 8 (ref 5–15)
BUN: 16 mg/dL (ref 6–20)
CHLORIDE: 107 mmol/L (ref 101–111)
CO2: 25 mmol/L (ref 22–32)
CREATININE: 0.92 mg/dL (ref 0.44–1.00)
Calcium: 9.2 mg/dL (ref 8.9–10.3)
GFR calc non Af Amer: >60 mL/min (ref >60)
Glucose, Bld: 90 mg/dL (ref 65–99)
POTASSIUM: 4.1 mmol/L (ref 3.5–5.1)
Sodium: 140 mmol/L (ref 135–145)

## 2017-02-18 LAB — CBC
HEMATOCRIT: 37.5 % (ref 35.0–47.0)
HEMOGLOBIN: 13 g/dL (ref 12.0–16.0)
MCH: 30.1 pg (ref 26.0–34.0)
MCHC: 34.8 g/dL (ref 32.0–36.0)
MCV: 86.3 fL (ref 80.0–100.0)
Platelets: 262 10*3/uL (ref 150–440)
RBC: 4.34 MIL/uL (ref 3.80–5.20)
RDW: 18.1 % — ABNORMAL HIGH (ref 11.5–14.5)
WBC: 6.2 10*3/uL (ref 3.6–11.0)

## 2017-02-18 MED ORDER — IBUPROFEN 800 MG PO TABS: 800.0000 mg | ORAL_TABLET | Freq: Three times a day (TID) | ORAL | 0 refills | Status: DC | PRN

## 2017-02-18 MED ORDER — KETOROLAC TROMETHAMINE 30 MG/ML IJ SOLN
30.0000 mg | Freq: Once | INTRAMUSCULAR | Status: AC
  Administered 2017-02-18: 30 mg via INTRAVENOUS
  Filled 2017-02-18: qty 1

## 2017-02-18 MED ORDER — FENTANYL CITRATE (PF) 100 MCG/2ML IJ SOLN
75.0000 ug | Freq: Once | INTRAMUSCULAR | Status: AC
Start: 2017-02-18 — End: 2017-02-18
  Administered 2017-02-18: 75 ug via INTRAVENOUS
  Filled 2017-02-18: qty 2

## 2017-02-18 MED ORDER — ONDANSETRON HCL 4 MG/2ML IJ SOLN
4.0000 mg | Freq: Once | INTRAMUSCULAR | Status: AC
  Administered 2017-02-18: 4 mg via INTRAVENOUS
  Filled 2017-02-18: qty 2

## 2017-02-18 NOTE — Discharge Instructions (Signed)
Please take Motrin, with food, every 8 hours for the next 3 days. Then take Motrin as needed for pain. In addition, please try ice or heat, which ever one feels better, for 10 minutes every 2 hours.  Return to the emergency department if you develop severe pain, numbness or tingling, or cold foot, fever, or any other symptoms concerning to you.

## 2017-02-18 NOTE — ED Provider Notes (Signed)
[facility] Emergency Department Provider Note  ____________________________________________  Time seen: Approximately 5:26 PM  I have reviewed the triage vital signs and the nursing notes.   HISTORY  Chief Complaint Groin Pain    HPI Nicole Wyatt is a 51 y.o. female with a history of morbid obesity, COPD on oxygen, arthritis, presenting with left upper thigh bulge and pain. The patient reports that for the past 2 months she has noticed a "knot" just below the left groin crease in the medial thigh, worse with standing or movement. Over the past couple of days, this bulge has become very painful and is associated with nausea but no vomiting. No diarrhea, dysuria, fever or chills.No numbness weakness or tingling in the lower extremity on the left.   Past Medical History:  Diagnosis Date  . Anxiety   . Arthritis   . Bipolar 1 disorder (HCC)   . COPD (chronic obstructive pulmonary disease) (HCC)   . Depression   . Oxygen dependent   . Seizures (HCC) 05/2013    Patient Active Problem List   Diagnosis Date Noted  . Unstable angina (HCC) 05/24/2016  . Skin lesion of right arm 03/01/2016  . Encounter for screening colonoscopy 06/10/2015  . Skin lesion of breast 06/10/2015    Past Surgical History:  Procedure Laterality Date  . ABDOMINAL HYSTERECTOMY  1999  . CARDIAC CATHETERIZATION Bilateral 05/29/2016   Procedure: Right/Left Heart Cath and Coronary Angiography;  Surgeon: Lamar Blinks, MD;  Location: [facility] INVASIVE CV LAB;  Service: Cardiovascular;  Laterality: Bilateral;  . COLONOSCOPY    . COLONOSCOPY N/A 07/19/2015   Procedure: COLONOSCOPY;  Surgeon: Earline Mayotte, MD;  Location: [facility] ENDOSCOPY;  Service: Endoscopy;  Laterality: N/A;  . FLEXIBLE BRONCHOSCOPY N/A 07/27/2016   Procedure: FLEXIBLE BRONCHOSCOPY;  Surgeon: Yevonne Pax, MD;  Location: [facility] ORS;  Service: Pulmonary;  Laterality: N/A;    Current Outpatient Rx  . Order #:  [id number] Class: Historical Med  . Order #: [id number] Class: Historical Med  . Order #: [id number] Class: Historical Med  . Order #: [id number] Class: Historical Med  . Order #: [id number] Class: Print  . Order #: [id number] Class: Historical Med  . Order #: [id number] Class: Print  . Order #: [id number] Class: Historical Med  . Order #: [id number] Class: Historical Med  . Order #: [id number] Class: Historical Med  . Order #: [id number] Class: Historical Med  . Order #: [id number] Class: Historical Med  . Order #: [id number] Class: Historical Med  . Order #: [id number] Class: Historical Med    Allergies Bee venom; Penicillins; Oxycodone; and Percocet [oxycodone-acetaminophen]  Family History  Problem Relation Age of Onset  . Hypertension Mother   . Hypertension Father     Social History Social History  Substance Use Topics  . Smoking status: Former Smoker    Packs/day: 1.00    Years: 14.00    Types: Cigarettes    Quit date: 05/07/2016  . Smokeless tobacco: Never Used  . Alcohol use No    Review of Systems Constitutional: No fever/chills. Eyes: No visual changes. ENT: No sore throat. No congestion or rhinorrhea. Cardiovascular: Denies chest pain. Denies palpitations. Respiratory: Denies shortness of breath.  No cough. Gastrointestinal: No abdominal pain.  Positive nausea, no vomiting.  No diarrhea.  No constipation. Genitourinary: Negative for dysuria. No urinary frequency. No hematuria. Positive pain in the left groin just below the groin crease, medial thigh. Musculoskeletal: Negative for back pain. Skin: Negative for rash. Neurological: Negative for headaches. No focal numbness, tingling or weakness.   10-point  ROS otherwise negative.  ____________________________________________   PHYSICAL EXAM:  VITAL SIGNS: ED Triage Vitals  Enc Vitals Group     BP 02/18/17 1608 121/60     Pulse Rate 02/18/17 1608 70     Resp 02/18/17 1608 18     Temp 02/18/17 1608 98.6 F (37 C)     Temp  Source 02/18/17 1608 Oral     SpO2 02/18/17 1608 98 %     Weight 02/18/17 1610 275 lb (124.7 kg)     Height 02/18/17 1610 5\' 6"  (1.676 m)     Head Circumference --      Peak Flow --      Pain Score 02/18/17 1614 10     Pain Loc --      Pain Edu? --      Excl. in GC? --     Constitutional: Alert and oriented. Chronically ill appearing but in no acute distress. Answers questions appropriately. Eyes: Conjunctivae are normal.  EOMI. No scleral icterus. Head: Atraumatic. Nose: No congestion/rhinnorhea. Mouth/Throat: Mucous membranes are moist.  Neck: No stridor.  Supple.  No meningismus. Cardiovascular: Normal rate, regular rhythm. No murmurs, rubs or gallops.  Respiratory: Normal respiratory effort.  No accessory muscle use or retractions. Lungs CTAB.  No wheezes, rales or ronchi. Gastrointestinal: Morbidly obese. Soft, nontender and nondistended.  No guarding or rebound.  No peritoneal signs. Genitourinary: The patient has reproducible tenderness to palpation approximately 1 inch below the left groin crease on the medial aspect of the left thigh without any palpable mass, lymphadenopathy, or skin changes including swelling, fluctuance, ecchymosis or crepitus. The left and right thighs are palpated and compared without any significant difference. She has normal left and right femoral pulses. Musculoskeletal: No LE edema. No ttp in the calves or palpable cords.  Negative Homan's sign. Normal DP and PT pulses in the left foot.  Neurologic:  A&Ox3.  Speech is clear.  Face and smile are symmetric.  EOMI.  Moves all extremities well. Normal sensation to light touch throughout the left lower extremity. Normal gait without ataxia. Skin:  Skin is warm, dry and intact. No rash noted. Psychiatric: Mood and affect are normal. Speech and behavior are normal.  Normal judgement. ____________________________________________   LABS (all labs ordered are listed, but only abnormal results are  displayed)  Labs Reviewed  CBC - Abnormal; Notable for the following:       Result Value   RDW 18.1 (*)    All other components within normal limits  BASIC METABOLIC PANEL   ____________________________________________  EKG  Not indicated ____________________________________________  RADIOLOGY  [place] Lt Lower Extrem Ltd Soft Tissue Non Vascular  Result Date: 02/18/2017 CLINICAL DATA:  Left thigh/ groin pain for 2 months. Left groin folds for 1 week. Obesity. EXAM: ULTRASOUND LEFT LOWER EXTREMITY LIMITED TECHNIQUE: Ultrasound examination of the lower extremity soft tissues was performed in the area of clinical concern. COMPARISON:  None. FINDINGS: The left upper medial thigh/groin was evaluated with ultrasound, corresponding to the area of clinical concern, showing only normal soft tissues throughout. No suspicious solid or cystic mass. No fluid collection or obvious soft tissue edema. No bowel herniation seen. IMPRESSION: No sonographic abnormality at the site of clinical concern. Electronically Signed   By: Bary RichardStan  Maynard M.D.   On: 02/18/2017 18:23    ____________________________________________   PROCEDURES  Procedure(s) performed: None  Procedures  Critical Care performed: No ____________________________________________   INITIAL IMPRESSION / ASSESSMENT AND PLAN / ED COURSE  Pertinent  labs & imaging results that were available during my care of the patient were reviewed by me and considered in my medical decision making (see chart for details).  51 y.o. female with morbid obesity presenting with left groin bulge and pain that is worse with standing and positional changes over the past 2 months, now causing nausea without vomiting. On my examination, I do not feel any palpable abnormalities other than pain. However, the patient is morbidly obese and this may limit my examination. We'll proceed with an ultrasound to evaluate for underlying mass, lymphadenopathy, fluctuance or  abscess, or much less likely hernia. Hernia is much less likely given that this pain is truly below the groin crease and in the leg rather than close to the abdomen. I do not feel crepitus, the patient is nontoxic, the patient is not diabetic; Fournier's gangrene is much less likely.  ----------------------------------------- 6:56 PM on 02/18/2017 -----------------------------------------  Patient continues to reassuring vital signs, and her laboratory studies show normal white blood cell count without hyperglycemia. The ultrasound does not show any causes for the patient's pain. At this time, the patient's pain has significantly improved with symptomatic treatment. There is no indication for further imaging at this time. Is possible this is a muscular skeletal pain, or nerve related, but I will plan to treat her symptomatically and with NSAIDs for pain relief and anti-inflammatory purposes, as well as heat and cryotherapy. She will follow up with her primary care physician in the next 3 days for further evaluation and treatment. Return precautions were discussed. ____________________________________________  FINAL CLINICAL IMPRESSION(S) / ED DIAGNOSES  Final diagnoses:  Left groin pain  Left groin mass         NEW MEDICATIONS STARTED DURING THIS VISIT:  New Prescriptions   IBUPROFEN (ADVIL,MOTRIN) 800 MG TABLET    Take 1 tablet (800 mg total) by mouth every 8 (eight) hours as needed for mild pain or moderate pain (with food).      Rockne Menghini, MD 02/18/17 [id number]

## 2017-02-18 NOTE — ED Triage Notes (Signed)
C/O pain to left groin x 2 month.  For the past week noticed "buldging" to left groin.

## 2017-04-04 DIAGNOSIS — Z1231 Encounter for screening mammogram for malignant neoplasm of breast: Secondary | ICD-10-CM | POA: Diagnosis not present

## 2017-04-09 DIAGNOSIS — Z90710 Acquired absence of both cervix and uterus: Secondary | ICD-10-CM | POA: Diagnosis not present

## 2017-04-09 DIAGNOSIS — R102 Pelvic and perineal pain: Secondary | ICD-10-CM | POA: Diagnosis not present

## 2017-04-09 DIAGNOSIS — N838 Other noninflammatory disorders of ovary, fallopian tube and broad ligament: Secondary | ICD-10-CM | POA: Diagnosis not present

## 2017-04-25 ENCOUNTER — Encounter: Payer: BLUE CROSS/BLUE SHIELD | Admitting: Obstetrics and Gynecology

## 2017-05-13 DIAGNOSIS — M25552 Pain in left hip: Secondary | ICD-10-CM | POA: Diagnosis not present

## 2017-05-13 DIAGNOSIS — M545 Low back pain: Secondary | ICD-10-CM | POA: Diagnosis not present

## 2017-05-22 DIAGNOSIS — M25552 Pain in left hip: Secondary | ICD-10-CM | POA: Diagnosis not present

## 2017-06-24 DIAGNOSIS — M25561 Pain in right knee: Secondary | ICD-10-CM | POA: Diagnosis not present

## 2017-06-24 DIAGNOSIS — I119 Hypertensive heart disease without heart failure: Secondary | ICD-10-CM | POA: Diagnosis not present

## 2017-06-24 DIAGNOSIS — J9610 Chronic respiratory failure, unspecified whether with hypoxia or hypercapnia: Secondary | ICD-10-CM | POA: Diagnosis not present

## 2017-06-24 DIAGNOSIS — G4733 Obstructive sleep apnea (adult) (pediatric): Secondary | ICD-10-CM | POA: Diagnosis not present

## 2017-07-22 DIAGNOSIS — F331 Major depressive disorder, recurrent, moderate: Secondary | ICD-10-CM | POA: Diagnosis not present

## 2017-08-19 ENCOUNTER — Encounter: Payer: Self-pay | Admitting: Emergency Medicine

## 2017-08-19 ENCOUNTER — Emergency Department: Payer: Medicare Other

## 2017-08-19 ENCOUNTER — Emergency Department
Admission: EM | Admit: 2017-08-19 | Discharge: 2017-08-19 | Disposition: A | Discharge status: Home / Self Care | Payer: Medicare Other | Attending: Emergency Medicine | Admitting: Emergency Medicine

## 2017-08-19 DIAGNOSIS — J449 Chronic obstructive pulmonary disease, unspecified: Secondary | ICD-10-CM | POA: Diagnosis not present

## 2017-08-19 DIAGNOSIS — N3090 Cystitis, unspecified without hematuria: Secondary | ICD-10-CM | POA: Diagnosis not present

## 2017-08-19 DIAGNOSIS — Z791 Long term (current) use of non-steroidal anti-inflammatories (NSAID): Secondary | ICD-10-CM | POA: Diagnosis not present

## 2017-08-19 DIAGNOSIS — Z87891 Personal history of nicotine dependence: Secondary | ICD-10-CM | POA: Diagnosis not present

## 2017-08-19 DIAGNOSIS — N3091 Cystitis, unspecified with hematuria: Secondary | ICD-10-CM | POA: Diagnosis not present

## 2017-08-19 DIAGNOSIS — Z79899 Other long term (current) drug therapy: Secondary | ICD-10-CM | POA: Insufficient documentation

## 2017-08-19 DIAGNOSIS — R300 Dysuria: Secondary | ICD-10-CM | POA: Diagnosis present

## 2017-08-19 DIAGNOSIS — N200 Calculus of kidney: Secondary | ICD-10-CM | POA: Diagnosis not present

## 2017-08-19 LAB — URINALYSIS, COMPLETE (UACMP) WITH MICROSCOPIC
Bacteria, UA: NONE SEEN
Bilirubin Urine: NEGATIVE
Glucose, UA: NEGATIVE mg/dL
Ketones, ur: NEGATIVE mg/dL
NITRITE: NEGATIVE
Protein, ur: 30 mg/dL — AB
Specific Gravity, Urine: 1.016 (ref 1.005–1.030)
pH: 6 (ref 5.0–8.0)

## 2017-08-19 MED ORDER — SULFAMETHOXAZOLE-TRIMETHOPRIM 800-160 MG PO TABS: 1.0000 | ORAL_TABLET | Freq: Two times a day (BID) | ORAL | 0 refills | Status: AC

## 2017-08-19 NOTE — ED Provider Notes (Signed)
[facility] Emergency Department Provider Note  ____________________________________________  Time seen: Approximately 3:55 PM  I have reviewed the triage vital signs and the nursing notes.   HISTORY  Chief Complaint Dysuria    HPI Nicole Wyatt is a 51 y.o. female presents to the emergency department with dysuria, increased urinary frequency and hematuria for approximately 2 weeks. Patient states that she had multiple renal stones removed approximately 8 years ago. She is not complaining of flank pain. She has had nausea and fatigue but no fever or vomiting. Patient states that she does not have urinary tract infections "often". She denies abdominal pain. Patient has been drinking cranberry juice.   Past Medical History:  Diagnosis Date  . Abnormal Pap smear of cervix   . Anxiety   . Arthritis   . Bipolar 1 disorder (HCC)   . COPD (chronic obstructive pulmonary disease) (HCC)   . Depression   . Oxygen dependent   . Seizures (HCC) 05/2013    Patient Active Problem List   Diagnosis Date Noted  . Unstable angina (HCC) 05/24/2016  . Skin lesion of right arm 03/01/2016  . Encounter for screening colonoscopy 06/10/2015  . Skin lesion of breast 06/10/2015    Past Surgical History:  Procedure Laterality Date  . ABDOMINAL HYSTERECTOMY  1999  . BLADDER SURGERY    . CARDIAC CATHETERIZATION Bilateral 05/29/2016   Procedure: Right/Left Heart Cath and Coronary Angiography;  Surgeon: Lamar Blinks, MD;  Location: [facility] INVASIVE CV LAB;  Service: Cardiovascular;  Laterality: Bilateral;  . COLONOSCOPY    . COLONOSCOPY N/A 07/19/2015   Procedure: COLONOSCOPY;  Surgeon: Earline Mayotte, MD;  Location: [facility] ENDOSCOPY;  Service: Endoscopy;  Laterality: N/A;  . FLEXIBLE BRONCHOSCOPY N/A 07/27/2016   Procedure: FLEXIBLE BRONCHOSCOPY;  Surgeon: Yevonne Pax, MD;  Location: [facility] ORS;  Service: Pulmonary;  Laterality: N/A;  . FOOT SURGERY    . TUBAL LIGATION       Prior to Admission medications   Medication Sig Start Date End Date Taking? Authorizing Provider  albuterol (PROVENTIL HFA;VENTOLIN HFA) 108 (90 Base) MCG/ACT inhaler Inhale 2 puffs into the lungs every 4 (four) hours as needed for wheezing or shortness of breath.     [provider]  EPINEPHrine (EPIPEN 2-PAK) 0.3 mg/0.3 mL IJ SOAJ injection Inject 0.3 mg into the muscle once. As needed for allergic reaction.    [provider]  Eslicarbazepine Acetate (APTIOM) 800 MG TABS Take 800 mg by mouth daily.    [provider]  furosemide (LASIX) 40 MG tablet Take 40 mg by mouth daily.    [provider]  ibuprofen (ADVIL,MOTRIN) 800 MG tablet Take 1 tablet (800 mg total) by mouth every 8 (eight) hours as needed for mild pain or moderate pain (with food). 02/18/17   Rockne Menghini, MD  isosorbide mononitrate (IMDUR) 30 MG 24 hr tablet Take 30 mg by mouth daily.    [provider]  Lacosamide (VIMPAT) 100 MG TABS Take 1 tablet (100 mg total) by mouth 2 (two) times daily. 09/25/15   Emily Filbert, MD  levETIRAcetam (KEPPRA) 750 MG tablet Take 1,500 mg by mouth 2 (two) times daily.    [provider]  meloxicam (MOBIC) 15 MG tablet Take 15 mg by mouth daily.    [provider]  mometasone [facility]) 220 MCG/INH inhaler Inhale 2 puffs into the lungs daily.    [provider]  OXYGEN Inhale 2 L into the lungs daily.  [provider]  potassium chloride SA (K-DUR,KLOR-CON) 20 MEQ tablet Take 1 tablet by mouth daily. 11/24/14   [provider]  sertraline (ZOLOFT) 100 MG tablet Take 200 mg by mouth daily.    [provider]  sulfamethoxazole-trimethoprim (BACTRIM DS,SEPTRA DS) 800-160 MG tablet Take 1 tablet by mouth 2 (two) times daily. 08/19/17 08/26/17  Orvil Feil, PA-C  venlafaxine XR (EFFEXOR-XR) 37.5 MG 24 hr capsule Take 37.5 mg by mouth daily with breakfast.    [provider]     Allergies Bee venom; Penicillins; and Percocet [oxycodone-acetaminophen]  Family History  Problem Relation Age of Onset  . Hypertension Mother   . Arthritis Mother   . Hypertension Father   . Arthritis Father   . Prostate cancer Father   . Ovarian cancer Maternal Grandmother     Social History Social History  Substance Use Topics  . Smoking status: Former Smoker    Packs/day: 1.00    Years: 14.00    Types: Cigarettes    Quit date: 05/07/2016  . Smokeless tobacco: Never Used  . Alcohol use No     Review of Systems  Constitutional: No fever/chills Eyes: No visual changes. No discharge ENT: No upper respiratory complaints. Cardiovascular: no chest pain. Respiratory: no cough. No SOB. Gastrointestinal: Patient has nausea Genitourinary: Patient has dysuria and hematuria. Skin: Negative for rash, abrasions, lacerations, ecchymosis.    ____________________________________________   PHYSICAL EXAM:  VITAL SIGNS: ED Triage Vitals  Enc Vitals Group     BP 08/19/17 1401 127/88     Pulse Rate 08/19/17 1401 82     Resp 08/19/17 1401 20     Temp 08/19/17 1401 98.9 F (37.2 C)     Temp Source 08/19/17 1401 Oral     SpO2 08/19/17 1401 100 %     Weight 08/19/17 1402 264 lb (119.7 kg)     Height 08/19/17 1402 5\' 6"  (1.676 m)     Head Circumference --      Peak Flow --      Pain Score 08/19/17 1401 10     Pain Loc --      Pain Edu? --      Excl. in GC? --     Constitutional: Alert and oriented. Well appearing and in no acute distress. Eyes: Conjunctivae are normal. PERRL. EOMI. Head: Atraumatic. Cardiovascular: Normal rate, regular rhythm. Normal S1 and S2.  Good peripheral circulation. Respiratory: Normal respiratory effort without tachypnea or retractions. Lungs CTAB. Good air entry to the bases with no decreased or absent breath sounds. Gastrointestinal: Bowel sounds 4 quadrants. She has suprapubic tenderness to palpation. No guarding or rigidity. No palpable  masses. No distention. No CVA tenderness. Skin:  Skin is warm, dry and intact. No rash noted. ____________________________________________   LABS (all labs ordered are listed, but only abnormal results are displayed)  Labs Reviewed  URINALYSIS, COMPLETE (UACMP) WITH MICROSCOPIC - Abnormal; Notable for the following:       Result Value   Color, Urine YELLOW (*)    APPearance CLOUDY (*)    Hgb urine dipstick MODERATE (*)    Protein, ur 30 (*)    Leukocytes, UA LARGE (*)    Squamous Epithelial / LPF 0-5 (*)    All other components within normal limits   ____________________________________________  EKG   ____________________________________________  RADIOLOGY   Ct Renal Stone Study  Result Date: 08/19/2017 CLINICAL DATA:  Two week history of pain for urination frequency. History of prior renal  stones. EXAM: CT ABDOMEN AND PELVIS WITHOUT CONTRAST TECHNIQUE: Multidetector CT imaging of the abdomen and pelvis was performed following the standard protocol without IV contrast. COMPARISON:  CT abdomen pelvis dated September 29, 2011. FINDINGS: Lower chest: No acute abnormality. Hepatobiliary: Hepatic steatosis. No focal liver abnormality. The gallbladder is unremarkable. No biliary dilatation. Pancreas: Unremarkable. No pancreatic ductal dilatation or surrounding inflammatory changes. Spleen: Mild splenomegaly.  No focal abnormality. Adrenals/Urinary Tract: Stable 1.8 cm right adrenal adenoma. Left adrenal gland is unremarkable. Punctate nonobstructive left renal calculus. Multiple punctate calcifications in the urine the right ureter are unchanged, and are consistent with phleboliths. No hydronephrosis. The bladder is unremarkable for degree of distention. Stomach/Bowel: Stomach is within normal limits. Appendix appears normal. No evidence of bowel wall thickening, distention, or inflammatory changes. Vascular/Lymphatic: No significant vascular findings are present. No enlarged abdominal or pelvic  lymph nodes. Multiple prominent subcentimeter retroperitoneal lymph nodes are not enlarged by CT size criteria and are unchanged when compared to prior study. Reproductive: Status post hysterectomy. Fullness of the left adnexa. The right adnexa is unremarkable. Other: Small fat containing umbilical hernia.  No free fluid. Musculoskeletal: No acute or significant osseous findings. Severe left and moderate right hip degenerative changes. IMPRESSION: 1. Nonobstructive left renal calculus. No hydronephrosis. No definite ureteral calculi. Multiple punctate calcifications adjacent to the course of the right ureter are unchanged when compared to prior study, and likely represent phleboliths. 2. Increased fullness of the left adnexa without discrete mass. Recommend nonemergent pelvic ultrasound for further evaluation. Electronically Signed   By: Obie Dredge M.D.   On: 08/19/2017 16:34    ____________________________________________    PROCEDURES  Procedure(s) performed:    Procedures    Medications - No data to display   ____________________________________________   INITIAL IMPRESSION / ASSESSMENT AND PLAN / ED COURSE  Pertinent labs & imaging results that were available during my care of the patient were reviewed by me and considered in my medical decision making (see chart for details).  Review of the [place] CSRS was performed in accordance of the [facility] prior to dispensing any controlled drugs.     Assessment and Plan:  Acute cystitis Patient presents to the emergency department with dysuria, increased urinary frequency and hematuria for approximately 2 weeks. CT renal stone study did not indicate findings consistent with obstructive renal stones. Patient was discharged with Bactrim. She was advised to follow up with primary care as needed. Vital signs are reassuring prior to discharge. All patient questions were answered. ____________________________________________  FINAL CLINICAL  IMPRESSION(S) / ED DIAGNOSES  Final diagnoses:  Cystitis      NEW MEDICATIONS STARTED DURING THIS VISIT:  New Prescriptions   SULFAMETHOXAZOLE-TRIMETHOPRIM (BACTRIM DS,SEPTRA DS) 800-160 MG TABLET    Take 1 tablet by mouth 2 (two) times daily.        This chart was dictated using voice recognition software/Dragon. Despite best efforts to proofread, errors can occur which can change the meaning. Any change was purely unintentional.    Gasper Lloyd 08/19/17 1655    Sharman Cheek, MD 08/20/17 2216

## 2017-08-19 NOTE — ED Notes (Signed)
Pt returned from CT via stretcher.

## 2017-08-19 NOTE — ED Notes (Signed)
Pt reports 2 weeks of painful urination and frequency. States blood in urine. Denies fever. Been staying hydrated with water. Tried cranberry juice. Hx of 8 kidney stones and UTI.

## 2017-08-19 NOTE — ED Triage Notes (Signed)
Frequency and burning with urination x 2 weeks, no flank pain.

## 2017-08-19 NOTE — ED Notes (Signed)
Pt wearing home oxygen, talking on cell phone. No distress noted.

## 2017-10-07 ENCOUNTER — Emergency Department: Payer: Medicare Other

## 2017-10-07 ENCOUNTER — Encounter: Payer: Self-pay | Admitting: *Deleted

## 2017-10-07 ENCOUNTER — Emergency Department
Admission: EM | Admit: 2017-10-07 | Discharge: 2017-10-07 | Disposition: A | Discharge status: Home / Self Care | Payer: Medicare Other | Attending: Emergency Medicine | Admitting: Emergency Medicine

## 2017-10-07 DIAGNOSIS — Z87891 Personal history of nicotine dependence: Secondary | ICD-10-CM | POA: Insufficient documentation

## 2017-10-07 DIAGNOSIS — M545 Low back pain: Secondary | ICD-10-CM | POA: Diagnosis not present

## 2017-10-07 DIAGNOSIS — M546 Pain in thoracic spine: Secondary | ICD-10-CM | POA: Diagnosis not present

## 2017-10-07 DIAGNOSIS — S0990XA Unspecified injury of head, initial encounter: Secondary | ICD-10-CM | POA: Diagnosis not present

## 2017-10-07 DIAGNOSIS — M542 Cervicalgia: Secondary | ICD-10-CM | POA: Diagnosis not present

## 2017-10-07 DIAGNOSIS — S3992XA Unspecified injury of lower back, initial encounter: Secondary | ICD-10-CM | POA: Diagnosis not present

## 2017-10-07 DIAGNOSIS — J449 Chronic obstructive pulmonary disease, unspecified: Secondary | ICD-10-CM | POA: Diagnosis not present

## 2017-10-07 DIAGNOSIS — R569 Unspecified convulsions: Secondary | ICD-10-CM

## 2017-10-07 DIAGNOSIS — R52 Pain, unspecified: Secondary | ICD-10-CM | POA: Diagnosis not present

## 2017-10-07 DIAGNOSIS — S199XXA Unspecified injury of neck, initial encounter: Secondary | ICD-10-CM | POA: Diagnosis not present

## 2017-10-07 DIAGNOSIS — M25512 Pain in left shoulder: Secondary | ICD-10-CM | POA: Diagnosis not present

## 2017-10-07 LAB — CBC WITH DIFFERENTIAL/PLATELET
BASOS ABS: 0 10*3/uL (ref 0–0.1)
BASOS PCT: 1 %
Eosinophils Absolute: 0.1 10*3/uL (ref 0–0.7)
Eosinophils Relative: 1 %
HEMATOCRIT: 42.4 % (ref 35.0–47.0)
HEMOGLOBIN: 14.4 g/dL (ref 12.0–16.0)
Lymphocytes Relative: 39 %
Lymphs Abs: 2.6 10*3/uL (ref 1.0–3.6)
MCH: 30.5 pg (ref 26.0–34.0)
MCHC: 34 g/dL (ref 32.0–36.0)
MCV: 89.8 fL (ref 80.0–100.0)
Monocytes Absolute: 0.6 10*3/uL (ref 0.2–0.9)
Monocytes Relative: 10 %
NEUTROS ABS: 3.3 10*3/uL (ref 1.4–6.5)
NEUTROS PCT: 49 %
Platelets: 294 10*3/uL (ref 150–440)
RBC: 4.71 MIL/uL (ref 3.80–5.20)
RDW: 17.2 % — ABNORMAL HIGH (ref 11.5–14.5)
WBC: 6.6 10*3/uL (ref 3.6–11.0)

## 2017-10-07 LAB — COMPREHENSIVE METABOLIC PANEL
ALBUMIN: 4.3 g/dL (ref 3.5–5.0)
ALK PHOS: 77 U/L (ref 38–126)
ALT: 24 U/L (ref 14–54)
AST: 29 U/L (ref 15–41)
Anion gap: 12 (ref 5–15)
BILIRUBIN TOTAL: 1.1 mg/dL (ref 0.3–1.2)
BUN: 17 mg/dL (ref 6–20)
CALCIUM: 9.2 mg/dL (ref 8.9–10.3)
CO2: 22 mmol/L (ref 22–32)
CREATININE: 0.79 mg/dL (ref 0.44–1.00)
Chloride: 106 mmol/L (ref 101–111)
GFR calc non Af Amer: >60 mL/min (ref >60)
GLUCOSE: 96 mg/dL (ref 65–99)
Potassium: 3.8 mmol/L (ref 3.5–5.1)
SODIUM: 140 mmol/L (ref 135–145)
TOTAL PROTEIN: 8.3 g/dL — AB (ref 6.5–8.1)

## 2017-10-07 LAB — URINALYSIS, COMPLETE (UACMP) WITH MICROSCOPIC
Bilirubin Urine: NEGATIVE
Glucose, UA: NEGATIVE mg/dL
Ketones, ur: 20 mg/dL — AB
Nitrite: POSITIVE — AB
PH: 6 (ref 5.0–8.0)
Protein, ur: NEGATIVE mg/dL
SPECIFIC GRAVITY, URINE: 1.015 (ref 1.005–1.030)

## 2017-10-07 LAB — GLUCOSE, CAPILLARY: GLUCOSE-CAPILLARY: 70 mg/dL (ref 65–99)

## 2017-10-07 MED ORDER — IBUPROFEN 800 MG PO TABS: 800.0000 mg | ORAL_TABLET | Freq: Three times a day (TID) | ORAL | 0 refills | Status: DC | PRN

## 2017-10-07 MED ORDER — DIAZEPAM 5 MG PO TABS: 5.0000 mg | ORAL_TABLET | Freq: Three times a day (TID) | ORAL | 0 refills | Status: DC | PRN

## 2017-10-07 MED ORDER — SODIUM CHLORIDE 0.9 % IV SOLN
1000.0000 mg | Freq: Once | INTRAVENOUS | Status: AC
  Administered 2017-10-07: 1000 mg via INTRAVENOUS
  Filled 2017-10-07 (×2): qty 10

## 2017-10-07 NOTE — ED Notes (Signed)
EDP at bedside  

## 2017-10-07 NOTE — ED Triage Notes (Signed)
Pt arrives via EMS from home, pt had an unwitnessed seizure this afternoon, family states they heard pt hit the floor, family reports pt did hit her head, states she did not take her seizure medication today due to lack of appetite, hx of seizures, pt on2L [place] chronically, awake and alert upon arrival

## 2017-10-07 NOTE — ED Provider Notes (Signed)
[facility] Emergency Department Provider Note       Time seen: ----------------------------------------- 5:02 PM on 10/07/2017 -----------------------------------------     I have reviewed the triage vital signs and the nursing notes.   HISTORY   Chief Complaint Seizures; Back Pain; and Neck Pain    HPI Nicole Wyatt is a 51 y.o. female with a history of seizures who presents to the ED for an unwitnessed seizure that occurred at home. Patient arrives by EMS from home after she reportedly had a seizure. No one is able to describe the seizure like event. Her family reported that they heard her hit the floor, that she possibly hit her head as well. She has not taken her seizure medications today due to lack of appetite. She states she has not felt well recently but denies any specific symptoms. She denies being under significant stress.  Past Medical History:  Diagnosis Date  . Abnormal Pap smear of cervix   . Anxiety   . Arthritis   . Bipolar 1 disorder (HCC)   . COPD (chronic obstructive pulmonary disease) (HCC)   . Depression   . Oxygen dependent   . Seizures (HCC) 05/2013    Patient Active Problem List   Diagnosis Date Noted  . Unstable angina (HCC) 05/24/2016  . Skin lesion of right arm 03/01/2016  . Encounter for screening colonoscopy 06/10/2015  . Skin lesion of breast 06/10/2015    Past Surgical History:  Procedure Laterality Date  . ABDOMINAL HYSTERECTOMY  1999  . BLADDER SURGERY    . CARDIAC CATHETERIZATION Bilateral 05/29/2016   Procedure: Right/Left Heart Cath and Coronary Angiography;  Surgeon: Lamar Blinks, MD;  Location: [facility] INVASIVE CV LAB;  Service: Cardiovascular;  Laterality: Bilateral;  . COLONOSCOPY    . COLONOSCOPY N/A 07/19/2015   Procedure: COLONOSCOPY;  Surgeon: Earline Mayotte, MD;  Location: [facility] ENDOSCOPY;  Service: Endoscopy;  Laterality: N/A;  . FLEXIBLE BRONCHOSCOPY N/A 07/27/2016   Procedure: FLEXIBLE  BRONCHOSCOPY;  Surgeon: Yevonne Pax, MD;  Location: [facility] ORS;  Service: Pulmonary;  Laterality: N/A;  . FOOT SURGERY    . TUBAL LIGATION      Allergies Bee venom; Penicillins; and Percocet [oxycodone-acetaminophen]  Social History Social History  Substance Use Topics  . Smoking status: Former Smoker    Packs/day: 1.00    Years: 14.00    Types: Cigarettes    Quit date: 05/07/2016  . Smokeless tobacco: Never Used  . Alcohol use No    Review of Systems Constitutional: Negative for fever.positive for poor appetite Eyes: Negative for vision changes ENT:  Negative for congestion, sore throat Cardiovascular: Negative for chest pain. Respiratory: Negative for shortness of breath. Gastrointestinal: Negative for abdominal pain, vomiting and diarrhea. Genitourinary: Negative for dysuria. Musculoskeletal: Negative for back pain. Skin: Negative for rash. Neurological: Negative for headaches, focal weakness or numbness.  All systems negative/normal/unremarkable except as stated in the HPI  ____________________________________________   PHYSICAL EXAM:  VITAL SIGNS: ED Triage Vitals [10/07/17 1702]  Enc Vitals Group     BP 113/64     Pulse Rate 69     Resp 18     Temp 97.8 F (36.6 C)     Temp Source Oral     SpO2 100 %     Weight (!) 310 lb (140.6 kg)     Height  (1.676 m)     Head Circumference      Peak Flow      Pain  Score 10     Pain Loc      Pain Edu?      Excl. in GC?    Constitutional: Alert and oriented. Well appearing and in no distress. Eyes: Conjunctivae are normal. Normal extraocular movements. ENT   Head: Normocephalic and atraumatic.   Nose: No congestion/rhinnorhea.   Mouth/Throat: Mucous membranes are moist.   Neck: No stridor. Cardiovascular: Normal rate, regular rhythm. No murmurs, rubs, or gallops. Respiratory: Normal respiratory effort without tachypnea nor retractions. Breath sounds are clear and equal bilaterally. No  wheezes/rales/rhonchi. Gastrointestinal: Soft and nontender. Normal bowel sounds Musculoskeletal: Nontender with normal range of motion in extremities. No lower extremity tenderness nor edema.diffuse, nonfocal neck and back tenderness. Neurologic:  Normal speech and language. No gross focal neurologic deficits are appreciated.  Skin:  Skin is warm, dry and intact. No rash noted. Psychiatric: Mood and affect are normal. Speech and behavior are normal.  ____________________________________________  EKG: Interpreted by me.sinus rhythm rate of 60 bpm, prolonged PR interval, right bundle branch block, LVH, left anterior vesicular block  ____________________________________________  ED COURSE:  Pertinent labs & imaging results that were available during my care of the patient were reviewed by me and considered in my medical decision making (see chart for details). Patient presents for weakness with a seizure today, we will assess with labs and imaging as indicated.   Procedures ____________________________________________   LABS (pertinent positives/negatives)  Labs Reviewed  CBC WITH DIFFERENTIAL/PLATELET - Abnormal; Notable for the following:       Result Value   RDW 17.2 (*)    All other components within normal limits  COMPREHENSIVE METABOLIC PANEL - Abnormal; Notable for the following:    Total Protein 8.3 (*)    All other components within normal limits  GLUCOSE, CAPILLARY  URINALYSIS, COMPLETE (UACMP) WITH MICROSCOPIC  CBG MONITORING, ED    RADIOLOGY Images were viewed by me  CT head, cervical spine, thoracic spine, lumbar spine x-rays IMPRESSION: No CT evidence for acute intracranial abnormality. IMPRESSION: 1. No acute fracture or dislocation. 2. Stable mild loss of L5-S1 intervertebral disc space height and lower lumbar facet arthrosis. IMPRESSION: 1. No acute fracture or dislocation. 2. Mild cervical spondylosis greatest at C5-C7 levels is stable given differences  in technique. ____________________________________________  DIFFERENTIAL DIAGNOSIS   seizure, muscle strain, vertebral fracture, subdural, intracranial hemorrhage, medication noncompliance   FINAL ASSESSMENT AND PLAN  seizure   Plan: Patient had presented for seizure-like event today. Patients labs were reassuring. Patients imaging was also reassuring. She likely did have a seizure today from noncompliance. she does admit to not taking her Keppra today. We have reloaded her with IV Keppra and she is stable for outpatient follow-up as long she takes her medications.   Emily Filbert, MD   Note: This note was generated in part or whole with voice recognition software. Voice recognition is usually quite accurate but there are transcription errors that can and very often do occur. I apologize for any typographical errors that were not detected and corrected.     Emily Filbert, MD 10/07/17 [id number]

## 2017-10-07 NOTE — ED Notes (Signed)
Pt given orange juice per Dr. Mayford Knife

## 2017-10-07 NOTE — ED Notes (Signed)
Patient transported to X-ray 

## 2017-10-07 NOTE — ED Notes (Signed)
Pt resting in bed, family at bedside.

## 2017-10-07 NOTE — ED Notes (Signed)
c-collar removed by Dr. Williams. 

## 2017-10-10 DIAGNOSIS — F17210 Nicotine dependence, cigarettes, uncomplicated: Secondary | ICD-10-CM | POA: Diagnosis not present

## 2017-10-10 DIAGNOSIS — L819 Disorder of pigmentation, unspecified: Secondary | ICD-10-CM | POA: Diagnosis not present

## 2017-10-10 DIAGNOSIS — J449 Chronic obstructive pulmonary disease, unspecified: Secondary | ICD-10-CM | POA: Diagnosis not present

## 2017-10-10 DIAGNOSIS — G4733 Obstructive sleep apnea (adult) (pediatric): Secondary | ICD-10-CM | POA: Diagnosis not present

## 2017-10-28 DIAGNOSIS — G4733 Obstructive sleep apnea (adult) (pediatric): Secondary | ICD-10-CM | POA: Diagnosis not present

## 2017-10-28 DIAGNOSIS — I119 Hypertensive heart disease without heart failure: Secondary | ICD-10-CM | POA: Diagnosis not present

## 2017-10-28 DIAGNOSIS — J9610 Chronic respiratory failure, unspecified whether with hypoxia or hypercapnia: Secondary | ICD-10-CM | POA: Diagnosis not present

## 2017-11-20 DIAGNOSIS — F331 Major depressive disorder, recurrent, moderate: Secondary | ICD-10-CM | POA: Diagnosis not present

## 2018-01-02 DIAGNOSIS — M25552 Pain in left hip: Secondary | ICD-10-CM | POA: Diagnosis not present

## 2018-01-13 DIAGNOSIS — M25552 Pain in left hip: Secondary | ICD-10-CM | POA: Diagnosis not present

## 2018-01-29 DIAGNOSIS — R0602 Shortness of breath: Secondary | ICD-10-CM | POA: Diagnosis not present

## 2018-01-29 DIAGNOSIS — Z9889 Other specified postprocedural states: Secondary | ICD-10-CM | POA: Diagnosis not present

## 2018-01-29 DIAGNOSIS — I270 Primary pulmonary hypertension: Secondary | ICD-10-CM | POA: Diagnosis not present

## 2018-01-29 DIAGNOSIS — R600 Localized edema: Secondary | ICD-10-CM | POA: Diagnosis not present

## 2018-02-03 ENCOUNTER — Ambulatory Visit: Payer: Self-pay | Admitting: Internal Medicine

## 2018-02-03 DIAGNOSIS — M7070 Other bursitis of hip, unspecified hip: Secondary | ICD-10-CM | POA: Insufficient documentation

## 2018-02-03 DIAGNOSIS — M1710 Unilateral primary osteoarthritis, unspecified knee: Secondary | ICD-10-CM | POA: Insufficient documentation

## 2018-02-03 DIAGNOSIS — M179 Osteoarthritis of knee, unspecified: Secondary | ICD-10-CM | POA: Insufficient documentation

## 2018-02-04 DIAGNOSIS — Z6841 Body Mass Index (BMI) 40.0 and over, adult: Secondary | ICD-10-CM | POA: Diagnosis not present

## 2018-02-04 DIAGNOSIS — Z720 Tobacco use: Secondary | ICD-10-CM | POA: Diagnosis not present

## 2018-03-06 ENCOUNTER — Emergency Department
Admission: EM | Admit: 2018-03-06 | Discharge: 2018-03-06 | Disposition: A | Discharge status: Home / Self Care | Payer: Medicare Other | Attending: Emergency Medicine | Admitting: Emergency Medicine

## 2018-03-06 ENCOUNTER — Encounter: Payer: Self-pay | Admitting: Emergency Medicine

## 2018-03-06 ENCOUNTER — Other Ambulatory Visit: Payer: Self-pay

## 2018-03-06 DIAGNOSIS — Z7982 Long term (current) use of aspirin: Secondary | ICD-10-CM | POA: Insufficient documentation

## 2018-03-06 DIAGNOSIS — N390 Urinary tract infection, site not specified: Secondary | ICD-10-CM | POA: Insufficient documentation

## 2018-03-06 DIAGNOSIS — J449 Chronic obstructive pulmonary disease, unspecified: Secondary | ICD-10-CM | POA: Diagnosis not present

## 2018-03-06 DIAGNOSIS — R1030 Lower abdominal pain, unspecified: Secondary | ICD-10-CM | POA: Diagnosis present

## 2018-03-06 DIAGNOSIS — I270 Primary pulmonary hypertension: Secondary | ICD-10-CM | POA: Insufficient documentation

## 2018-03-06 DIAGNOSIS — Z87891 Personal history of nicotine dependence: Secondary | ICD-10-CM | POA: Insufficient documentation

## 2018-03-06 DIAGNOSIS — Z79899 Other long term (current) drug therapy: Secondary | ICD-10-CM | POA: Diagnosis not present

## 2018-03-06 LAB — CBC
HCT: 40.1 % (ref 35.0–47.0)
Hemoglobin: 13.7 g/dL (ref 12.0–16.0)
MCH: 30.8 pg (ref 26.0–34.0)
MCHC: 34.2 g/dL (ref 32.0–36.0)
MCV: 90.1 fL (ref 80.0–100.0)
Platelets: 252 10*3/uL (ref 150–440)
RBC: 4.46 MIL/uL (ref 3.80–5.20)
RDW: 16.1 % — ABNORMAL HIGH (ref 11.5–14.5)
WBC: 7.7 10*3/uL (ref 3.6–11.0)

## 2018-03-06 LAB — URINALYSIS, COMPLETE (UACMP) WITH MICROSCOPIC
Bilirubin Urine: NEGATIVE
Glucose, UA: NEGATIVE mg/dL
KETONES UR: NEGATIVE mg/dL
Nitrite: NEGATIVE
Protein, ur: NEGATIVE mg/dL
Specific Gravity, Urine: 1.012 (ref 1.005–1.030)
pH: 7 (ref 5.0–8.0)

## 2018-03-06 LAB — COMPREHENSIVE METABOLIC PANEL
ALBUMIN: 4.2 g/dL (ref 3.5–5.0)
ALK PHOS: 71 U/L (ref 38–126)
ALT: 16 U/L (ref 14–54)
ANION GAP: 12 (ref 5–15)
AST: 22 U/L (ref 15–41)
BUN: 9 mg/dL (ref 6–20)
CHLORIDE: 102 mmol/L (ref 101–111)
CO2: 23 mmol/L (ref 22–32)
Calcium: 8.9 mg/dL (ref 8.9–10.3)
Creatinine, Ser: 0.7 mg/dL (ref 0.44–1.00)
GFR calc Af Amer: >60 mL/min (ref >60)
GFR calc non Af Amer: >60 mL/min (ref >60)
GLUCOSE: 114 mg/dL — AB (ref 65–99)
POTASSIUM: 3.4 mmol/L — AB (ref 3.5–5.1)
SODIUM: 137 mmol/L (ref 135–145)
Total Bilirubin: 0.7 mg/dL (ref 0.3–1.2)
Total Protein: 8.2 g/dL — ABNORMAL HIGH (ref 6.5–8.1)

## 2018-03-06 LAB — LIPASE, BLOOD: LIPASE: 28 U/L (ref 11–51)

## 2018-03-06 MED ORDER — CIPROFLOXACIN HCL 500 MG PO TABS
500.0000 mg | ORAL_TABLET | Freq: Once | ORAL | Status: AC
  Administered 2018-03-06: 500 mg via ORAL
  Filled 2018-03-06: qty 1

## 2018-03-06 MED ORDER — CIPROFLOXACIN HCL 500 MG PO TABS: 500.0000 mg | ORAL_TABLET | Freq: Two times a day (BID) | ORAL | 0 refills | Status: DC

## 2018-03-06 MED ORDER — ACETAMINOPHEN 325 MG PO TABS
650.0000 mg | ORAL_TABLET | Freq: Once | ORAL | Status: AC
  Administered 2018-03-06: 650 mg via ORAL
  Filled 2018-03-06: qty 2

## 2018-03-06 NOTE — ED Triage Notes (Signed)
Pt presents to ED c/o chills, fever, headache, and LLQ abd pain starting today.

## 2018-03-06 NOTE — Discharge Instructions (Signed)
You are evaluated for body aches and lower abdominal discomfort and found to have urinary tract infection.  As we discussed, you are being started on antibiotic Cipro.  Take antibiotics to completion.  Exam and evaluation are overall reassuring in the emergency department, we decided upon treating as an outpatient.  Return to the emergency room immediately for any worsening condition including fever after 24 hours, vomiting and cannot keep medications down concern for dehydration such as not making urine or dry mouth, dizziness or passing out, inability to urinate, or any other symptoms concerning to you.

## 2018-03-06 NOTE — ED Provider Notes (Signed)
[facility] Emergency Department Provider Note ____________________________________________   I have reviewed the triage vital signs and the triage nursing note.  HISTORY  Chief Complaint Fever and Abdominal Pain   Historian Patient  HPI Nicole Wyatt is a 52 y.o. female who reports body aches today, no nausea or vomiting, no respiratory symptoms or coughing, and mild lower abdominal discomfort without frank dysuria or hematuria.  No vomiting or diarrhea.  Symptoms are moderate.  Pain is mild.  Nothing makes it worse or better.  No known sick contacts.   Past Medical History:  Diagnosis Date  . Abnormal Pap smear of cervix   . Anxiety   . Arthritis   . Bipolar 1 disorder (HCC)   . COPD (chronic obstructive pulmonary disease) (HCC)   . Depression   . Oxygen dependent   . Seizures (HCC) 05/2013    Patient Active Problem List   Diagnosis Date Noted  . Bursitis of hip 02/03/2018  . Osteoarthritis of knee 02/03/2018  . Chronic pulmonary hypertension (HCC) 06/18/2016  . S/P cardiac cath 06/18/2016  . Unstable angina (HCC) 05/24/2016  . Bilateral leg edema 05/22/2016  . Shortness of breath 04/30/2016  . Skin lesion of right arm 03/01/2016  . Encounter for screening colonoscopy 06/10/2015  . Skin lesion of breast 06/10/2015  . Hx of seizure disorder 06/24/2014  . Left-sided weakness 06/24/2014  . Tobacco abuse 06/24/2014    Past Surgical History:  Procedure Laterality Date  . ABDOMINAL HYSTERECTOMY  1999  . BLADDER SURGERY    . CARDIAC CATHETERIZATION Bilateral 05/29/2016   Procedure: Right/Left Heart Cath and Coronary Angiography;  Surgeon: Lamar Blinks, MD;  Location: [facility] INVASIVE CV LAB;  Service: Cardiovascular;  Laterality: Bilateral;  . COLONOSCOPY    . COLONOSCOPY N/A 07/19/2015   Procedure: COLONOSCOPY;  Surgeon: Earline Mayotte, MD;  Location: [facility] ENDOSCOPY;  Service: Endoscopy;  Laterality: N/A;  . FLEXIBLE BRONCHOSCOPY  N/A 07/27/2016   Procedure: FLEXIBLE BRONCHOSCOPY;  Surgeon: Yevonne Pax, MD;  Location: [facility] ORS;  Service: Pulmonary;  Laterality: N/A;  . FOOT SURGERY    . TUBAL LIGATION      Prior to Admission medications   Medication Sig Start Date End Date Taking? Authorizing Provider  Acetaminophen 500 MG coapsule Take 1,000 mg by mouth every 6 (six) hours as needed.    [provider]  albuterol (PROVENTIL HFA;VENTOLIN HFA) 108 (90 Base) MCG/ACT inhaler Inhale 2 puffs into the lungs every 4 (four) hours as needed for wheezing or shortness of breath.     [provider]  aspirin EC 81 MG tablet Take 81 mg by mouth daily.    [provider]  ciprofloxacin (CIPRO) 500 MG tablet Take 1 tablet (500 mg total) by mouth 2 (two) times daily. 03/06/18   Governor Rooks, MD  diazepam (VALIUM) 5 MG tablet Take 1 tablet (5 mg total) by mouth every 8 (eight) hours as needed for muscle spasms. 10/07/17   Emily Filbert, MD  EPINEPHrine (EPIPEN 2-PAK) 0.3 mg/0.3 mL IJ SOAJ injection Inject 0.3 mg into the muscle once. As needed for allergic reaction.    [provider]  Eslicarbazepine Acetate (APTIOM) 800 MG TABS Take 800 mg by mouth daily.    [provider]  furosemide (LASIX) 40 MG tablet Take 40 mg by mouth daily.    [provider]  ibuprofen (ADVIL,MOTRIN) 800 MG tablet Take 1 tablet (800 mg total) by mouth every 8 (eight) hours as needed  for mild pain or moderate pain (with food). Patient not taking: Reported on 10/07/2017 02/18/17   Rockne Menghini, MD  ibuprofen (ADVIL,MOTRIN) 800 MG tablet Take 1 tablet (800 mg total) by mouth every 8 (eight) hours as needed. 10/07/17   Emily Filbert, MD  isosorbide mononitrate (IMDUR) 30 MG 24 hr tablet Take 30 mg by mouth daily.    [provider]  Lacosamide (VIMPAT) 100 MG TABS Take 1 tablet (100 mg total) by mouth 2 (two) times daily. Patient not taking: Reported on 10/07/2017 09/25/15    Emily Filbert, MD  levETIRAcetam (KEPPRA) 750 MG tablet Take 1,500 mg by mouth 2 (two) times daily.    [provider]  meloxicam (MOBIC) 15 MG tablet Take 15 mg by mouth daily.    [provider]  mometasone [facility]) 220 MCG/INH inhaler Inhale 2 puffs into the lungs daily.    [provider]  OXYGEN Inhale 2 L into the lungs daily.    [provider]  sertraline (ZOLOFT) 100 MG tablet Take 200 mg by mouth daily.    [provider]  spironolactone (ALDACTONE) 25 MG tablet Take 25 mg by mouth daily. 10/16/16 10/16/17  [provider]  venlafaxine XR (EFFEXOR-XR) 75 MG 24 hr capsule Take 75 mg by mouth daily with breakfast.     [provider]    Allergies  Allergen Reactions  . Bee Venom Anaphylaxis  . Penicillins Anaphylaxis and Nausea And Vomiting    Has patient had a PCN reaction causing immediate rash, facial/tongue/throat swelling, SOB or lightheadedness with hypotension: yes Has patient had a PCN reaction causing severe rash involving mucus membranes or skin necrosis: {yes Has patient had a PCN reaction that required hospitalization yes Has patient had a PCN reaction occurring within the last 10 years: {yes If all of the above answers are "NO", then may proceed with Cephalosporin use.  Marland Kitchen Percocet [Oxycodone-Acetaminophen] Hives    Family History  Problem Relation Age of Onset  . Hypertension Mother   . Arthritis Mother   . Hypertension Father   . Arthritis Father   . Prostate cancer Father   . Ovarian cancer Maternal Grandmother     Social History Social History   Tobacco Use  . Smoking status: Former Smoker    Packs/day: 1.00    Years: 14.00    Pack years: 14.00    Types: Cigarettes    Last attempt to quit: 05/07/2016    Years since quitting: 1.8  . Smokeless tobacco: Never Used  Substance Use Topics  . Alcohol use: No    Alcohol/week: 0.0 oz  . Drug use: No    Review of  Systems  Constitutional: Negative for fever by her own history. Eyes: Negative for visual changes. ENT: Negative for sore throat. Cardiovascular: Negative for chest pain. Respiratory: Negative for shortness of breath. Gastrointestinal: Negative for vomiting and diarrhea. Genitourinary: Negative for dysuria or hematuria.  Denies pelvic pain, vaginal discharge. Musculoskeletal: Negative for back pain. Skin: Negative for rash. Neurological: Negative for headache.  ____________________________________________   PHYSICAL EXAM:  VITAL SIGNS: ED Triage Vitals  Enc Vitals Group     BP 03/06/18 1742 126/82     Pulse Rate 03/06/18 1742 88     Resp 03/06/18 1742 18     Temp 03/06/18 1742 (!) 100.5 F (38.1 C)     Temp Source 03/06/18 1742 Oral     SpO2 03/06/18 1742 97 %     Weight 03/06/18 1742  245 lb (111.1 kg)     Height 03/06/18 1742 5\' 7"  (1.702 m)     Head Circumference --      Peak Flow --      Pain Score 03/06/18 1744 8     Pain Loc --      Pain Edu? --      Excl. in GC? --      Constitutional: Alert and oriented. Well appearing and in no distress. HEENT   Head: Normocephalic and atraumatic.      Eyes: Conjunctivae are normal. Pupils equal and round.       Ears:         Nose: No congestion/rhinnorhea.   Mouth/Throat: Mucous membranes are moist.   Neck: No stridor. Cardiovascular/Chest: Normal rate, regular rhythm.  No murmurs, rubs, or gallops. Respiratory: Normal respiratory effort without tachypnea nor retractions. Breath sounds are clear and equal bilaterally. No wheezes/rales/rhonchi. Gastrointestinal: Soft. No distention, no guarding, no rebound.  Very mild discomfort suprapubic area without any focal McBurney's point tenderness. Genitourinary/rectal:Deferred Musculoskeletal: Nontender with normal range of motion in all extremities. No joint effusions.  No lower extremity tenderness.  No edema. Neurologic:  Normal speech and language. No gross or focal  neurologic deficits are appreciated. Skin:  Skin is warm, dry and intact. No rash noted. Psychiatric: Mood and affect are normal. Speech and behavior are normal. Patient exhibits appropriate insight and judgment.   ____________________________________________  LABS (pertinent positives/negatives) I, Governor Rooksebecca Khalin Royce, MD the attending physician have reviewed the labs noted below.  Labs Reviewed  COMPREHENSIVE METABOLIC PANEL - Abnormal; Notable for the following components:      Result Value   Potassium 3.4 (*)    Glucose, Bld 114 (*)    Total Protein 8.2 (*)    All other components within normal limits  CBC - Abnormal; Notable for the following components:   RDW 16.1 (*)    All other components within normal limits  URINALYSIS, COMPLETE (UACMP) WITH MICROSCOPIC - Abnormal; Notable for the following components:   Color, Urine YELLOW (*)    APPearance CLEAR (*)    Hgb urine dipstick MODERATE (*)    Leukocytes, UA TRACE (*)    Bacteria, UA RARE (*)    Squamous Epithelial / LPF 0-5 (*)    All other components within normal limits  URINE CULTURE  CULTURE, BLOOD (ROUTINE X 2)  CULTURE, BLOOD (ROUTINE X 2)  LIPASE, BLOOD  POC URINE PREG, ED    __________________________________________  PROCEDURES  Procedure(s) performed: None  Procedures  Critical Care performed: None   ____________________________________________  ED COURSE / ASSESSMENT AND PLAN  Pertinent labs & imaging results that were available during my care of the patient were reviewed by me and considered in my medical decision making (see chart for details).    Patient has low-grade temperature and is overall well-appearing.  Urinalysis consistent with urinary tract infection.  She does have some mild suprapubic discomfort.  She is denying pelvic symptoms and declines pelvic exam.  She has no upper respiratory symptoms.  I do not suspect sepsis.  I am to go ahead and send blood cultures so that those are  pending, however I do not have a suspicion for that and I am going to go ahead and discharge her home for outpatient management.  Here she was started on Cipro due to penicillin allergy.  DIFFERENTIAL DIAGNOSIS: Including but not limited to viral illness, influenza, urinary tract infection, pneumonia, etc.  CONSULTATIONS: None   Patient /  Family / Caregiver informed of clinical course, medical decision-making process, and agree with plan.   I discussed return precautions, follow-up instructions, and discharge instructions with patient and/or family.  Discharge Instructions : You are evaluated for body aches and lower abdominal discomfort and found to have urinary tract infection.  As we discussed, you are being started on antibiotic Cipro.  Take antibiotics to completion.  Exam and evaluation are overall reassuring in the emergency department, we decided upon treating as an outpatient.  Return to the emergency room immediately for any worsening condition including fever after 24 hours, vomiting and cannot keep medications down concern for dehydration such as not making urine or dry mouth, dizziness or passing out, inability to urinate, or any other symptoms concerning to you.    ___________________________________________   FINAL CLINICAL IMPRESSION(S) / ED DIAGNOSES   Final diagnoses:  Urinary tract infection without hematuria, site unspecified      ___________________________________________        Note: This dictation was prepared with Dragon dictation. Any transcriptional errors that result from this process are unintentional    Governor Rooks, MD 03/06/18 1928

## 2018-03-06 NOTE — ED Notes (Signed)
Informed RN that patient has been roomed and is ready for evaluation.  Patient in NAD at this time and call bell placed within reach.   

## 2018-03-09 LAB — URINE CULTURE: Culture: 80000 — AB

## 2018-03-11 LAB — CULTURE, BLOOD (ROUTINE X 2)
CULTURE: NO GROWTH
Culture: NO GROWTH
SPECIAL REQUESTS: ADEQUATE
Special Requests: ADEQUATE

## 2018-03-25 ENCOUNTER — Ambulatory Visit: Payer: Medicare Other | Admitting: Nurse Practitioner

## 2018-03-25 ENCOUNTER — Encounter: Payer: Self-pay | Admitting: Nurse Practitioner

## 2018-03-25 VITALS — BP 130/80 | HR 80 | Resp 16 | Ht 67.0 in | Wt 261.4 lb

## 2018-03-25 DIAGNOSIS — R601 Generalized edema: Secondary | ICD-10-CM

## 2018-03-25 DIAGNOSIS — F331 Major depressive disorder, recurrent, moderate: Secondary | ICD-10-CM | POA: Diagnosis not present

## 2018-03-25 DIAGNOSIS — B372 Candidiasis of skin and nail: Secondary | ICD-10-CM

## 2018-03-25 DIAGNOSIS — M7072 Other bursitis of hip, left hip: Secondary | ICD-10-CM | POA: Diagnosis not present

## 2018-03-25 DIAGNOSIS — T782XXD Anaphylactic shock, unspecified, subsequent encounter: Secondary | ICD-10-CM

## 2018-03-25 DIAGNOSIS — R0602 Shortness of breath: Secondary | ICD-10-CM | POA: Diagnosis not present

## 2018-03-25 MED ORDER — IBUPROFEN 800 MG PO TABS: 800.0000 mg | ORAL_TABLET | Freq: Three times a day (TID) | ORAL | 0 refills | Status: DC | PRN

## 2018-03-25 MED ORDER — EPINEPHRINE 0.3 MG/0.3ML IJ SOAJ: 0.3000 mg | Freq: Once | INTRAMUSCULAR | 0 refills | Status: AC

## 2018-03-25 MED ORDER — FUROSEMIDE 40 MG PO TABS: 40.0000 mg | ORAL_TABLET | Freq: Every day | ORAL | 3 refills | Status: DC

## 2018-03-25 MED ORDER — CLOTRIMAZOLE-BETAMETHASONE 1-0.05 % EX CREA: 1.0000 "application " | TOPICAL_CREAM | Freq: Two times a day (BID) | CUTANEOUS | 1 refills | Status: DC

## 2018-03-25 MED ORDER — ALBUTEROL SULFATE (2.5 MG/3ML) 0.083% IN NEBU: 2.5000 mg | INHALATION_SOLUTION | Freq: Four times a day (QID) | RESPIRATORY_TRACT | 12 refills | Status: DC | PRN

## 2018-03-25 MED ORDER — SERTRALINE HCL 100 MG PO TABS: 200.0000 mg | ORAL_TABLET | Freq: Every day | ORAL | 3 refills | Status: DC

## 2018-03-25 MED ORDER — VENLAFAXINE HCL ER 75 MG PO CP24: 75.0000 mg | ORAL_CAPSULE | Freq: Every day | ORAL | 3 refills | Status: DC

## 2018-03-25 MED ORDER — NYSTATIN 100000 UNIT/GM EX POWD: Freq: Four times a day (QID) | CUTANEOUS | 3 refills | Status: DC

## 2018-03-25 NOTE — Progress Notes (Signed)
[facility] [address] [place], [place] [address]  Internal MEDICINE  Office Visit Note  Patient Name: Nicole Wyatt  [id number]  [id number]  Date of Service: 03/26/2018   Complaints/HPI Pt is here for establishment of PCP.  The patient is here to establish primary care provider. Her prior PCP has retired and she has not had visit for some time. Has chronic depression. Has been out of medications for over a month and needs to restart them . Her mother has been very ill, and dementia is worsening. This is causing her increased depression and not having medications is very difficult. She also has severe rash. Rash is in the folds of skin on abdomen and under the breasts. Painful and burns when touched. Has been using almond oil and keeping area dry, but this does not seem to help.    Current Medication: Outpatient Encounter Medications as of 03/25/2018  Medication Sig  . Acetaminophen 500 MG coapsule Take 1,000 mg by mouth every 6 (six) hours as needed.  Marland Kitchen albuterol (PROVENTIL HFA;VENTOLIN HFA) 108 (90 Base) MCG/ACT inhaler Inhale 2 puffs into the lungs every 4 (four) hours as needed for wheezing or shortness of breath.   Marland Kitchen aspirin EC 81 MG tablet Take 81 mg by mouth daily.  . diazepam (VALIUM) 5 MG tablet Take 1 tablet (5 mg total) by mouth every 8 (eight) hours as needed for muscle spasms.  . [EXPIRED] EPINEPHrine (EPIPEN 2-PAK) 0.3 mg/0.3 mL IJ SOAJ injection Inject 0.3 mLs (0.3 mg total) into the muscle once for 1 dose. As needed for allergic reaction.  . Eslicarbazepine Acetate (APTIOM) 800 MG TABS Take 800 mg by mouth daily.  . furosemide (LASIX) 40 MG tablet Take 1 tablet (40 mg total) by mouth daily.  Marland Kitchen ibuprofen (ADVIL,MOTRIN) 800 MG tablet Take 1 tablet (800 mg total) by mouth every 8 (eight) hours as needed.  . isosorbide mononitrate (IMDUR) 30 MG 24 hr tablet Take 30 mg by mouth daily.  . Lacosamide (VIMPAT) 100 MG TABS Take 1 tablet (100 mg total) by mouth 2  (two) times daily.  Marland Kitchen levETIRAcetam (KEPPRA) 750 MG tablet Take 1,500 mg by mouth 2 (two) times daily.  . meloxicam (MOBIC) 15 MG tablet Take 15 mg by mouth daily.  . mometasone (ASMANEX) 220 MCG/INH inhaler Inhale 2 puffs into the lungs daily.  . OXYGEN Inhale 2 L into the lungs daily.  . sertraline (ZOLOFT) 100 MG tablet Take 2 tablets (200 mg total) by mouth daily.  Marland Kitchen venlafaxine XR (EFFEXOR-XR) 75 MG 24 hr capsule Take 1 capsule (75 mg total) by mouth daily with breakfast.  . [DISCONTINUED] EPINEPHrine (EPIPEN 2-PAK) 0.3 mg/0.3 mL IJ SOAJ injection Inject 0.3 mg into the muscle once. As needed for allergic reaction.  . [DISCONTINUED] furosemide (LASIX) 40 MG tablet Take 40 mg by mouth daily.  . [DISCONTINUED] ibuprofen (ADVIL,MOTRIN) 800 MG tablet Take 1 tablet (800 mg total) by mouth every 8 (eight) hours as needed for mild pain or moderate pain (with food).  . [DISCONTINUED] ibuprofen (ADVIL,MOTRIN) 800 MG tablet Take 1 tablet (800 mg total) by mouth every 8 (eight) hours as needed.  . [DISCONTINUED] sertraline (ZOLOFT) 100 MG tablet Take 200 mg by mouth daily.  . [DISCONTINUED] venlafaxine XR (EFFEXOR-XR) 75 MG 24 hr capsule Take 75 mg by mouth daily with breakfast.   . albuterol (PROVENTIL) (2.5 MG/3ML) 0.083% nebulizer solution Take 3 mLs (2.5 mg total) by nebulization every 6 (six) hours as needed for wheezing  or shortness of breath.  . ciprofloxacin (CIPRO) 500 MG tablet Take 1 tablet (500 mg total) by mouth 2 (two) times daily. (Patient not taking: Reported on 03/25/2018)  . clotrimazole-betamethasone (LOTRISONE) cream Apply 1 application topically 2 (two) times daily.  Marland Kitchen nystatin (NYSTATIN) powder Apply topically 4 (four) times daily.  Marland Kitchen spironolactone (ALDACTONE) 25 MG tablet Take 25 mg by mouth daily.   No facility-administered encounter medications on file as of 03/25/2018.     Surgical History: Past Surgical History:  Procedure Laterality Date  . ABDOMINAL HYSTERECTOMY  1999  .  BLADDER SURGERY    . CARDIAC CATHETERIZATION Bilateral 05/29/2016   Procedure: Right/Left Heart Cath and Coronary Angiography;  Surgeon: Lamar Blinks, MD;  Location: [facility] INVASIVE CV LAB;  Service: Cardiovascular;  Laterality: Bilateral;  . COLONOSCOPY    . COLONOSCOPY N/A 07/19/2015   Procedure: COLONOSCOPY;  Surgeon: Earline Mayotte, MD;  Location: [facility] ENDOSCOPY;  Service: Endoscopy;  Laterality: N/A;  . FLEXIBLE BRONCHOSCOPY N/A 07/27/2016   Procedure: FLEXIBLE BRONCHOSCOPY;  Surgeon: Yevonne Pax, MD;  Location: [facility] ORS;  Service: Pulmonary;  Laterality: N/A;  . FOOT SURGERY    . TUBAL LIGATION      Medical History: Past Medical History:  Diagnosis Date  . Abnormal Pap smear of cervix   . Anxiety   . Arthritis   . Bipolar 1 disorder (HCC)   . COPD (chronic obstructive pulmonary disease) (HCC)   . Depression   . Oxygen dependent   . Seizures (HCC) 05/2013    Family History: Family History  Problem Relation Age of Onset  . Hypertension Mother   . Arthritis Mother   . Hypertension Father   . Arthritis Father   . Prostate cancer Father   . Ovarian cancer Maternal Grandmother     Social History   Socioeconomic History  . Marital status: Single    Spouse name: Not on file  . Number of children: Not on file  . Years of education: Not on file  . Highest education level: Not on file  Occupational History  . Not on file  Social Needs  . Financial resource strain: Not on file  . Food insecurity:    Worry: Not on file    Inability: Not on file  . Transportation needs:    Medical: Not on file    Non-medical: Not on file  Tobacco Use  . Smoking status: Former Smoker    Packs/day: 1.00    Years: 14.00    Pack years: 14.00    Types: Cigarettes    Last attempt to quit: 05/07/2016    Years since quitting: 1.8  . Smokeless tobacco: Never Used  Substance and Sexual Activity  . Alcohol use: No    Alcohol/week: 0.0 oz  . Drug use: No  . Sexual activity: Never    Lifestyle  . Physical activity:    Days per week: Not on file    Minutes per session: Not on file  . Stress: Not on file  Relationships  . Social connections:    Talks on phone: Not on file    Gets together: Not on file    Attends religious service: Not on file    Active member of club or organization: Not on file    Attends meetings of clubs or organizations: Not on file    Relationship status: Not on file  . Intimate partner violence:    Fear of current or ex partner: Not on file  Emotionally abused: Not on file    Physically abused: Not on file    Forced sexual activity: Not on file  Other Topics Concern  . Not on file  Social History Narrative  . Not on file     Review of Systems  Constitutional: Positive for fatigue. Negative for chills and unexpected weight change.  HENT: Negative for congestion, postnasal drip, rhinorrhea, sneezing and sore throat.   Eyes: Negative.  Negative for redness.  Respiratory: Positive for shortness of breath and wheezing. Negative for cough and chest tightness.   Cardiovascular: Negative for chest pain and palpitations.  Gastrointestinal: Negative for abdominal pain, constipation, diarrhea, nausea and vomiting.  Endocrine: Negative for cold intolerance, heat intolerance, polydipsia, polyphagia and polyuria.  Genitourinary: Negative for dysuria and frequency.  Musculoskeletal: Negative for arthralgias, back pain, joint swelling and neck pain.  Skin: Positive for rash.  Allergic/Immunologic: Positive for environmental allergies.  Neurological: Negative for tremors, numbness and headaches.  Hematological: Negative for adenopathy. Does not bruise/bleed easily.  Psychiatric/Behavioral: Positive for dysphoric mood. Negative for behavioral problems (Depression), sleep disturbance and suicidal ideas. The patient is nervous/anxious.     Today's Vitals   03/25/18 0958 03/25/18 1003  BP: 130/80   Pulse: 80   Resp: 16   SpO2: 98% 98%  Weight:  261 lb 6.4 oz (118.6 kg)   Height: 5\' 7"  (1.702 m)     Physical Exam  Constitutional: She is oriented to person, place, and time. She appears well-developed and well-nourished. No distress.  HENT:  Head: Normocephalic and atraumatic.  Mouth/Throat: Oropharynx is clear and moist. No oropharyngeal exudate.  Eyes: Pupils are equal, round, and reactive to light. EOM are normal.  Neck: Normal range of motion. Neck supple. No JVD present. Carotid bruit is not present. No tracheal deviation present. No thyromegaly present.  Cardiovascular: Normal rate, regular rhythm and normal heart sounds. Exam reveals no gallop and no friction rub.  No murmur heard. Pulmonary/Chest: Effort normal and breath sounds normal. No respiratory distress. She has no wheezes. She has no rales. She exhibits no tenderness.  Patient wearing nasal cannula oxygen at all times   Abdominal: Soft. Bowel sounds are normal. There is no tenderness.  Musculoskeletal: Normal range of motion.  Lymphadenopathy:    She has no cervical adenopathy.  Neurological: She is alert and oriented to person, place, and time. No cranial nerve deficit.  Skin: Skin is warm and dry. Rash noted. She is not diaphoretic.     Psychiatric: Her speech is normal and behavior is normal. Judgment and thought content normal. Her mood appears anxious. Cognition and memory are normal. She exhibits a depressed mood.  Nursing note and vitals reviewed.  Assessment/Plan:  1. Shortness of breath Continue regular visits with pulmonology as scheduled. Use inhalers and respiratory treatments as neeed and as prescribed. Continue oxygen at 2 liters per minute.  - albuterol (PROVENTIL) (2.5 MG/3ML) 0.083% nebulizer solution; Take 3 mLs (2.5 mg total) by nebulization every 6 (six) hours as needed for wheezing or shortness of breath.  Dispense: 75 mL; Refill: 12  2. Bursitis of left hip, unspecified bursa - ibuprofen (ADVIL,MOTRIN) 800 MG tablet; Take 1 tablet (800 mg  total) by mouth every 8 (eight) hours as needed.  Dispense: 30 tablet; Refill: 0  3. Cutaneous candidiasis - clotrimazole-betamethasone (LOTRISONE) cream; Apply 1 application topically 2 (two) times daily.  Dispense: 45 g; Refill: 1 - nystatin (NYSTATIN) powder; Apply topically 4 (four) times daily.  Dispense: 60 g; Refill: 3  4. Generalized edema - furosemide (LASIX) 40 MG tablet; Take 1 tablet (40 mg total) by mouth daily.  Dispense: 30 tablet; Refill: 3  5. Depression, major, recurrent, moderate (HCC) - sertraline (ZOLOFT) 100 MG tablet; Take 2 tablets (200 mg total) by mouth daily.  Dispense: 60 tablet; Refill: 3 - venlafaxine XR (EFFEXOR-XR) 75 MG 24 hr capsule; Take 1 capsule (75 mg total) by mouth daily with breakfast.  Dispense: 30 capsule; Refill: 3  6. Anaphylaxis, subsequent encounter - EPINEPHrine (EPIPEN 2-PAK) 0.3 mg/0.3 mL IJ SOAJ injection; Inject 0.3 mLs (0.3 mg total) into the muscle once for 1 dose. As needed for allergic reaction.  Dispense: 0.3 mL; Refill: 0   General Counseling: Mildred verbalizes understanding of the findings of todays visit and agrees with plan of treatment. I have discussed any further diagnostic evaluation that may be needed or ordered today. We also reviewed her medications today. she has been encouraged to call the office with any questions or concerns that should arise related to todays visit.   This patient was seen by Vincent GrosHeather Leonor Darnell, FNP- C in Collaboration with Dr Lyndon CodeFozia M Khan as a part of collaborative care agreement     Meds ordered this encounter  Medications  . EPINEPHrine (EPIPEN 2-PAK) 0.3 mg/0.3 mL IJ SOAJ injection    Sig: Inject 0.3 mLs (0.3 mg total) into the muscle once for 1 dose. As needed for allergic reaction.    Dispense:  0.3 mL    Refill:  0    Order Specific Question:   Supervising Provider    Answer:   Lyndon CodeKHAN, FOZIA M [[id number]]  . furosemide (LASIX) 40 MG tablet    Sig: Take 1 tablet (40 mg total) by mouth daily.     Dispense:  30 tablet    Refill:  3    Order Specific Question:   Supervising Provider    Answer:   Lyndon CodeKHAN, FOZIA M [[id number]]  . sertraline (ZOLOFT) 100 MG tablet    Sig: Take 2 tablets (200 mg total) by mouth daily.    Dispense:  60 tablet    Refill:  3    Order Specific Question:   Supervising Provider    Answer:   Lyndon CodeKHAN, FOZIA M [[id number]]  . venlafaxine XR (EFFEXOR-XR) 75 MG 24 hr capsule    Sig: Take 1 capsule (75 mg total) by mouth daily with breakfast.    Dispense:  30 capsule    Refill:  3    Order Specific Question:   Supervising Provider    Answer:   Lyndon CodeKHAN, FOZIA M [[id number]]  . ibuprofen (ADVIL,MOTRIN) 800 MG tablet    Sig: Take 1 tablet (800 mg total) by mouth every 8 (eight) hours as needed.    Dispense:  30 tablet    Refill:  0    Order Specific Question:   Supervising Provider    Answer:   Lyndon CodeKHAN, FOZIA M [[id number]]  . albuterol (PROVENTIL) (2.5 MG/3ML) 0.083% nebulizer solution    Sig: Take 3 mLs (2.5 mg total) by nebulization every 6 (six) hours as needed for wheezing or shortness of breath.    Dispense:  75 mL    Refill:  12    Order Specific Question:   Supervising Provider    Answer:   Lyndon CodeKHAN, FOZIA M [[id number]]  . clotrimazole-betamethasone (LOTRISONE) cream    Sig: Apply 1 application topically 2 (two) times daily.    Dispense:  45 g    Refill:  1    Order  Specific Question:   Supervising Provider    Answer:   Lyndon Code [[id number]]  . nystatin (NYSTATIN) powder    Sig: Apply topically 4 (four) times daily.    Dispense:  60 g    Refill:  3    Order Specific Question:   Supervising Provider    Answer:   Lyndon Code [[id number]]    Time spent: 25 Minutes

## 2018-03-26 DIAGNOSIS — T782XXA Anaphylactic shock, unspecified, initial encounter: Secondary | ICD-10-CM | POA: Insufficient documentation

## 2018-03-26 DIAGNOSIS — F331 Major depressive disorder, recurrent, moderate: Secondary | ICD-10-CM | POA: Insufficient documentation

## 2018-03-26 DIAGNOSIS — B372 Candidiasis of skin and nail: Secondary | ICD-10-CM | POA: Insufficient documentation

## 2018-03-26 DIAGNOSIS — R601 Generalized edema: Secondary | ICD-10-CM | POA: Insufficient documentation

## 2018-04-09 ENCOUNTER — Ambulatory Visit: Payer: Self-pay

## 2018-04-14 ENCOUNTER — Ambulatory Visit: Payer: Self-pay | Admitting: Internal Medicine

## 2018-04-17 ENCOUNTER — Ambulatory Visit: Payer: Self-pay | Admitting: Internal Medicine

## 2018-05-06 ENCOUNTER — Encounter: Payer: Self-pay | Admitting: Nurse Practitioner

## 2018-06-02 ENCOUNTER — Emergency Department
Admission: EM | Admit: 2018-06-02 | Discharge: 2018-06-02 | Disposition: A | Discharge status: Home / Self Care | Payer: Medicare Other | Attending: Student in an Organized Health Care Education/Training Program | Admitting: Student in an Organized Health Care Education/Training Program

## 2018-06-02 ENCOUNTER — Encounter: Payer: Self-pay | Admitting: Emergency Medicine

## 2018-06-02 ENCOUNTER — Other Ambulatory Visit: Payer: Self-pay

## 2018-06-02 DIAGNOSIS — Z87891 Personal history of nicotine dependence: Secondary | ICD-10-CM | POA: Insufficient documentation

## 2018-06-02 DIAGNOSIS — S80862A Insect bite (nonvenomous), left lower leg, initial encounter: Secondary | ICD-10-CM | POA: Diagnosis not present

## 2018-06-02 DIAGNOSIS — F319 Bipolar disorder, unspecified: Secondary | ICD-10-CM | POA: Diagnosis not present

## 2018-06-02 DIAGNOSIS — Y998 Other external cause status: Secondary | ICD-10-CM | POA: Insufficient documentation

## 2018-06-02 DIAGNOSIS — Y9389 Activity, other specified: Secondary | ICD-10-CM | POA: Insufficient documentation

## 2018-06-02 DIAGNOSIS — S0096XA Insect bite (nonvenomous) of unspecified part of head, initial encounter: Secondary | ICD-10-CM | POA: Diagnosis not present

## 2018-06-02 DIAGNOSIS — J449 Chronic obstructive pulmonary disease, unspecified: Secondary | ICD-10-CM | POA: Insufficient documentation

## 2018-06-02 DIAGNOSIS — Z7982 Long term (current) use of aspirin: Secondary | ICD-10-CM | POA: Insufficient documentation

## 2018-06-02 DIAGNOSIS — R21 Rash and other nonspecific skin eruption: Secondary | ICD-10-CM | POA: Diagnosis not present

## 2018-06-02 DIAGNOSIS — Z79899 Other long term (current) drug therapy: Secondary | ICD-10-CM | POA: Insufficient documentation

## 2018-06-02 DIAGNOSIS — Y929 Unspecified place or not applicable: Secondary | ICD-10-CM | POA: Insufficient documentation

## 2018-06-02 DIAGNOSIS — F419 Anxiety disorder, unspecified: Secondary | ICD-10-CM | POA: Insufficient documentation

## 2018-06-02 DIAGNOSIS — W57XXXA Bitten or stung by nonvenomous insect and other nonvenomous arthropods, initial encounter: Secondary | ICD-10-CM | POA: Diagnosis not present

## 2018-06-02 MED ORDER — DOXYCYCLINE HYCLATE 100 MG PO CAPS: 100.0000 mg | ORAL_CAPSULE | Freq: Two times a day (BID) | ORAL | 0 refills | Status: DC

## 2018-06-02 MED ORDER — TRIAMCINOLONE ACETONIDE 0.5 % EX OINT
1.0000 | TOPICAL_OINTMENT | Freq: Two times a day (BID) | CUTANEOUS | 0 refills | Status: DC
Start: 2018-06-02 — End: 2018-11-17

## 2018-06-02 NOTE — ED Triage Notes (Signed)
States got tick bite L lower leg 4 weeks ago. Small wound still noted. 3 days ago 3 other apparent insect bites noted. Leg sore.

## 2018-06-02 NOTE — ED Provider Notes (Signed)
[facility] Emergency Department Provider Note  ____________________________________________  Time seen: Approximately 9:25 PM  I have reviewed the triage vital signs and the nursing notes.   HISTORY  Chief Complaint Insect Bite   HPI Nicole Wyatt is a 52 y.o. female who presents to the emergency department for treatment and evaluation of insect bite that has been present for the last month.  She states that she had a difficult time removing a tick from her left lower leg and head is not healed well.  She states that 4 days ago, she noticed a red rash on the lower leg in the area of the tick bite and also noticed a similar area in her scalp.  She states that she has felt tired, has had chills and body aches but no specific fever.  She states that the areas are painful.  She has applied hydrocortisone cream without any relief. Past Medical History:  Diagnosis Date  . Abnormal Pap smear of cervix   . Anxiety   . Arthritis   . Bipolar 1 disorder (HCC)   . COPD (chronic obstructive pulmonary disease) (HCC)   . Depression   . Oxygen dependent   . Seizures (HCC) 05/2013    Patient Active Problem List   Diagnosis Date Noted  . Depression, major, recurrent, moderate (HCC) 03/26/2018  . Generalized edema 03/26/2018  . Anaphylactic syndrome 03/26/2018  . Cutaneous candidiasis 03/26/2018  . Bursitis of hip 02/03/2018  . Osteoarthritis of knee 02/03/2018  . Chronic pulmonary hypertension (HCC) 06/18/2016  . S/P cardiac cath 06/18/2016  . Unstable angina (HCC) 05/24/2016  . Bilateral leg edema 05/22/2016  . Shortness of breath 04/30/2016  . Skin lesion of right arm 03/01/2016  . Encounter for screening colonoscopy 06/10/2015  . Skin lesion of breast 06/10/2015  . Hx of seizure disorder 06/24/2014  . Left-sided weakness 06/24/2014  . Tobacco abuse 06/24/2014    Past Surgical History:  Procedure Laterality Date  . ABDOMINAL HYSTERECTOMY  1999  .  BLADDER SURGERY    . CARDIAC CATHETERIZATION Bilateral 05/29/2016   Procedure: Right/Left Heart Cath and Coronary Angiography;  Surgeon: Lamar BlinksBruce J Kowalski, MD;  Location: [facility] INVASIVE CV LAB;  Service: Cardiovascular;  Laterality: Bilateral;  . COLONOSCOPY    . COLONOSCOPY N/A 07/19/2015   Procedure: COLONOSCOPY;  Surgeon: Earline MayotteJeffrey W Byrnett, MD;  [facility];  Service: Endoscopy;  Laterality: N/A;  . FLEXIBLE BRONCHOSCOPY N/A 07/27/2016   Procedure: FLEXIBLE BRONCHOSCOPY;  Surgeon: Yevonne PaxSaadat A Khan, MD;  Location: [facility] ORS;  Service: Pulmonary;  Laterality: N/A;  . FOOT SURGERY    . TUBAL LIGATION      Prior to Admission medications   Medication Sig Start Date End Date Taking? Authorizing Provider  Acetaminophen 500 MG coapsule Take 1,000 mg by mouth every 6 (six) hours as needed.    [provider]  albuterol (PROVENTIL HFA;VENTOLIN HFA) 108 (90 Base) MCG/ACT inhaler Inhale 2 puffs into the lungs every 4 (four) hours as needed for wheezing or shortness of breath.     [provider]  albuterol (PROVENTIL) (2.5 MG/3ML) 0.083% nebulizer solution Take 3 mLs (2.5 mg total) by nebulization every 6 (six) hours as needed for wheezing or shortness of breath. 03/25/18   Carlean JewsBoscia, Heather E, NP  aspirin EC 81 MG tablet Take 81 mg by mouth daily.    [provider]  ciprofloxacin (CIPRO) 500 MG tablet Take 1 tablet (500 mg total) by mouth 2 (two) times daily. Patient not  taking: Reported on 03/25/2018 03/06/18   Governor Rooks, MD  clotrimazole-betamethasone (LOTRISONE) cream Apply 1 application topically 2 (two) times daily. 03/25/18   Carlean Jews, NP  diazepam (VALIUM) 5 MG tablet Take 1 tablet (5 mg total) by mouth every 8 (eight) hours as needed for muscle spasms. 10/07/17   Emily Filbert, MD  doxycycline (VIBRAMYCIN) 100 MG capsule Take 1 capsule (100 mg total) by mouth 2 (two) times daily. 06/02/18   Mishti Swanton B, FNP  Eslicarbazepine Acetate (APTIOM) 800 MG  TABS Take 800 mg by mouth daily.    [provider]  furosemide (LASIX) 40 MG tablet Take 1 tablet (40 mg total) by mouth daily. 03/25/18   Carlean Jews, NP  ibuprofen (ADVIL,MOTRIN) 800 MG tablet Take 1 tablet (800 mg total) by mouth every 8 (eight) hours as needed. 03/25/18   Carlean Jews, NP  isosorbide mononitrate (IMDUR) 30 MG 24 hr tablet Take 30 mg by mouth daily.    [provider]  Lacosamide (VIMPAT) 100 MG TABS Take 1 tablet (100 mg total) by mouth 2 (two) times daily. 09/25/15   Emily Filbert, MD  levETIRAcetam (KEPPRA) 750 MG tablet Take 1,500 mg by mouth 2 (two) times daily.    [provider]  meloxicam (MOBIC) 15 MG tablet Take 15 mg by mouth daily.    [provider]  mometasone [facility]) 220 MCG/INH inhaler Inhale 2 puffs into the lungs daily.    [provider]  nystatin (NYSTATIN) powder Apply topically 4 (four) times daily. 03/25/18   Carlean Jews, NP  OXYGEN Inhale 2 L into the lungs daily.    [provider]  sertraline (ZOLOFT) 100 MG tablet Take 2 tablets (200 mg total) by mouth daily. 03/25/18   Carlean Jews, NP  spironolactone (ALDACTONE) 25 MG tablet Take 25 mg by mouth daily. 10/16/16 10/16/17  [provider]  triamcinolone ointment (KENALOG) 0.5 % Apply 1 application topically 2 (two) times daily. 06/02/18   Albaro Deviney, Kasandra Knudsen, FNP  venlafaxine XR (EFFEXOR-XR) 75 MG 24 hr capsule Take 1 capsule (75 mg total) by mouth daily with breakfast. 03/25/18   Carlean Jews, NP    Allergies Bee venom; Penicillins; and Percocet [oxycodone-acetaminophen]  Family History  Problem Relation Age of Onset  . Hypertension Mother   . Arthritis Mother   . Hypertension Father   . Arthritis Father   . Prostate cancer Father   . Ovarian cancer Maternal Grandmother     Social History Social History   Tobacco Use  . Smoking status: Former Smoker    Packs/day: 1.00    Years: 14.00    Pack years:  14.00    Types: Cigarettes    Last attempt to quit: 05/07/2016    Years since quitting: 2.0  . Smokeless tobacco: Never Used  Substance Use Topics  . Alcohol use: No    Alcohol/week: 0.0 oz  . Drug use: No    Review of Systems  Constitutional: Negative for fever. Respiratory: Negative for cough or shortness of breath.  Musculoskeletal: Negative for myalgias Skin: Positive for rash Neurological: Negative for numbness or paresthesias. ____________________________________________   PHYSICAL EXAM:  VITAL SIGNS: ED Triage Vitals  Enc Vitals Group     BP 06/02/18 1854 (!) 135/97     Pulse Rate 06/02/18 1854 94     Resp 06/02/18 1854 18     Temp 06/02/18 1854 98.6 F (37 C)     Temp  Source 06/02/18 1854 Oral     SpO2 06/02/18 1854 96 %     Weight 06/02/18 1855 245 lb (111.1 kg)     Height 06/02/18 1855 5\' 7"  (1.702 m)     Head Circumference --      Peak Flow --      Pain Score 06/02/18 1855 9     Pain Loc --      Pain Edu? --      Excl. in GC? --      Constitutional: Well appearing. Eyes: Conjunctivae are clear without discharge or drainage. Nose: No rhinorrhea noted. Mouth/Throat: Airway is patent.  Neck: No stridor. Unrestricted range of motion observed. Lymphatic: No tender cervical adenopathy Cardiovascular: Capillary refill is <3 seconds.  Respiratory: Respirations are even and unlabored.. Musculoskeletal: Unrestricted range of motion observed. Neurologic: Awake, alert, and oriented x 4.  Skin:  Vesicular lesions with an erythematous base scattered on the pretibial surface of the left lower extremity. Evidence of tick removal in the area without surrounding erythema or drainage from the site.   ____________________________________________   LABS (all labs ordered are listed, but only abnormal results are displayed)  Labs Reviewed  B. BURGDORFI ANTIBODIES  ROCKY MTN SPOTTED FVR ABS PNL(IGG+IGM)   ____________________________________________  EKG  Not  indicated ____________________________________________  RADIOLOGY  Not indicated. ____________________________________________   PROCEDURES  Procedures ____________________________________________   INITIAL IMPRESSION / ASSESSMENT AND PLAN / ED COURSE  Nicole Wyatt is a 52 y.o. female who presents to the emergency department for evaluation of lesions on the left lower extremity. She is concerned for Lyme's Disease or [facility]. Spotted Fever. Although both are unlikely, testing will be sent prior to discharge. Either way, 2 of the lesions appear to have surrounding cellulitis and she will be placed on doxycycline. She was instructed to follow up with her PCP if not improving over the next few days. She is to return to the ER for symptoms that change or worsen if unable to schedule an appointment.  Medications - No data to display   Pertinent labs & imaging results that were available during my care of the patient were reviewed by me and considered in my medical decision making (see chart for details). ____________________________________________   FINAL CLINICAL IMPRESSION(S) / ED DIAGNOSES  Final diagnoses:  Tick bite, initial encounter  Multiple insect bites    ED Discharge Orders        Ordered    doxycycline (VIBRAMYCIN) 100 MG capsule  2 times daily     06/02/18 1955    triamcinolone ointment (KENALOG) 0.5 %  2 times daily     06/02/18 1955       Note:  This document was prepared using Dragon voice recognition software and may include unintentional dictation errors.    Chinita Pester, FNP 06/02/18 2251    Willy Eddy, MD 06/02/18 [phone]

## 2018-06-02 NOTE — ED Notes (Signed)
Pt had a tick bite 1 month ago to left lower leg.  Pt has a rash now to left foot and lower leg.  Pt alert.

## 2018-06-04 ENCOUNTER — Other Ambulatory Visit: Payer: Self-pay | Admitting: Nurse Practitioner

## 2018-06-04 DIAGNOSIS — R0602 Shortness of breath: Secondary | ICD-10-CM

## 2018-06-04 DIAGNOSIS — R635 Abnormal weight gain: Secondary | ICD-10-CM

## 2018-06-04 DIAGNOSIS — E559 Vitamin D deficiency, unspecified: Secondary | ICD-10-CM

## 2018-06-04 DIAGNOSIS — I272 Pulmonary hypertension, unspecified: Secondary | ICD-10-CM

## 2018-06-04 DIAGNOSIS — E782 Mixed hyperlipidemia: Secondary | ICD-10-CM

## 2018-06-04 DIAGNOSIS — R5383 Other fatigue: Secondary | ICD-10-CM

## 2018-06-04 NOTE — Progress Notes (Signed)
Order for routine fasting labs put into completer.

## 2018-06-05 LAB — LYME, WESTERN BLOT, SERUM (REFLEXED)
IGG P41 AB.: ABSENT
IGG P45 AB.: ABSENT
IGG P66 AB.: ABSENT
IgG P18 Ab.: ABSENT
IgG P23 Ab.: ABSENT
IgG P28 Ab.: ABSENT
IgG P30 Ab.: ABSENT
IgG P39 Ab.: ABSENT
IgG P58 Ab.: ABSENT
IgG P93 Ab.: ABSENT
IgM P39 Ab.: ABSENT
LYME IGG WB: NEGATIVE
LYME IGM WB: POSITIVE — AB

## 2018-06-05 LAB — ROCKY MTN SPOTTED FVR ABS PNL(IGG+IGM)
RMSF IGG: NEGATIVE
RMSF IGM: 1.18 {index} — AB (ref 0.00–0.89)

## 2018-06-05 LAB — B. BURGDORFI ANTIBODIES: B burgdorferi Ab IgG+IgM: 1.28 {ISR} — ABNORMAL HIGH (ref 0.00–0.90)

## 2018-06-23 ENCOUNTER — Ambulatory Visit (INDEPENDENT_AMBULATORY_CARE_PROVIDER_SITE_OTHER): Payer: Medicare Other | Admitting: Nurse Practitioner

## 2018-06-23 ENCOUNTER — Encounter: Payer: Self-pay | Admitting: Nurse Practitioner

## 2018-06-23 VITALS — BP 135/87 | HR 72 | Resp 16 | Ht 66.0 in | Wt 245.2 lb

## 2018-06-23 DIAGNOSIS — R300 Dysuria: Secondary | ICD-10-CM

## 2018-06-23 DIAGNOSIS — Z124 Encounter for screening for malignant neoplasm of cervix: Secondary | ICD-10-CM | POA: Diagnosis not present

## 2018-06-23 DIAGNOSIS — M7072 Other bursitis of hip, left hip: Secondary | ICD-10-CM

## 2018-06-23 DIAGNOSIS — F331 Major depressive disorder, recurrent, moderate: Secondary | ICD-10-CM | POA: Diagnosis not present

## 2018-06-23 DIAGNOSIS — Z0001 Encounter for general adult medical examination with abnormal findings: Secondary | ICD-10-CM | POA: Diagnosis not present

## 2018-06-23 DIAGNOSIS — Z1239 Encounter for other screening for malignant neoplasm of breast: Secondary | ICD-10-CM

## 2018-06-23 DIAGNOSIS — A770 Spotted fever due to Rickettsia rickettsii: Secondary | ICD-10-CM | POA: Diagnosis not present

## 2018-06-23 DIAGNOSIS — Z1231 Encounter for screening mammogram for malignant neoplasm of breast: Secondary | ICD-10-CM | POA: Diagnosis not present

## 2018-06-23 DIAGNOSIS — N390 Urinary tract infection, site not specified: Secondary | ICD-10-CM

## 2018-06-23 MED ORDER — DOXYCYCLINE HYCLATE 100 MG PO CAPS: 100.0000 mg | ORAL_CAPSULE | Freq: Two times a day (BID) | ORAL | 0 refills | Status: DC

## 2018-06-23 MED ORDER — VENLAFAXINE HCL ER 75 MG PO CP24: 75.0000 mg | ORAL_CAPSULE | Freq: Every day | ORAL | 3 refills | Status: DC

## 2018-06-23 MED ORDER — SERTRALINE HCL 100 MG PO TABS
200.0000 mg | ORAL_TABLET | Freq: Every day | ORAL | 3 refills | Status: DC
Start: 2018-06-23 — End: 2018-10-27

## 2018-06-23 MED ORDER — IBUPROFEN 800 MG PO TABS: 800.0000 mg | ORAL_TABLET | Freq: Three times a day (TID) | ORAL | 0 refills | Status: DC | PRN

## 2018-06-23 NOTE — Progress Notes (Signed)
[facility] [address] [place], [place] [address]  Internal MEDICINE  Office Visit Note  Patient Name: Nicole Wyatt  [id number]  [id number]  Date of Service: 07/02/2018   Pt is here for routine health maintenance examination  Chief Complaint  Patient presents with  . Annual Exam  . Osteoarthritis    left hip     The patient has c/o left hip pain. Is currently seeing orthopedics for this. She is to have left hip replacement in the near future. Pain is getting very severe when she stands for longer than 5 minutes or has to put weight on the left leg. A surgery date has not been determined yet.  She is recently seen in [facility] due to tick bite on left lower leg. She subsequently developed rash, fever, chills, and headache. She was treated with doxycycline 100mg  twice daily for 10 days. Blood work indicated she had [facility] Spotted Fever. She has completed her treatment and is feeling mostly better.     Current Medication: Outpatient Encounter Medications as of 06/23/2018  Medication Sig  . Acetaminophen 500 MG coapsule Take 1,000 mg by mouth every 6 (six) hours as needed.  Marland Kitchen albuterol (PROVENTIL HFA;VENTOLIN HFA) 108 (90 Base) MCG/ACT inhaler Inhale 2 puffs into the lungs every 4 (four) hours as needed for wheezing or shortness of breath.   Marland Kitchen albuterol (PROVENTIL) (2.5 MG/3ML) 0.083% nebulizer solution Take 3 mLs (2.5 mg total) by nebulization every 6 (six) hours as needed for wheezing or shortness of breath.  Marland Kitchen aspirin EC 81 MG tablet Take 81 mg by mouth daily.  . clotrimazole-betamethasone (LOTRISONE) cream Apply 1 application topically 2 (two) times daily.  . diazepam (VALIUM) 5 MG tablet Take 1 tablet (5 mg total) by mouth every 8 (eight) hours as needed for muscle spasms.  . Eslicarbazepine Acetate (APTIOM) 800 MG TABS Take 800 mg by mouth daily.  . furosemide (LASIX) 40 MG tablet Take 1 tablet (40 mg total) by mouth daily.  Marland Kitchen ibuprofen (ADVIL,MOTRIN) 800 MG  tablet Take 1 tablet (800 mg total) by mouth every 8 (eight) hours as needed.  . isosorbide mononitrate (IMDUR) 30 MG 24 hr tablet Take 30 mg by mouth daily.  . Lacosamide (VIMPAT) 100 MG TABS Take 1 tablet (100 mg total) by mouth 2 (two) times daily.  Marland Kitchen levETIRAcetam (KEPPRA) 750 MG tablet Take 1,500 mg by mouth 2 (two) times daily.  . mometasone (ASMANEX) 220 MCG/INH inhaler Inhale 2 puffs into the lungs daily.  Marland Kitchen nystatin (NYSTATIN) powder Apply topically 4 (four) times daily.  . OXYGEN Inhale 2 L into the lungs daily.  . sertraline (ZOLOFT) 100 MG tablet Take 2 tablets (200 mg total) by mouth daily.  Marland Kitchen triamcinolone ointment (KENALOG) 0.5 % Apply 1 application topically 2 (two) times daily.  Marland Kitchen venlafaxine XR (EFFEXOR-XR) 75 MG 24 hr capsule Take 1 capsule (75 mg total) by mouth daily with breakfast.  . [DISCONTINUED] ibuprofen (ADVIL,MOTRIN) 800 MG tablet Take 1 tablet (800 mg total) by mouth every 8 (eight) hours as needed.  . [DISCONTINUED] meloxicam (MOBIC) 15 MG tablet Take 15 mg by mouth daily.  . [DISCONTINUED] sertraline (ZOLOFT) 100 MG tablet Take 2 tablets (200 mg total) by mouth daily.  . [DISCONTINUED] venlafaxine XR (EFFEXOR-XR) 75 MG 24 hr capsule Take 1 capsule (75 mg total) by mouth daily with breakfast.  . ciprofloxacin (CIPRO) 500 MG tablet Take 1 tablet (500 mg total) by mouth 2 (two) times daily. (Patient not taking:  Reported on 06/23/2018)  . nitrofurantoin, macrocrystal-monohydrate, (MACROBID) 100 MG capsule Take 1 capsule (100 mg total) by mouth 2 (two) times daily.  Marland Kitchen spironolactone (ALDACTONE) 25 MG tablet Take 25 mg by mouth daily.  . [DISCONTINUED] doxycycline (VIBRAMYCIN) 100 MG capsule Take 1 capsule (100 mg total) by mouth 2 (two) times daily. (Patient not taking: Reported on 06/23/2018)  . [DISCONTINUED] doxycycline (VIBRAMYCIN) 100 MG capsule Take 1 capsule (100 mg total) by mouth 2 (two) times daily.   No facility-administered encounter medications on file as of  06/23/2018.     Surgical History: Past Surgical History:  Procedure Laterality Date  . ABDOMINAL HYSTERECTOMY  1999  . BLADDER SURGERY    . CARDIAC CATHETERIZATION Bilateral 05/29/2016   Procedure: Right/Left Heart Cath and Coronary Angiography;  Surgeon: Lamar Blinks, MD;  Location: [facility] INVASIVE CV LAB;  Service: Cardiovascular;  Laterality: Bilateral;  . COLONOSCOPY    . COLONOSCOPY N/A 07/19/2015   Procedure: COLONOSCOPY;  Surgeon: Earline Mayotte, MD;  Location[facility] ENDOSCOPY;  Service: Endoscopy;  Laterality: N/A;  . FLEXIBLE BRONCHOSCOPY N/A 07/27/2016   Procedure: FLEXIBLE BRONCHOSCOPY;  Surgeon: Yevonne Pax, MD;  Location: [facility] ORS;  Service: Pulmonary;  Laterality: N/A;  . FOOT SURGERY    . TUBAL LIGATION      Medical History: Past Medical History:  Diagnosis Date  . Abnormal Pap smear of cervix   . Anxiety   . Arthritis   . Bipolar 1 disorder (HCC)   . COPD (chronic obstructive pulmonary disease) (HCC)   . Depression   . Oxygen dependent   . Seizures (HCC) 05/2013    Family History: Family History  Problem Relation Age of Onset  . Hypertension Mother   . Arthritis Mother   . Hypertension Father   . Arthritis Father   . Prostate cancer Father   . Ovarian cancer Maternal Grandmother       Review of Systems  Constitutional: Positive for fatigue. Negative for activity change, appetite change, chills and unexpected weight change.  HENT: Negative for congestion, postnasal drip, rhinorrhea, sneezing, sore throat and voice change.   Eyes: Negative.  Negative for redness.  Respiratory: Positive for shortness of breath. Negative for cough, chest tightness and wheezing.   Cardiovascular: Negative for chest pain and palpitations.  Gastrointestinal: Negative for abdominal pain, constipation, diarrhea, nausea and vomiting.  Endocrine: Negative for cold intolerance, heat intolerance, polydipsia, polyphagia and polyuria.  Genitourinary: Negative for dysuria and  frequency.  Musculoskeletal: Positive for arthralgias and myalgias. Negative for back pain, joint swelling and neck pain.       Pain is most severe in left hip  Skin: Positive for rash.  Allergic/Immunologic: Positive for environmental allergies.  Neurological: Positive for weakness and headaches. Negative for dizziness, tremors and numbness.  Hematological: Negative for adenopathy. Does not bruise/bleed easily.  Psychiatric/Behavioral: Positive for dysphoric mood. Negative for behavioral problems (Depression), sleep disturbance and suicidal ideas. The patient is nervous/anxious.      Today's Vitals   06/23/18 1404  BP: 135/87  Pulse: 72  Resp: 16  SpO2: 97%  Weight: 245 lb 3.2 oz (111.2 kg)  Height: 5\' 6"  (1.676 m)    Physical Exam  Constitutional: She is oriented to person, place, and time. She appears well-developed and well-nourished. No distress.  HENT:  Head: Normocephalic and atraumatic.  Nose: Nose normal.  Mouth/Throat: Oropharynx is clear and moist. No oropharyngeal exudate.  Eyes: Pupils are equal, round, and reactive to light. Conjunctivae and EOM are  normal.  Neck: Normal range of motion. Neck supple. No JVD present. Carotid bruit is not present. No tracheal deviation present. No thyromegaly present.  Cardiovascular: Normal rate, regular rhythm, normal heart sounds and intact distal pulses. Exam reveals no gallop and no friction rub.  No murmur heard. Pulmonary/Chest: Effort normal. No respiratory distress. She has no wheezes. She has no rales. She exhibits no tenderness. Right breast exhibits no inverted nipple, no mass, no nipple discharge, no skin change and no tenderness. Left breast exhibits no inverted nipple, no mass, no nipple discharge, no skin change and no tenderness.  Breath sounds are mildly diminished throughout the lung fields.   Abdominal: Soft. Bowel sounds are normal. There is no tenderness. There is no guarding.  Genitourinary: Vagina normal.   Genitourinary Comments: No masses, tenderness, or organomegaly present during bimanual exam.   Musculoskeletal: Normal range of motion.  Moderate left hip pain. Putting weight on the left leg increases pain. ROM and strength are diminished. She does walk with considerable limp, favoring the left side.   Lymphadenopathy:    She has no cervical adenopathy.  Neurological: She is alert and oriented to person, place, and time. No cranial nerve deficit.  Skin: Skin is warm and dry. Capillary refill takes 2 to 3 seconds. She is not diaphoretic.  Psychiatric: Her speech is normal and behavior is normal. Judgment and thought content normal. Cognition and memory are normal. She exhibits a depressed mood.  Nursing note and vitals reviewed.    LABS: Recent Results (from the past 2160 hour(s))  B. burgdorfi antibodies     Status: Abnormal   Collection Time: 06/02/18  8:04 PM  Result Value Ref Range   B burgdorferi Ab IgG+IgM 1.28 (H) 0.00 - 0.90 ISR    Comment: (NOTE)                                Negative         <0.91                                Equivocal  0.91 - 1.09                                Positive         >1.09 Performed At: [facility] [address] [place], [place] [id number] Jolene SchimkeNagendra Sanjai MD WU:[phone] Performed at [facility], [address] [place], [place] [id number]   Rocky mtn spotted fvr abs pnl(IgG+IgM)     Status: Abnormal   Collection Time: 06/02/18  8:04 PM  Result Value Ref Range   RMSF IgG Negative Negative   RMSF IgM 1.18 (H) 0.00 - 0.89 index    Comment: (NOTE)                                 Negative        <0.90                                 Equivocal 0.90 - 1.10  Positive        >1.10 Performed At: [facility] [address] [place], [place] [id number] Jolene Schimke MD AO:[id number] Performed at [facility], [address] [place], [place] [address]   Lyme, Western Blot,  Serum (reflexed)     Status: Abnormal   Collection Time: 06/02/18  8:04 PM  Result Value Ref Range     IgG P93 Ab. Absent      IgG P66 Ab. Absent      IgG P58 Ab. Absent      IgG P45 Ab. Absent      IgG P41 Ab. Absent      IgG P39 Ab. Absent      IgG P30 Ab. Absent      IgG P28 Ab. Absent      IgG P23 Ab. Absent      IgG P18 Ab. Absent    Lyme IgG Wb Negative     Comment: (NOTE)                     Positive: 5 of the following                               Borrelia-specific bands:                               18,23,28,30,39,41,45,58,                               66, and 93.                     Negative: No bands or banding                               patterns which do not                               meet positive criteria.      IgM P41 Ab. Present (A)      IgM P39 Ab. Absent      IgM P23 Ab. Present (A)    Lyme IgM Wb Positive (A)     Comment: (NOTE) Note: An equivocal or positive EIA result followed by a negative Western Blot result is considered NEGATIVE. An equivocal or positive EIA result followed by a positive Western Blot is considered POSITIVE by the CDC. Positive: 2 of the following bands: 23,39 or 41 Negative: No bands or banding patterns which do not meet positive criteria. Criteria for positivity are those recommended by CDC/ASTPHLD. p23=Osp C, p41=flagellin Note: Sera from individuals with the following may cross react in the Lyme Western Blot assays: other spirochetal diseases (periodontal disease, leptospirosis, relapsing fever, yaws, and pinta); connective autoimmune (Rheumatoid Arthritis and Systemic Lupus Erythematosus and also individuals with Antinuclear Antibody); other infections [facility] Spotted Fever; Epstein-Barr Virus, and Cytomegalovirus). Performed At: [facility] [address] [place], [place] [id number] Jolene Schimke MD NU:[id number] Performed at [facility], [address] [place], [place] [address]    UA/M w/rflx Culture, Routine     Status: Abnormal   Collection Time: 06/23/18 12:00 AM  Result Value Ref Range   Specific Gravity, UA 1.017 1.005 -  1.030   pH, UA 7.0 5.0 - 7.5   Color, UA Yellow Yellow   Appearance Ur Cloudy (A) Clear   Leukocytes, UA 2+ (A) Negative   Protein, UA Negative Negative/Trace   Glucose, UA Negative Negative   Ketones, UA Negative Negative   RBC, UA 1+ (A) Negative   Bilirubin, UA Negative Negative   Urobilinogen, Ur 0.2 0.2 - 1.0 mg/dL   Nitrite, UA Positive (A) Negative   Microscopic Examination See below:     Comment: Microscopic was indicated and was performed.   Urinalysis Reflex Comment     Comment: This specimen has reflexed to a Urine Culture.  Microscopic Examination     Status: Abnormal   Collection Time: 06/23/18 12:00 AM  Result Value Ref Range   WBC, UA 11-30 (A) 0 - 5 /hpf   RBC, UA 3-10 (A) 0 - 2 /hpf   Epithelial Cells (non renal) 0-10 0 - 10 /hpf   Casts None seen None seen /lpf   Mucus, UA Present Not Estab.   Bacteria, UA Many (A) None seen/Few  Urine Culture, Reflex     Status: Abnormal   Collection Time: 06/23/18 12:00 AM  Result Value Ref Range   Urine Culture, Routine Final report (A)    Organism ID, Bacteria Proteus mirabilis (A)     Comment: Greater than 100,000 colony forming units per mL Cefazolin <=4 ug/mL Cefazolin with an MIC <=16 predicts susceptibility to the oral agents cefaclor, cefdinir, cefpodoxime, cefprozil, cefuroxime, cephalexin, and loracarbef when used for therapy of uncomplicated urinary tract infections due to E. coli, Klebsiella pneumoniae, and Proteus mirabilis.    ORGANISM ID, BACTERIA Comment (A)     Comment: Escherichia coli, identified by an automated biochemical system. Greater than 100,000 colony forming units per mL    Antimicrobial Susceptibility Comment     Comment:       ** S = Susceptible; I = Intermediate; R = Resistant **                    P = Positive; N = Negative              MICS are expressed in micrograms per mL    Antibiotic                 RSLT#1    RSLT#2    RSLT#3    RSLT#4 Amoxicillin/Clavulanic Acid    S         S Ampicillin                     S         R Cefazolin                                R Cefepime                       S         S Ceftriaxone                    S         S Cefuroxime                     S         S Ciprofloxacin  S         R Ertapenem                      S         S Gentamicin                     S         S Imipenem                                 S Levofloxacin                   S         R Meropenem                      S         S Nitrofurantoin                 R         S Piperacillin/Tazobactam        S         S Tetracycline                   R         R Tobramycin                     S         S Trimethoprim/Sulfa             S         R   Pap IG and HPV (high risk) DNA detection     Status: None   Collection Time: 06/23/18  2:23 PM  Result Value Ref Range   DIAGNOSIS: Comment     Comment: UNSATISFACTORY FOR EVALUATION.   Specimen adequacy: Comment     Comment: Specimen processed and examined, but unsatisfactory for evaluation of epithelial abnormality because of insufficient cellularity.    Clinician Provided ICD10 Comment     Comment: Z12.4   Performed by: Comment     Comment: Filomena Jungling, Cytotechnologist (ASCP)   QC reviewed by: Comment     Comment: Threasa Heads, Cytotechnologist (ASCP)   PAP Smear Comment .    Note: Comment     Comment: The Pap smear is a screening test designed to aid in the detection of premalignant and malignant conditions of the uterine cervix.  It is not a diagnostic procedure and should not be used as the sole means of detecting cervical cancer.  Both false-positive and false-negative reports do occur.    Test Methodology CANCELED     Comment: The Thin Prep(R) Imager was unable to read this specimen.  Therefore a manual review was  performed.  Result canceled by the ancillary.    HPV, high-risk Negative Negative    Comment: This high-risk HPV test detects thirteen high-risk types (16/18/31/33/35/39/45/51/52/56/58/59/68) without differentiation.      Assessment/Plan: 1. Encounter for general adult medical examination with abnormal findings Annual health maintenance exam today. Routine, fasting labs ordered previously. Advised patient to have these drawn when possible.   2. Bursitis of left hip, unspecified bursa Follow up with orthopedics as scheduled to discuss further evaluation and treatment. If surgery recommended, she would be clear with moderate risk due to COPD and history of heart  disease.  - ibuprofen (ADVIL,MOTRIN) 800 MG tablet; Take 1 tablet (800 mg total) by mouth every 8 (eight) hours as needed.  Dispense: 30 tablet; Refill: 0  3. Depression, major, recurrent, moderate (HCC) - venlafaxine XR (EFFEXOR-XR) 75 MG 24 hr capsule; Take 1 capsule (75 mg total) by mouth daily with breakfast.  Dispense: 30 capsule; Refill: 3 - sertraline (ZOLOFT) 100 MG tablet; Take 2 tablets (200 mg total) by mouth daily.  Dispense: 60 tablet; Refill: 3  4. [place] spotted fever Finished antibiotic treatment. Will monitor   5. Urinary tract infection without hematuria, site unspecified - nitrofurantoin, macrocrystal-monohydrate, (MACROBID) 100 MG capsule; Take 1 capsule (100 mg total) by mouth 2 (two) times daily.  Dispense: 20 capsule; Refill: 0  6. Routine cervical smear - Pap IG and HPV (high risk) DNA detection  7. Screening for breast cancer - MM DIGITAL SCREENING BILATERAL; Future  8. Dysuria - UA/M w/rflx Culture, Routine  General Counseling: Deryl verbalizes understanding of the findings of todays visit and agrees with plan of treatment. I have discussed any further diagnostic evaluation that may be needed or ordered today. We also reviewed her medications today. she has been encouraged to call the  office with any questions or concerns that should arise related to todays visit.    Counseling:  This patient was seen by Vincent Gros, FNP- C in Collaboration with Dr Lyndon Code as a part of collaborative care agreement   Orders Placed This Encounter  Procedures  . Microscopic Examination  . Urine Culture, Reflex  . MM DIGITAL SCREENING BILATERAL  . UA/M w/rflx Culture, Routine    Meds ordered this encounter  Medications  . ibuprofen (ADVIL,MOTRIN) 800 MG tablet    Sig: Take 1 tablet (800 mg total) by mouth every 8 (eight) hours as needed.    Dispense:  30 tablet    Refill:  0    Order Specific Question:   Supervising Provider    Answer:   Lyndon Code [[id number]]  . venlafaxine XR (EFFEXOR-XR) 75 MG 24 hr capsule    Sig: Take 1 capsule (75 mg total) by mouth daily with breakfast.    Dispense:  30 capsule    Refill:  3    Order Specific Question:   Supervising Provider    Answer:   Lyndon Code [[id number]]  . sertraline (ZOLOFT) 100 MG tablet    Sig: Take 2 tablets (200 mg total) by mouth daily.    Dispense:  60 tablet    Refill:  3    Order Specific Question:   Supervising Provider    Answer:   Lyndon Code [[id number]]  . DISCONTD: doxycycline (VIBRAMYCIN) 100 MG capsule    Sig: Take 1 capsule (100 mg total) by mouth 2 (two) times daily.    Dispense:  28 capsule    Refill:  0    Order Specific Question:   Supervising Provider    Answer:   Lyndon Code [[id number]]  . nitrofurantoin, macrocrystal-monohydrate, (MACROBID) 100 MG capsule    Sig: Take 1 capsule (100 mg total) by mouth 2 (two) times daily.    Dispense:  20 capsule    Refill:  0    Order Specific Question:   Supervising Provider    Answer:   Lyndon Code [[id number]]    Time spent: 71 Minutes   Lyndon Code, MD  Internal Medicine

## 2018-06-24 ENCOUNTER — Telehealth: Payer: Self-pay

## 2018-06-24 ENCOUNTER — Telehealth: Payer: Self-pay | Admitting: Nurse Practitioner

## 2018-06-24 ENCOUNTER — Other Ambulatory Visit: Payer: Self-pay

## 2018-06-24 DIAGNOSIS — A770 Spotted fever due to Rickettsia rickettsii: Secondary | ICD-10-CM

## 2018-06-24 MED ORDER — NITROFURANTOIN MONOHYD MACRO 100 MG PO CAPS: 100.0000 mg | ORAL_CAPSULE | Freq: Two times a day (BID) | ORAL | 0 refills | Status: DC

## 2018-06-24 MED ORDER — DOXYCYCLINE HYCLATE 100 MG PO CAPS: 100.0000 mg | ORAL_CAPSULE | Freq: Two times a day (BID) | ORAL | 0 refills | Status: DC

## 2018-06-24 NOTE — Telephone Encounter (Signed)
SPOKE WITH PT AND ADVISED HOLD FOR MACROBID FOR NOW AND CONTINUE DOXYCYCLINE AS PER HEATHER

## 2018-06-24 NOTE — Telephone Encounter (Signed)
-----   Message from Carlean JewsHeather E Boscia, NP sent at 06/24/2018  7:51 AM EDT ----- Please let the patient know that urine from her physical did indicate that she had UTI. I ave sent in prescription for macrobid 100mg  twice daily for next 10 days. She should take entire prescription. [facility].

## 2018-06-24 NOTE — Telephone Encounter (Signed)
Pt was advised on results and medication that was called into her pharmacy

## 2018-06-26 LAB — MICROSCOPIC EXAMINATION: CASTS: NONE SEEN /LPF

## 2018-06-26 LAB — PAP IG AND HPV HIGH-RISK
HPV, HIGH-RISK: NEGATIVE
PAP SMEAR COMMENT: 0

## 2018-06-26 LAB — UA/M W/RFLX CULTURE, ROUTINE
BILIRUBIN UA: NEGATIVE
GLUCOSE, UA: NEGATIVE
Ketones, UA: NEGATIVE
NITRITE UA: POSITIVE — AB
PROTEIN UA: NEGATIVE
SPEC GRAV UA: 1.017 (ref 1.005–1.030)
Urobilinogen, Ur: 0.2 mg/dL (ref 0.2–1.0)
pH, UA: 7 (ref 5.0–7.5)

## 2018-06-26 LAB — URINE CULTURE, REFLEX

## 2018-06-30 ENCOUNTER — Telehealth: Payer: Self-pay

## 2018-06-30 NOTE — Telephone Encounter (Signed)
Not yet. It often takes me about a week or so to get these done as I have multiple ones to do.

## 2018-07-01 ENCOUNTER — Telehealth: Payer: Self-pay | Admitting: Nurse Practitioner

## 2018-07-01 NOTE — Telephone Encounter (Signed)
Informed pt that form was not ready yet that we would let her know when its available.

## 2018-07-01 NOTE — Telephone Encounter (Signed)
Patient needs SX clearance for Ortho, Vincent GrosHeather Boscia has formin her folder for review and patient has pulmonary appt for Clear with Dr Lavera GuiseSaadat coming up. Beth

## 2018-07-02 ENCOUNTER — Other Ambulatory Visit: Payer: Self-pay | Admitting: Nurse Practitioner

## 2018-07-02 DIAGNOSIS — R7301 Impaired fasting glucose: Secondary | ICD-10-CM | POA: Diagnosis not present

## 2018-07-02 DIAGNOSIS — A770 Spotted fever due to Rickettsia rickettsii: Secondary | ICD-10-CM | POA: Insufficient documentation

## 2018-07-02 DIAGNOSIS — Z0001 Encounter for general adult medical examination with abnormal findings: Secondary | ICD-10-CM | POA: Diagnosis not present

## 2018-07-02 DIAGNOSIS — E559 Vitamin D deficiency, unspecified: Secondary | ICD-10-CM | POA: Diagnosis not present

## 2018-07-02 DIAGNOSIS — N390 Urinary tract infection, site not specified: Secondary | ICD-10-CM | POA: Insufficient documentation

## 2018-07-02 DIAGNOSIS — E782 Mixed hyperlipidemia: Secondary | ICD-10-CM | POA: Diagnosis not present

## 2018-07-03 LAB — COMPREHENSIVE METABOLIC PANEL
ALBUMIN: 4.2 g/dL (ref 3.5–5.5)
ALK PHOS: 94 IU/L (ref 39–117)
ALT: 12 IU/L (ref 0–32)
AST: 14 IU/L (ref 0–40)
Albumin/Globulin Ratio: 1.3 (ref 1.2–2.2)
BUN / CREAT RATIO: 24 — AB (ref 9–23)
BUN: 14 mg/dL (ref 6–24)
Bilirubin Total: 0.4 mg/dL (ref 0.0–1.2)
CALCIUM: 9.6 mg/dL (ref 8.7–10.2)
CO2: 25 mmol/L (ref 20–29)
Chloride: 105 mmol/L (ref 96–106)
Creatinine, Ser: 0.59 mg/dL (ref 0.57–1.00)
GFR calc Af Amer: 121 mL/min/{1.73_m2} (ref >59)
GFR, EST NON AFRICAN AMERICAN: 105 mL/min/{1.73_m2} (ref >59)
GLOBULIN, TOTAL: 3.2 g/dL (ref 1.5–4.5)
Glucose: 91 mg/dL (ref 65–99)
POTASSIUM: 4.3 mmol/L (ref 3.5–5.2)
SODIUM: 144 mmol/L (ref 134–144)
Total Protein: 7.4 g/dL (ref 6.0–8.5)

## 2018-07-03 LAB — CBC
Hematocrit: 38.8 % (ref 34.0–46.6)
Hemoglobin: 12.9 g/dL (ref 11.1–15.9)
MCH: 30.4 pg (ref 26.6–33.0)
MCHC: 33.2 g/dL (ref 31.5–35.7)
MCV: 92 fL (ref 79–97)
PLATELETS: 289 10*3/uL (ref 150–450)
RBC: 4.24 x10E6/uL (ref 3.77–5.28)
RDW: 16.4 % — AB (ref 12.3–15.4)
WBC: 5.4 10*3/uL (ref 3.4–10.8)

## 2018-07-03 LAB — HGB A1C W/O EAG: Hgb A1c MFr Bld: 5.3 % (ref 4.8–5.6)

## 2018-07-03 LAB — VITAMIN D 25 HYDROXY (VIT D DEFICIENCY, FRACTURES): VIT D 25 HYDROXY: 13.1 ng/mL — AB (ref 30.0–100.0)

## 2018-07-03 LAB — LIPID PANEL W/O CHOL/HDL RATIO
CHOLESTEROL TOTAL: 177 mg/dL (ref 100–199)
HDL: 51 mg/dL (ref >39)
LDL Calculated: 113 mg/dL — ABNORMAL HIGH (ref 0–99)
Triglycerides: 65 mg/dL (ref 0–149)
VLDL CHOLESTEROL CAL: 13 mg/dL (ref 5–40)

## 2018-07-03 LAB — T4, FREE: FREE T4: 1.24 ng/dL (ref 0.82–1.77)

## 2018-07-03 LAB — TSH: TSH: 0.988 u[IU]/mL (ref 0.450–4.500)

## 2018-07-10 ENCOUNTER — Encounter: Payer: Self-pay | Admitting: Internal Medicine

## 2018-07-10 ENCOUNTER — Ambulatory Visit (INDEPENDENT_AMBULATORY_CARE_PROVIDER_SITE_OTHER): Payer: Medicare Other | Admitting: Internal Medicine

## 2018-07-10 ENCOUNTER — Other Ambulatory Visit: Payer: Self-pay | Admitting: Nurse Practitioner

## 2018-07-10 VITALS — BP 130/82 | HR 73 | Resp 16 | Ht 67.0 in | Wt 244.6 lb

## 2018-07-10 DIAGNOSIS — D860 Sarcoidosis of lung: Secondary | ICD-10-CM

## 2018-07-10 DIAGNOSIS — J449 Chronic obstructive pulmonary disease, unspecified: Secondary | ICD-10-CM

## 2018-07-10 DIAGNOSIS — E559 Vitamin D deficiency, unspecified: Secondary | ICD-10-CM

## 2018-07-10 DIAGNOSIS — G4733 Obstructive sleep apnea (adult) (pediatric): Secondary | ICD-10-CM | POA: Diagnosis not present

## 2018-07-10 DIAGNOSIS — R0602 Shortness of breath: Secondary | ICD-10-CM

## 2018-07-10 DIAGNOSIS — M25552 Pain in left hip: Secondary | ICD-10-CM | POA: Diagnosis not present

## 2018-07-10 MED ORDER — ERGOCALCIFEROL 1.25 MG (50000 UT) PO CAPS: 50000.0000 [IU] | ORAL_CAPSULE | ORAL | 5 refills | Status: DC

## 2018-07-10 NOTE — Progress Notes (Addendum)
[facility] [address] [place], [place] [address]  Pulmonary Sleep Medicine   Office Visit Note  Patient Name: Nicole Wyatt DOB: 1966/07/17 MRN [id number]  Date of Service: 07/10/2018 Chief Complaint  Patient presents with  . COPD    surgery clearance   . Medical Clearance   Complaints/HPI:  Patient is planned on having surgery for a hip replacement.  She is here basically for surgery pre-surgical evaluation.  Denies having any chest pain she has no shortness of breath appeared she states she gets occasional shortness of breath when she exerts herself.  The patient had pulmonary functions done about 2 years ago which looks good.  As far as her sleep apnea is concern she is using CPAP.  She gains benefit from the usage of her CPAP she states that she is not having any issues with complications related to CPAP she also does have a history of sarcoidosis and she had a bronchoscopy done in 2017.  She has not required any steroids she has been under control as far as the sarcoidosis is concerned.  ROS  General: (-) fever, (-) chills, (-) night sweats, (-) weakness Skin: (-) rashes, (-) itching,. Eyes: (-) visual changes, (-) redness, (-) itching. Nose and Sinuses: (-) nasal stuffiness or itchiness, (-) postnasal drip, (-) nosebleeds, (-) sinus trouble. Mouth and Throat: (-) sore throat, (-) hoarseness. Neck: (-) swollen glands, (-) enlarged thyroid, (-) neck pain. Respiratory: - cough, (-) bloody sputum, - shortness of breath, - wheezing. Cardiovascular: - ankle swelling, (-) chest pain. Lymphatic: (-) lymph node enlargement. Neurologic: (-) numbness, (-) tingling. Psychiatric: (-) anxiety, (-) depression   Current Medication: Outpatient Encounter Medications as of 07/10/2018  Medication Sig  . Acetaminophen 500 MG coapsule Take 1,000 mg by mouth every 6 (six) hours as needed.  Marland Kitchen albuterol (PROVENTIL HFA;VENTOLIN HFA) 108 (90 Base) MCG/ACT inhaler Inhale 2 puffs  into the lungs every 4 (four) hours as needed for wheezing or shortness of breath.   Marland Kitchen albuterol (PROVENTIL) (2.5 MG/3ML) 0.083% nebulizer solution Take 3 mLs (2.5 mg total) by nebulization every 6 (six) hours as needed for wheezing or shortness of breath.  Marland Kitchen aspirin EC 81 MG tablet Take 81 mg by mouth daily.  . ciprofloxacin (CIPRO) 500 MG tablet Take 1 tablet (500 mg total) by mouth 2 (two) times daily. (Patient not taking: Reported on 06/23/2018)  . clotrimazole-betamethasone (LOTRISONE) cream Apply 1 application topically 2 (two) times daily.  . diazepam (VALIUM) 5 MG tablet Take 1 tablet (5 mg total) by mouth every 8 (eight) hours as needed for muscle spasms.  Marland Kitchen doxycycline (VIBRAMYCIN) 100 MG capsule Take 1 capsule (100 mg total) by mouth 2 (two) times daily.  . ergocalciferol (DRISDOL) 50000 units capsule Take 1 capsule (50,000 Units total) by mouth once a week.  . Eslicarbazepine Acetate (APTIOM) 800 MG TABS Take 800 mg by mouth daily.  . furosemide (LASIX) 40 MG tablet Take 1 tablet (40 mg total) by mouth daily.  Marland Kitchen ibuprofen (ADVIL,MOTRIN) 800 MG tablet Take 1 tablet (800 mg total) by mouth every 8 (eight) hours as needed.  . isosorbide mononitrate (IMDUR) 30 MG 24 hr tablet Take 30 mg by mouth daily.  . Lacosamide (VIMPAT) 100 MG TABS Take 1 tablet (100 mg total) by mouth 2 (two) times daily.  Marland Kitchen levETIRAcetam (KEPPRA) 750 MG tablet Take 1,500 mg by mouth 2 (two) times daily.  . mometasone (ASMANEX) 220 MCG/INH inhaler Inhale 2 puffs into the lungs daily.  Marland Kitchen  nitrofurantoin, macrocrystal-monohydrate, (MACROBID) 100 MG capsule Take 1 capsule (100 mg total) by mouth 2 (two) times daily.  Marland Kitchen nystatin (NYSTATIN) powder Apply topically 4 (four) times daily.  . OXYGEN Inhale 2 L into the lungs daily.  . sertraline (ZOLOFT) 100 MG tablet Take 2 tablets (200 mg total) by mouth daily.  Marland Kitchen spironolactone (ALDACTONE) 25 MG tablet Take 25 mg by mouth daily.  Marland Kitchen triamcinolone ointment (KENALOG) 0.5 % Apply  1 application topically 2 (two) times daily.  Marland Kitchen venlafaxine XR (EFFEXOR-XR) 75 MG 24 hr capsule Take 1 capsule (75 mg total) by mouth daily with breakfast.   No facility-administered encounter medications on file as of 07/10/2018.     Surgical History: Past Surgical History:  Procedure Laterality Date  . ABDOMINAL HYSTERECTOMY  1999  . BLADDER SURGERY    . CARDIAC CATHETERIZATION Bilateral 05/29/2016   Procedure: Right/Left Heart Cath and Coronary Angiography;  Surgeon: Lamar Blinks, MD;  Location: [facility] INVASIVE CV LAB;  Service: Cardiovascular;  Laterality: Bilateral;  . COLONOSCOPY    . COLONOSCOPY N/A 07/19/2015   Procedure: COLONOSCOPY;  Surgeon: Earline Mayotte, MD;  Location: [facility] ENDOSCOPY;  Service: Endoscopy;  Laterality: N/A;  . FLEXIBLE BRONCHOSCOPY N/A 07/27/2016   Procedure: FLEXIBLE BRONCHOSCOPY;  Surgeon: Yevonne Pax, MD;  Location: [facility] ORS;  Service: Pulmonary;  Laterality: N/A;  . FOOT SURGERY    . TUBAL LIGATION      Medical History: Past Medical History:  Diagnosis Date  . Abnormal Pap smear of cervix   . Anxiety   . Arthritis   . Bipolar 1 disorder (HCC)   . COPD (chronic obstructive pulmonary disease) (HCC)   . Depression   . Oxygen dependent   . Seizures (HCC) 05/2013    Family History: Family History  Problem Relation Age of Onset  . Hypertension Mother   . Arthritis Mother   . Hypertension Father   . Arthritis Father   . Prostate cancer Father   . Ovarian cancer Maternal Grandmother     Social History: Social History   Socioeconomic History  . Marital status: Single    Spouse name: Not on file  . Number of children: Not on file  . Years of education: Not on file  . Highest education level: Not on file  Occupational History  . Not on file  Social Needs  . Financial resource strain: Not on file  . Food insecurity:    Worry: Not on file    Inability: Not on file  . Transportation needs:    Medical: Not on file    Non-medical: Not  on file  Tobacco Use  . Smoking status: Former Smoker    Packs/day: 1.00    Years: 14.00    Pack years: 14.00    Types: Cigarettes    Last attempt to quit: 05/07/2016    Years since quitting: 2.1  . Smokeless tobacco: Never Used  Substance and Sexual Activity  . Alcohol use: No    Alcohol/week: 0.0 oz  . Drug use: No  . Sexual activity: Never  Lifestyle  . Physical activity:    Days per week: Not on file    Minutes per session: Not on file  . Stress: Not on file  Relationships  . Social connections:    Talks on phone: Not on file    Gets together: Not on file    Attends religious service: Not on file    Active member of club or organization: Not on  file    Attends meetings of clubs or organizations: Not on file    Relationship status: Not on file  . Intimate partner violence:    Fear of current or ex partner: Not on file    Emotionally abused: Not on file    Physically abused: Not on file    Forced sexual activity: Not on file  Other Topics Concern  . Not on file  Social History Narrative  . Not on file    Vital Signs: Blood pressure 130/82, pulse 73, resp. rate 16, height 5\' 7"  (1.702 m), weight 244 lb 9.6 oz (110.9 kg), SpO2 93 %.  Examination: General Appearance: The patient is well-developed, well-nourished, and in no distress. Skin: Gross inspection of skin unremarkable. Head: normocephalic, no gross deformities. Eyes: no gross deformities noted. ENT: ears appear grossly normal no exudates. Neck: Supple. No thyromegaly. No LAD. Respiratory: no rhonchi noted. Cardiovascular: Normal S1 and S2 without murmur or rub. Extremities: No cyanosis. pulses are equal. Neurologic: Alert and oriented. No involuntary movements.  LABS: Recent Results (from the past 2160 hour(s))  B. burgdorfi antibodies     Status: Abnormal   Collection Time: 06/02/18  8:04 PM  Result Value Ref Range   B burgdorferi Ab IgG+IgM 1.28 (H) 0.00 - 0.90 ISR    Comment: (NOTE)                                 Negative         <0.91                                Equivocal  0.91 - 1.09                                Positive         >1.09 Performed At: [facility] [place] [address] [place], [place] [id number] Jolene SchimkeNagendra Sanjai MD [phone] Performed at [facility], [address] [place], [place] [address]   Rocky mtn spotted fvr abs pnl(IgG+IgM)     Status: Abnormal   Collection Time: 06/02/18  8:04 PM  Result Value Ref Range   RMSF IgG Negative Negative   RMSF IgM 1.18 (H) 0.00 - 0.89 index    Comment: (NOTE)                                 Negative        <0.90                                 Equivocal 0.90 - 1.10                                 Positive        >1.10 Performed At: [facility] [place] [address] [place], [place] [id number] Jolene SchimkeNagendra Sanjai MD [phone] Performed at [facility], [address] [place], [place] [address]   Lyme, Western Blot, Serum (reflexed)     Status: Abnormal   Collection Time: 06/02/18  8:04 PM  Result Value Ref Range     IgG P93 Ab. Absent  IgG P66 Ab. Absent      IgG P58 Ab. Absent      IgG P45 Ab. Absent      IgG P41 Ab. Absent      IgG P39 Ab. Absent      IgG P30 Ab. Absent      IgG P28 Ab. Absent      IgG P23 Ab. Absent      IgG P18 Ab. Absent    Lyme IgG Wb Negative     Comment: (NOTE)                     Positive: 5 of the following                               Borrelia-specific bands:                               18,23,28,30,39,41,45,58,                               66, and 93.                     Negative: No bands or banding                               patterns which do not                               meet positive criteria.      IgM P41 Ab. Present (A)      IgM P39 Ab. Absent      IgM P23 Ab. Present (A)    Lyme IgM Wb Positive (A)     Comment: (NOTE) Note: An equivocal or positive EIA result followed by a negative Western Blot result is considered  NEGATIVE. An equivocal or positive EIA result followed by a positive Western Blot is considered POSITIVE by the CDC. Positive: 2 of the following bands: 23,39 or 41 Negative: No bands or banding patterns which do not meet positive criteria. Criteria for positivity are those recommended by CDC/ASTPHLD. p23=Osp C, p41=flagellin Note: Sera from individuals with the following may cross react in the Lyme Western Blot assays: other spirochetal diseases (periodontal disease, leptospirosis, relapsing fever, yaws, and pinta); connective autoimmune (Rheumatoid Arthritis and Systemic Lupus Erythematosus and also individuals with Antinuclear Antibody); other infections [facility] Spotted Fever; Epstein-Barr Virus, and Cytomegalovirus). Performed At: [facility] [address] [place], [place] [id number] Jolene Schimke MD [id number] Performed at [facility], [address] [place], [place] [address]   UA/M w/rflx Culture, Routine     Status: Abnormal   Collection Time: 06/23/18 12:00 AM  Result Value Ref Range   Specific Gravity, UA 1.017 1.005 - 1.030   pH, UA 7.0 5.0 - 7.5   Color, UA Yellow Yellow   Appearance Ur Cloudy (A) Clear   Leukocytes, UA 2+ (A) Negative   Protein, UA Negative Negative/Trace   Glucose, UA Negative Negative   Ketones, UA Negative Negative   RBC, UA 1+ (A) Negative   Bilirubin, UA Negative Negative   Urobilinogen, Ur 0.2 0.2 - 1.0 mg/dL   Nitrite, UA Positive (A) Negative  Microscopic Examination See below:     Comment: Microscopic was indicated and was performed.   Urinalysis Reflex Comment     Comment: This specimen has reflexed to a Urine Culture.  Microscopic Examination     Status: Abnormal   Collection Time: 06/23/18 12:00 AM  Result Value Ref Range   WBC, UA 11-30 (A) 0 - 5 /hpf   RBC, UA 3-10 (A) 0 - 2 /hpf   Epithelial Cells (non renal) 0-10 0 - 10 /hpf   Casts None seen None seen /lpf   Mucus, UA Present Not Estab.    Bacteria, UA Many (A) None seen/Few  Urine Culture, Reflex     Status: Abnormal   Collection Time: 06/23/18 12:00 AM  Result Value Ref Range   Urine Culture, Routine Final report (A)    Organism ID, Bacteria Proteus mirabilis (A)     Comment: Greater than 100,000 colony forming units per mL Cefazolin <=4 ug/mL Cefazolin with an MIC <=16 predicts susceptibility to the oral agents cefaclor, cefdinir, cefpodoxime, cefprozil, cefuroxime, cephalexin, and loracarbef when used for therapy of uncomplicated urinary tract infections due to E. coli, Klebsiella pneumoniae, and Proteus mirabilis.    ORGANISM ID, BACTERIA Comment (A)     Comment: Escherichia coli, identified by an automated biochemical system. Greater than 100,000 colony forming units per mL    Antimicrobial Susceptibility Comment     Comment:       ** S = Susceptible; I = Intermediate; R = Resistant **                    P = Positive; N = Negative             MICS are expressed in micrograms per mL    Antibiotic                 RSLT#1    RSLT#2    RSLT#3    RSLT#4 Amoxicillin/Clavulanic Acid    S         S Ampicillin                     S         R Cefazolin                                R Cefepime                       S         S Ceftriaxone                    S         S Cefuroxime                     S         S Ciprofloxacin                  S         R Ertapenem                      S         S Gentamicin                     S         S Imipenem  S Levofloxacin                   S         R Meropenem                      S         S Nitrofurantoin                 R         S Piperacillin/Tazobactam        S         S Tetracycline                   R         R Tobramycin                     S         S Trimethoprim/Sulfa             S         R   Pap IG and HPV (high risk) DNA detection     Status: None   Collection Time: 06/23/18  2:23 PM  Result Value Ref Range   DIAGNOSIS:  Comment     Comment: UNSATISFACTORY FOR EVALUATION.   Specimen adequacy: Comment     Comment: Specimen processed and examined, but unsatisfactory for evaluation of epithelial abnormality because of insufficient cellularity.    Clinician Provided ICD10 Comment     Comment: Z12.4   Performed by: Comment     Comment: Filomena Jungling, Cytotechnologist (ASCP)   QC reviewed by: Comment     Comment: Threasa Heads, Cytotechnologist (ASCP)   PAP Smear Comment .    Note: Comment     Comment: The Pap smear is a screening test designed to aid in the detection of premalignant and malignant conditions of the uterine cervix.  It is not a diagnostic procedure and should not be used as the sole means of detecting cervical cancer.  Both false-positive and false-negative reports do occur.    Test Methodology CANCELED     Comment: The Thin Prep(R) Imager was unable to read this specimen.  Therefore a manual review was performed.  Result canceled by the ancillary.    HPV, high-risk Negative Negative    Comment: This high-risk HPV test detects thirteen high-risk types (16/18/31/33/35/39/45/51/52/56/58/59/68) without differentiation.   Comprehensive metabolic panel     Status: Abnormal   Collection Time: 07/02/18  1:09 PM  Result Value Ref Range   Glucose 91 65 - 99 mg/dL   BUN 14 6 - 24 mg/dL   Creatinine, Ser 1.61 0.57 - 1.00 mg/dL   GFR calc non Af Amer 105 >59 mL/min/1.73   GFR calc Af Amer 121 >59 mL/min/1.73   BUN/Creatinine Ratio 24 (H) 9 - 23   Sodium 144 134 - 144 mmol/L   Potassium 4.3 3.5 - 5.2 mmol/L   Chloride 105 96 - 106 mmol/L   CO2 25 20 - 29 mmol/L   Calcium 9.6 8.7 - 10.2 mg/dL   Total Protein 7.4 6.0 - 8.5 g/dL   Albumin 4.2 3.5 - 5.5 g/dL   Globulin, Total 3.2 1.5 - 4.5 g/dL   Albumin/Globulin Ratio 1.3 1.2 - 2.2   Bilirubin Total 0.4 0.0 - 1.2 mg/dL   Alkaline Phosphatase 94 39 - 117 IU/L   AST 14 0 - 40 IU/L  ALT 12 0 - 32 IU/L  CBC     Status: Abnormal    Collection Time: 07/02/18  1:09 PM  Result Value Ref Range   WBC 5.4 3.4 - 10.8 x10E3/uL   RBC 4.24 3.77 - 5.28 x10E6/uL   Hemoglobin 12.9 11.1 - 15.9 g/dL   Hematocrit 41.3 24.4 - 46.6 %   MCV 92 79 - 97 fL   MCH 30.4 26.6 - 33.0 pg   MCHC 33.2 31.5 - 35.7 g/dL   RDW 01.0 (H) 27.2 - 53.6 %   Platelets 289 150 - 450 x10E3/uL  Lipid Panel w/o Chol/HDL Ratio     Status: Abnormal   Collection Time: 07/02/18  1:09 PM  Result Value Ref Range   Cholesterol, Total 177 100 - 199 mg/dL   Triglycerides 65 0 - 149 mg/dL   HDL 51 >64 mg/dL   VLDL Cholesterol Cal 13 5 - 40 mg/dL   LDL Calculated 403 (H) 0 - 99 mg/dL  Hgb K7Q w/o eAG     Status: None   Collection Time: 07/02/18  1:09 PM  Result Value Ref Range   Hgb A1c MFr Bld 5.3 4.8 - 5.6 %    Comment:          Prediabetes: 5.7 - 6.4          Diabetes: >6.4          Glycemic control for adults with diabetes: <7.0   T4, free     Status: None   Collection Time: 07/02/18  1:09 PM  Result Value Ref Range   Free T4 1.24 0.82 - 1.77 ng/dL  TSH     Status: None   Collection Time: 07/02/18  1:09 PM  Result Value Ref Range   TSH 0.988 0.450 - 4.500 uIU/mL  VITAMIN D 25 Hydroxy (Vit-D Deficiency, Fractures)     Status: Abnormal   Collection Time: 07/02/18  1:09 PM  Result Value Ref Range   Vit D, 25-Hydroxy 13.1 (L) 30.0 - 100.0 ng/mL    Comment: Vitamin D deficiency has been defined by the Institute of Medicine and an Endocrine Society practice guideline as a level of serum 25-OH vitamin D less than 20 ng/mL (1,2). The Endocrine Society went on to further define vitamin D insufficiency as a level between 21 and 29 ng/mL (2). 1. IOM (Institute of Medicine). 2010. Dietary reference    intakes for calcium and D. [place]: The    [employer]. 2. Holick MF, Binkley Clarkson, Bischoff-Ferrari HA, et al.    Evaluation, treatment, and prevention of vitamin D    deficiency: an Endocrine Society clinical practice    guideline. JCEM.  2011 Jul; 96(7):1911-30.     Radiology: No results found.  No results found.  No results found.    Assessment and Plan: Patient Active Problem List   Diagnosis Date Noted  . [place] spotted fever 07/02/2018  . Urinary tract infection without hematuria 07/02/2018  . Dysuria 06/23/2018  . Depression, major, recurrent, moderate (HCC) 03/26/2018  . Generalized edema 03/26/2018  . Anaphylactic syndrome 03/26/2018  . Cutaneous candidiasis 03/26/2018  . Bursitis of hip 02/03/2018  . Osteoarthritis of knee 02/03/2018  . Chronic pulmonary hypertension (HCC) 06/18/2016  . S/P cardiac cath 06/18/2016  . Unstable angina (HCC) 05/24/2016  . Bilateral leg edema 05/22/2016  . Shortness of breath 04/30/2016  . Skin lesion of right arm 03/01/2016  . Screening for breast cancer 06/10/2015  . Skin lesion of breast  06/10/2015  . Hx of seizure disorder 06/24/2014  . Left-sided weakness 06/24/2014  . Tobacco abuse 06/24/2014    1. OSA she is compliant with her CPAP device pressure 8CWP  She is tolerating the pressure well.  She is also compliant with her CPAP and is gaining benefit from the use the device CPAP at the chronic pressures.  At this time I do not think she needs to have a follow-up study done because she is under good control.  As far as the surgery is concerned she will need to use the CPAP device after surgery.  She should also avoid heavy sedation and narcotics postoperatively if at all possible. 2. COPD Stable follow up PFT last PFT was in 2017  And as noted above the PFT was actually within normal limits.  She has not had any flare-ups she has not been admitted to the hospital.  I think her issue is more sarcoid and COPD.  She currently does not smoke.  At this time I do not see a need to do a blood gas since she does not have any significant desaturations. 3. Sarcoid no exacerbations noted doing well .  Not on any steroids at this time 4. Morbid obesity at baseline work on  weight loss  General Counseling: I have discussed the findings of the evaluation and examination with [place].  I have also discussed any further diagnostic evaluation thatmay be needed or ordered today. Sonali verbalizes understanding of the findings of todays visit. We also reviewed her medications today and discussed drug interactions and side effects including but not limited excessive drowsiness and altered mental states. We also discussed that there is always a risk not just to her but also people around her. she has been encouraged to call the office with any questions or concerns that should arise related to todays visit.   surgical risk:  Her at risk for intraoperative and postoperative complications from pulmonary disease are mildly increased secondary to her history of sarcoid and also due to her history of sleep apnea.  At this time however she is compensated and so therefore should be safe to proceed with the surgery.  I discuss the potential complications related to anesthesia and general complications that can occur with any anticipated procedure.  She is understanding the risks and benefits of the anticipated hip replacement.  Time spent:  I have personally obtained a history, examined the patient, evaluated laboratory and imaging results, formulated the assessment and plan and placed orders.    Yevonne Pax, MD [facility] Sleep medicine

## 2018-07-10 NOTE — Progress Notes (Signed)
Vitamin d deficiency on labs .added drisdol 50000iu weekly for next 6 months. Other labs were good.

## 2018-07-16 ENCOUNTER — Other Ambulatory Visit: Payer: Self-pay

## 2018-07-16 ENCOUNTER — Emergency Department: Payer: Medicare Other

## 2018-07-16 ENCOUNTER — Emergency Department
Admission: EM | Admit: 2018-07-16 | Discharge: 2018-07-16 | Disposition: A | Discharge status: Home / Self Care | Payer: Medicare Other | Attending: Emergency Medicine | Admitting: Emergency Medicine

## 2018-07-16 DIAGNOSIS — Z7982 Long term (current) use of aspirin: Secondary | ICD-10-CM | POA: Insufficient documentation

## 2018-07-16 DIAGNOSIS — Z79899 Other long term (current) drug therapy: Secondary | ICD-10-CM | POA: Insufficient documentation

## 2018-07-16 DIAGNOSIS — N200 Calculus of kidney: Secondary | ICD-10-CM | POA: Diagnosis not present

## 2018-07-16 DIAGNOSIS — J449 Chronic obstructive pulmonary disease, unspecified: Secondary | ICD-10-CM | POA: Diagnosis not present

## 2018-07-16 DIAGNOSIS — N202 Calculus of kidney with calculus of ureter: Secondary | ICD-10-CM | POA: Diagnosis not present

## 2018-07-16 DIAGNOSIS — Z87891 Personal history of nicotine dependence: Secondary | ICD-10-CM | POA: Insufficient documentation

## 2018-07-16 DIAGNOSIS — R109 Unspecified abdominal pain: Secondary | ICD-10-CM | POA: Diagnosis present

## 2018-07-16 LAB — COMPREHENSIVE METABOLIC PANEL
ALBUMIN: 4.2 g/dL (ref 3.5–5.0)
ALT: 16 U/L (ref 0–44)
AST: 16 U/L (ref 15–41)
Alkaline Phosphatase: 81 U/L (ref 38–126)
Anion gap: 6 (ref 5–15)
BUN: 20 mg/dL (ref 6–20)
CHLORIDE: 108 mmol/L (ref 98–111)
CO2: 27 mmol/L (ref 22–32)
CREATININE: 0.7 mg/dL (ref 0.44–1.00)
Calcium: 8.8 mg/dL — ABNORMAL LOW (ref 8.9–10.3)
GFR calc Af Amer: >60 mL/min (ref >60)
GLUCOSE: 99 mg/dL (ref 70–99)
POTASSIUM: 3.7 mmol/L (ref 3.5–5.1)
Sodium: 141 mmol/L (ref 135–145)
Total Bilirubin: 0.5 mg/dL (ref 0.3–1.2)
Total Protein: 8.2 g/dL — ABNORMAL HIGH (ref 6.5–8.1)

## 2018-07-16 LAB — LIPASE, BLOOD: LIPASE: 31 U/L (ref 11–51)

## 2018-07-16 LAB — URINALYSIS, COMPLETE (UACMP) WITH MICROSCOPIC
BILIRUBIN URINE: NEGATIVE
Bacteria, UA: NONE SEEN
Glucose, UA: NEGATIVE mg/dL
KETONES UR: NEGATIVE mg/dL
LEUKOCYTES UA: NEGATIVE
Nitrite: NEGATIVE
Protein, ur: NEGATIVE mg/dL
RBC / HPF: >50 RBC/hpf — ABNORMAL HIGH (ref 0–5)
SPECIFIC GRAVITY, URINE: 1.016 (ref 1.005–1.030)
pH: 6 (ref 5.0–8.0)

## 2018-07-16 LAB — CBC
HEMATOCRIT: 41.6 % (ref 35.0–47.0)
Hemoglobin: 14.5 g/dL (ref 12.0–16.0)
MCH: 32 pg (ref 26.0–34.0)
MCHC: 34.8 g/dL (ref 32.0–36.0)
MCV: 92 fL (ref 80.0–100.0)
PLATELETS: 308 10*3/uL (ref 150–440)
RBC: 4.53 MIL/uL (ref 3.80–5.20)
RDW: 16.7 % — AB (ref 11.5–14.5)
WBC: 7.5 10*3/uL (ref 3.6–11.0)

## 2018-07-16 MED ORDER — TAMSULOSIN HCL 0.4 MG PO CAPS: 0.4000 mg | ORAL_CAPSULE | Freq: Every day | ORAL | 0 refills | Status: DC

## 2018-07-16 MED ORDER — HYDROCODONE-ACETAMINOPHEN 5-325 MG PO TABS: 1.0000 | ORAL_TABLET | Freq: Four times a day (QID) | ORAL | 0 refills | Status: DC | PRN

## 2018-07-16 MED ORDER — IBUPROFEN 600 MG PO TABS: 600.0000 mg | ORAL_TABLET | Freq: Three times a day (TID) | ORAL | 0 refills | Status: DC | PRN

## 2018-07-16 MED ORDER — KETOROLAC TROMETHAMINE 30 MG/ML IJ SOLN
30.0000 mg | Freq: Once | INTRAMUSCULAR | Status: AC
  Administered 2018-07-16: 30 mg via INTRAMUSCULAR
  Filled 2018-07-16: qty 1

## 2018-07-16 MED ORDER — ONDANSETRON 4 MG PO TBDP
4.0000 mg | ORAL_TABLET | Freq: Once | ORAL | Status: AC
  Administered 2018-07-16: 4 mg via ORAL
  Filled 2018-07-16: qty 1

## 2018-07-16 NOTE — ED Notes (Signed)
Informed RN that patient has been roomed and is ready for evaluation.  Patient in NAD at this time and call bell placed within reach.   

## 2018-07-16 NOTE — ED Triage Notes (Signed)
Pt states a month ago she was treated for UTI. States hasn't felt better since. Denies frequency, hematuria, or dysuria. States she has stones but unsure if kidney or gallstones. C/o back pain and L pelvic pain. Alert, oriented, ambulatory.

## 2018-07-16 NOTE — ED Provider Notes (Signed)
[facility] Emergency Department Provider Note  ____________________________________________   First MD Initiated Contact with Patient 07/16/18 1243     (approximate)  I have reviewed the triage vital signs and the nursing notes.   HISTORY  Chief Complaint Abdominal Pain and Back Pain   HPI Nicole Wyatt is a 52 y.o. female a self presents to the emergency department with left back and left pelvic pain for the past several days.  About a month ago she was treated for urinary tract infection and says she is never really improved however her symptoms became acutely worse over the past 48 hours.  Her pain is sharp stabbing radiating from the left flank to her left groin.  Some nausea no vomiting.  Nothing seems to make it better or worse.  No fevers or chills.  No dysuria frequency hesitancy or hematuria.    Past Medical History:  Diagnosis Date  . Abnormal Pap smear of cervix   . Anxiety   . Arthritis   . Bipolar 1 disorder (HCC)   . COPD (chronic obstructive pulmonary disease) (HCC)   . Depression   . Oxygen dependent   . Seizures (HCC) 05/2013    Patient Active Problem List   Diagnosis Date Noted  . [place] spotted fever 07/02/2018  . Urinary tract infection without hematuria 07/02/2018  . Dysuria 06/23/2018  . Depression, major, recurrent, moderate (HCC) 03/26/2018  . Generalized edema 03/26/2018  . Anaphylactic syndrome 03/26/2018  . Cutaneous candidiasis 03/26/2018  . Bursitis of hip 02/03/2018  . Osteoarthritis of knee 02/03/2018  . Chronic pulmonary hypertension (HCC) 06/18/2016  . S/P cardiac cath 06/18/2016  . Unstable angina (HCC) 05/24/2016  . Bilateral leg edema 05/22/2016  . Shortness of breath 04/30/2016  . Skin lesion of right arm 03/01/2016  . Screening for breast cancer 06/10/2015  . Skin lesion of breast 06/10/2015  . Hx of seizure disorder 06/24/2014  . Left-sided weakness 06/24/2014  . Tobacco abuse 06/24/2014     Past Surgical History:  Procedure Laterality Date  . ABDOMINAL HYSTERECTOMY  1999  . BLADDER SURGERY    . CARDIAC CATHETERIZATION Bilateral 05/29/2016   Procedure: Right/Left Heart Cath and Coronary Angiography;  Surgeon: Lamar Blinks, MD;  Location: [facility] INVASIVE CV LAB;  Service: Cardiovascular;  Laterality: Bilateral;  . COLONOSCOPY    . COLONOSCOPY N/A 07/19/2015   Procedure: COLONOSCOPY;  Surgeon: Earline Mayotte, MD;  Location: [facility] ENDOSCOPY;  Service: Endoscopy;  Laterality: N/A;  . FLEXIBLE BRONCHOSCOPY N/A 07/27/2016   Procedure: FLEXIBLE BRONCHOSCOPY;  Surgeon: Yevonne Pax, MD;  Location: [facility] ORS;  Service: Pulmonary;  Laterality: N/A;  . FOOT SURGERY    . TUBAL LIGATION      Prior to Admission medications   Medication Sig Start Date End Date Taking? Authorizing Provider  Acetaminophen 500 MG coapsule Take 1,000 mg by mouth every 6 (six) hours as needed.    [provider]  albuterol (PROVENTIL HFA;VENTOLIN HFA) 108 (90 Base) MCG/ACT inhaler Inhale 2 puffs into the lungs every 4 (four) hours as needed for wheezing or shortness of breath.     [provider]  albuterol (PROVENTIL) (2.5 MG/3ML) 0.083% nebulizer solution Take 3 mLs (2.5 mg total) by nebulization every 6 (six) hours as needed for wheezing or shortness of breath. 03/25/18   Carlean Jews, NP  aspirin EC 81 MG tablet Take 81 mg by mouth daily.    [provider]  ciprofloxacin (CIPRO) 500 MG tablet  Take 1 tablet (500 mg total) by mouth 2 (two) times daily. Patient not taking: Reported on 06/23/2018 03/06/18   Governor RooksLord, Rebecca, MD  clotrimazole-betamethasone (LOTRISONE) cream Apply 1 application topically 2 (two) times daily. 03/25/18   Carlean JewsBoscia, Heather E, NP  diazepam (VALIUM) 5 MG tablet Take 1 tablet (5 mg total) by mouth every 8 (eight) hours as needed for muscle spasms. 10/07/17   Emily FilbertWilliams, Jonathan E, MD  doxycycline (VIBRAMYCIN) 100 MG capsule Take 1 capsule (100 mg total) by mouth 2  (two) times daily. 06/24/18   Carlean JewsBoscia, Heather E, NP  ergocalciferol (DRISDOL) 50000 units capsule Take 1 capsule (50,000 Units total) by mouth once a week. 07/10/18   Carlean JewsBoscia, Heather E, NP  Eslicarbazepine Acetate (APTIOM) 800 MG TABS Take 800 mg by mouth daily.    [provider]  furosemide (LASIX) 40 MG tablet Take 1 tablet (40 mg total) by mouth daily. 03/25/18   Carlean JewsBoscia, Heather E, NP  HYDROcodone-acetaminophen (NORCO) 5-325 MG tablet Take 1 tablet by mouth every 6 (six) hours as needed for up to 11 doses for severe pain. 07/16/18   Merrily Brittleifenbark, Raylin Winer, MD  ibuprofen (ADVIL,MOTRIN) 600 MG tablet Take 1 tablet (600 mg total) by mouth every 8 (eight) hours as needed. 07/16/18   Merrily Brittleifenbark, Mandi Mattioli, MD  isosorbide mononitrate (IMDUR) 30 MG 24 hr tablet Take 30 mg by mouth daily.    [provider]  Lacosamide (VIMPAT) 100 MG TABS Take 1 tablet (100 mg total) by mouth 2 (two) times daily. 09/25/15   Emily FilbertWilliams, Jonathan E, MD  levETIRAcetam (KEPPRA) 750 MG tablet Take 1,500 mg by mouth 2 (two) times daily.    [provider]  mometasone [facility](ASMANEX) 220 MCG/INH inhaler Inhale 2 puffs into the lungs daily.    [provider]  nitrofurantoin, macrocrystal-monohydrate, (MACROBID) 100 MG capsule Take 1 capsule (100 mg total) by mouth 2 (two) times daily. 06/24/18   Carlean JewsBoscia, Heather E, NP  nystatin (NYSTATIN) powder Apply topically 4 (four) times daily. 03/25/18   Carlean JewsBoscia, Heather E, NP  OXYGEN Inhale 2 L into the lungs daily.    [provider]  sertraline (ZOLOFT) 100 MG tablet Take 2 tablets (200 mg total) by mouth daily. 06/23/18   Carlean JewsBoscia, Heather E, NP  spironolactone (ALDACTONE) 25 MG tablet Take 25 mg by mouth daily. 10/16/16 10/16/17  [provider]  tamsulosin (FLOMAX) 0.4 MG CAPS capsule Take 1 capsule (0.4 mg total) by mouth daily. 07/16/18   Merrily Brittleifenbark, Mizraim Harmening, MD  triamcinolone ointment (KENALOG) 0.5 % Apply 1 application topically 2 (two) times daily. 06/02/18    Triplett, Kasandra Knudsenari B, FNP  venlafaxine XR (EFFEXOR-XR) 75 MG 24 hr capsule Take 1 capsule (75 mg total) by mouth daily with breakfast. 06/23/18   Carlean JewsBoscia, Heather E, NP    Allergies Bee venom; Penicillins; Ciprofloxacin; and Percocet [oxycodone-acetaminophen]  Family History  Problem Relation Age of Onset  . Hypertension Mother   . Arthritis Mother   . Hypertension Father   . Arthritis Father   . Prostate cancer Father   . Ovarian cancer Maternal Grandmother     Social History Social History   Tobacco Use  . Smoking status: Former Smoker    Packs/day: 1.00    Years: 14.00    Pack years: 14.00    Types: Cigarettes    Last attempt to quit: 05/07/2016    Years since quitting: 2.1  . Smokeless tobacco: Never Used  Substance Use Topics  . Alcohol use: No  Alcohol/week: 0.0 oz  . Drug use: No    Review of Systems Constitutional: No fever/chills Eyes: No visual changes. ENT: No sore throat. Cardiovascular: Denies chest pain. Respiratory: Denies shortness of breath. Gastrointestinal: Positive for abdominal pain.  Positive for nausea, no vomiting.  No diarrhea.  No constipation. Genitourinary: Negative for dysuria. Musculoskeletal: Positive for back pain. Skin: Negative for rash. Neurological: Negative for headaches, focal weakness or numbness.   ____________________________________________   PHYSICAL EXAM:  VITAL SIGNS: ED Triage Vitals [07/16/18 1120]  Enc Vitals Group     BP (!) 150/109     Pulse Rate 63     Resp 16     Temp 98 F (36.7 C)     Temp Source Oral     SpO2 96 %     Weight 244 lb (110.7 kg)     Height 5\' 7"  (1.702 m)     Head Circumference      Peak Flow      Pain Score 10     Pain Loc      Pain Edu?      Excl. in GC?     Constitutional: Alert and oriented x4 appears obviously uncomfortable holding her left lower quadrant therefore Eyes: PERRL EOMI. Head: Atraumatic. Nose: No congestion/rhinnorhea. Mouth/Throat: No trismus Neck: No  stridor.   Cardiovascular: Normal rate, regular rhythm. Grossly normal heart sounds.  Good peripheral circulation. Respiratory: Normal respiratory effort.  No retractions. Lungs CTAB and moving good air Gastrointestinal: Soft nondistended nontender no rebound or guarding no peritonitis no costovertebral tenderness Musculoskeletal: No lower extremity edema   Neurologic:  Normal speech and language. No gross focal neurologic deficits are appreciated. Skin:  Skin is warm, dry and intact. No rash noted. Psychiatric: Mood and affect are normal. Speech and behavior are normal.    ____________________________________________   DIFFERENTIAL includes but not limited to  Urinary tract infection, pyelonephritis, nephrolithiasis, bowel obstruction ____________________________________________   LABS (all labs ordered are listed, but only abnormal results are displayed)  Labs Reviewed  COMPREHENSIVE METABOLIC PANEL - Abnormal; Notable for the following components:      Result Value   Calcium 8.8 (*)    Total Protein 8.2 (*)    All other components within normal limits  CBC - Abnormal; Notable for the following components:   RDW 16.7 (*)    All other components within normal limits  URINALYSIS, COMPLETE (UACMP) WITH MICROSCOPIC - Abnormal; Notable for the following components:   Color, Urine YELLOW (*)    APPearance CLEAR (*)    Hgb urine dipstick LARGE (*)    RBC / HPF >50 (*)    All other components within normal limits  LIPASE, BLOOD    Lab work reviewed by me with no evidence of infection but hematuria concerning for stone __________________________________________  EKG   ____________________________________________  RADIOLOGY  CT stone reviewed by me with 6 mm stone at the left UPJ ____________________________________________   PROCEDURES  Procedure(s) performed: no  Procedures  Critical Care performed: no  ____________________________________________   INITIAL  IMPRESSION / ASSESSMENT AND PLAN / ED COURSE  Pertinent labs & imaging results that were available during my care of the patient were reviewed by me and considered in my medical decision making (see chart for details).   As part of my medical decision making, I reviewed the following data within the electronic MEDICAL RECORD NUMBER History obtained from family if available, nursing notes, old chart and ekg, as well as notes from prior  ED visits.  Patient arrives uncomfortable appearing with left flank pain, hematuria, and a history of stones.  IV morphine and Zofran for pain and nausea and a CT scan are pending     ----------------------------------------- 2:03 PM on 07/16/2018 -----------------------------------------  I appreciate the patient's reported allergy to Percocet however she has tolerated Vicodin in the past.  She follows with Dr. Sheppard Penton.  At this point her pain is adequately controlled and I will discharge her with Flomax and hydrocodone as an outpatient.  Strict return precautions have been given and the patient verbalizes understanding and agreement with the plan. ____________________________________________   FINAL CLINICAL IMPRESSION(S) / ED DIAGNOSES  Final diagnoses:  Kidney stone      NEW MEDICATIONS STARTED DURING THIS VISIT:  Discharge Medication List as of 07/16/2018  2:03 PM    START taking these medications   Details  HYDROcodone-acetaminophen (NORCO) 5-325 MG tablet Take 1 tablet by mouth every 6 (six) hours as needed for up to 11 doses for severe pain., Starting Wed 07/16/2018, Print    tamsulosin (FLOMAX) 0.4 MG CAPS capsule Take 1 capsule (0.4 mg total) by mouth daily., Starting Wed 07/16/2018, Normal         Note:  This document was prepared using Dragon voice recognition software and may include unintentional dictation errors.     Merrily Brittle, MD 07/18/18 1422

## 2018-07-16 NOTE — Discharge Instructions (Addendum)
Please make an appointment to follow-up with your urologist Dr. Sheppard PentonWolf within the next week for reevaluation.  Take your pain medication as needed for severe symptoms and make sure you take Flomax every day to help your stone pass.  Return to the emergency department for any concerns.  It was a pleasure to take care of you today, and thank you for coming to our emergency department.  If you have any questions or concerns before leaving please ask the nurse to grab me and I'm more than happy to go through your aftercare instructions again.  If you were prescribed any opioid pain medication today such as Norco, Vicodin, Percocet, morphine, hydrocodone, or oxycodone please make sure you do not drive when you are taking this medication as it can alter your ability to drive safely.  If you have any concerns once you are home that you are not improving or are in fact getting worse before you can make it to your follow-up appointment, please do not hesitate to call 911 and come back for further evaluation.  Merrily BrittleNeil Crislyn Willbanks, MD  Results for orders placed or performed during the hospital encounter of 07/16/18  Lipase, blood  Result Value Ref Range   Lipase 31 11 - 51 U/L  Comprehensive metabolic panel  Result Value Ref Range   Sodium 141 135 - 145 mmol/L   Potassium 3.7 3.5 - 5.1 mmol/L   Chloride 108 98 - 111 mmol/L   CO2 27 22 - 32 mmol/L   Glucose, Bld 99 70 - 99 mg/dL   BUN 20 6 - 20 mg/dL   Creatinine, Ser 1.610.70 0.44 - 1.00 mg/dL   Calcium 8.8 (L) 8.9 - 10.3 mg/dL   Total Protein 8.2 (H) 6.5 - 8.1 g/dL   Albumin 4.2 3.5 - 5.0 g/dL   AST 16 15 - 41 U/L   ALT 16 0 - 44 U/L   Alkaline Phosphatase 81 38 - 126 U/L   Total Bilirubin 0.5 0.3 - 1.2 mg/dL   GFR calc non Af Amer >60 >60 mL/min   GFR calc Af Amer >60 >60 mL/min   Anion gap 6 5 - 15  CBC  Result Value Ref Range   WBC 7.5 3.6 - 11.0 K/uL   RBC 4.53 3.80 - 5.20 MIL/uL   Hemoglobin 14.5 12.0 - 16.0 g/dL   HCT 09.641.6 04.535.0 - 40.947.0 %   MCV  92.0 80.0 - 100.0 fL   MCH 32.0 26.0 - 34.0 pg   MCHC 34.8 32.0 - 36.0 g/dL   RDW 81.116.7 (H) 91.411.5 - 78.214.5 %   Platelets 308 150 - 440 K/uL  Urinalysis, Complete w Microscopic  Result Value Ref Range   Color, Urine YELLOW (A) YELLOW   APPearance CLEAR (A) CLEAR   Specific Gravity, Urine 1.016 1.005 - 1.030   pH 6.0 5.0 - 8.0   Glucose, UA NEGATIVE NEGATIVE mg/dL   Hgb urine dipstick LARGE (A) NEGATIVE   Bilirubin Urine NEGATIVE NEGATIVE   Ketones, ur NEGATIVE NEGATIVE mg/dL   Protein, ur NEGATIVE NEGATIVE mg/dL   Nitrite NEGATIVE NEGATIVE   Leukocytes, UA NEGATIVE NEGATIVE   RBC / HPF >50 (H) 0 - 5 RBC/hpf   WBC, UA 6-10 0 - 5 WBC/hpf   Bacteria, UA NONE SEEN NONE SEEN   Squamous Epithelial / LPF 0-5 0 - 5   Mucus PRESENT    Ct Renal Stone Study  Result Date: 07/16/2018 CLINICAL DATA:  Flank pain.  Stone disease suspected. EXAM: CT  ABDOMEN AND PELVIS WITHOUT CONTRAST TECHNIQUE: Multidetector CT imaging of the abdomen and pelvis was performed following the standard protocol without IV contrast. COMPARISON:  08/19/2017 FINDINGS: Lower chest: Punctate calcified granuloma at the right middle lobe base. Otherwise, the lung bases are clear. No pleural effusions. Hepatobiliary: Normal appearance of the gallbladder and liver. No biliary dilatation. Pancreas: Unremarkable. No pancreatic ductal dilatation or surrounding inflammatory changes. Spleen: Normal in size without focal abnormality. Adrenals/Urinary Tract: Small low-density structure involving the left adrenal gland is most compatible with a small adenoma. Evidence for small lymph node just medial to the left adrenal gland and this appears stable. Stable 2 cm low-density nodule in the right adrenal gland is compatible with a benign adenoma. Mild dilatation of the left renal pelvis. There is a 6 mm stone in the left renal pelvis near the left UVJ. No other definite left renal stones. A tiny right renal stone which is nonobstructive. Stomach/Bowel:  Stomach is within normal limits. Appendix appears normal. No evidence of bowel wall thickening, distention, or inflammatory changes. Vascular/Lymphatic: Again noted are multiple small lymph nodes in the upper abdominal retroperitoneum and these are similar to the prior examination. Mild stranding in the left periaortic region. No significant lymph node enlargement. Vascular structures are unremarkable. Reproductive: Status post hysterectomy. No adnexal masses. Left ovary is slightly prominent for size measuring up to 3.9 cm but unchanged from the prior examination. Right ovary is not well demonstrated. There is a stable phlebolith in the right ovarian vein. Other: Trace fluid in the pelvis which may be physiologic. Negative for free air. Musculoskeletal: Extensive facet arthropathy in the lower lumbar spine. Prominent subchondral cysts involving the acetabula. Both hips are located. There is severe joint space narrowing involving the superior left hip joint. Subchondral cyst formations in the left femoral head. IMPRESSION: 6 mm stone at the left ureteropelvic junction with mild dilatation of the left renal pelvis. Tiny nonobstructive right kidney stone. Trace free fluid in the pelvis.  This fluid may be physiologic. Osteoarthritis in both hips, left side worse than right. Benign adrenal adenomas. Electronically Signed   By: Richarda Overlie M.D.   On: 07/16/2018 13:38

## 2018-07-17 ENCOUNTER — Telehealth: Payer: Self-pay

## 2018-07-17 NOTE — Telephone Encounter (Signed)
[facility]

## 2018-07-25 ENCOUNTER — Other Ambulatory Visit: Payer: Self-pay

## 2018-07-25 DIAGNOSIS — B372 Candidiasis of skin and nail: Secondary | ICD-10-CM

## 2018-07-25 MED ORDER — CLOTRIMAZOLE-BETAMETHASONE 1-0.05 % EX CREA: 1.0000 "application " | TOPICAL_CREAM | Freq: Two times a day (BID) | CUTANEOUS | 3 refills | Status: DC

## 2018-07-28 ENCOUNTER — Other Ambulatory Visit: Payer: Self-pay | Admitting: Nurse Practitioner

## 2018-07-28 ENCOUNTER — Other Ambulatory Visit: Payer: Self-pay

## 2018-07-28 MED ORDER — MELOXICAM 15 MG PO TABS: 15.0000 mg | ORAL_TABLET | Freq: Every day | ORAL | 3 refills | Status: DC

## 2018-07-30 ENCOUNTER — Ambulatory Visit: Payer: Self-pay | Admitting: Internal Medicine

## 2018-08-14 ENCOUNTER — Ambulatory Visit: Payer: Self-pay | Admitting: Internal Medicine

## 2018-08-20 ENCOUNTER — Ambulatory Visit (INDEPENDENT_AMBULATORY_CARE_PROVIDER_SITE_OTHER): Payer: Medicare Other

## 2018-08-20 DIAGNOSIS — G4733 Obstructive sleep apnea (adult) (pediatric): Secondary | ICD-10-CM | POA: Diagnosis not present

## 2018-08-20 NOTE — Progress Notes (Signed)
95 percentile pressure 10.1   95th percentile leak 25.6   apnea index 0.6 /hr  apnea-hypopnea index  0.8 /hr   total days used  >4 hr 87 days  total days used <4 hr 0 days  Total compliance 97 percent  She is doing great on cpap no problems or questions at this time.

## 2018-08-21 ENCOUNTER — Ambulatory Visit: Payer: Self-pay | Admitting: Internal Medicine

## 2018-08-27 DIAGNOSIS — M25552 Pain in left hip: Secondary | ICD-10-CM | POA: Diagnosis not present

## 2018-09-03 ENCOUNTER — Ambulatory Visit: Payer: Self-pay | Admitting: Internal Medicine

## 2018-09-08 ENCOUNTER — Ambulatory Visit: Payer: Self-pay | Admitting: Internal Medicine

## 2018-09-15 DIAGNOSIS — Z6837 Body mass index (BMI) 37.0-37.9, adult: Secondary | ICD-10-CM | POA: Diagnosis not present

## 2018-09-15 DIAGNOSIS — M1612 Unilateral primary osteoarthritis, left hip: Secondary | ICD-10-CM | POA: Diagnosis not present

## 2018-09-15 NOTE — H&P (Signed)
HIP ARTHROPLASTY ADMISSION H&P  Patient ID: Nicole Wyatt MRN: [id number] DOB/AGE: Apr 19, 1966 52 y.o.  Chief Complaint: left hip pain.  Planned Procedure Date: 09/30/18 Medical Clearance by Hermenia FiscalBoscia, NP Additional clearance by Pulm: Dr. Welton FlakesKhan.  Cards: Dr. Gwen PoundsKowalski Neurology: Dr. Sherryll BurgerShah pending.  HPI: Nicole Wyatt is a [age] y.o. female with a history of OSA, COPD, Depression, and seizures (not on medications) who presents for evaluation of OA LEFT HIP. The patient has a history of pain and functional disability in the left hip due to arthritis and has failed non-surgical conservative treatments for greater than 12 weeks to include NSAID's and/or analgesics, corticosteriod injections and activity modification.  Onset of symptoms was gradual, starting 4 years ago with gradually worsening course since that time. The patient noted no past surgery on the left hip.  Patient currently rates pain at 9 out of 10 with activity. Patient has night pain, worsening of pain with activity and weight bearing, pain that interferes with activities of daily living and pain with passive range of motion.  Patient has evidence of subchondral cysts, subchondral sclerosis, periarticular osteophytes and joint space narrowing by imaging studies.  There is no active infection.  Past Medical History:  Diagnosis Date  . Abnormal Pap smear of cervix   . Anxiety   . Arthritis   . Bipolar 1 disorder (HCC)   . COPD (chronic obstructive pulmonary disease) (HCC)   . Depression   . Oxygen dependent   . Seizures (HCC) 05/2013   Past Surgical History:  Procedure Laterality Date  . ABDOMINAL HYSTERECTOMY  1999  . BLADDER SURGERY    . CARDIAC CATHETERIZATION Bilateral 05/29/2016   Procedure: Right/Left Heart Cath and Coronary Angiography;  Surgeon: Lamar BlinksBruce J Kowalski, MD;  Location: [facility] INVASIVE CV LAB;  Service: Cardiovascular;  Laterality: Bilateral;  . COLONOSCOPY    . COLONOSCOPY N/A 07/19/2015   Procedure: COLONOSCOPY;   Surgeon: Earline MayotteJeffrey W Byrnett, MD;  Location[facility] ENDOSCOPY;  Service: Endoscopy;  Laterality: N/A;  . FLEXIBLE BRONCHOSCOPY N/A 07/27/2016   Procedure: FLEXIBLE BRONCHOSCOPY;  Surgeon: Yevonne PaxSaadat A Khan, MD;  Location: [facility] ORS;  Service: Pulmonary;  Laterality: N/A;  . FOOT SURGERY    . TUBAL LIGATION     Allergies: PCN: Patient reports Rash only.  No anaphylaxis.  No face swelling or SOB. Percocet: Patient reports Nausea when taken without food only.  No anaphylaxis.  No face swelling or SOB.  Medications: Sertraline HCL 100 mg daily  Venlafaxine HCL 75 mg daily Meloxicam 15 mg daily prn   Social History: Single, disabled former smoker (quit May 2019).  No Etoh.  Family History  Problem Relation Age of Onset  . Hypertension Mother   . Arthritis Mother   . Hypertension Father   . Arthritis Father   . Prostate cancer Father   . Ovarian cancer Maternal Grandmother     ROS: Currently denies lightheadedness, dizziness, Fever, chills, CP, SOB.   No personal history of DVT, PE, MI, or CVA. No loose teeth or dentures All other systems have been reviewed and were otherwise currently negative with the exception of those mentioned in the HPI and as above.  Objective: Vitals: Ht: 5'8"  Wt: 249Temp: 98.0 BP: 128/83 Pulse: 84 O2 94% on room air.   Physical Exam: General: Alert, NAD. Trendelenberg Gait  HEENT: EOMI, Good Neck Extension  Pulm: No increased work of breathing.  Clear B/L A/P w/o crackle or wheeze.  CV: RRR, No m/g/r appreciated  GI: soft, NT, ND  Neuro: Neuro without gross focal deficit.  Sensation intact distally Skin: No lesions in the area of chief complaint MSK/Surgical Site: Left Hip pain with passive ROM.  Positive Stinchfield.  5/5 strength.  NVI.  Sensation intact distally.  Imaging Review Plain radiographs demonstrate severe degenerative joint disease of the left hip.   Preoperative templating of the joint replacement has been completed, documented, and submitted to  the Operating Room personnel in order to optimize intra-operative equipment management.  Assessment: OA LEFT HIP Active Problems:   Hx of seizure disorder   Depression, major, recurrent, moderate (HCC)   Plan: Plan for Procedure(s): LEFT TOTAL HIP ARTHROPLASTY ANTERIOR APPROACH  The patient history, physical exam, clinical judgement of the provider and imaging are consistent with end stage degenerative joint disease and total joint arthroplasty is deemed medically necessary. The treatment options including medical management, injection therapy, and arthroplasty were discussed at length. The risks and benefits of Procedure(s): LEFT TOTAL HIP ARTHROPLASTY ANTERIOR APPROACH were presented and reviewed.  The risks of nonoperative treatment, versus surgical intervention including but not limited to continued pain, aseptic loosening, stiffness, dislocation/subluxation, infection, bleeding, nerve injury, blood clots, cardiopulmonary complications, morbidity, mortality, among others were discussed. The patient verbalizes understanding and wishes to proceed with the plan.  Patient is being admitted for inpatient treatment for surgery, pain control, PT, OT, prophylactic antibiotics, VTE prophylaxis, progressive ambulation, ADL's and discharge planning.   Dental prophylaxis discussed and recommended for 2 years postoperatively.   The patient does meet the criteria for TXA which will be used perioperatively.    ASA 81 mg BID will be used postoperatively for DVT prophylaxis in addition to SCDs, and early ambulation.  Plan for Norco or oxycodone, Gabapentin, Celebrex for pain postoperatively.  Order CPAP at bedtime for OSA.  The patient is planning to be discharged home with home health services ([facility]) in care of her foster sister, daughter and son in Social worker.   Albina Billet III, PA-C 09/15/2018 4:12 PM

## 2018-09-18 NOTE — Progress Notes (Signed)
LOV/Cardiac clearance Dr Gwen Pounds 01-29-18 on chart   LOV/pulm clearance Dr Carolynne Edouard 07-17-18 on chart   LOV/medical clearance Vincent Gros, NP 07-02-18 on chart

## 2018-09-18 NOTE — Patient Instructions (Addendum)
Nicole Wyatt  09/18/2018   Your procedure is scheduled on: 09-30-18   Report to [facility] Main  Entrance    Report to admitting at 7:30AM    Call this number if you have problems the morning of surgery [phone]   PLEASE BRING CPAP MASK AND  TUBING ONLY. DEVICE WILL BE PROVIDED!    Remember: Do not eat food or drink liquids :After Midnight. BRUSH YOUR TEETH MORNING OF SURGERY AND RINSE YOUR MOUTH OUT, NO CHEWING GUM CANDY OR MINTS.     Take these medicines the morning of surgery with A SIP OF WATER: venlafaxine, isosorbide(Imdur), tylenol if needed, Asmanex inhaler, albuterol inhaler and nebulizer if needed, Dulera inhaler if needed                                You may not have any metal on your body including hair pins and              piercings  Do not wear jewelry, make-up, lotions, powders or perfumes, deodorant             Do not wear nail polish.  Do not shave  48 hours prior to surgery.             Do not bring valuables to the hospital. [facility] IS NOT             RESPONSIBLE   FOR VALUABLES.  Contacts, dentures or bridgework may not be worn into surgery.  Leave suitcase in the car. After surgery it may be brought to your room.                  Please read over the following fact sheets you were given: _____________________________________________________________________             [facility] - Preparing for Surgery Before surgery, you can play an important role.  Because skin is not sterile, your skin needs to be as free of germs as possible.  You can reduce the number of germs on your skin by washing with CHG (chlorahexidine gluconate) soap before surgery.  CHG is an antiseptic cleaner which kills germs and bonds with the skin to continue killing germs even after washing. Please DO NOT use if you have an allergy to CHG or antibacterial soaps.  If your skin becomes reddened/irritated stop using the CHG and inform your nurse  when you arrive at Short Stay. Do not shave (including legs and underarms) for at least 48 hours prior to the first CHG shower.  You may shave your face/neck. Please follow these instructions carefully:  1.  Shower with CHG Soap the night before surgery and the  morning of Surgery.  2.  If you choose to wash your hair, wash your hair first as usual with your  normal  shampoo.  3.  After you shampoo, rinse your hair and body thoroughly to remove the  shampoo.                           4.  Use CHG as you would any other liquid soap.  You can apply chg directly  to the skin and wash  Gently with a scrungie or clean washcloth.  5.  Apply the CHG Soap to your body ONLY FROM THE NECK DOWN.   Do not use on face/ open                           Wound or open sores. Avoid contact with eyes, ears mouth and genitals (private parts).                       Wash face,  Genitals (private parts) with your normal soap.             6.  Wash thoroughly, paying special attention to the area where your surgery  will be performed.  7.  Thoroughly rinse your body with warm water from the neck down.  8.  DO NOT shower/wash with your normal soap after using and rinsing off  the CHG Soap.                9.  Pat yourself dry with a clean towel.            10.  Wear clean pajamas.            11.  Place clean sheets on your bed the night of your first shower and do not  sleep with pets. Day of Surgery : Do not apply any lotions/deodorants the morning of surgery.  Please wear clean clothes to the hospital/surgery center.  FAILURE TO FOLLOW THESE INSTRUCTIONS MAY RESULT IN THE CANCELLATION OF YOUR SURGERY PATIENT SIGNATURE_________________________________  NURSE SIGNATURE__________________________________  ________________________________________________________________________   [name]  An incentive spirometer is a tool that can help keep your lungs clear and active. This tool  measures how well you are filling your lungs with each breath. Taking long deep breaths may help reverse or decrease the chance of developing breathing (pulmonary) problems (especially infection) following:  A long period of time when you are unable to move or be active. BEFORE THE PROCEDURE   If the spirometer includes an indicator to show your best effort, your nurse or respiratory therapist will set it to a desired goal.  If possible, sit up straight or lean slightly forward. Try not to slouch.  Hold the incentive spirometer in an upright position. INSTRUCTIONS FOR USE  1. Sit on the edge of your bed if possible, or sit up as far as you can in bed or on a chair. 2. Hold the incentive spirometer in an upright position. 3. Breathe out normally. 4. Place the mouthpiece in your mouth and seal your lips tightly around it. 5. Breathe in slowly and as deeply as possible, raising the piston or the ball toward the top of the column. 6. Hold your breath for 3-5 seconds or for as long as possible. Allow the piston or ball to fall to the bottom of the column. 7. Remove the mouthpiece from your mouth and breathe out normally. 8. Rest for a few seconds and repeat Steps 1 through 7 at least 10 times every 1-2 hours when you are awake. Take your time and take a few normal breaths between deep breaths. 9. The spirometer may include an indicator to show your best effort. Use the indicator as a goal to work toward during each repetition. 10. After each set of 10 deep breaths, practice coughing to be sure your lungs are clear. If you have an incision (the cut made at the time of surgery),  support your incision when coughing by placing a pillow or rolled up towels firmly against it. Once you are able to get out of bed, walk around indoors and cough well. You may stop using the incentive spirometer when instructed by your caregiver.  RISKS AND COMPLICATIONS  Take your time so you do not get dizzy or  light-headed.  If you are in pain, you may need to take or ask for pain medication before doing incentive spirometry. It is harder to take a deep breath if you are having pain. AFTER USE  Rest and breathe slowly and easily.  It can be helpful to keep track of a log of your progress. Your caregiver can provide you with a simple table to help with this. If you are using the spirometer at home, follow these instructions: [place] IF:   You are having difficultly using the spirometer.  You have trouble using the spirometer as often as instructed.  Your pain medication is not giving enough relief while using the spirometer.  You develop fever of 100.5 F (38.1 C) or higher. SEEK IMMEDIATE MEDICAL CARE IF:   You cough up bloody sputum that had not been present before.  You develop fever of 102 F (38.9 C) or greater.  You develop worsening pain at or near the incision site. MAKE SURE YOU:   Understand these instructions.  Will watch your condition.  Will get help right away if you are not doing well or get worse. Document Released: 04/22/2007 Document Revised: 03/03/2012 Document Reviewed: 06/23/2007 [facility] Patient Information 2014 [place], [place].   ________________________________________________________________________

## 2018-09-23 ENCOUNTER — Encounter (HOSPITAL_COMMUNITY): Payer: Self-pay

## 2018-09-23 ENCOUNTER — Encounter (HOSPITAL_COMMUNITY)
Admission: RE | Admit: 2018-09-23 | Discharge: 2018-09-23 | Disposition: A | Discharge status: Home / Self Care | Payer: Medicare Other | Source: Ambulatory Visit | Attending: Orthopedic Surgery | Admitting: Orthopedic Surgery

## 2018-09-23 ENCOUNTER — Other Ambulatory Visit: Payer: Self-pay

## 2018-09-23 DIAGNOSIS — Z01812 Encounter for preprocedural laboratory examination: Secondary | ICD-10-CM | POA: Diagnosis not present

## 2018-09-23 DIAGNOSIS — I4510 Unspecified right bundle-branch block: Secondary | ICD-10-CM | POA: Diagnosis not present

## 2018-09-23 DIAGNOSIS — Z01810 Encounter for preprocedural cardiovascular examination: Secondary | ICD-10-CM | POA: Diagnosis not present

## 2018-09-23 DIAGNOSIS — R001 Bradycardia, unspecified: Secondary | ICD-10-CM | POA: Insufficient documentation

## 2018-09-23 HISTORY — DX: Spotted fever due to Rickettsia rickettsii: A77.0

## 2018-09-23 HISTORY — DX: Calculus of kidney: N20.0

## 2018-09-23 HISTORY — DX: Sarcoidosis of lung: D86.0

## 2018-09-23 HISTORY — DX: Dyspnea, unspecified: R06.00

## 2018-09-23 HISTORY — DX: Sleep apnea, unspecified: G47.30

## 2018-09-23 LAB — CBC
HCT: 38.9 % (ref 36.0–46.0)
Hemoglobin: 12.7 g/dL (ref 12.0–15.0)
MCH: 30.5 pg (ref 26.0–34.0)
MCHC: 32.6 g/dL (ref 30.0–36.0)
MCV: 93.5 fL (ref 78.0–100.0)
PLATELETS: 251 10*3/uL (ref 150–400)
RBC: 4.16 MIL/uL (ref 3.87–5.11)
RDW: 14.3 % (ref 11.5–15.5)
WBC: 5.1 10*3/uL (ref 4.0–10.5)

## 2018-09-23 LAB — BASIC METABOLIC PANEL
Anion gap: 5 (ref 5–15)
BUN: 18 mg/dL (ref 6–20)
CHLORIDE: 108 mmol/L (ref 98–111)
CO2: 28 mmol/L (ref 22–32)
CREATININE: 0.67 mg/dL (ref 0.44–1.00)
Calcium: 9 mg/dL (ref 8.9–10.3)
Glucose, Bld: 90 mg/dL (ref 70–99)
Potassium: 4.4 mmol/L (ref 3.5–5.1)
Sodium: 141 mmol/L (ref 135–145)

## 2018-09-23 LAB — SURGICAL PCR SCREEN
MRSA, PCR: NEGATIVE
STAPHYLOCOCCUS AUREUS: NEGATIVE

## 2018-09-25 ENCOUNTER — Other Ambulatory Visit: Payer: Self-pay | Admitting: Orthopedic Surgery

## 2018-09-25 ENCOUNTER — Encounter (HOSPITAL_COMMUNITY): Payer: Self-pay | Admitting: Physician Assistant

## 2018-09-25 NOTE — Care Plan (Signed)
Spoke with patient prior to surgery. She will discharge to home with family and HHPT. At this point no OPPT is set up. Will discuss at MD follow up visit

## 2018-09-26 ENCOUNTER — Other Ambulatory Visit: Payer: Self-pay

## 2018-09-26 DIAGNOSIS — F331 Major depressive disorder, recurrent, moderate: Secondary | ICD-10-CM

## 2018-09-26 MED ORDER — VENLAFAXINE HCL ER 75 MG PO CP24: 75.0000 mg | ORAL_CAPSULE | Freq: Every day | ORAL | 3 refills | Status: DC

## 2018-09-30 ENCOUNTER — Inpatient Hospital Stay (HOSPITAL_COMMUNITY): Admission: RE | Admit: 2018-09-30 | Payer: Medicare Other | Source: Ambulatory Visit | Admitting: Orthopedic Surgery

## 2018-09-30 ENCOUNTER — Encounter (HOSPITAL_COMMUNITY): Admission: RE | Payer: Self-pay | Source: Ambulatory Visit

## 2018-09-30 SURGERY — ARTHROPLASTY, HIP, TOTAL, ANTERIOR APPROACH
Anesthesia: Choice | Site: Hip | Laterality: Left

## 2018-10-08 DIAGNOSIS — Z8669 Personal history of other diseases of the nervous system and sense organs: Secondary | ICD-10-CM | POA: Diagnosis not present

## 2018-10-13 DIAGNOSIS — M1612 Unilateral primary osteoarthritis, left hip: Secondary | ICD-10-CM | POA: Diagnosis present

## 2018-10-13 DIAGNOSIS — G4733 Obstructive sleep apnea (adult) (pediatric): Secondary | ICD-10-CM | POA: Diagnosis present

## 2018-10-13 DIAGNOSIS — R569 Unspecified convulsions: Secondary | ICD-10-CM | POA: Diagnosis present

## 2018-10-13 DIAGNOSIS — F445 Conversion disorder with seizures or convulsions: Secondary | ICD-10-CM | POA: Diagnosis present

## 2018-10-13 NOTE — H&P (Signed)
Addendum: Neurology Clearance obtained 10/09/18 by Dr. Delphina Cahill III, PA-C 10/13/2018 3:32 PM   HIP ARTHROPLASTY ADMISSION H&P  Patient ID: [name] MRN: [id number] DOB/AGE: 1966-09-30 52 y.o.  Chief Complaint: left hip pain.  Planned Procedure Date: 09/30/18 Medical Clearance by Hermenia Fiscal, NP Additional clearance by Pulm: Dr. Welton Flakes.  Cards: Dr. Gwen Pounds Neurology: Dr. Sherryll Burger pending.  HPI: Nicole Wyatt is a 52 y.o. female with a history of OSA, COPD, Depression, and seizures (not on medications) who presents for evaluation of OA LEFT HIP. The patient has a history of pain and functional disability in the left hip due to arthritis and has failed non-surgical conservative treatments for greater than 12 weeks to include NSAID's and/or analgesics, corticosteriod injections and activity modification.  Onset of symptoms was gradual, starting 4 years ago with gradually worsening course since that time. The patient noted no past surgery on the left hip.  Patient currently rates pain at 9 out of 10 with activity. Patient has night pain, worsening of pain with activity and weight bearing, pain that interferes with activities of daily living and pain with passive range of motion.  Patient has evidence of subchondral cysts, subchondral sclerosis, periarticular osteophytes and joint space narrowing by imaging studies.  There is no active infection.      Past Medical History:  Diagnosis Date  . Abnormal Pap smear of cervix   . Anxiety   . Arthritis   . Bipolar 1 disorder (HCC)   . COPD (chronic obstructive pulmonary disease) (HCC)   . Depression   . Oxygen dependent   . Seizures (HCC) 05/2013        Past Surgical History:  Procedure Laterality Date  . ABDOMINAL HYSTERECTOMY  1999  . BLADDER SURGERY    . CARDIAC CATHETERIZATION Bilateral 05/29/2016   Procedure: Right/Left Heart Cath and Coronary Angiography;  Surgeon: Lamar Blinks, MD;   Location: [facility] INVASIVE CV LAB;  Service: Cardiovascular;  Laterality: Bilateral;  . COLONOSCOPY    . COLONOSCOPY N/A 07/19/2015   Procedure: COLONOSCOPY;  Surgeon: Earline Mayotte, MD;  Location: [facility] ENDOSCOPY;  Service: Endoscopy;  Laterality: N/A;  . FLEXIBLE BRONCHOSCOPY N/A 07/27/2016   Procedure: FLEXIBLE BRONCHOSCOPY;  Surgeon: Yevonne Pax, MD;  Location: [facility] ORS;  Service: Pulmonary;  Laterality: N/A;  . FOOT SURGERY    . TUBAL LIGATION     Allergies: PCN: Patient reports Rash only.  No anaphylaxis.  No face swelling or SOB. Percocet: Patient reports Nausea when taken without food only.  No anaphylaxis.  No face swelling or SOB.  Medications: Sertraline HCL 100 mg daily    Venlafaxine HCL 75 mg daily Meloxicam 15 mg daily prn   Social History: Single, disabled former smoker (quit May 2019).  No Etoh.       Family History  Problem Relation Age of Onset  . Hypertension Mother   . Arthritis Mother   . Hypertension Father   . Arthritis Father   . Prostate cancer Father   . Ovarian cancer Maternal Grandmother     ROS: Currently denies lightheadedness, dizziness, Fever, chills, CP, SOB.   No personal history of DVT, PE, MI, or CVA. No loose teeth or dentures All other systems have been reviewed and were otherwise currently negative with the exception of those mentioned in the HPI and as above.  Objective: Vitals: Ht: 5'8"  Wt: 249Temp: 98.0 BP: 128/83 Pulse: 84 O2 94% on room air.   Physical Exam: General:  Alert, NAD. Trendelenberg Gait  HEENT: EOMI, Good Neck Extension  Pulm: No increased work of breathing.  Clear B/L A/P w/o crackle or wheeze.  CV: RRR, No m/g/r appreciated  GI: soft, NT, ND Neuro: Neuro without gross focal deficit.  Sensation intact distally Skin: No lesions in the area of chief complaint MSK/Surgical Site: Left Hip pain with passive ROM.  Positive Stinchfield.  5/5 strength.  NVI.  Sensation intact distally.  Imaging  Review Plain radiographs demonstrate severe degenerative joint disease of the left hip.   Preoperative templating of the joint replacement has been completed, documented, and submitted to the Operating Room personnel in order to optimize intra-operative equipment management.  Assessment: OA LEFT HIP Active Problems:   Hx of seizure disorder   Depression, major, recurrent, moderate (HCC)   Plan: Plan for Procedure(s): LEFT TOTAL HIP ARTHROPLASTY ANTERIOR APPROACH  The patient history, physical exam, clinical judgement of the provider and imaging are consistent with end stage degenerative joint disease and total joint arthroplasty is deemed medically necessary. The treatment options including medical management, injection therapy, and arthroplasty were discussed at length. The risks and benefits of Procedure(s): LEFT TOTAL HIP ARTHROPLASTY ANTERIOR APPROACH were presented and reviewed.  The risks of nonoperative treatment, versus surgical intervention including but not limited to continued pain, aseptic loosening, stiffness, dislocation/subluxation, infection, bleeding, nerve injury, blood clots, cardiopulmonary complications, morbidity, mortality, among others were discussed. The patient verbalizes understanding and wishes to proceed with the plan.  Patient is being admitted for inpatient treatment for surgery, pain control, PT, OT, prophylactic antibiotics, VTE prophylaxis, progressive ambulation, ADL's and discharge planning.   Dental prophylaxis discussed and recommended for 2 years postoperatively.   The patient does meet the criteria for TXA which will be used perioperatively.    ASA 81 mg BID will be used postoperatively for DVT prophylaxis in addition to SCDs, and early ambulation.  Plan for Norco or oxycodone, Gabapentin, Celebrex for pain postoperatively.  Order CPAP at bedtime for OSA.  The patient is planning to be discharged home with home health services ([facility])  in care of her foster sister, daughter and son in Social worker.   Albina Billet III, PA-C 09/15/2018 4:12 PM

## 2018-10-20 DIAGNOSIS — M1612 Unilateral primary osteoarthritis, left hip: Secondary | ICD-10-CM | POA: Diagnosis not present

## 2018-10-27 ENCOUNTER — Other Ambulatory Visit: Payer: Self-pay

## 2018-10-27 DIAGNOSIS — F331 Major depressive disorder, recurrent, moderate: Secondary | ICD-10-CM

## 2018-10-27 MED ORDER — SERTRALINE HCL 100 MG PO TABS: 200.0000 mg | ORAL_TABLET | Freq: Every day | ORAL | 0 refills | Status: DC

## 2018-10-28 ENCOUNTER — Other Ambulatory Visit: Payer: Self-pay

## 2018-10-28 ENCOUNTER — Emergency Department: Payer: Medicare Other

## 2018-10-28 ENCOUNTER — Emergency Department
Admission: EM | Admit: 2018-10-28 | Discharge: 2018-10-28 | Disposition: A | Discharge status: Home / Self Care | Payer: Medicare Other | Attending: Emergency Medicine | Admitting: Emergency Medicine

## 2018-10-28 ENCOUNTER — Encounter: Payer: Self-pay | Admitting: Emergency Medicine

## 2018-10-28 DIAGNOSIS — N10 Acute tubulo-interstitial nephritis: Secondary | ICD-10-CM | POA: Insufficient documentation

## 2018-10-28 DIAGNOSIS — J449 Chronic obstructive pulmonary disease, unspecified: Secondary | ICD-10-CM | POA: Diagnosis not present

## 2018-10-28 DIAGNOSIS — N12 Tubulo-interstitial nephritis, not specified as acute or chronic: Secondary | ICD-10-CM

## 2018-10-28 DIAGNOSIS — I272 Pulmonary hypertension, unspecified: Secondary | ICD-10-CM | POA: Insufficient documentation

## 2018-10-28 DIAGNOSIS — Z87891 Personal history of nicotine dependence: Secondary | ICD-10-CM | POA: Diagnosis not present

## 2018-10-28 DIAGNOSIS — Z7982 Long term (current) use of aspirin: Secondary | ICD-10-CM | POA: Insufficient documentation

## 2018-10-28 DIAGNOSIS — Z79899 Other long term (current) drug therapy: Secondary | ICD-10-CM | POA: Insufficient documentation

## 2018-10-28 DIAGNOSIS — N200 Calculus of kidney: Secondary | ICD-10-CM | POA: Diagnosis not present

## 2018-10-28 DIAGNOSIS — R1032 Left lower quadrant pain: Secondary | ICD-10-CM | POA: Diagnosis present

## 2018-10-28 LAB — COMPREHENSIVE METABOLIC PANEL
ALK PHOS: 79 U/L (ref 38–126)
ALT: 18 U/L (ref 0–44)
ANION GAP: 8 (ref 5–15)
AST: 19 U/L (ref 15–41)
Albumin: 4.3 g/dL (ref 3.5–5.0)
BILIRUBIN TOTAL: 0.5 mg/dL (ref 0.3–1.2)
BUN: 18 mg/dL (ref 6–20)
CALCIUM: 9.3 mg/dL (ref 8.9–10.3)
CO2: 27 mmol/L (ref 22–32)
CREATININE: 0.66 mg/dL (ref 0.44–1.00)
Chloride: 108 mmol/L (ref 98–111)
GFR calc non Af Amer: >60 mL/min (ref >60)
Glucose, Bld: 89 mg/dL (ref 70–99)
Potassium: 3.5 mmol/L (ref 3.5–5.1)
SODIUM: 143 mmol/L (ref 135–145)
TOTAL PROTEIN: 7.9 g/dL (ref 6.5–8.1)

## 2018-10-28 LAB — CBC
HCT: 45.2 % (ref 36.0–46.0)
Hemoglobin: 14.8 g/dL (ref 12.0–15.0)
MCH: 30.4 pg (ref 26.0–34.0)
MCHC: 32.7 g/dL (ref 30.0–36.0)
MCV: 92.8 fL (ref 80.0–100.0)
NRBC: 0 % (ref 0.0–0.2)
PLATELETS: 302 10*3/uL (ref 150–400)
RBC: 4.87 MIL/uL (ref 3.87–5.11)
RDW: 14.4 % (ref 11.5–15.5)
WBC: 7.7 10*3/uL (ref 4.0–10.5)

## 2018-10-28 LAB — URINALYSIS, COMPLETE (UACMP) WITH MICROSCOPIC
Bilirubin Urine: NEGATIVE
Glucose, UA: NEGATIVE mg/dL
Ketones, ur: NEGATIVE mg/dL
Nitrite: NEGATIVE
PH: 5 (ref 5.0–8.0)
Protein, ur: 30 mg/dL — AB
Specific Gravity, Urine: 1.021 (ref 1.005–1.030)
WBC, UA: >50 WBC/hpf — ABNORMAL HIGH (ref 0–5)

## 2018-10-28 LAB — LIPASE, BLOOD: Lipase: 40 U/L (ref 11–51)

## 2018-10-28 MED ORDER — CEFDINIR 300 MG PO CAPS: 300.0000 mg | ORAL_CAPSULE | Freq: Two times a day (BID) | ORAL | 0 refills | Status: DC

## 2018-10-28 MED ORDER — KETOROLAC TROMETHAMINE 30 MG/ML IJ SOLN
30.0000 mg | Freq: Once | INTRAMUSCULAR | Status: AC
  Administered 2018-10-28: 30 mg via INTRAVENOUS
  Filled 2018-10-28: qty 1

## 2018-10-28 MED ORDER — CEFDINIR 300 MG PO CAPS
300.0000 mg | ORAL_CAPSULE | Freq: Two times a day (BID) | ORAL | Status: DC
  Administered 2018-10-28: 300 mg via ORAL
  Filled 2018-10-28: qty 1

## 2018-10-28 MED ORDER — SODIUM CHLORIDE 0.9 % IV BOLUS
1000.0000 mL | Freq: Once | INTRAVENOUS | Status: AC
  Administered 2018-10-28: 1000 mL via INTRAVENOUS

## 2018-10-28 MED ORDER — CEPHALEXIN 500 MG PO CAPS: 500.0000 mg | ORAL_CAPSULE | Freq: Once | ORAL | Status: DC

## 2018-10-28 NOTE — ED Triage Notes (Signed)
Pt reports that she was here in July and diagnoised with a kidney stone, reports that she feels the same way. She is hurting in her LLQ and states that she has blood in her urine.

## 2018-10-28 NOTE — ED Provider Notes (Addendum)
[facility] Emergency Department Provider Note  ___________________________________________   First MD Initiated Contact with Patient 10/28/18 2014     (approximate)  I have reviewed the triage vital signs and the nursing notes.   HISTORY  Chief Complaint Abdominal Pain   HPI Nicole Wyatt is a 52 y.o. female with history of bipolar disorder as well as a kidney stone this past July who was presented to the emergency department to 3 weeks of left lower quadrant abdominal pain radiating through her back.  She is concerned that she may still have a kidney stone from this past July.  She reports nausea but no vomiting.  Says the pain is a 9 out of 10 and sharp.  Says that she is also having urinary frequency and blood in her urine.   Past Medical History:  Diagnosis Date  . Abnormal Pap smear of cervix   . Anxiety   . Arthritis   . Bipolar 1 disorder (HCC)   . COPD (chronic obstructive pulmonary disease) (HCC)   . Depression   . Dyspnea    with exertion   . Kidney stone   . Oxygen dependent    patient uses 2L oz PRN ; reports hardly ever uses it unless very SOB    . Rocky Mountain spotted fever 2018  . Sarcoidosis of lung (HCC)    managed by Dr Lindie Spruce   . Seizures (HCC) last seizure 05/2013  . Sleep apnea    uses CPAP nightly     Patient Active Problem List   Diagnosis Date Noted  . 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 OSA (obstructive sleep apnea) 10/13/2018  . 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 [place] spotted fever 07/02/2018  . Urinary tract infection without hematuria 07/02/2018  . Dysuria 06/23/2018  . Depression, major, recurrent, moderate (HCC) 03/26/2018  . Generalized edema 03/26/2018  . Anaphylactic syndrome 03/26/2018  . Cutaneous candidiasis 03/26/2018  . Bursitis of hip 02/03/2018  . Osteoarthritis of knee 02/03/2018  . Chronic pulmonary hypertension (HCC) 06/18/2016  . S/P cardiac cath 06/18/2016  . Unstable  angina (HCC) 05/24/2016  . Bilateral leg edema 05/22/2016  . Shortness of breath 04/30/2016  . Skin lesion of right arm 03/01/2016  . Screening for breast cancer 06/10/2015  . Skin lesion of breast 06/10/2015  . Hx of seizure disorder 06/24/2014  . Left-sided weakness 06/24/2014  . Tobacco abuse 06/24/2014    Past Surgical History:  Procedure Laterality Date  . ABDOMINAL HYSTERECTOMY  1999  . BLADDER SURGERY    . CARDIAC CATHETERIZATION Bilateral 05/29/2016   Procedure: Right/Left Heart Cath and Coronary Angiography;  Surgeon: Lamar Blinks, MD;  Location: [facility] INVASIVE CV LAB;  Service: Cardiovascular;  Laterality: Bilateral;  . COLONOSCOPY    . COLONOSCOPY N/A 07/19/2015   Procedure: COLONOSCOPY;  Surgeon: Earline Mayotte, MD;  Location: [facility] ENDOSCOPY;  Service: Endoscopy;  Laterality: N/A;  . FLEXIBLE BRONCHOSCOPY N/A 07/27/2016   Procedure: FLEXIBLE BRONCHOSCOPY;  Surgeon: Yevonne Pax, MD;  Location: [facility] ORS;  Service: Pulmonary;  Laterality: N/A;  . FOOT SURGERY    . KIDNEY STONE SURGERY  7 years  . TUBAL LIGATION      Prior to Admission medications   Medication Sig Start Date End Date Taking? Authorizing Provider  acetaminophen (TYLENOL) 500 MG tablet Take 1,000 mg by mouth every 6 (six) hours as needed for pain.     [provider]  albuterol (PROVENTIL HFA;VENTOLIN HFA) 108 (90 Base)  MCG/ACT inhaler Inhale 2 puffs into the lungs every 4 (four) hours as needed for wheezing or shortness of breath.     [provider]  albuterol (PROVENTIL) (2.5 MG/3ML) 0.083% nebulizer solution Take 3 mLs (2.5 mg total) by nebulization every 6 (six) hours as needed for wheezing or shortness of breath. 03/25/18   Carlean Jews, NP  aspirin EC 81 MG tablet Take 81 mg by mouth daily.    [provider]  ciprofloxacin (CIPRO) 500 MG tablet Take 1 tablet (500 mg total) by mouth 2 (two) times daily. Patient not taking: Reported on 06/23/2018 03/06/18   Governor Rooks, MD   clotrimazole-betamethasone (LOTRISONE) cream Apply 1 application topically 2 (two) times daily. Patient not taking: Reported on 09/17/2018 07/25/18   Carlean Jews, NP  diazepam (VALIUM) 5 MG tablet Take 1 tablet (5 mg total) by mouth every 8 (eight) hours as needed for muscle spasms. 10/07/17   Emily Filbert, MD  doxycycline (VIBRAMYCIN) 100 MG capsule Take 1 capsule (100 mg total) by mouth 2 (two) times daily. Patient not taking: Reported on 09/17/2018 06/24/18   Carlean Jews, NP  ergocalciferol (DRISDOL) 50000 units capsule Take 1 capsule (50,000 Units total) by mouth once a week. Patient not taking: Reported on 09/17/2018 07/10/18   Carlean Jews, NP  furosemide (LASIX) 40 MG tablet Take 1 tablet (40 mg total) by mouth daily. 03/25/18   Carlean Jews, NP  HYDROcodone-acetaminophen (NORCO) 5-325 MG tablet Take 1 tablet by mouth every 6 (six) hours as needed for up to 11 doses for severe pain. Patient not taking: Reported on 09/17/2018 07/16/18   Merrily Brittle, MD  ibuprofen (ADVIL,MOTRIN) 600 MG tablet Take 1 tablet (600 mg total) by mouth every 8 (eight) hours as needed. 07/16/18   Merrily Brittle, MD  isosorbide mononitrate (IMDUR) 30 MG 24 hr tablet Take 30 mg by mouth daily.    [provider]  Lacosamide (VIMPAT) 100 MG TABS Take 1 tablet (100 mg total) by mouth 2 (two) times daily. Patient not taking: Reported on 09/17/2018 09/25/15   Emily Filbert, MD  meloxicam (MOBIC) 15 MG tablet Take 1 tablet (15 mg total) by mouth daily. 07/28/18   Carlean Jews, NP  mometasone (ASMANEX) 220 MCG/INH inhaler Inhale 2 puffs into the lungs daily.    [provider]  mometasone-formoterol (DULERA) 100-5 MCG/ACT AERO Inhale 2 puffs into the lungs daily as needed for wheezing or shortness of breath.    [provider]  nitrofurantoin, macrocrystal-monohydrate, (MACROBID) 100 MG capsule Take 1 capsule (100 mg total) by mouth 2 (two) times daily. Patient not  taking: Reported on 09/17/2018 06/24/18   Carlean Jews, NP  nystatin (NYSTATIN) powder Apply topically 4 (four) times daily. Patient not taking: Reported on 09/17/2018 03/25/18   Carlean Jews, NP  OXYGEN Inhale 2 L into the lungs continuous.     [provider]  sertraline (ZOLOFT) 100 MG tablet Take 2 tablets (200 mg total) by mouth daily. 10/27/18   Carlean Jews, NP  tamsulosin (FLOMAX) 0.4 MG CAPS capsule Take 1 capsule (0.4 mg total) by mouth daily. Patient not taking: Reported on 09/17/2018 07/16/18   Merrily Brittle, MD  triamcinolone ointment (KENALOG) 0.5 % Apply 1 application topically 2 (two) times daily. Patient not taking: Reported on 09/17/2018 06/02/18   Kem Boroughs B, FNP  venlafaxine XR (EFFEXOR-XR) 75 MG 24 hr capsule Take 1 capsule (75 mg total) by mouth daily with  breakfast. 09/26/18   Carlean Jews, NP    Allergies Bee venom; Penicillins; and Ciprofloxacin  Family History  Problem Relation Age of Onset  . Hypertension Mother   . Arthritis Mother   . Hypertension Father   . Arthritis Father   . Prostate cancer Father   . Ovarian cancer Maternal Grandmother     Social History Social History   Tobacco Use  . Smoking status: Former Smoker    Packs/day: 1.00    Years: 14.00    Pack years: 14.00    Types: Cigarettes    Last attempt to quit: 05/07/2016    Years since quitting: 2.4  . Smokeless tobacco: Never Used  Substance Use Topics  . Alcohol use: Yes    Alcohol/week: 0.0 standard drinks    Comment: occ   . Drug use: No    Review of Systems  Constitutional: No fever/chills Eyes: No visual changes. ENT: No sore throat. Cardiovascular: Denies chest pain. Respiratory: Denies shortness of breath. Gastrointestinal:  no vomiting.  No diarrhea.  No constipation. Genitourinary: Negative for dysuria. Musculoskeletal:  as above Skin: Negative for rash. Neurological: Negative for headaches, focal weakness or  numbness.   ____________________________________________   PHYSICAL EXAM:  VITAL SIGNS: ED Triage Vitals  Enc Vitals Group     BP 10/28/18 1948 (!) 150/71     Pulse Rate 10/28/18 1948 85     Resp 10/28/18 1948 18     Temp 10/28/18 1948 98.5 F (36.9 C)     Temp Source 10/28/18 1948 Oral     SpO2 10/28/18 1948 100 %     Weight 10/28/18 1949 245 lb (111.1 kg)     Height 10/28/18 1949 5\' 6"  (1.676 m)     Head Circumference --      Peak Flow --      Pain Score 10/28/18 1952 10     Pain Loc --      Pain Edu? --      Excl. in GC? --     Constitutional: Alert and oriented. Well appearing and in no acute distress. Eyes: Conjunctivae are normal.  Head: Atraumatic. Nose: No congestion/rhinnorhea. Mouth/Throat: Mucous membranes are moist.  Neck: No stridor.   Cardiovascular: Normal rate, regular rhythm. Grossly normal heart sounds.   Respiratory: Normal respiratory effort.  No retractions. Lungs CTAB. Gastrointestinal: Soft with minimal left lower quadrant tenderness palpation with minimal left CVA tenderness.  No distention.  Musculoskeletal: No lower extremity tenderness nor edema.  No joint effusions. Neurologic:  Normal speech and language. No gross focal neurologic deficits are appreciated. Skin:  Skin is warm, dry and intact. No rash noted. Psychiatric: Mood and affect are normal. Speech and behavior are normal.  ____________________________________________   LABS (all labs ordered are listed, but only abnormal results are displayed)  Labs Reviewed  URINALYSIS, COMPLETE (UACMP) WITH MICROSCOPIC - Abnormal; Notable for the following components:      Result Value   Color, Urine YELLOW (*)    APPearance CLOUDY (*)    Hgb urine dipstick MODERATE (*)    Protein, ur 30 (*)    Leukocytes, UA LARGE (*)    WBC, UA >50 (*)    Bacteria, UA MANY (*)    All other components within normal limits  URINE CULTURE  LIPASE, BLOOD  COMPREHENSIVE METABOLIC PANEL  CBC    ____________________________________________  EKG   ____________________________________________  RADIOLOGY  No acute findings on the CT renal. ____________________________________________   PROCEDURES  Procedure(s) performed:  Procedures  Critical Care performed:   ____________________________________________   INITIAL IMPRESSION / ASSESSMENT AND PLAN / ED COURSE  Pertinent labs & imaging results that were available during my care of the patient were reviewed by me and considered in my medical decision making (see chart for details).  Differential diagnosis includes, but is not limited to, ovarian cyst, ovarian torsion, acute appendicitis, diverticulitis, urinary tract infection/pyelonephritis, endometriosis, bowel obstruction, colitis, renal colic, gastroenteritis, hernia, fibroids, endometriosis, pregnancy related pain including ectopic pregnancy, etc. As part of my medical decision making, I reviewed the following data within the electronic MEDICAL RECORD NUMBER Notes from prior ED visits  ----------------------------------------- 9:32 PM on 10/28/2018 -----------------------------------------  Patient resting comfortably at this time.  Appears to have pyelonephritis.  Will be discharged with Keflex.  Patient understanding of the diagnosis as well as treatment and willing to comply. ____________________________________________   FINAL CLINICAL IMPRESSION(S) / ED DIAGNOSES  Pyelonephritis.  NEW MEDICATIONS STARTED DURING THIS VISIT:  New Prescriptions   No medications on file     Note:  This document was prepared using Dragon voice recognition software and may include unintentional dictation errors.     Myrna Blazer, MD 10/28/18 2132  Culture results reviewed.  Will switch to Ceftin ear because of past bacterial resistance.   Myrna Blazer, MD 10/28/18 2135

## 2018-10-30 LAB — URINE CULTURE

## 2018-11-05 ENCOUNTER — Other Ambulatory Visit: Payer: Self-pay

## 2018-11-05 MED ORDER — MELOXICAM 15 MG PO TABS: 15.0000 mg | ORAL_TABLET | Freq: Every day | ORAL | 3 refills | Status: DC

## 2018-11-07 ENCOUNTER — Ambulatory Visit: Payer: Self-pay | Admitting: Nurse Practitioner

## 2018-11-09 ENCOUNTER — Emergency Department
Admission: EM | Admit: 2018-11-09 | Discharge: 2018-11-09 | Disposition: A | Discharge status: Home / Self Care | Payer: Medicare Other | Attending: Emergency Medicine | Admitting: Emergency Medicine

## 2018-11-09 ENCOUNTER — Other Ambulatory Visit: Payer: Self-pay

## 2018-11-09 ENCOUNTER — Encounter: Payer: Self-pay | Admitting: Emergency Medicine

## 2018-11-09 ENCOUNTER — Emergency Department: Payer: Medicare Other

## 2018-11-09 DIAGNOSIS — M25552 Pain in left hip: Secondary | ICD-10-CM | POA: Insufficient documentation

## 2018-11-09 DIAGNOSIS — Z7982 Long term (current) use of aspirin: Secondary | ICD-10-CM | POA: Insufficient documentation

## 2018-11-09 DIAGNOSIS — Z79899 Other long term (current) drug therapy: Secondary | ICD-10-CM | POA: Diagnosis not present

## 2018-11-09 DIAGNOSIS — I959 Hypotension, unspecified: Secondary | ICD-10-CM | POA: Diagnosis not present

## 2018-11-09 DIAGNOSIS — R52 Pain, unspecified: Secondary | ICD-10-CM | POA: Diagnosis not present

## 2018-11-09 DIAGNOSIS — J449 Chronic obstructive pulmonary disease, unspecified: Secondary | ICD-10-CM | POA: Diagnosis not present

## 2018-11-09 DIAGNOSIS — R609 Edema, unspecified: Secondary | ICD-10-CM | POA: Diagnosis not present

## 2018-11-09 DIAGNOSIS — F17210 Nicotine dependence, cigarettes, uncomplicated: Secondary | ICD-10-CM | POA: Diagnosis not present

## 2018-11-09 MED ORDER — OXYCODONE-ACETAMINOPHEN 5-325 MG PO TABS
1.0000 | ORAL_TABLET | Freq: Once | ORAL | Status: AC
  Administered 2018-11-09: 1 via ORAL
  Filled 2018-11-09: qty 1

## 2018-11-09 MED ORDER — PREDNISONE 50 MG PO TABS: ORAL_TABLET | ORAL | 0 refills | Status: DC

## 2018-11-09 MED ORDER — PREDNISONE 20 MG PO TABS
60.0000 mg | ORAL_TABLET | Freq: Once | ORAL | Status: AC
  Administered 2018-11-09: 60 mg via ORAL
  Filled 2018-11-09: qty 3

## 2018-11-09 NOTE — ED Provider Notes (Signed)
[facility] Emergency Department Provider Note  ____________________________________________  Time seen: Approximately 9:23 PM  I have reviewed the triage vital signs and the nursing notes.   HISTORY  Chief Complaint Hip Pain    HPI Nicole Wyatt is a 52 y.o. female with a history of left hip arthritis, presents to the emergency department with worsening left hip pain.  Patient presented to the emergency department via EMS.  Patient anticipates a left total hip arthroplasty procedure on December 02, 2018.  Patient reports that pain is so severe, she is unable to help take care of her elderly parents.  She denies numbness or tingling in the lower extremities.  No new falls or traumas.  No saddle anesthesia or bowel or bladder incontinence. No alleviating measures have been attempted.    Past Medical History:  Diagnosis Date  . Abnormal Pap smear of cervix   . Anxiety   . Arthritis   . Bipolar 1 disorder (HCC)   . COPD (chronic obstructive pulmonary disease) (HCC)   . Depression   . Dyspnea    with exertion   . Kidney stone   . Oxygen dependent    patient uses 2L oz PRN ; reports hardly ever uses it unless very SOB    . Rocky Mountain spotted fever 2018  . Sarcoidosis of lung (HCC)    managed by Dr Lindie Spruce   . Seizures (HCC) last seizure 05/2013  . Sleep apnea    uses CPAP nightly     Patient Active Problem List   Diagnosis Date Noted  . 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 OSA (obstructive sleep apnea) 10/13/2018  . 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 [place] spotted fever 07/02/2018  . Urinary tract infection without hematuria 07/02/2018  . Dysuria 06/23/2018  . Depression, major, recurrent, moderate (HCC) 03/26/2018  . Generalized edema 03/26/2018  . Anaphylactic syndrome 03/26/2018  . Cutaneous candidiasis 03/26/2018  . Bursitis of hip 02/03/2018  . Osteoarthritis of knee 02/03/2018  . Chronic pulmonary hypertension (HCC)  06/18/2016  . S/P cardiac cath 06/18/2016  . Unstable angina (HCC) 05/24/2016  . Bilateral leg edema 05/22/2016  . Shortness of breath 04/30/2016  . Skin lesion of right arm 03/01/2016  . Screening for breast cancer 06/10/2015  . Skin lesion of breast 06/10/2015  . Hx of seizure disorder 06/24/2014  . Left-sided weakness 06/24/2014  . Tobacco abuse 06/24/2014    Past Surgical History:  Procedure Laterality Date  . ABDOMINAL HYSTERECTOMY  1999  . BLADDER SURGERY    . CARDIAC CATHETERIZATION Bilateral 05/29/2016   Procedure: Right/Left Heart Cath and Coronary Angiography;  Surgeon: Lamar Blinks, MD;  Location: [facility] INVASIVE CV LAB;  Service: Cardiovascular;  Laterality: Bilateral;  . COLONOSCOPY    . COLONOSCOPY N/A 07/19/2015   Procedure: COLONOSCOPY;  Surgeon: Earline Mayotte, MD;  Location[facility] ENDOSCOPY;  Service: Endoscopy;  Laterality: N/A;  . FLEXIBLE BRONCHOSCOPY N/A 07/27/2016   Procedure: FLEXIBLE BRONCHOSCOPY;  Surgeon: Yevonne Pax, MD;  Location: [facility] ORS;  Service: Pulmonary;  Laterality: N/A;  . FOOT SURGERY    . KIDNEY STONE SURGERY  7 years  . TUBAL LIGATION      Prior to Admission medications   Medication Sig Start Date End Date Taking? Authorizing Provider  acetaminophen (TYLENOL) 500 MG tablet Take 1,000 mg by mouth every 6 (six) hours as needed for pain.     [provider]  albuterol (PROVENTIL HFA;VENTOLIN HFA) 108 (90 Base) MCG/ACT inhaler  Inhale 2 puffs into the lungs every 4 (four) hours as needed for wheezing or shortness of breath.     [provider]  albuterol (PROVENTIL) (2.5 MG/3ML) 0.083% nebulizer solution Take 3 mLs (2.5 mg total) by nebulization every 6 (six) hours as needed for wheezing or shortness of breath. 03/25/18   Carlean JewsBoscia, Heather E, NP  aspirin EC 81 MG tablet Take 81 mg by mouth daily.    [provider]  cefdinir (OMNICEF) 300 MG capsule Take 1 capsule (300 mg total) by mouth 2 (two) times daily. 10/28/18    Schaevitz, Myra Rudeavid Matthew, MD  ciprofloxacin (CIPRO) 500 MG tablet Take 1 tablet (500 mg total) by mouth 2 (two) times daily. Patient not taking: Reported on 06/23/2018 03/06/18   Governor RooksLord, Rebecca, MD  clotrimazole-betamethasone (LOTRISONE) cream Apply 1 application topically 2 (two) times daily. Patient not taking: Reported on 09/17/2018 07/25/18   Carlean JewsBoscia, Heather E, NP  diazepam (VALIUM) 5 MG tablet Take 1 tablet (5 mg total) by mouth every 8 (eight) hours as needed for muscle spasms. 10/07/17   Emily FilbertWilliams, Jonathan E, MD  doxycycline (VIBRAMYCIN) 100 MG capsule Take 1 capsule (100 mg total) by mouth 2 (two) times daily. Patient not taking: Reported on 09/17/2018 06/24/18   Carlean JewsBoscia, Heather E, NP  ergocalciferol (DRISDOL) 50000 units capsule Take 1 capsule (50,000 Units total) by mouth once a week. Patient not taking: Reported on 09/17/2018 07/10/18   Carlean JewsBoscia, Heather E, NP  furosemide (LASIX) 40 MG tablet Take 1 tablet (40 mg total) by mouth daily. 03/25/18   Carlean JewsBoscia, Heather E, NP  HYDROcodone-acetaminophen (NORCO) 5-325 MG tablet Take 1 tablet by mouth every 6 (six) hours as needed for up to 11 doses for severe pain. Patient not taking: Reported on 09/17/2018 07/16/18   Merrily Brittleifenbark, Neil, MD  ibuprofen (ADVIL,MOTRIN) 600 MG tablet Take 1 tablet (600 mg total) by mouth every 8 (eight) hours as needed. 07/16/18   Merrily Brittleifenbark, Neil, MD  isosorbide mononitrate (IMDUR) 30 MG 24 hr tablet Take 30 mg by mouth daily.    [provider]  Lacosamide (VIMPAT) 100 MG TABS Take 1 tablet (100 mg total) by mouth 2 (two) times daily. Patient not taking: Reported on 09/17/2018 09/25/15   Emily FilbertWilliams, Jonathan E, MD  meloxicam (MOBIC) 15 MG tablet Take 1 tablet (15 mg total) by mouth daily. 11/05/18   Carlean JewsBoscia, Heather E, NP  mometasone (ASMANEX) 220 MCG/INH inhaler Inhale 2 puffs into the lungs daily.    [provider]  mometasone-formoterol (DULERA) 100-5 MCG/ACT AERO Inhale 2 puffs into the lungs daily as needed for  wheezing or shortness of breath.    [provider]  nitrofurantoin, macrocrystal-monohydrate, (MACROBID) 100 MG capsule Take 1 capsule (100 mg total) by mouth 2 (two) times daily. Patient not taking: Reported on 09/17/2018 06/24/18   Carlean JewsBoscia, Heather E, NP  nystatin (NYSTATIN) powder Apply topically 4 (four) times daily. Patient not taking: Reported on 09/17/2018 03/25/18   Carlean JewsBoscia, Heather E, NP  OXYGEN Inhale 2 L into the lungs continuous.     [provider]  predniSONE (DELTASONE) 50 MG tablet Take one 50 mg tablet once daily for the next five days. 11/09/18   Orvil FeilWoods, Dewanna Hurston M, PA-C  sertraline (ZOLOFT) 100 MG tablet Take 2 tablets (200 mg total) by mouth daily. 10/27/18   Carlean JewsBoscia, Heather E, NP  tamsulosin (FLOMAX) 0.4 MG CAPS capsule Take 1 capsule (0.4 mg total) by mouth daily. Patient not taking: Reported on 09/17/2018 07/16/18   Rifenbark,  Lloyd Huger, MD  triamcinolone ointment (KENALOG) 0.5 % Apply 1 application topically 2 (two) times daily. Patient not taking: Reported on 09/17/2018 06/02/18   Chinita Pester, FNP  venlafaxine XR (EFFEXOR-XR) 75 MG 24 hr capsule Take 1 capsule (75 mg total) by mouth daily with breakfast. 09/26/18   Carlean Jews, NP    Allergies Bee venom; Penicillins; and Ciprofloxacin  Family History  Problem Relation Age of Onset  . Hypertension Mother   . Arthritis Mother   . Hypertension Father   . Arthritis Father   . Prostate cancer Father   . Ovarian cancer Maternal Grandmother     Social History Social History   Tobacco Use  . Smoking status: Current Every Day Smoker    Packs/day: 0.50    Years: 14.00    Pack years: 7.00    Types: Cigarettes    Last attempt to quit: 05/07/2016    Years since quitting: 2.5  . Smokeless tobacco: Never Used  Substance Use Topics  . Alcohol use: Yes    Alcohol/week: 0.0 standard drinks    Comment: occ   . Drug use: No     Review of Systems  Constitutional: No fever/chills Eyes: No visual changes. No  discharge ENT: No upper respiratory complaints. Cardiovascular: no chest pain. Respiratory: no cough. No SOB. Gastrointestinal: No abdominal pain.  No nausea, no vomiting.  No diarrhea.  No constipation. Musculoskeletal: Patient has left hip pain.  Skin: Negative for rash, abrasions, lacerations, ecchymosis. Neurological: Negative for headaches, focal weakness or numbness.   ____________________________________________   PHYSICAL EXAM:  VITAL SIGNS: ED Triage Vitals  Enc Vitals Group     BP 11/09/18 1951 120/65     Pulse Rate 11/09/18 1951 74     Resp 11/09/18 1951 16     Temp 11/09/18 1951 98.7 F (37.1 C)     Temp Source 11/09/18 1951 Oral     SpO2 11/09/18 1951 95 %     Weight 11/09/18 1952 245 lb (111.1 kg)     Height 11/09/18 1952 5\' 6"  (1.676 m)     Head Circumference --      Peak Flow --      Pain Score 11/09/18 1952 10     Pain Loc --      Pain Edu? --      Excl. in GC? --      Constitutional: Alert and oriented. Well appearing and in no acute distress. Eyes: Conjunctivae are normal. PERRL. EOMI. Head: Atraumatic. Cardiovascular: Normal rate, regular rhythm. Normal S1 and S2.  Good peripheral circulation. Respiratory: Normal respiratory effort without tachypnea or retractions. Lungs CTAB. Good air entry to the bases with no decreased or absent breath sounds. Gastrointestinal: Bowel sounds 4 quadrants. Soft and nontender to palpation. No guarding or rigidity. No palpable masses. No distention. No CVA tenderness. Musculoskeletal: Patient has pain with internal and external rotation of the left hip.  Palpable dorsalis pedis pulse, left. Neurologic:  Normal speech and language. No gross focal neurologic deficits are appreciated.  Skin:  Skin is warm, dry and intact. No rash noted. Psychiatric: Mood and affect are normal. Speech and behavior are normal. Patient exhibits appropriate insight and judgement.   ____________________________________________   LABS (all  labs ordered are listed, but only abnormal results are displayed)  Labs Reviewed - No data to display ____________________________________________  EKG   ____________________________________________  RADIOLOGY I personally viewed and evaluated these images as part of my medical decision making, as well as  reviewing the written report by the radiologist.  Dg Hip Unilat With Pelvis 2-3 Views Left  Result Date: 11/09/2018 CLINICAL DATA:  Worsening left hip pain. EXAM: DG HIP (WITH OR WITHOUT PELVIS) 2-3V LEFT COMPARISON:  None. FINDINGS: Severe degenerative changes in the left hip with complete joint space loss. Extensive subchondral sclerosis and cyst formation. Moderate degenerative changes in the right hip. No acute bony abnormality. Specifically, no fracture, subluxation, or dislocation. IMPRESSION: Severe left and moderate right degenerative hip changes. No acute bony abnormality. Electronically Signed   By: Charlett Nose M.D.   On: 11/09/2018 20:18    ____________________________________________    PROCEDURES  Procedure(s) performed:    Procedures    Medications  oxyCODONE-acetaminophen (PERCOCET/ROXICET) 5-325 MG per tablet 1 tablet (1 tablet Oral Given 11/09/18 2116)  predniSONE (DELTASONE) tablet 60 mg (60 mg Oral Given 11/09/18 2116)     ____________________________________________   INITIAL IMPRESSION / ASSESSMENT AND PLAN / ED COURSE  Pertinent labs & imaging results that were available during my care of the patient were reviewed by me and considered in my medical decision making (see chart for details).  Review of the  [place] was performed in accordance of the [facility] prior to dispensing any controlled drugs.     Assessment and plan Left hip pain Patient presents to the emergency department with worsening left hip pain.  X-ray examination of the left hip shows moderate to severe osteoarthritis.  Patient was started on a short course of prednisone as patient is  still a month out from her hip arthroplasty date.  Patient is already taking meloxicam and has tried tramadol at home.  Patient was given 1 Roxicet for pain control in the emergency department.  She was advised to follow-up with orthopedics.  All patient questions were answered.   ____________________________________________  FINAL CLINICAL IMPRESSION(S) / ED DIAGNOSES  Final diagnoses:  Left hip pain      NEW MEDICATIONS STARTED DURING THIS VISIT:  ED Discharge Orders         Ordered    predniSONE (DELTASONE) 50 MG tablet     11/09/18 2103              This chart was dictated using voice recognition software/Dragon. Despite best efforts to proofread, errors can occur which can change the meaning. Any change was purely unintentional.    Orvil Feil, PA-C 11/09/18 2126    Sharyn Creamer, MD 11/10/18 [phone]

## 2018-11-09 NOTE — ED Triage Notes (Signed)
Pt to ED via [facility] from home c/o left hip pain 3 days ago was walking to toilet and gave out in time for patient to sit onto commode.  Scheduled hip replacement on December 10th.  Denies other falls, A&Ox4.

## 2018-11-09 NOTE — ED Notes (Signed)
l  Hip gave  Out  While  Walking and  Pt sat  Down  Pt  States  Is  Scheduled   For  Surgery  Dec  10   Pt  Did  Not fall    Pt uses a  Cane   She  Reports  The  Leg has  Been giving  Out for the last 3  Days

## 2018-11-13 ENCOUNTER — Emergency Department: Payer: Medicare Other

## 2018-11-13 ENCOUNTER — Encounter: Payer: Self-pay | Admitting: Emergency Medicine

## 2018-11-13 ENCOUNTER — Emergency Department
Admission: EM | Admit: 2018-11-13 | Discharge: 2018-11-14 | Disposition: A | Discharge status: Home / Self Care | Payer: Medicare Other | Attending: Emergency Medicine | Admitting: Emergency Medicine

## 2018-11-13 ENCOUNTER — Other Ambulatory Visit: Payer: Self-pay

## 2018-11-13 DIAGNOSIS — Z7982 Long term (current) use of aspirin: Secondary | ICD-10-CM | POA: Insufficient documentation

## 2018-11-13 DIAGNOSIS — N201 Calculus of ureter: Secondary | ICD-10-CM | POA: Diagnosis not present

## 2018-11-13 DIAGNOSIS — F17210 Nicotine dependence, cigarettes, uncomplicated: Secondary | ICD-10-CM | POA: Insufficient documentation

## 2018-11-13 DIAGNOSIS — N132 Hydronephrosis with renal and ureteral calculous obstruction: Secondary | ICD-10-CM | POA: Diagnosis not present

## 2018-11-13 DIAGNOSIS — R109 Unspecified abdominal pain: Secondary | ICD-10-CM | POA: Diagnosis present

## 2018-11-13 DIAGNOSIS — N390 Urinary tract infection, site not specified: Secondary | ICD-10-CM

## 2018-11-13 DIAGNOSIS — J449 Chronic obstructive pulmonary disease, unspecified: Secondary | ICD-10-CM | POA: Diagnosis not present

## 2018-11-13 DIAGNOSIS — Z79899 Other long term (current) drug therapy: Secondary | ICD-10-CM | POA: Diagnosis not present

## 2018-11-13 DIAGNOSIS — N200 Calculus of kidney: Secondary | ICD-10-CM

## 2018-11-13 HISTORY — DX: Urinary tract infection, site not specified: N39.0

## 2018-11-13 LAB — URINALYSIS, COMPLETE (UACMP) WITH MICROSCOPIC
BACTERIA UA: NONE SEEN
BILIRUBIN URINE: NEGATIVE
Glucose, UA: NEGATIVE mg/dL
Ketones, ur: NEGATIVE mg/dL
NITRITE: NEGATIVE
PROTEIN: 100 mg/dL — AB
Specific Gravity, Urine: 1.013 (ref 1.005–1.030)
pH: 8 (ref 5.0–8.0)

## 2018-11-13 LAB — CBC
HEMATOCRIT: 46 % (ref 36.0–46.0)
HEMOGLOBIN: 15.2 g/dL — AB (ref 12.0–15.0)
MCH: 30.6 pg (ref 26.0–34.0)
MCHC: 33 g/dL (ref 30.0–36.0)
MCV: 92.6 fL (ref 80.0–100.0)
Platelets: 265 10*3/uL (ref 150–400)
RBC: 4.97 MIL/uL (ref 3.87–5.11)
RDW: 14.1 % (ref 11.5–15.5)
WBC: 9.7 10*3/uL (ref 4.0–10.5)
nRBC: 0 % (ref 0.0–0.2)

## 2018-11-13 LAB — LIPASE, BLOOD: Lipase: 31 U/L (ref 11–51)

## 2018-11-13 LAB — COMPREHENSIVE METABOLIC PANEL
ALBUMIN: 4.4 g/dL (ref 3.5–5.0)
ALT: 22 U/L (ref 0–44)
AST: 19 U/L (ref 15–41)
Alkaline Phosphatase: 81 U/L (ref 38–126)
Anion gap: 9 (ref 5–15)
BILIRUBIN TOTAL: 0.5 mg/dL (ref 0.3–1.2)
BUN: 18 mg/dL (ref 6–20)
CHLORIDE: 104 mmol/L (ref 98–111)
CO2: 27 mmol/L (ref 22–32)
Calcium: 9.1 mg/dL (ref 8.9–10.3)
Creatinine, Ser: 0.85 mg/dL (ref 0.44–1.00)
GFR calc Af Amer: >60 mL/min (ref >60)
GFR calc non Af Amer: >60 mL/min (ref >60)
GLUCOSE: 121 mg/dL — AB (ref 70–99)
POTASSIUM: 3.8 mmol/L (ref 3.5–5.1)
SODIUM: 140 mmol/L (ref 135–145)
TOTAL PROTEIN: 8 g/dL (ref 6.5–8.1)

## 2018-11-13 MED ORDER — ONDANSETRON HCL 4 MG/2ML IJ SOLN
4.0000 mg | Freq: Once | INTRAMUSCULAR | Status: AC
  Administered 2018-11-13: 4 mg via INTRAVENOUS
  Filled 2018-11-13: qty 2

## 2018-11-13 MED ORDER — SODIUM CHLORIDE 0.9 % IV SOLN
1.0000 g | Freq: Once | INTRAVENOUS | Status: AC
  Administered 2018-11-13: 1 g via INTRAVENOUS
  Filled 2018-11-13: qty 10

## 2018-11-13 MED ORDER — OXYCODONE-ACETAMINOPHEN 5-325 MG PO TABS
2.0000 | ORAL_TABLET | Freq: Once | ORAL | Status: AC
  Administered 2018-11-14: 2 via ORAL
  Filled 2018-11-13: qty 2

## 2018-11-13 MED ORDER — CEPHALEXIN 500 MG PO CAPS: 500.0000 mg | ORAL_CAPSULE | Freq: Two times a day (BID) | ORAL | 0 refills | Status: DC

## 2018-11-13 MED ORDER — TAMSULOSIN HCL 0.4 MG PO CAPS: 0.4000 mg | ORAL_CAPSULE | Freq: Every day | ORAL | 0 refills | Status: DC

## 2018-11-13 MED ORDER — MORPHINE SULFATE (PF) 4 MG/ML IV SOLN
4.0000 mg | Freq: Once | INTRAVENOUS | Status: AC
  Administered 2018-11-13: 4 mg via INTRAVENOUS
  Filled 2018-11-13: qty 1

## 2018-11-13 MED ORDER — OXYCODONE-ACETAMINOPHEN 5-325 MG PO TABS: 1.0000 | ORAL_TABLET | ORAL | 0 refills | Status: DC | PRN

## 2018-11-13 NOTE — Discharge Instructions (Signed)
These call the number provided for urology to arrange a follow-up appointment as soon as possible.  Please take your pain medication as needed, as written.  Take your Flomax on a daily basis as prescribed.  Take your antibiotic for its entire course.  Return to the emergency department for any worsening pain development of fever, or any other symptom personally concerning to yourself.

## 2018-11-13 NOTE — ED Notes (Signed)
Patient transported to CT at this time. 

## 2018-11-13 NOTE — ED Notes (Signed)
Patient complaining of pressure and urgency. Bladder scan showed 100 ml.

## 2018-11-13 NOTE — ED Triage Notes (Signed)
Patient reports being seen in ED on Sunday and diagnosed with kidney stone. Since Sunday, patient reports no relief of pain. Also reports decreased urine output today. States she last urinated approximately 3 pm today. Patient reports pain in LLQ, radiating to hip.

## 2018-11-13 NOTE — ED Provider Notes (Signed)
[facility] Emergency Department Provider Note  Time seen: 11:02 PM  I have reviewed the triage vital signs and the nursing notes.   HISTORY  Chief Complaint Abdominal Pain    HPI Nicole Wyatt is a 52 y.o. female with a past medical history anxiety, arthritis, bipolar, COPD, depression, kidney stones, sarcoidosis, presents to the emergency department for left-sided abdominal pain.  According to the patient for the past 5 months she has been experiencing left-sided abdominal pain ever since being diagnosed with a kidney stone 5 months ago.  Patient states she was told by her doctor that the kidney stone had passed but she continues to have left-sided abdominal pain.  She also states she is scheduled for a left hip replacement at the beginning of December, but states her left hip has been hurting her much worse over the past several weeks as well.  Denies any fever.  States occasional nausea, denies any diarrhea denies dysuria or hematuria. Past Medical History:  Diagnosis Date  . Abnormal Pap smear of cervix   . Anxiety   . Arthritis   . Bipolar 1 disorder (HCC)   . COPD (chronic obstructive pulmonary disease) (HCC)   . Depression   . Dyspnea    with exertion   . Kidney stone   . Oxygen dependent    patient uses 2L oz PRN ; reports hardly ever uses it unless very SOB    . Rocky Mountain spotted fever 2018  . Sarcoidosis of lung (HCC)    managed by Dr Lindie Spruce   . Seizures (HCC) last seizure 05/2013  . Sleep apnea    uses CPAP nightly     Patient Active Problem List   Diagnosis Date Noted  . 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 OSA (obstructive sleep apnea) 10/13/2018  . 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 Rocky [place] spotted fever 07/02/2018  . Urinary tract infection without hematuria 07/02/2018  . Dysuria 06/23/2018  . Depression, major, recurrent, moderate (HCC) 03/26/2018  . Generalized edema 03/26/2018  . Anaphylactic syndrome  03/26/2018  . Cutaneous candidiasis 03/26/2018  . Bursitis of hip 02/03/2018  . Osteoarthritis of knee 02/03/2018  . Chronic pulmonary hypertension (HCC) 06/18/2016  . S/P cardiac cath 06/18/2016  . Unstable angina (HCC) 05/24/2016  . Bilateral leg edema 05/22/2016  . Shortness of breath 04/30/2016  . Skin lesion of right arm 03/01/2016  . Screening for breast cancer 06/10/2015  . Skin lesion of breast 06/10/2015  . Hx of seizure disorder 06/24/2014  . Left-sided weakness 06/24/2014  . Tobacco abuse 06/24/2014    Past Surgical History:  Procedure Laterality Date  . ABDOMINAL HYSTERECTOMY  1999  . BLADDER SURGERY    . CARDIAC CATHETERIZATION Bilateral 05/29/2016   Procedure: Right/Left Heart Cath and Coronary Angiography;  Surgeon: Lamar Blinks, MD;  Location: [facility] INVASIVE CV LAB;  Service: Cardiovascular;  Laterality: Bilateral;  . COLONOSCOPY    . COLONOSCOPY N/A 07/19/2015   Procedure: COLONOSCOPY;  Surgeon: Earline Mayotte, MD;  Location: [facility] ENDOSCOPY;  Service: Endoscopy;  Laterality: N/A;  . FLEXIBLE BRONCHOSCOPY N/A 07/27/2016   Procedure: FLEXIBLE BRONCHOSCOPY;  Surgeon: Yevonne Pax, MD;  Location: [facility] ORS;  Service: Pulmonary;  Laterality: N/A;  . FOOT SURGERY    . KIDNEY STONE SURGERY  7 years  . TUBAL LIGATION      Prior to Admission medications   Medication Sig Start Date End Date Taking? Authorizing Provider  acetaminophen (TYLENOL) 500 MG tablet Take 1,000  mg by mouth every 6 (six) hours as needed for pain.     [provider]  albuterol (PROVENTIL HFA;VENTOLIN HFA) 108 (90 Base) MCG/ACT inhaler Inhale 2 puffs into the lungs every 4 (four) hours as needed for wheezing or shortness of breath.     [provider]  albuterol (PROVENTIL) (2.5 MG/3ML) 0.083% nebulizer solution Take 3 mLs (2.5 mg total) by nebulization every 6 (six) hours as needed for wheezing or shortness of breath. 03/25/18   Carlean JewsBoscia, Heather E, NP  aspirin EC 81 MG tablet Take 81  mg by mouth daily.    [provider]  cefdinir (OMNICEF) 300 MG capsule Take 1 capsule (300 mg total) by mouth 2 (two) times daily. 10/28/18   Schaevitz, Myra Rudeavid Matthew, MD  ciprofloxacin (CIPRO) 500 MG tablet Take 1 tablet (500 mg total) by mouth 2 (two) times daily. Patient not taking: Reported on 06/23/2018 03/06/18   Governor RooksLord, Rebecca, MD  clotrimazole-betamethasone (LOTRISONE) cream Apply 1 application topically 2 (two) times daily. Patient not taking: Reported on 09/17/2018 07/25/18   Carlean JewsBoscia, Heather E, NP  diazepam (VALIUM) 5 MG tablet Take 1 tablet (5 mg total) by mouth every 8 (eight) hours as needed for muscle spasms. 10/07/17   Emily FilbertWilliams, Jonathan E, MD  doxycycline (VIBRAMYCIN) 100 MG capsule Take 1 capsule (100 mg total) by mouth 2 (two) times daily. Patient not taking: Reported on 09/17/2018 06/24/18   Carlean JewsBoscia, Heather E, NP  ergocalciferol (DRISDOL) 50000 units capsule Take 1 capsule (50,000 Units total) by mouth once a week. Patient not taking: Reported on 09/17/2018 07/10/18   Carlean JewsBoscia, Heather E, NP  furosemide (LASIX) 40 MG tablet Take 1 tablet (40 mg total) by mouth daily. 03/25/18   Carlean JewsBoscia, Heather E, NP  HYDROcodone-acetaminophen (NORCO) 5-325 MG tablet Take 1 tablet by mouth every 6 (six) hours as needed for up to 11 doses for severe pain. Patient not taking: Reported on 09/17/2018 07/16/18   Merrily Brittleifenbark, Neil, MD  ibuprofen (ADVIL,MOTRIN) 600 MG tablet Take 1 tablet (600 mg total) by mouth every 8 (eight) hours as needed. 07/16/18   Merrily Brittleifenbark, Neil, MD  isosorbide mononitrate (IMDUR) 30 MG 24 hr tablet Take 30 mg by mouth daily.    [provider]  Lacosamide (VIMPAT) 100 MG TABS Take 1 tablet (100 mg total) by mouth 2 (two) times daily. Patient not taking: Reported on 09/17/2018 09/25/15   Emily FilbertWilliams, Jonathan E, MD  meloxicam (MOBIC) 15 MG tablet Take 1 tablet (15 mg total) by mouth daily. 11/05/18   Carlean JewsBoscia, Heather E, NP  mometasone (ASMANEX) 220 MCG/INH inhaler Inhale 2 puffs into  the lungs daily.    [provider]  mometasone-formoterol (DULERA) 100-5 MCG/ACT AERO Inhale 2 puffs into the lungs daily as needed for wheezing or shortness of breath.    [provider]  nitrofurantoin, macrocrystal-monohydrate, (MACROBID) 100 MG capsule Take 1 capsule (100 mg total) by mouth 2 (two) times daily. Patient not taking: Reported on 09/17/2018 06/24/18   Carlean JewsBoscia, Heather E, NP  nystatin (NYSTATIN) powder Apply topically 4 (four) times daily. Patient not taking: Reported on 09/17/2018 03/25/18   Carlean JewsBoscia, Heather E, NP  OXYGEN Inhale 2 L into the lungs continuous.     [provider]  predniSONE (DELTASONE) 50 MG tablet Take one 50 mg tablet once daily for the next five days. 11/09/18   Orvil FeilWoods, Jaclyn M, PA-C  sertraline (ZOLOFT) 100 MG tablet Take 2 tablets (200 mg total) by mouth daily. 10/27/18   Vincent GrosBoscia, Heather  E, NP  tamsulosin (FLOMAX) 0.4 MG CAPS capsule Take 1 capsule (0.4 mg total) by mouth daily. Patient not taking: Reported on 09/17/2018 07/16/18   Merrily Brittle, MD  triamcinolone ointment (KENALOG) 0.5 % Apply 1 application topically 2 (two) times daily. Patient not taking: Reported on 09/17/2018 06/02/18   Kem Boroughs B, FNP  venlafaxine XR (EFFEXOR-XR) 75 MG 24 hr capsule Take 1 capsule (75 mg total) by mouth daily with breakfast. 09/26/18   Carlean Jews, NP    Allergies  Allergen Reactions  . Bee Venom Anaphylaxis  . Penicillins Rash    Has patient had a PCN reaction causing immediate rash, facial/tongue/throat swelling, SOB or lightheadedness with hypotension: yes Has patient had a PCN reaction causing severe rash involving mucus membranes or skin necrosis: {yes Has patient had a PCN reaction that required hospitalization yes Has patient had a PCN reaction occurring within the last 10 years: {yes If all of the above answers are "NO", then may proceed with Cephalosporin use.  . Ciprofloxacin Nausea Only    Family History  Problem Relation  Age of Onset  . Hypertension Mother   . Arthritis Mother   . Hypertension Father   . Arthritis Father   . Prostate cancer Father   . Ovarian cancer Maternal Grandmother     Social History Social History   Tobacco Use  . Smoking status: Current Every Day Smoker    Packs/day: 0.50    Years: 14.00    Pack years: 7.00    Types: Cigarettes    Last attempt to quit: 05/07/2016    Years since quitting: 2.5  . Smokeless tobacco: Never Used  Substance Use Topics  . Alcohol use: Yes    Alcohol/week: 0.0 standard drinks    Comment: occ   . Drug use: No    Review of Systems Constitutional: Negative for fever. Cardiovascular: Negative for chest pain. Respiratory: Negative for shortness of breath. Gastrointestinal: Left-sided abdominal pain.  Positive for nausea.  Negative for diarrhea. Genitourinary: Negative for urinary compaints Musculoskeletal: Left hip pain Skin: Negative for skin complaints  Neurological: Negative for headache All other ROS negative  ____________________________________________   PHYSICAL EXAM:  VITAL SIGNS: ED Triage Vitals [11/13/18 1822]  Enc Vitals Group     BP (!) 152/68     Pulse Rate 62     Resp 16     Temp 98.6 F (37 C)     Temp Source Oral     SpO2 98 %     Weight 245 lb (111.1 kg)     Height 5\' 6"  (1.676 m)     Head Circumference      Peak Flow      Pain Score 10     Pain Loc      Pain Edu?      Excl. in GC?    Constitutional: Alert and oriented. Well appearing and in no distress. Eyes: Normal exam ENT   Head: Normocephalic and atraumatic.   Mouth/Throat: Mucous membranes are moist. Cardiovascular: Normal rate, regular rhythm. No murmur Respiratory: Normal respiratory effort without tachypnea nor retractions. Breath sounds are clear  Gastrointestinal: Soft, mild left lower quadrant tenderness to palpation, no rebound guarding or distention. Musculoskeletal: Nontender with normal range of motion in all extremities.   Neurologic:  Normal speech and language. No gross focal neurologic deficits  Skin:  Skin is warm, dry and intact.  Psychiatric: Mood and affect are normal.  ____________________________________________   RADIOLOGY  CT scan shows  a 7 mm proximal left ureteral stone with hydronephrosis and perinephric stranding.  ____________________________________________   INITIAL IMPRESSION / ASSESSMENT AND PLAN / ED COURSE  Pertinent labs & imaging results that were available during my care of the patient were reviewed by me and considered in my medical decision making (see chart for details).  Patient presents emergency department for left-sided abdominal pain ongoing x5 months but worse over the past few days per patient.  Differential is quite broad but would include ureterolithiasis, kidney stone, pyelonephritis, diverticulitis or colitis.  Patient's lab work is reassuring however her urinalysis has resulted positive for urinary tract infection.  A urine culture has been sent we will dose IV Rocephin.  Given her left-sided abdominal pain we will dose pain medication and obtain a CT renal scan to evaluate for ureterolithiasis or diverticulitis.  Patient agreeable to plan of care.  As far as her left hip pain patient has good range of motion in the left hip, this is likely chronic degenerative changes I discussed follow-up with her orthopedist.  CT scan shows a 7 mm proximal left ureteral stone.  Given the patient's urinalysis results we will run it by urology.  Likely will discharge with antibiotics and urology follow-up given no fever no white blood cell count elevation.  Patient states her pain is significantly decreased after pain medication.  So far unable to reach urology.  If we cannot reach urology will likely discharge with antibiotics Flomax and Percocet have the patient follow-up with urology by calling the office tomorrow.  I discussed my return precautions to the patient for any fever,  dysuria or worsening pain.  Patient agreeable plan of care.  ____________________________________________   FINAL CLINICAL IMPRESSION(S) / ED DIAGNOSES  Theda Sers, MD 11/13/18 2339

## 2018-11-16 LAB — URINE CULTURE: Culture: >=100000 — AB

## 2018-11-24 NOTE — Patient Instructions (Addendum)
Nicole ScalesSelena L Wyatt  11/24/2018   Your procedure is scheduled on: 12-02-18   [facility]    Report to admitting at 5:30AM    Call this number if you have problems the morning of surgery [phone]   PLEASE BRING CPAP MASK AND  TUBING ONLY. DEVICE WILL BE PROVIDED!    Remember: Do not eat food or drink liquids :After Midnight. BRUSH YOUR TEETH MORNING OF SURGERY AND RINSE YOUR MOUTH OUT, NO CHEWING GUM CANDY OR MINTS.     Take these medicines the morning of surgery with A SIP OF WATER: sertraline, venlafaxine, albuterol nhaler and nebulizer if needed, Asmanex inhaler if needed, tylenol if needed                                 You may not have any metal on your body including hair pins and              piercings  Do not wear jewelry, make-up, lotions, powders or perfumes, deodorant             Do not wear nail polish.  Do not shave  48 hours prior to surgery.     Do not bring valuables to the hospital. [facility] IS NOT             RESPONSIBLE   FOR VALUABLES.  Contacts, dentures or bridgework may not be worn into surgery.  Leave suitcase in the car. After surgery it may be brought to your room.                  Please read over the following fact sheets you were given: _____________________________________________________________________             [facility] - Preparing [facility] Before surgery, you can play an important role.  Because skin is not sterile, your skin needs to be as free of germs as possible.  You can reduce the number of germs on your skin by washing with CHG (chlorahexidine gluconate) soap before surgery.  CHG is an antiseptic cleaner which kills germs and bonds with the skin to continue killing germs even after washing. Please DO NOT use if you have an allergy to CHG or antibacterial soaps.  If your skin becomes reddened/irritated stop using the CHG and inform your nurse when you arrive at Short  Stay. Do not shave (including legs and underarms) for at least 48 hours prior to the first CHG shower.  You may shave your face/neck. Please follow these instructions carefully:  1.  Shower with CHG Soap the night before surgery and the  morning of Surgery.  2.  If you choose to wash your hair, wash your hair first as usual with your  normal  shampoo.  3.  After you shampoo, rinse your hair and body thoroughly to remove the  shampoo.                           4.  Use CHG as you would any other liquid soap.  You can apply chg directly  to the skin and wash                       Gently with a scrungie or clean washcloth.  5.  Apply the CHG Soap to your body ONLY FROM THE NECK DOWN.   Do not use on face/ open                           Wound or open sores. Avoid contact with eyes, ears mouth and genitals (private parts).                       Wash face,  Genitals (private parts) with your normal soap.             6.  Wash thoroughly, paying special attention to the area where your surgery  will be performed.  7.  Thoroughly rinse your body with warm water from the neck down.  8.  DO NOT shower/wash with your normal soap after using and rinsing off  the CHG Soap.                9.  Pat yourself dry with a clean towel.            10.  Wear clean pajamas.            11.  Place clean sheets on your bed the night of your first shower and do not  sleep with pets. Day of Surgery : Do not apply any lotions/deodorants the morning of surgery.  Please wear clean clothes to the hospital/surgery center.  FAILURE TO FOLLOW THESE INSTRUCTIONS MAY RESULT IN THE CANCELLATION OF YOUR SURGERY PATIENT SIGNATURE_________________________________  NURSE SIGNATURE__________________________________  ________________________________________________________________________   [name]  An incentive spirometer is a tool that can help keep your lungs clear and active. This tool measures how well you are  filling your lungs with each breath. Taking long deep breaths may help reverse or decrease the chance of developing breathing (pulmonary) problems (especially infection) following:  A long period of time when you are unable to move or be active. BEFORE THE PROCEDURE   If the spirometer includes an indicator to show your best effort, your nurse or respiratory therapist will set it to a desired goal.  If possible, sit up straight or lean slightly forward. Try not to slouch.  Hold the incentive spirometer in an upright position. INSTRUCTIONS FOR USE  1. Sit on the edge of your bed if possible, or sit up as far as you can in bed or on a chair. 2. Hold the incentive spirometer in an upright position. 3. Breathe out normally. 4. Place the mouthpiece in your mouth and seal your lips tightly around it. 5. Breathe in slowly and as deeply as possible, raising the piston or the ball toward the top of the column. 6. Hold your breath for 3-5 seconds or for as long as possible. Allow the piston or ball to fall to the bottom of the column. 7. Remove the mouthpiece from your mouth and breathe out normally. 8. Rest for a few seconds and repeat Steps 1 through 7 at least 10 times every 1-2 hours when you are awake. Take your time and take a few normal breaths between deep breaths. 9. The spirometer may include an indicator to show your best effort. Use the indicator as a goal to work toward during each repetition. 10. After each set of 10 deep breaths, practice coughing to be sure your lungs are clear. If you have an incision (the cut made at the time of surgery), support your incision when coughing by placing a  pillow or rolled up towels firmly against it. Once you are able to get out of bed, walk around indoors and cough well. You may stop using the incentive spirometer when instructed by your caregiver.  RISKS AND COMPLICATIONS  Take your time so you do not get dizzy or light-headed.  If you are in pain,  you may need to take or ask for pain medication before doing incentive spirometry. It is harder to take a deep breath if you are having pain. AFTER USE  Rest and breathe slowly and easily.  It can be helpful to keep track of a log of your progress. Your caregiver can provide you with a simple table to help with this. If you are using the spirometer at home, follow these instructions: [place] IF:   You are having difficultly using the spirometer.  You have trouble using the spirometer as often as instructed.  Your pain medication is not giving enough relief while using the spirometer.  You develop fever of 100.5 F (38.1 C) or higher. SEEK IMMEDIATE MEDICAL CARE IF:   You cough up bloody sputum that had not been present before.  You develop fever of 102 F (38.9 C) or greater.  You develop worsening pain at or near the incision site. MAKE SURE YOU:   Understand these instructions.  Will watch your condition.  Will get help right away if you are not doing well or get worse. Document Released: 04/22/2007 Document Revised: 03/03/2012 Document Reviewed: 06/23/2007 [facility] Patient Information 2014 [place], [place].   ________________________________________________________________________

## 2018-11-24 NOTE — Progress Notes (Signed)
Cbc, cmp, urinalysis, urine culture 11-13-18 epic   ekg 10-13-18 epic

## 2018-11-25 ENCOUNTER — Encounter (HOSPITAL_COMMUNITY)
Admission: RE | Admit: 2018-11-25 | Discharge: 2018-11-25 | Disposition: A | Discharge status: Home / Self Care | Payer: Medicare Other | Source: Ambulatory Visit | Attending: Orthopedic Surgery | Admitting: Orthopedic Surgery

## 2018-11-25 ENCOUNTER — Encounter (HOSPITAL_COMMUNITY): Payer: Self-pay

## 2018-11-25 ENCOUNTER — Other Ambulatory Visit: Payer: Self-pay

## 2018-11-25 DIAGNOSIS — M1612 Unilateral primary osteoarthritis, left hip: Secondary | ICD-10-CM | POA: Diagnosis not present

## 2018-11-25 DIAGNOSIS — Z01812 Encounter for preprocedural laboratory examination: Secondary | ICD-10-CM | POA: Insufficient documentation

## 2018-11-25 HISTORY — DX: Urinary tract infection, site not specified: N39.0

## 2018-11-25 LAB — SURGICAL PCR SCREEN
MRSA, PCR: NEGATIVE
STAPHYLOCOCCUS AUREUS: NEGATIVE

## 2018-11-27 ENCOUNTER — Other Ambulatory Visit: Payer: Self-pay | Admitting: Orthopedic Surgery

## 2018-11-27 NOTE — Care Plan (Signed)
Spoke with patient prior to surgery. She will discharge to home with sister to assist her. She prefers to only has HHPT. Referral to [facility]. Walker ordered.   Shauna HughRenee Angiulli, RNCM  [phone]

## 2018-12-01 ENCOUNTER — Encounter (HOSPITAL_COMMUNITY): Payer: Self-pay | Admitting: Certified Registered Nurse Anesthetist

## 2018-12-01 MED ORDER — BUPIVACAINE LIPOSOME 1.3 % IJ SUSP
20.0000 mL | Freq: Once | INTRAMUSCULAR | Status: DC
  Filled 2018-12-01: qty 20

## 2018-12-01 NOTE — Anesthesia Preprocedure Evaluation (Addendum)
Anesthesia Evaluation  Patient identified by MRN, date of birth, ID band Patient awake    Reviewed: Allergy & Precautions, NPO status , Patient's Chart, lab work & pertinent test results  Airway Mallampati: I  TM Distance: >3 FB Neck ROM: Full    Dental no notable dental hx. (+) Teeth Intact, Dental Advisory Given   Pulmonary shortness of breath and with exertion, sleep apnea and Continuous Positive Airway Pressure Ventilation , COPD (2L prn),  oxygen dependent, Current Smoker,  sarcoidosis   Pulmonary exam normal breath sounds clear to auscultation       Cardiovascular + angina Normal cardiovascular exam Rhythm:Regular Rate:Normal  LHC 2017 The patient has had canadian class 4 angina with low risk stress test and risk factors which include high blood pressure and high cholesterol. Left ventricular function is normal with ejection fraction of 60%. Normal coronary arteries with 0% stenosis Mild pulmonary hypertension and mild elevation of LVEDP   Neuro/Psych Seizures - (last seizure 2014, not taking anti-epileptics), Well Controlled,  PSYCHIATRIC DISORDERS Anxiety Depression Bipolar Disorder    GI/Hepatic negative GI ROS, Neg liver ROS,   Endo/Other  Morbid obesity  Renal/GU negative Renal ROS  negative genitourinary   Musculoskeletal  (+) Arthritis , Osteoarthritis,    Abdominal   Peds  Hematology negative hematology ROS (+)   Anesthesia Other Findings   Reproductive/Obstetrics                            Anesthesia Physical Anesthesia Plan  ASA: III  Anesthesia Plan: Spinal and General   Post-op Pain Management:    Induction:   PONV Risk Score and Plan: Treatment may vary due to age or medical condition  Airway Management Planned: Natural Airway  Additional Equipment:   Intra-op Plan:   Post-operative Plan:   Informed Consent: I have reviewed the patients History and Physical,  chart, labs and discussed the procedure including the risks, benefits and alternatives for the proposed anesthesia with the patient or authorized representative who has indicated his/her understanding and acceptance.   Dental advisory given  Plan Discussed with: CRNA  Anesthesia Plan Comments:         Anesthesia Quick Evaluation

## 2018-12-02 ENCOUNTER — Encounter (HOSPITAL_COMMUNITY)
Admission: RE | Disposition: A | Discharge status: Home Health | Payer: Self-pay | Source: Home / Self Care | Attending: Orthopedic Surgery

## 2018-12-02 ENCOUNTER — Inpatient Hospital Stay (HOSPITAL_COMMUNITY)
Admission: RE | Admit: 2018-12-02 | Discharge: 2018-12-03 | DRG: 470 | Disposition: A | Discharge status: Home Health | Payer: Medicare Other | Attending: Orthopedic Surgery | Admitting: Orthopedic Surgery

## 2018-12-02 ENCOUNTER — Inpatient Hospital Stay (HOSPITAL_COMMUNITY): Payer: Medicare Other | Admitting: Certified Registered Nurse Anesthetist

## 2018-12-02 ENCOUNTER — Other Ambulatory Visit: Payer: Self-pay

## 2018-12-02 ENCOUNTER — Inpatient Hospital Stay (HOSPITAL_COMMUNITY): Payer: Medicare Other

## 2018-12-02 ENCOUNTER — Encounter (HOSPITAL_COMMUNITY): Payer: Self-pay

## 2018-12-02 DIAGNOSIS — F319 Bipolar disorder, unspecified: Secondary | ICD-10-CM | POA: Diagnosis not present

## 2018-12-02 DIAGNOSIS — Z6841 Body Mass Index (BMI) 40.0 and over, adult: Secondary | ICD-10-CM

## 2018-12-02 DIAGNOSIS — M1612 Unilateral primary osteoarthritis, left hip: Principal | ICD-10-CM | POA: Diagnosis present

## 2018-12-02 DIAGNOSIS — Z885 Allergy status to narcotic agent status: Secondary | ICD-10-CM

## 2018-12-02 DIAGNOSIS — Z8261 Family history of arthritis: Secondary | ICD-10-CM | POA: Diagnosis not present

## 2018-12-02 DIAGNOSIS — Z8041 Family history of malignant neoplasm of ovary: Secondary | ICD-10-CM | POA: Diagnosis not present

## 2018-12-02 DIAGNOSIS — R569 Unspecified convulsions: Secondary | ICD-10-CM | POA: Diagnosis present

## 2018-12-02 DIAGNOSIS — M1610 Unilateral primary osteoarthritis, unspecified hip: Secondary | ICD-10-CM | POA: Diagnosis present

## 2018-12-02 DIAGNOSIS — F419 Anxiety disorder, unspecified: Secondary | ICD-10-CM | POA: Diagnosis present

## 2018-12-02 DIAGNOSIS — M25752 Osteophyte, left hip: Secondary | ICD-10-CM | POA: Diagnosis present

## 2018-12-02 DIAGNOSIS — G40909 Epilepsy, unspecified, not intractable, without status epilepticus: Secondary | ICD-10-CM | POA: Diagnosis present

## 2018-12-02 DIAGNOSIS — Z79899 Other long term (current) drug therapy: Secondary | ICD-10-CM | POA: Diagnosis not present

## 2018-12-02 DIAGNOSIS — Z8249 Family history of ischemic heart disease and other diseases of the circulatory system: Secondary | ICD-10-CM | POA: Diagnosis not present

## 2018-12-02 DIAGNOSIS — D860 Sarcoidosis of lung: Secondary | ICD-10-CM | POA: Diagnosis not present

## 2018-12-02 DIAGNOSIS — G4733 Obstructive sleep apnea (adult) (pediatric): Secondary | ICD-10-CM | POA: Diagnosis present

## 2018-12-02 DIAGNOSIS — Z87442 Personal history of urinary calculi: Secondary | ICD-10-CM

## 2018-12-02 DIAGNOSIS — Z880 Allergy status to penicillin: Secondary | ICD-10-CM

## 2018-12-02 DIAGNOSIS — Z9981 Dependence on supplemental oxygen: Secondary | ICD-10-CM | POA: Diagnosis not present

## 2018-12-02 DIAGNOSIS — Z90710 Acquired absence of both cervix and uterus: Secondary | ICD-10-CM

## 2018-12-02 DIAGNOSIS — Z8042 Family history of malignant neoplasm of prostate: Secondary | ICD-10-CM

## 2018-12-02 DIAGNOSIS — J449 Chronic obstructive pulmonary disease, unspecified: Secondary | ICD-10-CM | POA: Diagnosis not present

## 2018-12-02 DIAGNOSIS — F445 Conversion disorder with seizures or convulsions: Secondary | ICD-10-CM | POA: Diagnosis present

## 2018-12-02 DIAGNOSIS — Z96642 Presence of left artificial hip joint: Secondary | ICD-10-CM | POA: Diagnosis not present

## 2018-12-02 DIAGNOSIS — F17201 Nicotine dependence, unspecified, in remission: Secondary | ICD-10-CM | POA: Diagnosis present

## 2018-12-02 DIAGNOSIS — Z471 Aftercare following joint replacement surgery: Secondary | ICD-10-CM | POA: Diagnosis not present

## 2018-12-02 DIAGNOSIS — Z791 Long term (current) use of non-steroidal anti-inflammatories (NSAID): Secondary | ICD-10-CM | POA: Diagnosis not present

## 2018-12-02 DIAGNOSIS — F331 Major depressive disorder, recurrent, moderate: Secondary | ICD-10-CM | POA: Diagnosis present

## 2018-12-02 DIAGNOSIS — M25559 Pain in unspecified hip: Secondary | ICD-10-CM

## 2018-12-02 HISTORY — PX: TOTAL HIP ARTHROPLASTY: SHX124

## 2018-12-02 SURGERY — ARTHROPLASTY, HIP, TOTAL, ANTERIOR APPROACH
Anesthesia: Spinal | Site: Hip | Laterality: Left

## 2018-12-02 MED ORDER — ACETAMINOPHEN 500 MG PO TABS: 1000.0000 mg | ORAL_TABLET | Freq: Three times a day (TID) | ORAL | 0 refills | Status: AC

## 2018-12-02 MED ORDER — GABAPENTIN 300 MG PO CAPS: 300.0000 mg | ORAL_CAPSULE | Freq: Three times a day (TID) | ORAL | 0 refills | Status: DC

## 2018-12-02 MED ORDER — CHLORHEXIDINE GLUCONATE 4 % EX LIQD: 60.0000 mL | Freq: Once | CUTANEOUS | Status: DC

## 2018-12-02 MED ORDER — LACTATED RINGERS IV SOLN
INTRAVENOUS | Status: DC
  Administered 2018-12-02: 15:00:00 via INTRAVENOUS

## 2018-12-02 MED ORDER — ACETAMINOPHEN 500 MG PO TABS
1000.0000 mg | ORAL_TABLET | Freq: Once | ORAL | Status: AC
  Administered 2018-12-02: 1000 mg via ORAL
  Filled 2018-12-02: qty 2

## 2018-12-02 MED ORDER — MIDAZOLAM HCL 2 MG/2ML IJ SOLN
INTRAMUSCULAR | Status: AC
  Filled 2018-12-02: qty 2

## 2018-12-02 MED ORDER — PROPOFOL 10 MG/ML IV BOLUS
INTRAVENOUS | Status: AC
  Filled 2018-12-02: qty 60

## 2018-12-02 MED ORDER — OXYCODONE HCL 5 MG PO TABS: 5.0000 mg | ORAL_TABLET | ORAL | 0 refills | Status: AC | PRN

## 2018-12-02 MED ORDER — DEXAMETHASONE SODIUM PHOSPHATE 10 MG/ML IJ SOLN
INTRAMUSCULAR | Status: DC | PRN
  Administered 2018-12-02: 5 mg via INTRAVENOUS

## 2018-12-02 MED ORDER — ACETAMINOPHEN 500 MG PO TABS
1000.0000 mg | ORAL_TABLET | Freq: Four times a day (QID) | ORAL | Status: AC
  Administered 2018-12-02 – 2018-12-03 (×4): 1000 mg via ORAL
  Filled 2018-12-02 (×4): qty 2

## 2018-12-02 MED ORDER — METHOCARBAMOL 500 MG PO TABS
500.0000 mg | ORAL_TABLET | Freq: Four times a day (QID) | ORAL | Status: DC | PRN
  Administered 2018-12-02 – 2018-12-03 (×4): 500 mg via ORAL
  Filled 2018-12-02 (×4): qty 1

## 2018-12-02 MED ORDER — WATER FOR IRRIGATION, STERILE IR SOLN
Status: DC | PRN
  Administered 2018-12-02: 2000 mL

## 2018-12-02 MED ORDER — DOCUSATE SODIUM 100 MG PO CAPS
100.0000 mg | ORAL_CAPSULE | Freq: Two times a day (BID) | ORAL | Status: DC
  Administered 2018-12-02 – 2018-12-03 (×2): 100 mg via ORAL
  Filled 2018-12-02 (×2): qty 1

## 2018-12-02 MED ORDER — SORBITOL 70 % SOLN
30.0000 mL | Freq: Every day | Status: DC | PRN
  Filled 2018-12-02: qty 30

## 2018-12-02 MED ORDER — PROPOFOL 500 MG/50ML IV EMUL
INTRAVENOUS | Status: DC | PRN
  Administered 2018-12-02: 100 ug/kg/min via INTRAVENOUS

## 2018-12-02 MED ORDER — PROPOFOL 10 MG/ML IV BOLUS
INTRAVENOUS | Status: AC
  Filled 2018-12-02: qty 20

## 2018-12-02 MED ORDER — HYDROMORPHONE HCL 1 MG/ML IJ SOLN
0.5000 mg | INTRAMUSCULAR | Status: DC | PRN
  Administered 2018-12-02: 0.5 mg via INTRAVENOUS
  Filled 2018-12-02: qty 1

## 2018-12-02 MED ORDER — METOCLOPRAMIDE HCL 5 MG/ML IJ SOLN: 5.0000 mg | Freq: Three times a day (TID) | INTRAMUSCULAR | Status: DC | PRN

## 2018-12-02 MED ORDER — MENTHOL 3 MG MT LOZG: 1.0000 | LOZENGE | OROMUCOSAL | Status: DC | PRN

## 2018-12-02 MED ORDER — CEFAZOLIN SODIUM-DEXTROSE 2-4 GM/100ML-% IV SOLN
2.0000 g | INTRAVENOUS | Status: AC
  Administered 2018-12-02: 2 g via INTRAVENOUS
  Filled 2018-12-02: qty 100

## 2018-12-02 MED ORDER — MOMETASONE FURO-FORMOTEROL FUM 100-5 MCG/ACT IN AERO
2.0000 | INHALATION_SPRAY | Freq: Every day | RESPIRATORY_TRACT | Status: DC | PRN
  Administered 2018-12-03: 2 via RESPIRATORY_TRACT
  Filled 2018-12-02: qty 8.8

## 2018-12-02 MED ORDER — CELECOXIB 200 MG PO CAPS
200.0000 mg | ORAL_CAPSULE | Freq: Two times a day (BID) | ORAL | Status: DC
  Administered 2018-12-02 – 2018-12-03 (×2): 200 mg via ORAL
  Filled 2018-12-02 (×2): qty 1

## 2018-12-02 MED ORDER — OXYCODONE HCL 5 MG PO TABS
ORAL_TABLET | ORAL | Status: AC
  Administered 2018-12-03: 10 mg via ORAL
  Filled 2018-12-02: qty 2

## 2018-12-02 MED ORDER — POLYETHYLENE GLYCOL 3350 17 G PO PACK: 17.0000 g | PACK | Freq: Every day | ORAL | Status: DC | PRN

## 2018-12-02 MED ORDER — ONDANSETRON HCL 4 MG/2ML IJ SOLN
INTRAMUSCULAR | Status: DC | PRN
  Administered 2018-12-02: 4 mg via INTRAVENOUS

## 2018-12-02 MED ORDER — PHENOL 1.4 % MT LIQD
1.0000 | OROMUCOSAL | Status: DC | PRN
  Filled 2018-12-02: qty 177

## 2018-12-02 MED ORDER — ONDANSETRON HCL 4 MG/2ML IJ SOLN
INTRAMUSCULAR | Status: AC
  Filled 2018-12-02: qty 2

## 2018-12-02 MED ORDER — 0.9 % SODIUM CHLORIDE (POUR BTL) OPTIME
TOPICAL | Status: DC | PRN
  Administered 2018-12-02: 1000 mL

## 2018-12-02 MED ORDER — BUPIVACAINE LIPOSOME 1.3 % IJ SUSP
INTRAMUSCULAR | Status: DC | PRN
  Administered 2018-12-02: 20 mL

## 2018-12-02 MED ORDER — TRANEXAMIC ACID-NACL 1000-0.7 MG/100ML-% IV SOLN
1000.0000 mg | INTRAVENOUS | Status: AC
  Administered 2018-12-02: 1000 mg via INTRAVENOUS
  Filled 2018-12-02: qty 100

## 2018-12-02 MED ORDER — SODIUM CHLORIDE (PF) 0.9 % IJ SOLN
INTRAMUSCULAR | Status: AC
  Filled 2018-12-02: qty 20

## 2018-12-02 MED ORDER — METHOCARBAMOL 500 MG PO TABS: 500.0000 mg | ORAL_TABLET | Freq: Three times a day (TID) | ORAL | 0 refills | Status: DC | PRN

## 2018-12-02 MED ORDER — BUPIVACAINE HCL (PF) 0.75 % IJ SOLN
INTRAMUSCULAR | Status: DC | PRN
  Administered 2018-12-02: 1.8 mL via INTRATHECAL

## 2018-12-02 MED ORDER — FAMOTIDINE 20 MG PO TABS: 20.0000 mg | ORAL_TABLET | Freq: Two times a day (BID) | ORAL | 0 refills | Status: DC

## 2018-12-02 MED ORDER — SERTRALINE HCL 100 MG PO TABS
200.0000 mg | ORAL_TABLET | Freq: Every day | ORAL | Status: DC
  Administered 2018-12-03: 200 mg via ORAL
  Filled 2018-12-02: qty 2

## 2018-12-02 MED ORDER — MIDAZOLAM HCL 5 MG/5ML IJ SOLN
INTRAMUSCULAR | Status: DC | PRN
  Administered 2018-12-02: 2 mg via INTRAVENOUS

## 2018-12-02 MED ORDER — GABAPENTIN 300 MG PO CAPS
300.0000 mg | ORAL_CAPSULE | Freq: Once | ORAL | Status: AC
  Administered 2018-12-02: 300 mg via ORAL
  Filled 2018-12-02: qty 1

## 2018-12-02 MED ORDER — DIPHENHYDRAMINE HCL 12.5 MG/5ML PO ELIX: 12.5000 mg | ORAL_SOLUTION | ORAL | Status: DC | PRN

## 2018-12-02 MED ORDER — ALBUTEROL SULFATE (2.5 MG/3ML) 0.083% IN NEBU: 2.5000 mg | INHALATION_SOLUTION | Freq: Four times a day (QID) | RESPIRATORY_TRACT | Status: DC | PRN

## 2018-12-02 MED ORDER — ONDANSETRON HCL 4 MG/2ML IJ SOLN: 4.0000 mg | Freq: Four times a day (QID) | INTRAMUSCULAR | Status: DC | PRN

## 2018-12-02 MED ORDER — ONDANSETRON HCL 4 MG PO TABS: 4.0000 mg | ORAL_TABLET | Freq: Four times a day (QID) | ORAL | Status: DC | PRN

## 2018-12-02 MED ORDER — BUDESONIDE 0.5 MG/2ML IN SUSP
0.5000 mg | Freq: Two times a day (BID) | RESPIRATORY_TRACT | Status: DC
  Administered 2018-12-02: 0.5 mg via RESPIRATORY_TRACT
  Filled 2018-12-02: qty 2

## 2018-12-02 MED ORDER — ASPIRIN EC 81 MG PO TBEC: 81.0000 mg | DELAYED_RELEASE_TABLET | Freq: Two times a day (BID) | ORAL | 0 refills | Status: AC

## 2018-12-02 MED ORDER — PHENYLEPHRINE 40 MCG/ML (10ML) SYRINGE FOR IV PUSH (FOR BLOOD PRESSURE SUPPORT)
PREFILLED_SYRINGE | INTRAVENOUS | Status: AC
  Filled 2018-12-02: qty 10

## 2018-12-02 MED ORDER — ACETAMINOPHEN 325 MG PO TABS: 325.0000 mg | ORAL_TABLET | Freq: Four times a day (QID) | ORAL | Status: DC | PRN

## 2018-12-02 MED ORDER — CEFAZOLIN SODIUM-DEXTROSE 1-4 GM/50ML-% IV SOLN
1.0000 g | Freq: Four times a day (QID) | INTRAVENOUS | Status: AC
  Administered 2018-12-02 (×2): 1 g via INTRAVENOUS
  Filled 2018-12-02 (×2): qty 50

## 2018-12-02 MED ORDER — MAGNESIUM CITRATE PO SOLN: 1.0000 | Freq: Once | ORAL | Status: DC | PRN

## 2018-12-02 MED ORDER — SODIUM CHLORIDE FLUSH 0.9 % IV SOLN
INTRAVENOUS | Status: DC | PRN
  Administered 2018-12-02: 20 mL

## 2018-12-02 MED ORDER — DOCUSATE SODIUM 100 MG PO CAPS: 100.0000 mg | ORAL_CAPSULE | Freq: Two times a day (BID) | ORAL | 0 refills | Status: DC

## 2018-12-02 MED ORDER — PANTOPRAZOLE SODIUM 40 MG PO TBEC
40.0000 mg | DELAYED_RELEASE_TABLET | Freq: Every day | ORAL | Status: DC
  Administered 2018-12-02 – 2018-12-03 (×2): 40 mg via ORAL
  Filled 2018-12-02 (×2): qty 1

## 2018-12-02 MED ORDER — VENLAFAXINE HCL ER 75 MG PO CP24
75.0000 mg | ORAL_CAPSULE | Freq: Every day | ORAL | Status: DC
  Administered 2018-12-03: 75 mg via ORAL
  Filled 2018-12-02: qty 1

## 2018-12-02 MED ORDER — GABAPENTIN 300 MG PO CAPS
300.0000 mg | ORAL_CAPSULE | Freq: Three times a day (TID) | ORAL | Status: DC
  Administered 2018-12-02 – 2018-12-03 (×3): 300 mg via ORAL
  Filled 2018-12-02 (×3): qty 1

## 2018-12-02 MED ORDER — ONDANSETRON HCL 4 MG PO TABS: 4.0000 mg | ORAL_TABLET | Freq: Three times a day (TID) | ORAL | 0 refills | Status: DC | PRN

## 2018-12-02 MED ORDER — CELECOXIB 200 MG PO CAPS: 200.0000 mg | ORAL_CAPSULE | Freq: Two times a day (BID) | ORAL | 0 refills | Status: AC

## 2018-12-02 MED ORDER — DEXAMETHASONE SODIUM PHOSPHATE 10 MG/ML IJ SOLN
10.0000 mg | Freq: Once | INTRAMUSCULAR | Status: AC
  Administered 2018-12-03: 10 mg via INTRAVENOUS
  Filled 2018-12-02: qty 1

## 2018-12-02 MED ORDER — PROPOFOL 10 MG/ML IV BOLUS
INTRAVENOUS | Status: DC | PRN
  Administered 2018-12-02: 20 mg via INTRAVENOUS

## 2018-12-02 MED ORDER — ASPIRIN 81 MG PO CHEW
81.0000 mg | CHEWABLE_TABLET | Freq: Two times a day (BID) | ORAL | Status: DC
  Administered 2018-12-02 – 2018-12-03 (×2): 81 mg via ORAL
  Filled 2018-12-02 (×2): qty 1

## 2018-12-02 MED ORDER — FENTANYL CITRATE (PF) 100 MCG/2ML IJ SOLN: 25.0000 ug | INTRAMUSCULAR | Status: DC | PRN

## 2018-12-02 MED ORDER — OXYCODONE HCL 5 MG PO TABS
5.0000 mg | ORAL_TABLET | ORAL | Status: DC | PRN
  Administered 2018-12-02 – 2018-12-03 (×5): 10 mg via ORAL
  Filled 2018-12-02 (×4): qty 2

## 2018-12-02 MED ORDER — LACTATED RINGERS IV SOLN
INTRAVENOUS | Status: DC
  Administered 2018-12-02 (×2): via INTRAVENOUS

## 2018-12-02 MED ORDER — FUROSEMIDE 40 MG PO TABS: 40.0000 mg | ORAL_TABLET | Freq: Every day | ORAL | Status: DC | PRN

## 2018-12-02 MED ORDER — FENTANYL CITRATE (PF) 100 MCG/2ML IJ SOLN
INTRAMUSCULAR | Status: DC | PRN
  Administered 2018-12-02 (×2): 50 ug via INTRAVENOUS

## 2018-12-02 MED ORDER — PHENYLEPHRINE 40 MCG/ML (10ML) SYRINGE FOR IV PUSH (FOR BLOOD PRESSURE SUPPORT)
PREFILLED_SYRINGE | INTRAVENOUS | Status: DC | PRN
  Administered 2018-12-02: 100 ug via INTRAVENOUS
  Administered 2018-12-02: 80 ug via INTRAVENOUS

## 2018-12-02 MED ORDER — METHOCARBAMOL 500 MG IVPB - SIMPLE MED
500.0000 mg | Freq: Four times a day (QID) | INTRAVENOUS | Status: DC | PRN
  Filled 2018-12-02: qty 50

## 2018-12-02 MED ORDER — METOCLOPRAMIDE HCL 5 MG PO TABS: 5.0000 mg | ORAL_TABLET | Freq: Three times a day (TID) | ORAL | Status: DC | PRN

## 2018-12-02 MED ORDER — ALBUTEROL SULFATE (2.5 MG/3ML) 0.083% IN NEBU: 3.0000 mL | INHALATION_SOLUTION | RESPIRATORY_TRACT | Status: DC | PRN

## 2018-12-02 MED ORDER — FENTANYL CITRATE (PF) 100 MCG/2ML IJ SOLN
INTRAMUSCULAR | Status: AC
  Filled 2018-12-02: qty 2

## 2018-12-02 SURGICAL SUPPLY — 37 items
BLADE SAG 18X100X1.27 (BLADE) ×2 IMPLANT
BLADE SURG SZ10 CARB STEEL (BLADE) ×6 IMPLANT
CLOSURE WOUND 1/2 X4 (GAUZE/BANDAGES/DRESSINGS) ×1
COVER PERINEAL POST (MISCELLANEOUS) ×3 IMPLANT
COVER SURGICAL LIGHT HANDLE (MISCELLANEOUS) ×3 IMPLANT
COVER WAND RF STERILE (DRAPES) IMPLANT
DECANTER SPIKE VIAL GLASS SM (MISCELLANEOUS) ×6 IMPLANT
DRAPE IMP U-DRAPE 54X76 (DRAPES) ×3 IMPLANT
DRAPE STERI IOBAN 125X83 (DRAPES) ×3 IMPLANT
DRAPE U-SHAPE 47X51 STRL (DRAPES) ×6 IMPLANT
DRSG MEPILEX BORDER 4X8 (GAUZE/BANDAGES/DRESSINGS) ×3 IMPLANT
DURAPREP 26ML APPLICATOR (WOUND CARE) ×3 IMPLANT
GLOVE BIO SURGEON STRL SZ7.5 (GLOVE) ×6 IMPLANT
GLOVE BIOGEL PI IND STRL 8 (GLOVE) ×2 IMPLANT
GLOVE BIOGEL PI INDICATOR 8 (GLOVE) ×4
GOWN STRL REUS W/TWL LRG LVL3 (GOWN DISPOSABLE) ×3 IMPLANT
GOWN STRL REUS W/TWL XL LVL3 (GOWN DISPOSABLE) ×3 IMPLANT
HEAD CERAMIC FEMORAL 36MM (Head) ×2 IMPLANT
INSERT TRIDENT POLY 36MM 0DEG (Insert) ×2 IMPLANT
MANIFOLD NEPTUNE II (INSTRUMENTS) ×3 IMPLANT
NS IRRIG 1000ML POUR BTL (IV SOLUTION) ×3 IMPLANT
PACK ANTERIOR HIP CUSTOM (KITS) ×3 IMPLANT
PROTECTOR NERVE ULNAR (MISCELLANEOUS) ×3 IMPLANT
SCREW HEX LP 6.5X20 (Screw) ×2 IMPLANT
SHELL CLUSTERHOLE ACETABULAR 5 (Shell) ×2 IMPLANT
STEM HIP 127 DEG (Stem) ×2 IMPLANT
STRIP CLOSURE SKIN 1/2X4 (GAUZE/BANDAGES/DRESSINGS) ×2 IMPLANT
SUT MNCRL AB 4-0 PS2 18 (SUTURE) ×3 IMPLANT
SUT STRATAFIX 0 PDS 27 VIOLET (SUTURE) ×3
SUT VIC AB 0 CT1 36 (SUTURE) ×3 IMPLANT
SUT VIC AB 1 CT1 36 (SUTURE) ×3 IMPLANT
SUT VIC AB 2-0 CT1 27 (SUTURE) ×6
SUT VIC AB 2-0 CT1 TAPERPNT 27 (SUTURE) ×2 IMPLANT
SUTURE STRATFX 0 PDS 27 VIOLET (SUTURE) ×1 IMPLANT
TRAY FOLEY MTR SLVR 16FR STAT (SET/KITS/TRAYS/PACK) IMPLANT
WATER STERILE IRR 1000ML POUR (IV SOLUTION) ×6 IMPLANT
YANKAUER SUCT BULB TIP 10FT TU (MISCELLANEOUS) ×3 IMPLANT

## 2018-12-02 NOTE — Transfer of Care (Signed)
Immediate Anesthesia Transfer of Care Note  Patient: Nicole ScalesSelena L Thoreson  Procedure(s) Performed: TOTAL HIP ARTHROPLASTY ANTERIOR APPROACH (Left Hip)  Patient Location: PACU  Anesthesia Type:Spinal  Level of Consciousness: awake, alert , oriented and patient cooperative  Airway & Oxygen Therapy: Patient Spontanous Breathing and Patient connected to face mask  Post-op Assessment: Report given to RN and Post -op Vital signs reviewed and stable  Post vital signs: Reviewed and stable  Last Vitals:  Vitals Value Taken Time  BP    Temp    Pulse    Resp    SpO2      Last Pain:  Vitals:   12/02/18 0604  PainSc: 9       Patients Stated Pain Goal: 8 (12/02/18 0604)  Complications: No apparent anesthesia complications

## 2018-12-02 NOTE — Anesthesia Procedure Notes (Signed)
Spinal  Patient location during procedure: OR Start time: 12/02/2018 7:35 AM End time: 12/02/2018 7:38 AM Staffing Anesthesiologist: Elmer PickerWoodrum, Chelsey L, MD Resident/CRNA: Vanessa Durhamochran, Adrinne Sze Glenn, CRNA Performed: resident/CRNA  Preanesthetic Checklist Completed: patient identified, site marked, surgical consent, pre-op evaluation, timeout performed, IV checked, risks and benefits discussed and monitors and equipment checked Spinal Block Patient position: sitting Prep: DuraPrep Patient monitoring: heart rate, continuous pulse ox and blood pressure Approach: midline Location: L3-4 Injection technique: single-shot Needle Needle type: Pencan  Needle gauge: 24 G Needle length: 10 cm Needle insertion depth: 9.5 cm Assessment Sensory level: T6 Additional Notes Expiration dates checked

## 2018-12-02 NOTE — Evaluation (Signed)
Physical Therapy Evaluation Patient Details Name: Nicole Wyatt MRN: [id number] DOB: [birth year] Today's Date: 12/02/2018   History of Present Illness  LDATHA, h/o seizures  Clinical Impression  The patient ambulated x 50' Plans Dc to home, with family assisting. Pt admitted with above diagnosis. Pt currently with functional limitations due to the deficits listed below (see PT Problem List).  Pt will benefit from skilled PT to increase their independence and safety with mobility to allow discharge to the venue listed below.       Follow Up Recommendations Follow surgeon's recommendation for DC plan and follow-up therapies;Home health PT    Equipment Recommendations  Rolling walker with 5" wheels;3in1 (PT)    Recommendations for Other Services OT consult     Precautions / Restrictions Precautions Precautions: Fall      Mobility  Bed Mobility Overal bed mobility: Needs Assistance Bed Mobility: Supine to Sit     Supine to sit: Min assist;HOB elevated     General bed mobility comments: used belt to self help left leg to bed edge  Transfers Overall transfer level: Needs assistance Equipment used: Rolling walker (2 wheeled) Transfers: Sit to/from Stand Sit to Stand: Min assist         General transfer comment: cueas for hand and left leg  Ambulation/Gait Ambulation/Gait assistance: Min assist Gait Distance (Feet): 50 Feet Assistive device: Rolling walker (2 wheeled) Gait Pattern/deviations: Step-to pattern;Step-through pattern     General Gait Details: cues for sequence  Stairs            Wheelchair Mobility    Modified Rankin (Stroke Patients Only)       Balance                                             Pertinent Vitals/Pain Pain Assessment: Faces Faces Pain Scale: Hurts a little bit Pain Location: left hip Pain Descriptors / Indicators: Sore Pain Intervention(s): Monitored during session;Premedicated before  session;Repositioned    Home Living Family/patient expects to be discharged to:: Private residence Living Arrangements: Other relatives Available Help at Discharge: Family Type of Home: House Home Access: Ramped entrance     Home Layout: One level Home Equipment: None      Prior Function Level of Independence: Independent               Hand Dominance        Extremity/Trunk Assessment   Upper Extremity Assessment Upper Extremity Assessment: Defer to OT evaluation    Lower Extremity Assessment Lower Extremity Assessment: LLE deficits/detail LLE Deficits / Details: able to advance the leg    Cervical / Trunk Assessment Cervical / Trunk Assessment: Normal  Communication   Communication: No difficulties  Cognition Arousal/Alertness: Awake/alert Behavior During Therapy: WFL for tasks assessed/performed Overall Cognitive Status: Within Functional Limits for tasks assessed                                        General Comments      Exercises     Assessment/Plan    PT Assessment Patient needs continued PT services  PT Problem List Decreased strength;Decreased range of motion;Decreased knowledge of use of DME;Decreased activity tolerance;Decreased safety awareness;Decreased knowledge of precautions;Decreased mobility;Pain       PT Treatment Interventions DME instruction;Gait  training;Functional mobility training;Therapeutic activities;Therapeutic exercise;Patient/family education    PT Goals (Current goals can be found in the Care Plan section)  Acute Rehab PT Goals Patient Stated Goal: go on a trip PT Goal Formulation: With patient/family Time For Goal Achievement: 12/09/18 Potential to Achieve Goals: Good    Frequency 7X/week   Barriers to discharge        Co-evaluation               AM-PAC PT "6 Clicks" Mobility  Outcome Measure Help needed turning from your back to your side while in a flat bed without using bedrails?: A  Lot Help needed moving from lying on your back to sitting on the side of a flat bed without using bedrails?: A Lot Help needed moving to and from a bed to a chair (including a wheelchair)?: A Lot Help needed standing up from a chair using your arms (e.g., wheelchair or bedside chair)?: A Lot Help needed to walk in hospital room?: A Lot Help needed climbing 3-5 steps with a railing? : Total 6 Click Score: 11    End of Session   Activity Tolerance: Patient tolerated treatment well Patient left: in chair;with call bell/phone within reach;with family/visitor present Nurse Communication: Mobility status PT Visit Diagnosis: Unsteadiness on feet (R26.81)    Time: [id number] PT Time Calculation (min) (ACUTE ONLY): 18 min   Charges:   PT Evaluation $PT Eval Low Complexity: 1 Low          Blanchard KelchKaren Bracy Pepper PT Acute Rehabilitation Services Pager [phone] Office [phone]   Rada HayHill, Dejia Ebron Elizabeth 12/02/2018, 5:24 PM

## 2018-12-02 NOTE — Anesthesia Postprocedure Evaluation (Signed)
Anesthesia Post Note  Patient: Kinnie ScalesSelena L Benassi  Procedure(s) Performed: TOTAL HIP ARTHROPLASTY ANTERIOR APPROACH (Left Hip)     Patient location during evaluation: PACU Anesthesia Type: Spinal Level of consciousness: oriented and awake and alert Pain management: pain level controlled Vital Signs Assessment: post-procedure vital signs reviewed and stable Respiratory status: spontaneous breathing, respiratory function stable and patient connected to nasal cannula oxygen Cardiovascular status: blood pressure returned to baseline and stable Postop Assessment: no headache, no backache and no apparent nausea or vomiting Anesthetic complications: no    Last Vitals:  Vitals:   12/02/18 1110 12/02/18 1200  BP: 118/62 126/66  Pulse: (!) 55 (!) 58  Resp: 20 18  Temp: 36.7 C 36.6 C  SpO2: 98% 96%    Last Pain:  Vitals:   12/02/18 1150  PainSc: 8                  Ainslie Mazurek L Conswella Bruney

## 2018-12-02 NOTE — Op Note (Signed)
12/02/2018  11:52 AM  PATIENT:  Nicole Wyatt   MRN: [id number]  PRE-OPERATIVE DIAGNOSIS:  OA LEFT HIP  POST-OPERATIVE DIAGNOSIS:  OA LEFT HIP  PROCEDURE:  Procedure(s): TOTAL HIP ARTHROPLASTY ANTERIOR APPROACH  PREOPERATIVE INDICATIONS:    Nicole Wyatt is an 52 y.o. female who has a diagnosis of Primary osteoarthritis of left hip and elected for surgical management after failing conservative treatment.  The risks benefits and alternatives were discussed with the patient including but not limited to the risks of nonoperative treatment, versus surgical intervention including infection, bleeding, nerve injury, periprosthetic fracture, the need for revision surgery, dislocation, leg length discrepancy, blood clots, cardiopulmonary complications, morbidity, mortality, among others, and they were willing to proceed.     OPERATIVE REPORT     SURGEON:   Renette Butters, MD    ASSISTANT:  Roxan Hockey, PA-C, he was present and scrubbed throughout the case, critical for completion in a timely fashion, and for retraction, instrumentation, and closure.     ANESTHESIA:  General    COMPLICATIONS:  None.     COMPONENTS:  Stryker acolade fit femur size 5 with a 36 mm -0 head ball and a PSL acetabular shell size 52 with a  polyethylene liner    PROCEDURE IN DETAIL:   The patient was met in the holding area and  identified.  The appropriate hip was identified and marked at the operative site.  The patient was then transported to the OR  and  placed under anesthesia per that record.  At that point, the patient was  placed in the supine position and  secured to the operating room table and all bony prominences padded. He received pre-operative antibiotics    The operative lower extremity was prepped from the iliac crest to the distal leg.  Sterile draping was performed.  Time out was performed prior to incision.      Skin incision was made just 2 cm lateral to the ASIS  extending in line  with the tensor fascia lata. Electrocautery was used to control all bleeders. I dissected down sharply to the fascia of the tensor fascia lata was confirmed that the muscle fibers beneath were running posteriorly. I then incised the fascia over the superficial tensor fascia lata in line with the incision. The fascia was elevated off the anterior aspect of the muscle the muscle was retracted posteriorly and protected throughout the case. I then used electrocautery to incise the tensor fascia lata fascia control and all bleeders. Immediately visible was the fat over top of the anterior neck and capsule.  I removed the anterior fat from the capsule and elevated the rectus muscle off of the anterior capsule. I then removed a large time of capsule. The retractors were then placed over the anterior acetabulum as well as around the superior and inferior neck.  I then removed a section of the femoral neck and a napkin ring fashion. Then used the power course to remove the femoral head from the acetabulum and thoroughly irrigated the acetabulum. I sized the femoral head.    I then exposed the deep acetabulum, cleared out any tissue including the ligamentum teres.   After adequate visualization, I excised the labrum, and then sequentially reamed.  I then impacted the acetabular implant into place using fluoroscopy for guidance.  Appropriate version and inclination was confirmed clinically matching their bony anatomy, and with fluoroscopy.  I placed a 20 mm screw in the posterior/superio position with an excellent bite.  I then placed the polyethylene liner in place  I then adducted the leg and released the external rotators from the posterior femur allowing it to be easily delivered up lateral and anterior to the acetabulum for preparation of the femoral canal.    I then prepared the proximal femur using the cookie-cutter and then sequentially reamed and broached.  A trial broach, neck, and head was  utilized, and I reduced the hip and used floroscopy to assess the neck length and femoral implant.  I then impacted the femoral prosthesis into place into the appropriate version. The hip was then reduced and fluoroscopy confirmed appropriate position. Leg lengths were restored.  I then irrigated the hip copiously again with, and repaired the fascia with Vicryl, followed by monocryl for the subcutaneous tissue, Monocryl for the skin, Steri-Strips and sterile gauze. The patient was then awakened and returned to PACU in stable and satisfactory condition. There were no complications.  POST OPERATIVE PLAN: WBAT, DVT px: SCD's/TED, ambulation and chemical dvt px  Sheritta Deeg, MD Orthopedic Surgeon [phone]     

## 2018-12-02 NOTE — Care Plan (Signed)
Ortho Bundle Case Management Note  Patient Details  Name: Nicole Wyatt MRN: [id number] Date of Birth: [birth year]   [facility]. I spoke with  Amy at [phone]. The fax number is [phone]. Please fax the discharge information and orders post operatively to them. [facility] was not able to cover her address.                 DME Arranged:  Dan HumphreysWalker rolling DME Agency:  [employer]  HH Arranged:  PT HH Agency:  [facility]  Additional Comments: Please contact me with any questions of if this plan should need to change.  Shauna Hughenee Angiulli,  RN,BSN,MHA,CCM  [facility]  [phone] 12/02/2018, 11:24 AM

## 2018-12-02 NOTE — Interval H&P Note (Signed)
I participated in the care of this patient and agree with the above history, physical and evaluation. I performed a review of the history and a physical exam as detailed  I just discussed with patient her recent history of a stone in her ureter.  Since her visit to the emergency room she has not had any pain she has had no systemic symptoms her white count was normal she has been afebrile.  I discussed with her that a infection here would raise her risk of infection of her total joint given her lack of other symptoms are normal white count I think this risk is a minimal elevation she understands this and would still like to go forward with her total hip.   Laqueta Dueimothy Daniel Froylan Hobby MD

## 2018-12-02 NOTE — Discharge Instructions (Signed)
You may bear weight as tolerated. °Keep your dressing on and dry until follow up. °Take medicine to prevent blood clots as directed. °Take pain medicine as needed with the goal of transitioning to over the counter medicines.  °If needed, you may increase pain medication for the first few days post op - up to 2 tablets every 4 hours.  Stop as needed pain medication as soon as you are able. ° °INSTRUCTIONS AFTER JOINT REPLACEMENT  ° °o Remove items at home which could result in a fall. This includes throw rugs or furniture in walking pathways °o ICE to the affected joint every three hours while awake for 30 minutes at a time, for at least the first 3-5 days, and then as needed for pain and swelling.  Continue to use ice for pain and swelling. You may notice swelling that will progress down to the foot and ankle.  This is normal after surgery.  Elevate your leg when you are not up walking on it.   °o Continue to use the breathing machine you got in the hospital (incentive spirometer) which will help keep your temperature down.  It is common for your temperature to cycle up and down following surgery, especially at night when you are not up moving around and exerting yourself.  The breathing machine keeps your lungs expanded and your temperature down. ° ° °DIET:  As you were doing prior to hospitalization, we recommend a well-balanced diet. ° °DRESSING / WOUND CARE / SHOWERING ° °You may shower 3 days after surgery, but keep the wounds dry during showering.  You may use an occlusive plastic wrap (Press'n Seal for example) with blue painter's tape at edges, NO SOAKING/SUBMERGING IN THE BATHTUB.  If the bandage gets wet, change with a clean dry gauze.  If the incision gets wet, pat the wound dry with a clean towel. ° °ACTIVITY ° °o Increase activity slowly as tolerated, but follow the weight bearing instructions below.   °o No driving for 6 weeks or until further direction given by your physician.  You cannot drive while  taking narcotics.  °o No lifting or carrying greater than 10 lbs. until further directed by your surgeon. °o Avoid periods of inactivity such as sitting longer than an hour when not asleep. This helps prevent blood clots.  °o You may return to work once you are authorized by your doctor.  ° ° ° °WEIGHT BEARING  ° °Weight bearing as tolerated with assist device (walker, cane, etc) as directed, use it as long as suggested by your surgeon or therapist, typically at least 4-6 weeks. ° ° °EXERCISES ° °Results after joint replacement surgery are often greatly improved when you follow the exercise, range of motion and muscle strengthening exercises prescribed by your doctor. Safety measures are also important to protect the joint from further injury. Any time any of these exercises cause you to have increased pain or swelling, decrease what you are doing until you are comfortable again and then slowly increase them. If you have problems or questions, call your caregiver or physical therapist for advice.  ° °Rehabilitation is important following a joint replacement. After just a few days of immobilization, the muscles of the leg can become weakened and shrink (atrophy).  These exercises are designed to build up the tone and strength of the thigh and leg muscles and to improve motion. Often times heat used for twenty to thirty minutes before working out will loosen up your tissues and help with   improving the range of motion but do not use heat for the first two weeks following surgery (sometimes heat can increase post-operative swelling).  ° °These exercises can be done on a training (exercise) mat, on the floor, on a table or on a bed. Use whatever works the best and is most comfortable for you.    Use music or television while you are exercising so that the exercises are a pleasant break in your day. This will make your life better with the exercises acting as a break in your routine that you can look forward to.   Perform  all exercises about fifteen times, three times per day or as directed.  You should exercise both the operative leg and the other leg as well. ° °Exercises include: °  °• Quad Sets - Tighten up the muscle on the front of the thigh (Quad) and hold for 5-10 seconds.   °• Straight Leg Raises - With your knee straight (if you were given a brace, keep it on), lift the leg to 60 degrees, hold for 3 seconds, and slowly lower the leg.  Perform this exercise against resistance later as your leg gets stronger.  °• Leg Slides: Lying on your back, slowly slide your foot toward your buttocks, bending your knee up off the floor (only go as far as is comfortable). Then slowly slide your foot back down until your leg is flat on the floor again.  °• Angel Wings: Lying on your back spread your legs to the side as far apart as you can without causing discomfort.  °• Hamstring Strength:  Lying on your back, push your heel against the floor with your leg straight by tightening up the muscles of your buttocks.  Repeat, but this time bend your knee to a comfortable angle, and push your heel against the floor.  You may put a pillow under the heel to make it more comfortable if necessary.  ° °A rehabilitation program following joint replacement surgery can speed recovery and prevent re-injury in the future due to weakened muscles. Contact your doctor or a physical therapist for more information on knee rehabilitation.  ° ° °CONSTIPATION ° °Constipation is defined medically as fewer than three stools per week and severe constipation as less than one stool per week.  Even if you have a regular bowel pattern at home, your normal regimen is likely to be disrupted due to multiple reasons following surgery.  Combination of anesthesia, postoperative narcotics, change in appetite and fluid intake all can affect your bowels.  ° °YOU MUST use at least one of the following options; they are listed in order of increasing strength to get the job done.   They are all available over the counter, and you may need to use some, POSSIBLY even all of these options:   ° °Drink plenty of fluids (prune juice may be helpful) and high fiber foods °Colace 100 mg by mouth twice a day  °Senokot for constipation as directed and as needed Dulcolax (bisacodyl), take with full glass of water  °Miralax (polyethylene glycol) once or twice a day as needed. ° °If you have tried all these things and are unable to have a bowel movement in the first 3-4 days after surgery call either your surgeon or your primary doctor.   ° °If you experience loose stools or diarrhea, hold the medications until you stool forms back up.  If your symptoms do not get better within 1 week or if they get worse,   check with your doctor.  If you experience "the worst abdominal pain ever" or develop nausea or vomiting, please contact the office immediately for further recommendations for treatment. ° ° °ITCHING:  If you experience itching with your medications, try taking only a single pain pill, or even half a pain pill at a time.  You can also use Benadryl over the counter for itching or also to help with sleep.  ° °TED HOSE STOCKINGS:  Use stockings on both legs until for at least 2 weeks or as directed by physician office. They may be removed at night for sleeping. ° °MEDICATIONS:  See your medication summary on the “After Visit Summary” that nursing will review with you.  You may have some home medications which will be placed on hold until you complete the course of blood thinner medication.  It is important for you to complete the blood thinner medication as prescribed. ° °PRECAUTIONS:  If you experience chest pain or shortness of breath - call 911 immediately for transfer to the hospital emergency department.  ° °If you develop a fever greater that 101 F, purulent drainage from wound, increased redness or drainage from wound, foul odor from the wound/dressing, or calf pain - CONTACT YOUR SURGEON.   °                                                 °FOLLOW-UP APPOINTMENTS:  If you do not already have a post-op appointment, please call the office for an appointment to be seen by your surgeon.  Guidelines for how soon to be seen are listed in your “After Visit Summary”, but are typically between 1-4 weeks after surgery. ° °OTHER INSTRUCTIONS:  ° ° ° °MAKE SURE YOU:  °• Understand these instructions.  °• Get help right away if you are not doing well or get worse.  ° ° °Thank you for letting us be a part of your medical care team.  It is a privilege we respect greatly.  We hope these instructions will help you stay on track for a fast and full recovery!  ° ° ° ° °

## 2018-12-02 NOTE — Plan of Care (Signed)
Plan of care discussed.   

## 2018-12-02 NOTE — Plan of Care (Signed)
Nicole EasternLan of care

## 2018-12-03 ENCOUNTER — Encounter (HOSPITAL_COMMUNITY): Payer: Self-pay | Admitting: Orthopedic Surgery

## 2018-12-03 NOTE — Progress Notes (Signed)
    Subjective: Patient reports pain as mild to moderate, controlled.  Tolerating diet.  Urinating.  No CP, SOB.  Good early mobilization with therapy.  Objective:   VITALS:   Vitals:   12/02/18 2156 12/02/18 2158 12/03/18 0137 12/03/18 0556  BP: 132/71  113/65 105/67  Pulse: 61  65 62  Resp: 18  14 16   Temp:  98.3 F (36.8 C) 98.1 F (36.7 C) 97.8 F (36.6 C)  TempSrc:  Axillary Axillary Oral  SpO2: 100%  99% 100%  Weight:      Height:       CBC Latest Ref Rng & Units 11/13/2018 10/28/2018 09/23/2018  WBC 4.0 - 10.5 K/uL 9.7 7.7 5.1  Hemoglobin 12.0 - 15.0 g/dL 15.2(H) 14.8 12.7  Hematocrit 36.0 - 46.0 % 46.0 45.2 38.9  Platelets 150 - 400 K/uL 265 302 251   BMP Latest Ref Rng & Units 11/13/2018 10/28/2018 09/23/2018  Glucose 70 - 99 mg/dL 161(W121(H) 89 90  BUN 6 - 20 mg/dL 18 18 18   Creatinine 0.44 - 1.00 mg/dL 9.600.85 4.540.66 0.980.67  BUN/Creat Ratio 9 - 23 - - -  Sodium 135 - 145 mmol/L 140 143 141  Potassium 3.5 - 5.1 mmol/L 3.8 3.5 4.4  Chloride 98 - 111 mmol/L 104 108 108  CO2 22 - 32 mmol/L 27 27 28   Calcium 8.9 - 10.3 mg/dL 9.1 9.3 9.0   Intake/Output      12/10 0701 - 12/11 0700 12/11 0701 - 12/12 0700   P.O. 360    I.V. (mL/kg) 3422.6 (29.8)    IV Piggyback 300    Total Intake(mL/kg) 4082.6 (35.5)    Urine (mL/kg/hr) 2200 (0.8)    Blood 300    Total Output 2500    Net +1582.6           Physical Exam: General: NAD.  Supine in bed on arrival.  Calm, conversant.  Family and nurse at bedside. Resp: No increased wob Cardio: regular rate and rhythm ABD soft Neurologically intact MSK LLE: Neurovascularly intact Sensation intact distally Intact pulses distally Dorsiflexion/Plantar flexion intact Incision: dressing C/D/I   Assessment: 1 Day Post-Op  S/P Procedure(s) (LRB): TOTAL HIP ARTHROPLASTY ANTERIOR APPROACH (Left) by Dr. Jewel Baizeimothy D. Eulah PontMurphy on 12/02/2018  Principal Problem:   Primary osteoarthritis of left hip Active Problems:   Depression, major,  recurrent, moderate (HCC)   OSA (obstructive sleep apnea)   Pseudoseizures   Primary osteoarthritis of hip   Primary osteoarthritis, status post total hip arthroplasty Doing well postop day 1 Eating, drinking, voiding Pain controlled Good early mobilization  Plan: Up with therapy Incentive Spirometry Apply ice as needed   Weight Bearing: Weight Bearing as Tolerated (WBAT) LLE Dressings: Maintain Mepilex.   VTE prophylaxis: Aspirin, SCDs, ambulation Dispo: Home today after a.m. therapy   Albina BilletHenry Calvin Martensen III, PA-C 12/03/2018, 7:39 AM

## 2018-12-03 NOTE — Progress Notes (Signed)
Physical Therapy Treatment Patient Details Name: Nicole ScalesSelena L Wyatt MRN: [id number] DOB: 22-Mar-1966 Today's Date: 12/03/2018    History of Present Illness  L DATHA    PT Comments    The patient is progressing well. Plans Dc today w/ HHPT.  Follow Up Recommendations  Follow surgeon's recommendation for DC plan and follow-up therapies;Home health PT     Equipment Recommendations  Rolling walker with 5" wheels;3in1 (PT)    Recommendations for Other Services       Precautions / Restrictions Precautions Precautions: Fall    Mobility  Bed Mobility         Supine to sit: Min assist     General bed mobility comments: used gait belt.    Transfers   Equipment used: Rolling walker (2 wheeled) Transfers: Sit to/from Stand Sit to Stand: [employer] guard         General transfer comment: for safety  Ambulation/Gait Ambulation/Gait assistance: Min Chemical engineerassist Gait Distance (Feet): 160 Feet   Gait Pattern/deviations: Step-to pattern;Step-through pattern     General Gait Details: cues for sequence   Stairs             Wheelchair Mobility    Modified Rankin (Stroke Patients Only)       Balance                                            Cognition Arousal/Alertness: Awake/alert                                            Exercises Total Joint Exercises Ankle Circles/Pumps: AROM;Both;10 reps Quad Sets: AROM;Both Short Arc Quad: AROM;Left;5 reps Heel Slides: AAROM;Left Hip ABduction/ADduction: AAROM;Left;5 reps Long Arc Quad: AAROM;Left;5 reps    General Comments        Pertinent Vitals/Pain Faces Pain Scale: Hurts a little bit Pain Location: left hip Pain Descriptors / Indicators: Sore Pain Intervention(s): Monitored during session;Premedicated before session;Ice applied    Home Living                      Prior Function            PT Goals (current goals can now be found in the care plan section)  Progress towards PT goals: Progressing toward goals    Frequency    7X/week      PT Plan Current plan remains appropriate    Co-evaluation              AM-PAC PT "6 Clicks" Mobility   Outcome Measure  Help needed turning from your back to your side while in a flat bed without using bedrails?: A Little Help needed moving from lying on your back to sitting on the side of a flat bed without using bedrails?: A Little Help needed moving to and from a bed to a chair (including a wheelchair)?: A Little Help needed standing up from a chair using your arms (e.g., wheelchair or bedside chair)?: A Little Help needed to walk in hospital room?: A Little Help needed climbing 3-5 steps with a railing? : Total 6 Click Score: 16    End of Session   Activity Tolerance: Patient tolerated treatment well Patient left: in chair;with family/visitor present Nurse Communication: Mobility status PT  Visit Diagnosis: Unsteadiness on feet (R26.81)     Time: 1610-9604 PT Time Calculation (min) (ACUTE ONLY): 42 min  Charges:  $Gait Training: 8-22 mins $Therapeutic Exercise: 8-22 mins $Self Care/Home Management: 8-22                        Rada Hay 12/03/2018, 1:45 PM

## 2018-12-03 NOTE — Progress Notes (Deleted)
Discharge Summary  Patient ID: Nicole Wyatt MRN: [id number] DOB/AGE: [birth year] 54 y.o.  Admit date: 12/02/2018 Discharge date: 12/03/2018  Admission Diagnoses:  Primary osteoarthritis of left hip  Discharge Diagnoses:  Principal Problem:   Primary osteoarthritis of left hip Active Problems:   Depression, major, recurrent, moderate (HCC)   OSA (obstructive sleep apnea)   Pseudoseizures   Primary osteoarthritis of hip   Past Medical History:  Diagnosis Date  . Abnormal Pap smear of cervix   . Anxiety   . Arthritis   . Bipolar 1 disorder (HCC)   . COPD (chronic obstructive pulmonary disease) (HCC)   . Depression   . Dyspnea    with exertion   . Kidney stone   . Oxygen dependent    patient uses 2L oz PRN ; reports hardly ever uses it unless very SOB    . [place] spotted fever 2018  . Sarcoidosis of lung (HCC)    managed by Dr Lindie Spruce   . Seizures (HCC) last seizure 05/2013  . Sleep apnea    uses CPAP nightly   . UTI (urinary tract infection) 11/13/2018   see ED visit in epic ; was dc'd with oral abx and reports completed and relief of sx     Surgeries: Procedure(s): TOTAL HIP ARTHROPLASTY ANTERIOR APPROACH on 12/02/2018   Consultants (if any):   Discharged Condition: Improved  Hospital Course: Nicole Wyatt is an 52 y.o. female who was admitted 12/02/2018 with a diagnosis of Primary osteoarthritis of left hip and went to the operating room on 12/02/2018 and underwent the above named procedures.    She was given perioperative antibiotics:  Anti-infectives (From admission, onward)   Start     Dose/Rate Route Frequency Ordered Stop   12/02/18 1400  ceFAZolin (ANCEF) IVPB 1 g/50 mL premix     1 g 100 mL/hr over 30 Minutes Intravenous Every 6 hours 12/02/18 1324 12/02/18 2048   12/02/18 0600  ceFAZolin (ANCEF) IVPB 2g/100 mL premix     2 g 200 mL/hr over 30 Minutes Intravenous On call to O.R. 12/02/18 6578 12/02/18 0743    .  She was given sequential  compression devices, early ambulation, and aspirin for DVT prophylaxis.  She benefited maximally from the hospital stay and there were no complications.    Recent vital signs:  Vitals:   12/03/18 0137 12/03/18 0556  BP: 113/65 105/67  Pulse: 65 62  Resp: 14 16  Temp: 98.1 F (36.7 C) 97.8 F (36.6 C)  SpO2: 99% 100%    Recent laboratory studies:  Lab Results  Component Value Date   HGB 15.2 (H) 11/13/2018   HGB 14.8 10/28/2018   HGB 12.7 09/23/2018   Lab Results  Component Value Date   WBC 9.7 11/13/2018   PLT 265 11/13/2018   Lab Results  Component Value Date   INR 1.0 06/20/2014   Lab Results  Component Value Date   NA 140 11/13/2018   K 3.8 11/13/2018   CL 104 11/13/2018   CO2 27 11/13/2018   BUN 18 11/13/2018   CREATININE 0.85 11/13/2018   GLUCOSE 121 (H) 11/13/2018    Discharge Medications:   Allergies as of 12/03/2018      Reactions   Bee Venom Anaphylaxis   Ciprofloxacin Nausea Only   Penicillins Rash, Other (See Comments)   Has patient had a PCN reaction causing immediate rash, facial/tongue/throat swelling, SOB or lightheadedness with hypotension: no Has patient had a PCN reaction causing severe rash  involving mucus membranes or skin necrosis: no Has patient had a PCN reaction that required hospitalization no Has patient had a PCN reaction occurring within the last 10 years: {yes If all of the above answers are "NO", then may proceed with Cephalosporin use.      Medication List    STOP taking these medications   ibuprofen 600 MG tablet Commonly known as:  ADVIL,MOTRIN   meloxicam 15 MG tablet Commonly known as:  MOBIC     TAKE these medications   acetaminophen 500 MG tablet Commonly known as:  TYLENOL Take 2 tablets (1,000 mg total) by mouth every 8 (eight) hours for 14 days. For Pain. What changed:    when to take this  reasons to take this  additional instructions   albuterol 108 (90 Base) MCG/ACT inhaler Commonly known as:   PROVENTIL HFA;VENTOLIN HFA Inhale 2 puffs into the lungs every 4 (four) hours as needed for wheezing or shortness of breath.   albuterol (2.5 MG/3ML) 0.083% nebulizer solution Commonly known as:  PROVENTIL Take 3 mLs (2.5 mg total) by nebulization every 6 (six) hours as needed for wheezing or shortness of breath.   aspirin EC 81 MG tablet Take 1 tablet (81 mg total) by mouth 2 (two) times daily. For DVT prophylaxis for 30 days after surgery. What changed:    when to take this  additional instructions   celecoxib 200 MG capsule Commonly known as:  CELEBREX Take 1 capsule (200 mg total) by mouth 2 (two) times daily for 14 days. For 2 weeks post op for pain and inflammation.  Discontinue Ibuprofen, Meloxicam, or other Anti-inflammatory medicine when taking this medicine.   docusate sodium 100 MG capsule Commonly known as:  COLACE Take 1 capsule (100 mg total) by mouth 2 (two) times daily. To prevent constipation while taking pain medication.   famotidine 20 MG tablet Commonly known as:  PEPCID Take 1 tablet (20 mg total) by mouth 2 (two) times daily. For Gastroprotection   furosemide 40 MG tablet Commonly known as:  LASIX Take 1 tablet (40 mg total) by mouth daily. What changed:    when to take this  reasons to take this   gabapentin 300 MG capsule Commonly known as:  NEURONTIN Take 1 capsule (300 mg total) by mouth 3 (three) times daily for 14 days. For 2 weeks post op for pain.   methocarbamol 500 MG tablet Commonly known as:  ROBAXIN Take 1 tablet (500 mg total) by mouth every 8 (eight) hours as needed for muscle spasms.   mometasone 220 MCG/INH inhaler Commonly known as:  ASMANEX Inhale 2 puffs into the lungs daily as needed (for shortness of breath or wheezing).   mometasone-formoterol 100-5 MCG/ACT Aero Commonly known as:  DULERA Inhale 2 puffs into the lungs daily as needed for wheezing or shortness of breath.   ondansetron 4 MG tablet Commonly known as:   ZOFRAN Take 1 tablet (4 mg total) by mouth every 8 (eight) hours as needed for nausea or vomiting.   oxyCODONE 5 MG immediate release tablet Commonly known as:  Oxy IR/ROXICODONE Take 1 tablet (5 mg total) by mouth every 4 (four) hours as needed for breakthrough pain.   OXYGEN Inhale 2 L into the lungs continuous.   sertraline 100 MG tablet Commonly known as:  ZOLOFT Take 2 tablets (200 mg total) by mouth daily.   tamsulosin 0.4 MG Caps capsule Commonly known as:  FLOMAX Take 1 capsule (0.4 mg total) by mouth daily.  venlafaxine XR 75 MG 24 hr capsule Commonly known as:  EFFEXOR-XR Take 1 capsule (75 mg total) by mouth daily with breakfast.       Diagnostic Studies: Dg C-arm 1-60 Min-no Report  Result Date: 12/02/2018 Fluoroscopy was utilized by the requesting physician.  No radiographic interpretation.   Ct Renal Stone Study  Result Date: 11/13/2018 CLINICAL DATA:  Continued left flank pain. EXAM: CT ABDOMEN AND PELVIS WITHOUT CONTRAST TECHNIQUE: Multidetector CT imaging of the abdomen and pelvis was performed following the standard protocol without IV contrast. COMPARISON:  None. FINDINGS: Lower chest: No acute abnormality. Hepatobiliary: The liver was not imaged in its entirety. Visualized portions of the liver and gallbladder are normal. Pancreas: Unremarkable. No pancreatic ductal dilatation or surrounding inflammatory changes. Spleen: The spleen was not imaged in its entirety. Visualized portions of the spleen are normal. Adrenals/Urinary Tract: A right adrenal adenoma is stable. The left adrenal gland is normal. No definitive right renal stones. No right-sided hydronephrosis or perinephric stranding. The right ureter is normal. There is a punctate stone in the upper pole of the left kidney on coronal image 77. The previously identified stone in the left kidney has migrated into the proximal ureter measuring 7 mm on coronal image 65. There is resulting new hydronephrosis and  perinephric stranding on the left. The left ureter is dilated to the level of the stone. Beyond the level of the stone, the left ureter is decompressed. The bladder is normal. Stomach/Bowel: The stomach is not seen in its entirety. Visualized portions of the stomach and small bowel are normal. Visualized colon and appendix are normal. Vascular/Lymphatic: No significant vascular findings are present. No enlarged abdominal or pelvic lymph nodes. Reproductive: Status post hysterectomy. No adnexal masses. Other: No abdominal wall hernia or abnormality. No abdominopelvic ascites. Musculoskeletal: Severe degenerative changes in the hips. No acute bony abnormalities. IMPRESSION: 1. There is a 7 mm stone in the proximal left ureter resulting in new hydronephrosis and perinephric stranding. A punctate stone is seen in the upper pole of the left kidney as well. 2. Right adrenal adenoma, unchanged. 3. Severe degenerative changes in the hips, left greater than right. Electronically Signed   By: Gerome Sam III M.D   On: 11/13/2018 23:26   Dg Hip Operative Unilat W Or W/o Pelvis Left  Result Date: 12/02/2018 CLINICAL DATA:  Postop left hip replacement EXAM: OPERATIVE left HIP (WITH PELVIS IF PERFORMED) two VIEWS TECHNIQUE: Fluoroscopic spot image(s) were submitted for interpretation post-operatively. COMPARISON:  Left hip films of 11/09/2018 FINDINGS: The 2 C-arm spot films obtained show the acetabular and femoral components of the left hip replacement to be in good position. No complicating features are seen. IMPRESSION: Left hip replacement components in good position. No complicating features. Electronically Signed   By: Dwyane Dee M.D.   On: 12/02/2018 09:32   Dg Hip Unilat With Pelvis 2-3 Views Left  Result Date: 11/09/2018 CLINICAL DATA:  Worsening left hip pain. EXAM: DG HIP (WITH OR WITHOUT PELVIS) 2-3V LEFT COMPARISON:  None. FINDINGS: Severe degenerative changes in the left hip with complete joint space  loss. Extensive subchondral sclerosis and cyst formation. Moderate degenerative changes in the right hip. No acute bony abnormality. Specifically, no fracture, subluxation, or dislocation. IMPRESSION: Severe left and moderate right degenerative hip changes. No acute bony abnormality. Electronically Signed   By: Charlett Nose M.D.   On: 11/09/2018 20:18    Disposition: Discharge disposition: 01-Home or Self Care       Discharge  Instructions    Discharge patient   Complete by:  As directed    After A.M. Therapy.   Discharge disposition:  01-Home or Self Care   Discharge patient date:  12/03/2018      Follow-up Information    Sheral ApleyMurphy, Timothy D, MD. Go on 12/15/2018.   Specialty:  Orthopedic Surgery Why:  Your appointment has been set for 1245 Contact information: [address] [place] [address] [phone]        [facility] Follow [facility].   Why:  Home Health Physical Therapy will come out to see you for five visit prior to your follow up with Dr. Adora FridgeMurphy Contact information: [phone]           Signed: Albina BilletHenry Calvin Martensen III PA-C 12/03/2018, 7:43 AM

## 2018-12-03 NOTE — Discharge Summary (Signed)
Discharge Summary  Patient ID: Nicole Wyatt MRN: [id number] DOB/AGE: [birth year] 52 y.o.  Admit date: 12/02/2018 Discharge date: 12/03/2018  Admission Diagnoses:  Primary osteoarthritis of left hip  Discharge Diagnoses:  Principal Problem:   Primary osteoarthritis of left hip Active Problems:   Depression, major, recurrent, moderate (HCC)   OSA (obstructive sleep apnea)   Pseudoseizures   Primary osteoarthritis of hip   Past Medical History:  Diagnosis Date  . Abnormal Pap smear of cervix   . Anxiety   . Arthritis   . Bipolar 1 disorder (HCC)   . COPD (chronic obstructive pulmonary disease) (HCC)   . Depression   . Dyspnea    with exertion   . Kidney stone   . Oxygen dependent    patient uses 2L oz PRN ; reports hardly ever uses it unless very SOB    . [place] spotted fever 2018  . Sarcoidosis of lung (HCC)    managed by Dr Lindie Spruce   . Seizures (HCC) last seizure 05/2013  . Sleep apnea    uses CPAP nightly   . UTI (urinary tract infection) 11/13/2018   see ED visit in epic ; was dc'd with oral abx and reports completed and relief of sx     Surgeries: Procedure(s): TOTAL HIP ARTHROPLASTY ANTERIOR APPROACH on 12/02/2018   Consultants (if any):   Discharged Condition: Improved  Hospital Course: Nicole Wyatt is an 52 y.o. female who was admitted 12/02/2018 with a diagnosis of Primary osteoarthritis of left hip and went to the operating room on 12/02/2018 and underwent the above named procedures.    She was given perioperative antibiotics:  Anti-infectives (From admission, onward)   Start     Dose/Rate Route Frequency Ordered Stop   12/02/18 1400  ceFAZolin (ANCEF) IVPB 1 g/50 mL premix     1 g 100 mL/hr over 30 Minutes Intravenous Every 6 hours 12/02/18 1324 12/02/18 2048   12/02/18 0600  ceFAZolin (ANCEF) IVPB 2g/100 mL premix     2 g 200 mL/hr over 30 Minutes Intravenous On call to O.R. 12/02/18 4098 12/02/18 0743    .  She was given sequential  compression devices, early ambulation, and Aspirin for DVT prophylaxis.  She benefited maximally from the hospital stay and there were no complications.    Recent vital signs:  Vitals:   12/03/18 0926 12/03/18 0935  BP:  121/62  Pulse:  (!) 57  Resp:  16  Temp:  97.8 F (36.6 C)  SpO2: 95% 94%    Recent laboratory studies:  Lab Results  Component Value Date   HGB 15.2 (H) 11/13/2018   HGB 14.8 10/28/2018   HGB 12.7 09/23/2018   Lab Results  Component Value Date   WBC 9.7 11/13/2018   PLT 265 11/13/2018   Lab Results  Component Value Date   INR 1.0 06/20/2014   Lab Results  Component Value Date   NA 140 11/13/2018   K 3.8 11/13/2018   CL 104 11/13/2018   CO2 27 11/13/2018   BUN 18 11/13/2018   CREATININE 0.85 11/13/2018   GLUCOSE 121 (H) 11/13/2018    Discharge Medications:   Allergies as of 12/03/2018      Reactions   Bee Venom Anaphylaxis   Ciprofloxacin Nausea Only   Penicillins Rash, Other (See Comments)   Has patient had a PCN reaction causing immediate rash, facial/tongue/throat swelling, SOB or lightheadedness with hypotension: no Has patient had a PCN reaction causing severe rash involving mucus  membranes or skin necrosis: no Has patient had a PCN reaction that required hospitalization no Has patient had a PCN reaction occurring within the last 10 years: {yes If all of the above answers are "NO", then may proceed with Cephalosporin use.      Medication List    STOP taking these medications   ibuprofen 600 MG tablet Commonly known as:  ADVIL,MOTRIN   meloxicam 15 MG tablet Commonly known as:  MOBIC     TAKE these medications   acetaminophen 500 MG tablet Commonly known as:  TYLENOL Take 2 tablets (1,000 mg total) by mouth every 8 (eight) hours for 14 days. For Pain. What changed:    when to take this  reasons to take this  additional instructions   albuterol 108 (90 Base) MCG/ACT inhaler Commonly known as:  PROVENTIL HFA;VENTOLIN  HFA Inhale 2 puffs into the lungs every 4 (four) hours as needed for wheezing or shortness of breath.   albuterol (2.5 MG/3ML) 0.083% nebulizer solution Commonly known as:  PROVENTIL Take 3 mLs (2.5 mg total) by nebulization every 6 (six) hours as needed for wheezing or shortness of breath.   aspirin EC 81 MG tablet Take 1 tablet (81 mg total) by mouth 2 (two) times daily. For DVT prophylaxis for 30 days after surgery. What changed:    when to take this  additional instructions   celecoxib 200 MG capsule Commonly known as:  CELEBREX Take 1 capsule (200 mg total) by mouth 2 (two) times daily for 14 days. For 2 weeks post op for pain and inflammation.  Discontinue Ibuprofen, Meloxicam, or other Anti-inflammatory medicine when taking this medicine.   docusate sodium 100 MG capsule Commonly known as:  COLACE Take 1 capsule (100 mg total) by mouth 2 (two) times daily. To prevent constipation while taking pain medication.   famotidine 20 MG tablet Commonly known as:  PEPCID Take 1 tablet (20 mg total) by mouth 2 (two) times daily. For Gastroprotection   furosemide 40 MG tablet Commonly known as:  LASIX Take 1 tablet (40 mg total) by mouth daily. What changed:    when to take this  reasons to take this   gabapentin 300 MG capsule Commonly known as:  NEURONTIN Take 1 capsule (300 mg total) by mouth 3 (three) times daily for 14 days. For 2 weeks post op for pain.   methocarbamol 500 MG tablet Commonly known as:  ROBAXIN Take 1 tablet (500 mg total) by mouth every 8 (eight) hours as needed for muscle spasms.   mometasone 220 MCG/INH inhaler Commonly known as:  ASMANEX Inhale 2 puffs into the lungs daily as needed (for shortness of breath or wheezing).   mometasone-formoterol 100-5 MCG/ACT Aero Commonly known as:  DULERA Inhale 2 puffs into the lungs daily as needed for wheezing or shortness of breath.   ondansetron 4 MG tablet Commonly known as:  ZOFRAN Take 1 tablet (4 mg  total) by mouth every 8 (eight) hours as needed for nausea or vomiting.   oxyCODONE 5 MG immediate release tablet Commonly known as:  Oxy IR/ROXICODONE Take 1 tablet (5 mg total) by mouth every 4 (four) hours as needed for breakthrough pain.   OXYGEN Inhale 2 L into the lungs continuous.   sertraline 100 MG tablet Commonly known as:  ZOLOFT Take 2 tablets (200 mg total) by mouth daily.   tamsulosin 0.4 MG Caps capsule Commonly known as:  FLOMAX Take 1 capsule (0.4 mg total) by mouth daily.  venlafaxine XR 75 MG 24 hr capsule Commonly known as:  EFFEXOR-XR Take 1 capsule (75 mg total) by mouth daily with breakfast.       Diagnostic Studies: Dg C-arm 1-60 Min-no Report  Result Date: 12/02/2018 Fluoroscopy was utilized by the requesting physician.  No radiographic interpretation.   Ct Renal Stone Study  Result Date: 11/13/2018 CLINICAL DATA:  Continued left flank pain. EXAM: CT ABDOMEN AND PELVIS WITHOUT CONTRAST TECHNIQUE: Multidetector CT imaging of the abdomen and pelvis was performed following the standard protocol without IV contrast. COMPARISON:  None. FINDINGS: Lower chest: No acute abnormality. Hepatobiliary: The liver was not imaged in its entirety. Visualized portions of the liver and gallbladder are normal. Pancreas: Unremarkable. No pancreatic ductal dilatation or surrounding inflammatory changes. Spleen: The spleen was not imaged in its entirety. Visualized portions of the spleen are normal. Adrenals/Urinary Tract: A right adrenal adenoma is stable. The left adrenal gland is normal. No definitive right renal stones. No right-sided hydronephrosis or perinephric stranding. The right ureter is normal. There is a punctate stone in the upper pole of the left kidney on coronal image 77. The previously identified stone in the left kidney has migrated into the proximal ureter measuring 7 mm on coronal image 65. There is resulting new hydronephrosis and perinephric stranding on the  left. The left ureter is dilated to the level of the stone. Beyond the level of the stone, the left ureter is decompressed. The bladder is normal. Stomach/Bowel: The stomach is not seen in its entirety. Visualized portions of the stomach and small bowel are normal. Visualized colon and appendix are normal. Vascular/Lymphatic: No significant vascular findings are present. No enlarged abdominal or pelvic lymph nodes. Reproductive: Status post hysterectomy. No adnexal masses. Other: No abdominal wall hernia or abnormality. No abdominopelvic ascites. Musculoskeletal: Severe degenerative changes in the hips. No acute bony abnormalities. IMPRESSION: 1. There is a 7 mm stone in the proximal left ureter resulting in new hydronephrosis and perinephric stranding. A punctate stone is seen in the upper pole of the left kidney as well. 2. Right adrenal adenoma, unchanged. 3. Severe degenerative changes in the hips, left greater than right. Electronically Signed   By: Gerome Sam III M.D   On: 11/13/2018 23:26   Dg Hip Operative Unilat W Or W/o Pelvis Left  Result Date: 12/02/2018 CLINICAL DATA:  Postop left hip replacement EXAM: OPERATIVE left HIP (WITH PELVIS IF PERFORMED) two VIEWS TECHNIQUE: Fluoroscopic spot image(s) were submitted for interpretation post-operatively. COMPARISON:  Left hip films of 11/09/2018 FINDINGS: The 2 C-arm spot films obtained show the acetabular and femoral components of the left hip replacement to be in good position. No complicating features are seen. IMPRESSION: Left hip replacement components in good position. No complicating features. Electronically Signed   By: Dwyane Dee M.D.   On: 12/02/2018 09:32   Dg Hip Unilat With Pelvis 2-3 Views Left  Result Date: 11/09/2018 CLINICAL DATA:  Worsening left hip pain. EXAM: DG HIP (WITH OR WITHOUT PELVIS) 2-3V LEFT COMPARISON:  None. FINDINGS: Severe degenerative changes in the left hip with complete joint space loss. Extensive subchondral  sclerosis and cyst formation. Moderate degenerative changes in the right hip. No acute bony abnormality. Specifically, no fracture, subluxation, or dislocation. IMPRESSION: Severe left and moderate right degenerative hip changes. No acute bony abnormality. Electronically Signed   By: Charlett Nose M.D.   On: 11/09/2018 20:18    Disposition: Discharge disposition: 01-Home or Self Care       Discharge  Instructions    Discharge patient   Complete by:  As directed    After A.M. Therapy.   Discharge disposition:  01-Home or Self Care   Discharge patient date:  12/03/2018      Follow-up Information    Sheral Apley, MD. Go on 12/15/2018.   Specialty:  Orthopedic Surgery Why:  Your appointment has been set for 1245 Contact information: [address] [place] [address] [phone]        [facility] Follow up.   Why:  Home Health Physical Therapy will come out to see you for five visit prior to your follow up with Dr. Adora Fridge information: [phone]           Signed: Albina Billet III PA-C 12/03/2018, 5:27 PM

## 2018-12-03 NOTE — Progress Notes (Signed)
[facility] were [facility]. Requested information be faxed to them, sent orders, demographics and clinicals.

## 2018-12-03 NOTE — Evaluation (Signed)
Occupational Therapy Evaluation Patient Details Name: Nicole Wyatt MRN: [id number] DOB: May 06, 1966 Today's Date: 12/03/2018    History of Present Illness LDATHA, h/o seizures   Clinical Impression   This 52 year old female was admitted for the above. At baseline, she is independent with adls. She was helping to care for her parents (one with dementia and bedbound; one with bone CA). She currently needs min A for adls with AE.  Issued kit to her. Family will assist as needed    Follow Up Recommendations  No OT follow up;Supervision/Assistance - 24 hour    Equipment Recommendations  3 in 1 bedside commode    Recommendations for Other Services       Precautions / Restrictions Precautions Precautions: Fall Restrictions Weight Bearing Restrictions: No      Mobility Bed Mobility         Supine to sit: Min guard Sit to supine: Min guard   General bed mobility comments: used gait belt.  Pt did use rail for Back to bed.  Cued to angle herself at home as she won't have this  Transfers   Equipment used: Rolling walker (2 wheeled)   Sit to Stand: Min guard         General transfer comment: for safety    Balance                                           ADL either performed or assessed with clinical judgement   ADL Overall ADL's : Needs assistance/impaired Eating/Feeding: Independent   Grooming: Supervision/safety;Sitting   Upper Body Bathing: Set up   Lower Body Bathing: Min guard;Sit to/from stand;With adaptive equipment   Upper Body Dressing : Set up   Lower Body Dressing: Minimal assistance;With adaptive equipment;Sit to/from stand   Toilet Transfer: Min guard;Ambulation;RW(bed  )   Toileting- Water quality scientist and Hygiene: Min guard;Sit to/from stand   Tub/ Shower Transfer: Walk-in shower;Min guard;Ambulation;3 in 1     General ADL Comments: educated on and used AE.  issued kit as buying it would be a hardship.        Vision         Perception     Praxis      Pertinent Vitals/Pain Pain Assessment: Faces Faces Pain Scale: Hurts a little bit Pain Location: left hip Pain Descriptors / Indicators: Sore Pain Intervention(s): Limited activity within patient's tolerance;Monitored during session;Premedicated before session;Repositioned     Hand Dominance     Extremity/Trunk Assessment Upper Extremity Assessment Upper Extremity Assessment: Overall WFL for tasks assessed           Communication Communication Communication: No difficulties   Cognition Arousal/Alertness: Awake/alert Behavior During Therapy: WFL for tasks assessed/performed Overall Cognitive Status: Within Functional Limits for tasks assessed                                     General Comments       Exercises     Shoulder Instructions      Home Living Family/patient expects to be discharged to:: Private residence Living Arrangements: Other relatives Available Help at Discharge: Family               Bathroom Shower/Tub: Walk-in Psychologist, prison and probation services: Standard     Home Equipment: None  Prior Functioning/Environment Level of Independence: Independent                 OT Problem List:        OT Treatment/Interventions:      OT Goals(Current goals can be found in the care plan section) Acute Rehab OT Goals Patient Stated Goal: go on a trip OT Goal Formulation: With patient Time For Goal Achievement: 12/17/18 Potential to Achieve Goals: Good  OT Frequency:     Barriers to D/C:            Co-evaluation              AM-PAC OT "6 Clicks" Daily Activity     Outcome Measure Help from another person eating meals?: None Help from another person taking care of personal grooming?: A Little Help from another person toileting, which includes using toliet, bedpan, or urinal?: A Little Help from another person bathing (including washing, rinsing, drying)?: A  Little Help from another person to put on and taking off regular upper body clothing?: A Little Help from another person to put on and taking off regular lower body clothing?: A Little 6 Click Score: 19   End of Session    Activity Tolerance: Patient tolerated treatment well Patient left: in bed;with call bell/phone within reach;with family/visitor present  OT Visit Diagnosis: Pain Pain - Right/Left: Left Pain - part of body: Hip                Time: 1610-9604 OT Time Calculation (min): 18 min Charges:  OT General Charges $OT Visit: 1 Visit OT Evaluation $OT Eval Low Complexity: 1 Low  Lesle Chris, OTR/L Acute Rehabilitation Services [phone] [facility] pager [phone] office 12/03/2018  Nicole Wyatt 12/03/2018, 9:42 AM

## 2018-12-04 DIAGNOSIS — F338 Other recurrent depressive disorders: Secondary | ICD-10-CM | POA: Diagnosis not present

## 2018-12-04 DIAGNOSIS — Z96642 Presence of left artificial hip joint: Secondary | ICD-10-CM | POA: Diagnosis not present

## 2018-12-04 DIAGNOSIS — G4733 Obstructive sleep apnea (adult) (pediatric): Secondary | ICD-10-CM | POA: Diagnosis not present

## 2018-12-04 DIAGNOSIS — F319 Bipolar disorder, unspecified: Secondary | ICD-10-CM | POA: Diagnosis not present

## 2018-12-04 DIAGNOSIS — Z4732 Aftercare following explantation of hip joint prosthesis: Secondary | ICD-10-CM | POA: Diagnosis not present

## 2018-12-04 DIAGNOSIS — J449 Chronic obstructive pulmonary disease, unspecified: Secondary | ICD-10-CM | POA: Diagnosis not present

## 2018-12-05 ENCOUNTER — Other Ambulatory Visit: Payer: Self-pay

## 2018-12-05 DIAGNOSIS — F331 Major depressive disorder, recurrent, moderate: Secondary | ICD-10-CM

## 2018-12-05 MED ORDER — SERTRALINE HCL 100 MG PO TABS: 200.0000 mg | ORAL_TABLET | Freq: Every day | ORAL | 0 refills | Status: DC

## 2018-12-08 ENCOUNTER — Other Ambulatory Visit: Payer: Self-pay

## 2018-12-08 DIAGNOSIS — G4733 Obstructive sleep apnea (adult) (pediatric): Secondary | ICD-10-CM | POA: Diagnosis not present

## 2018-12-08 DIAGNOSIS — F331 Major depressive disorder, recurrent, moderate: Secondary | ICD-10-CM

## 2018-12-08 DIAGNOSIS — J449 Chronic obstructive pulmonary disease, unspecified: Secondary | ICD-10-CM | POA: Diagnosis not present

## 2018-12-08 DIAGNOSIS — Z4732 Aftercare following explantation of hip joint prosthesis: Secondary | ICD-10-CM | POA: Diagnosis not present

## 2018-12-08 DIAGNOSIS — F338 Other recurrent depressive disorders: Secondary | ICD-10-CM | POA: Diagnosis not present

## 2018-12-08 DIAGNOSIS — Z96642 Presence of left artificial hip joint: Secondary | ICD-10-CM | POA: Diagnosis not present

## 2018-12-08 DIAGNOSIS — F319 Bipolar disorder, unspecified: Secondary | ICD-10-CM | POA: Diagnosis not present

## 2018-12-08 MED ORDER — SERTRALINE HCL 100 MG PO TABS: 200.0000 mg | ORAL_TABLET | Freq: Every day | ORAL | 0 refills | Status: DC

## 2018-12-11 DIAGNOSIS — J449 Chronic obstructive pulmonary disease, unspecified: Secondary | ICD-10-CM | POA: Diagnosis not present

## 2018-12-11 DIAGNOSIS — Z4732 Aftercare following explantation of hip joint prosthesis: Secondary | ICD-10-CM | POA: Diagnosis not present

## 2018-12-11 DIAGNOSIS — G4733 Obstructive sleep apnea (adult) (pediatric): Secondary | ICD-10-CM | POA: Diagnosis not present

## 2018-12-11 DIAGNOSIS — F319 Bipolar disorder, unspecified: Secondary | ICD-10-CM | POA: Diagnosis not present

## 2018-12-11 DIAGNOSIS — Z96642 Presence of left artificial hip joint: Secondary | ICD-10-CM | POA: Diagnosis not present

## 2018-12-11 DIAGNOSIS — F338 Other recurrent depressive disorders: Secondary | ICD-10-CM | POA: Diagnosis not present

## 2018-12-15 DIAGNOSIS — F319 Bipolar disorder, unspecified: Secondary | ICD-10-CM | POA: Diagnosis not present

## 2018-12-15 DIAGNOSIS — J449 Chronic obstructive pulmonary disease, unspecified: Secondary | ICD-10-CM | POA: Diagnosis not present

## 2018-12-15 DIAGNOSIS — Z4732 Aftercare following explantation of hip joint prosthesis: Secondary | ICD-10-CM | POA: Diagnosis not present

## 2018-12-15 DIAGNOSIS — M1612 Unilateral primary osteoarthritis, left hip: Secondary | ICD-10-CM | POA: Diagnosis not present

## 2018-12-15 DIAGNOSIS — G4733 Obstructive sleep apnea (adult) (pediatric): Secondary | ICD-10-CM | POA: Diagnosis not present

## 2018-12-15 DIAGNOSIS — F338 Other recurrent depressive disorders: Secondary | ICD-10-CM | POA: Diagnosis not present

## 2018-12-15 DIAGNOSIS — Z96642 Presence of left artificial hip joint: Secondary | ICD-10-CM | POA: Diagnosis not present

## 2018-12-19 DIAGNOSIS — F338 Other recurrent depressive disorders: Secondary | ICD-10-CM | POA: Diagnosis not present

## 2018-12-19 DIAGNOSIS — G4733 Obstructive sleep apnea (adult) (pediatric): Secondary | ICD-10-CM | POA: Diagnosis not present

## 2018-12-19 DIAGNOSIS — Z4732 Aftercare following explantation of hip joint prosthesis: Secondary | ICD-10-CM | POA: Diagnosis not present

## 2018-12-19 DIAGNOSIS — F319 Bipolar disorder, unspecified: Secondary | ICD-10-CM | POA: Diagnosis not present

## 2018-12-19 DIAGNOSIS — J449 Chronic obstructive pulmonary disease, unspecified: Secondary | ICD-10-CM | POA: Diagnosis not present

## 2018-12-19 DIAGNOSIS — Z96642 Presence of left artificial hip joint: Secondary | ICD-10-CM | POA: Diagnosis not present

## 2018-12-22 DIAGNOSIS — M1612 Unilateral primary osteoarthritis, left hip: Secondary | ICD-10-CM | POA: Diagnosis not present

## 2018-12-23 DIAGNOSIS — Z96642 Presence of left artificial hip joint: Secondary | ICD-10-CM | POA: Diagnosis not present

## 2018-12-23 DIAGNOSIS — Z4732 Aftercare following explantation of hip joint prosthesis: Secondary | ICD-10-CM | POA: Diagnosis not present

## 2018-12-23 DIAGNOSIS — G4733 Obstructive sleep apnea (adult) (pediatric): Secondary | ICD-10-CM | POA: Diagnosis not present

## 2018-12-23 DIAGNOSIS — F319 Bipolar disorder, unspecified: Secondary | ICD-10-CM | POA: Diagnosis not present

## 2018-12-23 DIAGNOSIS — F338 Other recurrent depressive disorders: Secondary | ICD-10-CM | POA: Diagnosis not present

## 2018-12-23 DIAGNOSIS — J449 Chronic obstructive pulmonary disease, unspecified: Secondary | ICD-10-CM | POA: Diagnosis not present

## 2018-12-25 DIAGNOSIS — G4733 Obstructive sleep apnea (adult) (pediatric): Secondary | ICD-10-CM | POA: Diagnosis not present

## 2018-12-25 DIAGNOSIS — Z96642 Presence of left artificial hip joint: Secondary | ICD-10-CM | POA: Diagnosis not present

## 2018-12-25 DIAGNOSIS — J449 Chronic obstructive pulmonary disease, unspecified: Secondary | ICD-10-CM | POA: Diagnosis not present

## 2018-12-25 DIAGNOSIS — F338 Other recurrent depressive disorders: Secondary | ICD-10-CM | POA: Diagnosis not present

## 2018-12-25 DIAGNOSIS — F319 Bipolar disorder, unspecified: Secondary | ICD-10-CM | POA: Diagnosis not present

## 2018-12-25 DIAGNOSIS — Z4732 Aftercare following explantation of hip joint prosthesis: Secondary | ICD-10-CM | POA: Diagnosis not present

## 2018-12-30 DIAGNOSIS — F338 Other recurrent depressive disorders: Secondary | ICD-10-CM | POA: Diagnosis not present

## 2018-12-30 DIAGNOSIS — J449 Chronic obstructive pulmonary disease, unspecified: Secondary | ICD-10-CM | POA: Diagnosis not present

## 2018-12-30 DIAGNOSIS — Z4732 Aftercare following explantation of hip joint prosthesis: Secondary | ICD-10-CM | POA: Diagnosis not present

## 2018-12-30 DIAGNOSIS — G4733 Obstructive sleep apnea (adult) (pediatric): Secondary | ICD-10-CM | POA: Diagnosis not present

## 2018-12-30 DIAGNOSIS — F319 Bipolar disorder, unspecified: Secondary | ICD-10-CM | POA: Diagnosis not present

## 2018-12-30 DIAGNOSIS — Z96642 Presence of left artificial hip joint: Secondary | ICD-10-CM | POA: Diagnosis not present

## 2019-01-01 DIAGNOSIS — Z96642 Presence of left artificial hip joint: Secondary | ICD-10-CM | POA: Diagnosis not present

## 2019-01-01 DIAGNOSIS — M1612 Unilateral primary osteoarthritis, left hip: Secondary | ICD-10-CM | POA: Diagnosis not present

## 2019-01-01 DIAGNOSIS — J449 Chronic obstructive pulmonary disease, unspecified: Secondary | ICD-10-CM | POA: Diagnosis not present

## 2019-01-01 DIAGNOSIS — G4733 Obstructive sleep apnea (adult) (pediatric): Secondary | ICD-10-CM | POA: Diagnosis not present

## 2019-01-01 DIAGNOSIS — F319 Bipolar disorder, unspecified: Secondary | ICD-10-CM | POA: Diagnosis not present

## 2019-01-01 DIAGNOSIS — F338 Other recurrent depressive disorders: Secondary | ICD-10-CM | POA: Diagnosis not present

## 2019-01-01 DIAGNOSIS — Z4732 Aftercare following explantation of hip joint prosthesis: Secondary | ICD-10-CM | POA: Diagnosis not present

## 2019-01-05 ENCOUNTER — Ambulatory Visit: Payer: Self-pay | Admitting: Nurse Practitioner

## 2019-01-06 ENCOUNTER — Ambulatory Visit: Payer: Medicare Other | Admitting: Nurse Practitioner

## 2019-01-06 ENCOUNTER — Encounter: Payer: Self-pay | Admitting: Nurse Practitioner

## 2019-01-06 VITALS — BP 125/72 | HR 77 | Resp 16 | Ht 66.0 in | Wt 249.0 lb

## 2019-01-06 DIAGNOSIS — H60503 Unspecified acute noninfective otitis externa, bilateral: Secondary | ICD-10-CM | POA: Diagnosis not present

## 2019-01-06 DIAGNOSIS — Z1239 Encounter for other screening for malignant neoplasm of breast: Secondary | ICD-10-CM | POA: Diagnosis not present

## 2019-01-06 DIAGNOSIS — R601 Generalized edema: Secondary | ICD-10-CM | POA: Diagnosis not present

## 2019-01-06 DIAGNOSIS — J4520 Mild intermittent asthma, uncomplicated: Secondary | ICD-10-CM | POA: Insufficient documentation

## 2019-01-06 DIAGNOSIS — F331 Major depressive disorder, recurrent, moderate: Secondary | ICD-10-CM

## 2019-01-06 DIAGNOSIS — Z23 Encounter for immunization: Secondary | ICD-10-CM | POA: Diagnosis not present

## 2019-01-06 MED ORDER — VENLAFAXINE HCL ER 75 MG PO CP24: 75.0000 mg | ORAL_CAPSULE | Freq: Every day | ORAL | 5 refills | Status: DC

## 2019-01-06 MED ORDER — SERTRALINE HCL 100 MG PO TABS: 200.0000 mg | ORAL_TABLET | Freq: Every day | ORAL | 5 refills | Status: DC

## 2019-01-06 MED ORDER — NEOMYCIN-POLYMYXIN-HC 3.5-10000-1 OT SUSP: 4.0000 [drp] | Freq: Two times a day (BID) | OTIC | 0 refills | Status: DC

## 2019-01-06 MED ORDER — ALBUTEROL SULFATE HFA 108 (90 BASE) MCG/ACT IN AERS: 2.0000 | INHALATION_SPRAY | RESPIRATORY_TRACT | 5 refills | Status: DC | PRN

## 2019-01-06 MED ORDER — FUROSEMIDE 40 MG PO TABS: 40.0000 mg | ORAL_TABLET | Freq: Every day | ORAL | 5 refills | Status: AC | PRN

## 2019-01-06 NOTE — Progress Notes (Signed)
[facility] [address] [place], [place] [address]  Internal MEDICINE  Office Visit Note  Patient Name: Nicole ScalesSelena L Wyatt  [id number]  [id number]  Date of Service: 01/11/2019  Chief Complaint  Patient presents with  . Anxiety  . COPD    The patient is here for routine follow up visit. She had total left hip replacement 12/02/2018. Dong well. Walking with a cane to help with mobility. Has follow=up with orthopedic surgeon on Friday. Blood pressure well controlled. Depression and anxiety continue to be well managed on sertraline 200mg  daily and venlafaxine ER 75mg  daily. States she is no longer taking alprazolam.  Today, she has tight feeling in her left ear. Feels congested. Having a hard time hearing in left ear.       Current Medication: Outpatient Encounter Medications as of 01/06/2019  Medication Sig Note  . albuterol (PROVENTIL HFA;VENTOLIN HFA) 108 (90 Base) MCG/ACT inhaler Inhale 2 puffs into the lungs every 4 (four) hours as needed for wheezing or shortness of breath.   Marland Kitchen. albuterol (PROVENTIL) (2.5 MG/3ML) 0.083% nebulizer solution Take 3 mLs (2.5 mg total) by nebulization every 6 (six) hours as needed for wheezing or shortness of breath.   Marland Kitchen. aspirin EC 81 MG tablet Take 1 tablet (81 mg total) by mouth 2 (two) times daily. For DVT prophylaxis for 30 days after surgery.   . docusate sodium (COLACE) 100 MG capsule Take 1 capsule (100 mg total) by mouth 2 (two) times daily. To prevent constipation while taking pain medication.   . furosemide (LASIX) 40 MG tablet Take 1 tablet (40 mg total) by mouth daily as needed for fluid.   . methocarbamol (ROBAXIN) 500 MG tablet Take 1 tablet (500 mg total) by mouth every 8 (eight) hours as needed for muscle spasms.   . mometasone (ASMANEX) 220 MCG/INH inhaler Inhale 2 puffs into the lungs daily as needed (for shortness of breath or wheezing).    . mometasone-formoterol (DULERA) 100-5 MCG/ACT AERO Inhale 2 puffs into the lungs daily  as needed for wheezing or shortness of breath.   . ondansetron (ZOFRAN) 4 MG tablet Take 1 tablet (4 mg total) by mouth every 8 (eight) hours as needed for nausea or vomiting.   . OXYGEN Inhale 2 L into the lungs continuous.  12/02/2018: Pt trying to wean off of oxygen, used last about 1 week ago from today, 12/02/18  . sertraline (ZOLOFT) 100 MG tablet Take 2 tablets (200 mg total) by mouth daily.   . tamsulosin (FLOMAX) 0.4 MG CAPS capsule Take 1 capsule (0.4 mg total) by mouth daily.   Marland Kitchen. venlafaxine XR (EFFEXOR-XR) 75 MG 24 hr capsule Take 1 capsule (75 mg total) by mouth daily with breakfast.   . [DISCONTINUED] albuterol (PROVENTIL HFA;VENTOLIN HFA) 108 (90 Base) MCG/ACT inhaler Inhale 2 puffs into the lungs every 4 (four) hours as needed for wheezing or shortness of breath.    . [DISCONTINUED] furosemide (LASIX) 40 MG tablet Take 1 tablet (40 mg total) by mouth daily. (Patient taking differently: Take 40 mg by mouth daily as needed for fluid. )   . [DISCONTINUED] sertraline (ZOLOFT) 100 MG tablet Take 2 tablets (200 mg total) by mouth daily.   . [DISCONTINUED] venlafaxine XR (EFFEXOR-XR) 75 MG 24 hr capsule Take 1 capsule (75 mg total) by mouth daily with breakfast.   . famotidine (PEPCID) 20 MG tablet Take 1 tablet (20 mg total) by mouth 2 (two) times daily. For Gastroprotection   . gabapentin (NEURONTIN) 300  MG capsule Take 1 capsule (300 mg total) by mouth 3 (three) times daily for 14 days. For 2 weeks post op for pain.   Marland Kitchen neomycin-polymyxin-hydrocortisone (CORTISPORIN) 3.5-10000-1 OTIC suspension Place 4 drops into both ears 2 (two) times daily.    No facility-administered encounter medications on file as of 01/06/2019.     Surgical History: Past Surgical History:  Procedure Laterality Date  . ABDOMINAL HYSTERECTOMY  1999  . BLADDER SURGERY    . CARDIAC CATHETERIZATION Bilateral 05/29/2016   Procedure: Right/Left Heart Cath and Coronary Angiography;  Surgeon: Lamar Blinks, MD;   Location: [facility] INVASIVE CV LAB;  Service: Cardiovascular;  Laterality: Bilateral;  . COLONOSCOPY    . COLONOSCOPY N/A 07/19/2015   Procedure: COLONOSCOPY;  Surgeon: Earline Mayotte, MD;  Location: [facility] ENDOSCOPY;  Service: Endoscopy;  Laterality: N/A;  . FLEXIBLE BRONCHOSCOPY N/A 07/27/2016   Procedure: FLEXIBLE BRONCHOSCOPY;  Surgeon: Yevonne Pax, MD;  Location: [facility] ORS;  Service: Pulmonary;  Laterality: N/A;  . FOOT SURGERY    . KIDNEY STONE SURGERY  7 years  . TOTAL HIP ARTHROPLASTY Left 12/02/2018   Procedure: TOTAL HIP ARTHROPLASTY ANTERIOR APPROACH;  Surgeon: Sheral Apley, MD;  Location: [facility] ORS;  Service: Orthopedics;  Laterality: Left;  . TUBAL LIGATION      Medical History: Past Medical History:  Diagnosis Date  . Abnormal Pap smear of cervix   . Anxiety   . Arthritis   . Bipolar 1 disorder (HCC)   . COPD (chronic obstructive pulmonary disease) (HCC)   . Depression   . Dyspnea    with exertion   . Kidney stone   . Oxygen dependent    patient uses 2L oz PRN ; reports hardly ever uses it unless very SOB    . Rocky Mountain spotted fever 2018  . Sarcoidosis of lung (HCC)    managed by Dr Lindie Spruce   . Seizures (HCC) last seizure 05/2013  . Sleep apnea    uses CPAP nightly   . UTI (urinary tract infection) 11/13/2018   see ED visit in epic ; was dc'd with oral abx and reports completed and relief of sx     Family History: Family History  Problem Relation Age of Onset  . Hypertension Mother   . Arthritis Mother   . Hypertension Father   . Arthritis Father   . Prostate cancer Father   . Ovarian cancer Maternal Grandmother     Social History   Socioeconomic History  . Marital status: Single    Spouse name: Not on file  . Number of children: Not on file  . Years of education: Not on file  . Highest education level: Not on file  Occupational History  . Not on file  Social Needs  . Financial resource strain: Not on file  . Food insecurity:    Worry: Not  on file    Inability: Not on file  . Transportation needs:    Medical: Not on file    Non-medical: Not on file  Tobacco Use  . Smoking status: Current Every Day Smoker    Packs/day: 0.50    Years: 14.00    Pack years: 7.00    Types: Cigarettes    Last attempt to quit: 05/07/2016    Years since quitting: 2.6  . Smokeless tobacco: Never Used  . Tobacco comment: 11-25-18 reports  down to just 1 cigarette per day   Substance and Sexual Activity  . Alcohol use: Yes  Alcohol/week: 0.0 standard drinks    Comment: seldom   . Drug use: No  . Sexual activity: Never  Lifestyle  . Physical activity:    Days per week: Not on file    Minutes per session: Not on file  . Stress: Not on file  Relationships  . Social connections:    Talks on phone: Not on file    Gets together: Not on file    Attends religious service: Not on file    Active member of club or organization: Not on file    Attends meetings of clubs or organizations: Not on file    Relationship status: Not on file  . Intimate partner violence:    Fear of current or ex partner: Not on file    Emotionally abused: Not on file    Physically abused: Not on file    Forced sexual activity: Not on file  Other Topics Concern  . Not on file  Social History Narrative  . Not on file      Review of Systems  Constitutional: Positive for activity change. Negative for chills, fatigue and unexpected weight change.       Using a cane to help with mobility since having total hip replacement.   HENT: Positive for ear pain. Negative for congestion, rhinorrhea, sneezing and sore throat.   Respiratory: Negative for cough, chest tightness, shortness of breath and wheezing.   Cardiovascular: Negative for chest pain and palpitations.  Gastrointestinal: Negative for abdominal pain, constipation, diarrhea, nausea and vomiting.  Endocrine: Negative for cold intolerance, heat intolerance, polydipsia and polyuria.  Musculoskeletal: Positive for  arthralgias. Negative for back pain, joint swelling and neck pain.       Left hip stiffness and tenderness after having left hip replacement 12/02/2018  Skin: Negative for rash.  Allergic/Immunologic: Negative for environmental allergies.  Neurological: Negative for dizziness, tremors, numbness and headaches.  Hematological: Negative for adenopathy. Does not bruise/bleed easily.  Psychiatric/Behavioral: Positive for dysphoric mood. Negative for behavioral problems (Depression), sleep disturbance and suicidal ideas. The patient is nervous/anxious.     Today's Vitals   01/06/19 1348  BP: 125/72  Pulse: 77  Resp: 16  SpO2: 98%  Weight: 249 lb (112.9 kg)  Height: 5\' 6"  (1.676 m)    Physical Exam Vitals signs and nursing note reviewed.  Constitutional:      General: She is not in acute distress.    Appearance: Normal appearance. She is well-developed. She is not diaphoretic.  HENT:     Head: Normocephalic and atraumatic.     Right Ear: Tympanic membrane is erythematous and bulging.     Left Ear: Tympanic membrane is bulging.     Mouth/Throat:     Pharynx: No oropharyngeal exudate.  Eyes:     Pupils: Pupils are equal, round, and reactive to light.  Neck:     Musculoskeletal: Normal range of motion and neck supple.     Thyroid: No thyromegaly.     Vascular: No JVD.     Trachea: No tracheal deviation.  Cardiovascular:     Rate and Rhythm: Normal rate and regular rhythm.     Heart sounds: Normal heart sounds. No murmur. No friction rub. No gallop.   Pulmonary:     Effort: Pulmonary effort is normal. No respiratory distress.     Breath sounds: Normal breath sounds. No wheezing or rales.  Chest:     Chest wall: No tenderness.  Abdominal:     General: Bowel sounds are  normal.     Palpations: Abdomen is soft.  Musculoskeletal: Normal range of motion.     Comments: Left hip tenderness. Walking with cane to help with mobility.  Lymphadenopathy:     Cervical: No cervical  adenopathy.  Skin:    General: Skin is warm and dry.  Neurological:     General: No focal deficit present.     Mental Status: She is alert and oriented to person, place, and time.     Cranial Nerves: No cranial nerve deficit.  Psychiatric:        Behavior: Behavior normal.        Thought Content: Thought content normal.        Judgment: Judgment normal.    Assessment/Plan: 1. Acute otitis externa of both ears, unspecified type Start cortisporin ear drops. Insert 4 drops in both ears twice daily for next 7 to 10 years. - neomycin-polymyxin-hydrocortisone (CORTISPORIN) 3.5-10000-1 OTIC suspension; Place 4 drops into both ears 2 (two) times daily.  Dispense: 10 mL; Refill: 0  2. Mild intermittent asthma without complication Stable. Continue to use rescue inhaler as needed and as prescribed  - albuterol (PROVENTIL HFA;VENTOLIN HFA) 108 (90 Base) MCG/ACT inhaler; Inhale 2 puffs into the lungs every 4 (four) hours as needed for wheezing or shortness of breath.  Dispense: 1 Inhaler; Refill: 5  3. Generalized edema Take furosemide 40mg  daily as needed for swelling.  - furosemide (LASIX) 40 MG tablet; Take 1 tablet (40 mg total) by mouth daily as needed for fluid.  Dispense: 30 tablet; Refill: 5  4. Depression, major, recurrent, moderate (HCC) Well managed. Continue zoloft and Effexor as prescribed. Refills provided today.  - sertraline (ZOLOFT) 100 MG tablet; Take 2 tablets (200 mg total) by mouth daily.  Dispense: 60 tablet; Refill: 5 - venlafaxine XR (EFFEXOR-XR) 75 MG 24 hr capsule; Take 1 capsule (75 mg total) by mouth daily with breakfast.  Dispense: 30 capsule; Refill: 5  5. Screening for breast cancer Mammogram to be scheduled.   General Counseling: Tashonda verbalizes understanding of the findings of todays visit and agrees with plan of treatment. I have discussed any further diagnostic evaluation that may be needed or ordered today. We also reviewed her medications today. she has  been encouraged to call the office with any questions or concerns that should arise related to todays visit.   Hypertension Counseling:   The following hypertensive lifestyle modification were recommended and discussed:  1. Limiting alcohol intake to less than 1 oz/day of ethanol:(24 oz of beer or 8 oz of wine or 2 oz of 100-proof whiskey). 2. Take baby ASA 81 mg daily. 3. Importance of regular aerobic exercise and losing weight. 4. Reduce dietary saturated fat and cholesterol intake for overall cardiovascular health. 5. Maintaining adequate dietary potassium, calcium, and magnesium intake. 6. Regular monitoring of the blood pressure. 7. Reduce sodium intake to less than 100 mmol/day (less than 2.3 gm of sodium or less than 6 gm of sodium choride)   This patient was seen by Vincent Gros FNP Collaboration with Dr Lyndon Code as a part of collaborative care agreement  Meds ordered this encounter  Medications  . albuterol (PROVENTIL HFA;VENTOLIN HFA) 108 (90 Base) MCG/ACT inhaler    Sig: Inhale 2 puffs into the lungs every 4 (four) hours as needed for wheezing or shortness of breath.    Dispense:  1 Inhaler    Refill:  5    Order Specific Question:   Supervising Provider  Answer:   Lyndon Code [[id number]]  . furosemide (LASIX) 40 MG tablet    Sig: Take 1 tablet (40 mg total) by mouth daily as needed for fluid.    Dispense:  30 tablet    Refill:  5    Order Specific Question:   Supervising Provider    Answer:   Lyndon Code [[id number]]  . sertraline (ZOLOFT) 100 MG tablet    Sig: Take 2 tablets (200 mg total) by mouth daily.    Dispense:  60 tablet    Refill:  5    Order Specific Question:   Supervising Provider    Answer:   Lyndon Code [[id number]]  . venlafaxine XR (EFFEXOR-XR) 75 MG 24 hr capsule    Sig: Take 1 capsule (75 mg total) by mouth daily with breakfast.    Dispense:  30 capsule    Refill:  5    Order Specific Question:   Supervising Provider    Answer:   Lyndon Code  [[id number]]  . neomycin-polymyxin-hydrocortisone (CORTISPORIN) 3.5-10000-1 OTIC suspension    Sig: Place 4 drops into both ears 2 (two) times daily.    Dispense:  10 mL    Refill:  0    Order Specific Question:   Supervising Provider    Answer:   Lyndon Code [[id number]]    Time spent: 36 Minutes      Dr Lyndon Code Internal medicine

## 2019-01-13 ENCOUNTER — Ambulatory Visit: Payer: Medicare Other | Admitting: Physical Therapy

## 2019-01-15 ENCOUNTER — Ambulatory Visit: Payer: Medicare Other | Admitting: Physical Therapy

## 2019-01-22 ENCOUNTER — Ambulatory Visit: Payer: Medicare Other | Attending: Orthopedic Surgery | Admitting: Physical Therapy

## 2019-01-22 ENCOUNTER — Encounter: Payer: Self-pay | Admitting: Physical Therapy

## 2019-01-22 DIAGNOSIS — Z96642 Presence of left artificial hip joint: Secondary | ICD-10-CM | POA: Insufficient documentation

## 2019-01-22 DIAGNOSIS — R269 Unspecified abnormalities of gait and mobility: Secondary | ICD-10-CM | POA: Insufficient documentation

## 2019-01-22 DIAGNOSIS — R29898 Other symptoms and signs involving the musculoskeletal system: Secondary | ICD-10-CM | POA: Insufficient documentation

## 2019-01-22 DIAGNOSIS — M25552 Pain in left hip: Secondary | ICD-10-CM | POA: Insufficient documentation

## 2019-01-22 NOTE — Patient Instructions (Signed)
Access Code: [email or web address]  URL: [email or web address]  Date: 01/22/2019  Prepared by: Dorene Grebe   Exercises  Supine Quad Set - 15 reps - 2 sets - 10 hold - 1x daily - 7x weekly  Beginner Bridge - 15 reps - 2 sets - 5 hold - 1x daily - 7x weekly  Supine Pelvic Tilt with Straight Leg Raise - 15 reps - 2 sets - 1x daily - 7x weekly  Standing Hip Abduction - 15 reps - 2 sets - 1x daily - 7x weekly  Standing Hip Extension - 15 reps - 2 sets - 1x daily - 7x weekly  Standing Marching - 15 reps - 2 sets - 1x daily - 7x weekly  Forward Step Up - 15 reps - 2 sets - 1x daily - 7x weekly  Mini Squat with Counter Support - 15 reps - 2 sets - 1x daily - 7x weekly  Standing Hamstring Stretch on Chair - 3 reps - 2 sets - 30 hold - 1x daily - 7x weekly

## 2019-01-22 NOTE — Therapy (Deleted)
[facility] [address] [place], [place], [address] Phone: [phone]   Fax:  [phone]  Physical Therapy Evaluation  Patient Details  Name: Nicole ScalesSelena L Wyatt MRN: [id number] Date of Birth: 1966/11/23 Referring Provider (PT): Dr. Eulah PontMurphy   Encounter Date: 01/22/2019  PT End of Session - 01/22/19 1756    Visit Number  1    Number of Visits  9    Date for PT Re-Evaluation  02/19/19    PT Start Time  1347    PT Stop Time  1503    PT Time Calculation (min)  76 min    Equipment Utilized During Treatment  Gait belt    Activity Tolerance  Patient tolerated treatment well;Patient limited by pain    Behavior During Therapy  [facility] for tasks assessed/performed       Past Medical History:  Diagnosis Date  . Abnormal Pap smear of cervix   . Anxiety   . Arthritis   . Bipolar 1 disorder (HCC)   . COPD (chronic obstructive pulmonary disease) (HCC)   . Depression   . Dyspnea    with exertion   . Kidney stone   . Oxygen dependent    patient uses 2L oz PRN ; reports hardly ever uses it unless very SOB    . Rocky Mountain spotted fever 2018  . Sarcoidosis of lung (HCC)    managed by Dr Lindie SpruceSaddat   . Seizures (HCC) last seizure 05/2013  . Sleep apnea    uses CPAP nightly   . UTI (urinary tract infection) 11/13/2018   see ED visit in epic ; was dc'd with oral abx and reports completed and relief of sx     Past Surgical History:  Procedure Laterality Date  . ABDOMINAL HYSTERECTOMY  1999  . BLADDER SURGERY    . CARDIAC CATHETERIZATION Bilateral 05/29/2016   Procedure: Right/Left Heart Cath and Coronary Angiography;  Surgeon: Lamar BlinksBruce J Kowalski, MD;  Location: [facility] INVASIVE CV LAB;  Service: Cardiovascular;  Laterality: Bilateral;  . COLONOSCOPY    . COLONOSCOPY N/A 07/19/2015   Procedure: COLONOSCOPY;  Surgeon: Earline MayotteJeffrey W Byrnett, MD;  [facility] ENDOSCOPY;  Service: Endoscopy;  Laterality: N/A;  . FLEXIBLE BRONCHOSCOPY N/A 07/27/2016   Procedure: FLEXIBLE BRONCHOSCOPY;  Surgeon: Yevonne PaxSaadat A Khan, MD;  Location: [facility] ORS;  Service: Pulmonary;  Laterality: N/A;  . FOOT SURGERY    . KIDNEY STONE SURGERY  7 years  . TOTAL HIP ARTHROPLASTY Left 12/02/2018   Procedure: TOTAL HIP ARTHROPLASTY ANTERIOR APPROACH;  Surgeon: Sheral ApleyMurphy, Timothy D, MD;  Location: [facility] ORS;  Service: Orthopedics;  Laterality: Left;  . TUBAL LIGATION      There were no vitals filed for this visit.   Subjective Assessment - 01/22/19 1748    Subjective  Pt. presents to clinic with SPC with slight L antalgic gait. Pt. reports s/p left THA on 12/02/2018 in which she stayed one night in hospital. Pt. reports current pain 2/10 NPS and soreness around incision and in L glute. Pt. reports some numbess around incision and muscle spasms in L HS and gastroc. Pt. states she goes back to [facility] on Monday for f/u.     Pertinent History  Pt. is a pleasant and motivated 53 y/o female s/p THA on 12/02/2018. Pt. reports she stayed one night in hospital after surgery. She states that approximently 2 weeks after surgery, she went back to [facility] for f/u and was put on antibiotics for an infection which was  resolved. Pt. completed home health therapy for 3 weeks following surgery and has continued doing HEP. Pt. is currently not working because she was put on disability before her THA.       How long can you sit comfortably?  1 hour    How long can you stand comfortably?  30 min    Patient Stated Goals  decrease pain, return to softball and gardening     Currently in Pain?  Yes    Pain Score  2     Pain Location  Hip    Pain Orientation  Left    Pain Descriptors / Indicators  Aching;Numbness    Pain Type  Surgical pain    Pain Onset  More than a month ago    Pain Frequency  Intermittent    Aggravating Factors   walking, sitting > 1 hour, standing > 30 min    Pain Relieving Factors  ice           AROM Hip AROM WNL except for L hip IR: 12 deg.  MMT Hip flexion: R 4/5, L 3+/5 (with  pain Hip abd: R 5/5, L 5/5 Hip add: R 5/5, L 5/5 Hip IR: R 5/5, L 4/5 Hip ER: R 5/5, L 5/5 Knee flex: R 5/5, L 5/5 Knee ext: R 5/5, L 5/5 DF: R 5/5, L 5/5  Palpation Tender to palpation over L glute. Numbness noted around incision  Balance: > 30 sec. SLB on firm surface  Gait: SPC slight L antalgic gait   Treatment: Therex:  Ambulating in clinic with and without SPC ~120 feet Balance assessment: tandem and SLB Reviewed HEP: quad sets, SLR, glute bridge, standing hip abduction/ extension/ flexion, partial squats, step ups, standing HS stretch  See HEP (handouts provided)    Objective measurements completed on examination: See above findings.        PT Education - 01/22/19 1756    Education Details  HEP - see handouts    Person(s) Educated  Patient    Methods  Explanation;Demonstration    Comprehension  Verbalized understanding;Returned demonstration;Verbal cues required         Plan - 01/22/19 1800    Clinical Impression Statement  Pt. is a pleasant 53 y/o female, s/p L THA on 12/02/2018. Pt. had infection 2 weeks after surgery but was treated with antibiotics from [facility] and pts. scar is healing well now. Pt. reports current pain 2/10 NPS, best 1/10 and worst 5/10. Pt. reports soreness in L glute and around incision site and has been treating with ice. Pt. has been doing previous HEP given by HHPT. Pt. ambulates with SPC and slight L antalgic gait with L lateral lean. Pt. demonstrates the need for B UE assistance when standing from chair. Pt. was able to achieve B SLB >30 seconds without increase sx. Pt. is unable to complete 1 SLR on L LE without assitance needed from PT and is unable to keep knee extension while lowering L LE. Pt. Reports having stairs to enter her house with railing on either side. Pt. Is able to ascend stairs using reciprocal pattern but step to pattern while descending due to "fear of falling." After cueing and CGA assist from PT, pt was able to  descend stairs using reciprocal pattern. Pt. was instructed on new HEP to continue and the importance of icing to relieve muscle soreness. Pt. will continue to benefit from skilled PT services in order to improve L LE strength and normalize gait pattern without  SPC to have pain-free ADLs and functional mobility.     Clinical Presentation  Stable    Clinical Decision Making  Low    Rehab Potential  Good    PT Frequency  2x / week    PT Duration  4 weeks    PT Treatment/Interventions  ADLs/Self Care Home Management;Cryotherapy;Gait training;Stair training;Therapeutic activities;Therapeutic exercise;Balance training;Neuromuscular re-education;Patient/family education;Passive range of motion;Scar mobilization;Manual techniques    PT Next Visit Plan  review HEP, continue strengthening and gait training, f/u on [facility] appt.    PT Home Exercise Plan  see handouts    Consulted and Agree with Plan of Care  Patient       Patient will benefit from skilled therapeutic intervention in order to improve the following deficits and impairments:  Abnormal gait, Pain, Decreased activity tolerance, Decreased mobility, Decreased strength, Decreased range of motion, Difficulty walking, Decreased endurance, Decreased scar mobility, Obesity, Impaired flexibility, Postural dysfunction  Visit Diagnosis: Status post total hip replacement, left  Gait difficulty  Weakness of left hip  Pain in joint of left hip     Problem List Patient Active Problem List   Diagnosis Date Noted  . Acute otitis externa of both ears 01/06/2019  . Mild intermittent asthma without complication 01/06/2019  . Primary osteoarthritis of hip 12/02/2018  . 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 OSA (obstructive sleep apnea) 10/13/2018  . 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 Rocky Mountain spotted fever 07/02/2018  . Urinary tract infection without hematuria 07/02/2018  . Dysuria 06/23/2018  . Depression, major, recurrent, moderate  (HCC) 03/26/2018  . Generalized edema 03/26/2018  . Anaphylactic syndrome 03/26/2018  . Cutaneous candidiasis 03/26/2018  . Bursitis of hip 02/03/2018  . Osteoarthritis of knee 02/03/2018  . Chronic pulmonary hypertension (HCC) 06/18/2016  . S/P cardiac cath 06/18/2016  . Unstable angina (HCC) 05/24/2016  . Bilateral leg edema 05/22/2016  . Shortness of breath 04/30/2016  . Skin lesion of right arm 03/01/2016  . Screening for breast cancer 06/10/2015  . Skin lesion of breast 06/10/2015  . Hx of seizure disorder 06/24/2014  . Left-sided weakness 06/24/2014  . Tobacco abuse 06/24/2014   Levander Campion, SPT 01/23/2019, 5:14 PM  [place] [facility] [address] [place], [place], [address] Phone: [phone]   Fax:  [phone]  Name: NABA BUONOCORE MRN: [id number] Date of Birth: 1966-06-13

## 2019-01-23 NOTE — Therapy (Signed)
[facility] [address] [place], [place], [address] Phone: [phone]   Fax:  [phone]  Physical Therapy Evaluation  Patient Details  Name: Nicole Wyatt MRN: [id number] Date of Birth: 30-Jul-1966 Referring Provider (PT): Dr. Eulah Pont   Encounter Date: 01/22/2019  PT End of Session - 01/22/19 1756    Visit Number  1    Number of Visits  9    Date for PT Re-Evaluation  02/19/19    PT Start Time  1347    PT Stop Time  1503    PT Time Calculation (min)  76 min    Equipment Utilized During Treatment  Gait belt    Activity Tolerance  Patient tolerated treatment well;Patient limited by pain    Behavior During Therapy  [facility] for tasks assessed/performed       Past Medical History:  Diagnosis Date  . Abnormal Pap smear of cervix   . Anxiety   . Arthritis   . Bipolar 1 disorder (HCC)   . COPD (chronic obstructive pulmonary disease) (HCC)   . Depression   . Dyspnea    with exertion   . Kidney stone   . Oxygen dependent    patient uses 2L oz PRN ; reports hardly ever uses it unless very SOB    . Rocky Mountain spotted fever 2018  . Sarcoidosis of lung (HCC)    managed by Dr Lindie Spruce   . Seizures (HCC) last seizure 05/2013  . Sleep apnea    uses CPAP nightly   . UTI (urinary tract infection) 11/13/2018   see ED visit in epic ; was dc'd with oral abx and reports completed and relief of sx     Past Surgical History:  Procedure Laterality Date  . ABDOMINAL HYSTERECTOMY  1999  . BLADDER SURGERY    . CARDIAC CATHETERIZATION Bilateral 05/29/2016   Procedure: Right/Left Heart Cath and Coronary Angiography;  Surgeon: Lamar Blinks, MD;  Location: [facility] INVASIVE CV LAB;  Service: Cardiovascular;  Laterality: Bilateral;  . COLONOSCOPY    . COLONOSCOPY N/A 07/19/2015   Procedure: COLONOSCOPY;  Surgeon: Earline Mayotte, MD;  Location: [facility] ENDOSCOPY;  Service: Endoscopy;  Laterality: N/A;  . FLEXIBLE BRONCHOSCOPY N/A 07/27/2016    Procedure: FLEXIBLE BRONCHOSCOPY;  Surgeon: Yevonne Pax, MD;  Location: [facility] ORS;  Service: Pulmonary;  Laterality: N/A;  . FOOT SURGERY    . KIDNEY STONE SURGERY  7 years  . TOTAL HIP ARTHROPLASTY Left 12/02/2018   Procedure: TOTAL HIP ARTHROPLASTY ANTERIOR APPROACH;  Surgeon: Sheral Apley, MD;  Location: [facility] ORS;  Service: Orthopedics;  Laterality: Left;  . TUBAL LIGATION      There were no vitals filed for this visit.   Subjective Assessment - 01/22/19 1748    Subjective  Pt. presents to clinic with SPC with slight L antalgic gait. Pt. reports s/p left THA on 12/02/2018 in which she stayed one night in hospital. Pt. reports current pain 2/10 NPS and soreness around incision and in L glute. Pt. reports some numbess around incision and muscle spasms in L HS and gastroc. Pt. states she goes back to [facility] on Monday for f/u.     Pertinent History  Pt. is a pleasant and motivated 53 y/o female s/p THA on 12/02/2018. Pt. reports she stayed one night in hospital after surgery. She states that approximently 2 weeks after surgery, she went back to [facility] for f/u and was put on antibiotics for an infection which  was resolved. Pt. completed home health therapy for 3 weeks following surgery and has continued doing HEP. Pt. is currently not working because she was put on disability before her THA.       How long can you sit comfortably?  1 hour    How long can you stand comfortably?  30 min    Patient Stated Goals  decrease pain, return to softball and gardening     Currently in Pain?  Yes    Pain Score  2     Pain Location  Hip    Pain Orientation  Left    Pain Descriptors / Indicators  Aching;Numbness    Pain Type  Surgical pain    Pain Onset  More than a month ago    Pain Frequency  Intermittent    Aggravating Factors   walking, sitting > 1 hour, standing > 30 min    Pain Relieving Factors  ice            AROM Hip AROM WNL except for L hip IR: 12 deg.  MMT Hip flexion: R 4/5, L 3+/5  (with pain Hip abd: R 5/5, L 5/5 Hip add: R 5/5, L 5/5 Hip IR: R 5/5, L 4/5 Hip ER: R 5/5, L 5/5 Knee flex: R 5/5, L 5/5 Knee ext: R 5/5, L 5/5 DF: R 5/5, L 5/5  Palpation Tender to palpation over L glute. Numbness noted around incision  Balance: > 30 sec. SLB on firm surface  Gait: SPC slight L antalgic gait   Treatment: Therex:  Ambulating in clinic with and without SPC ~120 feet Balance assessment: tandem and SLB Reviewed HEP: quad sets, SLR, glute bridge, standing hip abduction/ extension/ flexion, partial squats, step ups, standing HS stretch  See HEP (handouts provided)    Objective measurements completed on examination: See above findings.        PT Education - 01/22/19 1756    Education Details  HEP - see handouts    Person(s) Educated  Patient    Methods  Explanation;Demonstration    Comprehension  Verbalized understanding;Returned demonstration;Verbal cues required          PT Long Term Goals - 01/23/19 1713      PT LONG TERM GOAL #1   Title  Pt. will improve FOTO score >59 to promote pain-free ADLs and functional mobility.    Baseline  Eval FOTO: 56    Time  4    Period  Weeks    Status  New    Target Date  02/19/19      PT LONG TERM GOAL #2   Title  Pt. will be able to walk with normal gait pattern without SPC in order to promote functional and efficient ADLs.    Baseline  Pt. ambulates with L antalgic gait on SPC on eval.    Time  4    Period  Weeks    Status  New    Target Date  02/19/19      PT LONG TERM GOAL #3   Title  Pt. will improve L LE MMT by 1/2 grade to return to gardening and softball without pain.    Baseline  Pt. L MMT: grossly 5/5 except hip flexion 3+/5, hip IR 4/5    Time  4    Period  Weeks    Status  New    Target Date  02/19/19      PT LONG TERM GOAL #4   Title  Pt. will be able to complete independent HEP without pain in order to return to pain-free ADLs and normal gait pattern without SPC.    Baseline  Pt.  was prescribed current HEP that will be progressed into independent HEP.    Time  4    Period  Weeks    Status  New    Target Date  02/19/19         Plan - 01/22/19 1800    Clinical Impression Statement  Pt. is a pleasant 53 y/o female, s/p L THA on 12/02/2018. Pt. had infection 2 weeks after surgery but was treated with antibiotics from [facility] and pts. scar is healing well now. Pt. reports current pain 2/10 NPS, best 1/10 and worst 5/10. Pt. reports soreness in L glute and around incision site and has been treating with ice. Pt. has been doing previous HEP given by HHPT. Pt. ambulates with SPC and slight L antalgic gait with L lateral lean. Pt. demonstrates the need for B UE assistance when standing from chair. Pt. was able to achieve B SLB >30 seconds without increase sx. Pt. is unable to complete 1 SLR on L LE without assitance needed from PT and is unable to keep knee extension while lowering L LE. Pt. was instructed on new HEP to continue and the importance of icing to relieve muscle soreness. Pt. will continue to benefit from skilled PT services in order to improve L LE strength and normalize gait pattern without SPC to have pain-free ADLs and functional mobility.     Clinical Presentation  Stable    Clinical Decision Making  Low    Rehab Potential  Good    PT Frequency  2x / week    PT Duration  4 weeks    PT Treatment/Interventions  ADLs/Self Care Home Management;Cryotherapy;Gait training;Stair training;Therapeutic activities;Therapeutic exercise;Balance training;Neuromuscular re-education;Patient/family education;Passive range of motion;Scar mobilization;Manual techniques;Electrical Stimulation;Moist Heat;Functional mobility training    PT Next Visit Plan  review HEP, continue strengthening and gait training, f/u on [facility] appt.    PT Home Exercise Plan  see handouts    Consulted and Agree with Plan of Care  Patient       Patient will benefit from skilled therapeutic intervention in order to  improve the following deficits and impairments:  Abnormal gait, Pain, Decreased activity tolerance, Decreased mobility, Decreased strength, Decreased range of motion, Difficulty walking, Decreased endurance, Decreased scar mobility, Obesity, Impaired flexibility, Postural dysfunction  Visit Diagnosis: Status post total hip replacement, left  Gait difficulty  Weakness of left hip  Pain in joint of left hip     Problem List Patient Active Problem List   Diagnosis Date Noted  . Acute otitis externa of both ears 01/06/2019  . Mild intermittent asthma without complication 01/06/2019  . Primary osteoarthritis of hip 12/02/2018  . 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 OSA (obstructive sleep apnea) 10/13/2018  . 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 Rocky Mountain spotted fever 07/02/2018  . Urinary tract infection without hematuria 07/02/2018  . Dysuria 06/23/2018  . Depression, major, recurrent, moderate (HCC) 03/26/2018  . Generalized edema 03/26/2018  . Anaphylactic syndrome 03/26/2018  . Cutaneous candidiasis 03/26/2018  . Bursitis of hip 02/03/2018  . Osteoarthritis of knee 02/03/2018  . Chronic pulmonary hypertension (HCC) 06/18/2016  . S/P cardiac cath 06/18/2016  . Unstable angina (HCC) 05/24/2016  . Bilateral leg edema 05/22/2016  . Shortness of breath 04/30/2016  . Skin lesion of right arm 03/01/2016  . Screening for breast  cancer 06/10/2015  . Skin lesion of breast 06/10/2015  . Hx of seizure disorder 06/24/2014  . Left-sided weakness 06/24/2014  . Tobacco abuse 06/24/2014   Cammie Mcgee, PT, DPT # [id number] 01/23/2019, 5:15 PM  [facility] [address] [place], [place], [address] Phone: [phone]   Fax:  [phone]  Name: Nicole Wyatt MRN: [id number] Date of Birth: [birth year]

## 2019-01-29 ENCOUNTER — Ambulatory Visit: Payer: Medicare Other | Admitting: Physical Therapy

## 2019-02-03 ENCOUNTER — Ambulatory Visit: Payer: Medicare Other | Attending: Orthopedic Surgery | Admitting: Physical Therapy

## 2019-02-03 ENCOUNTER — Encounter: Payer: Self-pay | Admitting: Physical Therapy

## 2019-02-03 DIAGNOSIS — R29898 Other symptoms and signs involving the musculoskeletal system: Secondary | ICD-10-CM | POA: Insufficient documentation

## 2019-02-03 DIAGNOSIS — R269 Unspecified abnormalities of gait and mobility: Secondary | ICD-10-CM | POA: Insufficient documentation

## 2019-02-03 DIAGNOSIS — Z96642 Presence of left artificial hip joint: Secondary | ICD-10-CM | POA: Insufficient documentation

## 2019-02-03 DIAGNOSIS — M25552 Pain in left hip: Secondary | ICD-10-CM | POA: Diagnosis not present

## 2019-02-03 NOTE — Therapy (Signed)
[facility] [address] [place], [place], [address] Phone: [phone]   Fax:  [phone]  Physical Therapy Treatment  Patient Details  Name: Nicole Wyatt MRN: [id number] Date of Birth: 03/03/1966 Referring Provider (PT): Dr. Eulah PontMurphy   Encounter Date: 02/03/2019  PT End of Session - 02/03/19 1402    Visit Number  2    Number of Visits  9    Date for PT Re-Evaluation  02/19/19    PT Start Time  1332    PT Stop Time  1438    PT Time Calculation (min)  66 min    Equipment Utilized During Treatment  Gait belt    Activity Tolerance  Patient tolerated treatment well    Behavior During Therapy  [facility] for tasks assessed/performed       Past Medical History:  Diagnosis Date  . Abnormal Pap smear of cervix   . Anxiety   . Arthritis   . Bipolar 1 disorder (HCC)   . COPD (chronic obstructive pulmonary disease) (HCC)   . Depression   . Dyspnea    with exertion   . Kidney stone   . Oxygen dependent    patient uses 2L oz PRN ; reports hardly ever uses it unless very SOB    . Rocky Mountain spotted fever 2018  . Sarcoidosis of lung (HCC)    managed by Dr Lindie SpruceSaddat   . Seizures (HCC) last seizure 05/2013  . Sleep apnea    uses CPAP nightly   . UTI (urinary tract infection) 11/13/2018   see ED visit in epic ; was dc'd with oral abx and reports completed and relief of sx     Past Surgical History:  Procedure Laterality Date  . ABDOMINAL HYSTERECTOMY  1999  . BLADDER SURGERY    . CARDIAC CATHETERIZATION Bilateral 05/29/2016   Procedure: Right/Left Heart Cath and Coronary Angiography;  Surgeon: Lamar BlinksBruce J Kowalski, MD;  Location: [facility] INVASIVE CV LAB;  Service: Cardiovascular;  Laterality: Bilateral;  . COLONOSCOPY    . COLONOSCOPY N/A 07/19/2015   Procedure: COLONOSCOPY;  Surgeon: Earline MayotteJeffrey W Byrnett, MD;  [facility];  Service: Endoscopy;  Laterality: N/A;  . FLEXIBLE BRONCHOSCOPY N/A 07/27/2016   Procedure: FLEXIBLE  BRONCHOSCOPY;  Surgeon: Yevonne PaxSaadat A Khan, MD;  Location: [facility] ORS;  Service: Pulmonary;  Laterality: N/A;  . FOOT SURGERY    . KIDNEY STONE SURGERY  7 years  . TOTAL HIP ARTHROPLASTY Left 12/02/2018   Procedure: TOTAL HIP ARTHROPLASTY ANTERIOR APPROACH;  Surgeon: Sheral ApleyMurphy, Timothy D, MD;  Location: [facility] ORS;  Service: Orthopedics;  Laterality: Left;  . TUBAL LIGATION      There were no vitals filed for this visit.  Subjective Assessment - 02/03/19 1400    Subjective  Pt. reports going to [facility] last week and no changes to medications/ POC. Pt. returns to [facility] next Thursday for imaging of L hip. Pt. reports doing HEP and has some muscle soreness.     Pertinent History  Pt. is a pleasant and motivated 53 y/o female s/p THA on 12/02/2018. Pt. reports she stayed one night in hospital after surgery. She states that approximently 2 weeks after surgery, she went back to [facility] for f/u and was put on antibiotics for an infection which was resolved. Pt. completed home health therapy for 3 weeks following surgery and has continued doing HEP. Pt. is currently not working because she was put on disability before her THA.  How long can you sit comfortably?  1 hour    How long can you stand comfortably?  30 min    Patient Stated Goals  decrease pain, return to softball and gardening     Currently in Pain?  No/denies    Pain Location  Hip    Pain Orientation  Left    Pain Descriptors / Indicators  Aching    Pain Onset  More than a month ago         Treatment:  Therex:   NuStep 10 min L 6 (warm up no charge) 3 way hip at //-bars with 3# weight (2 finger assist) Sit to stand with mirror feedback at blue mat table 5x (B knee valgus and uneven WB noted) TG B squats and calf raises 3x15 each Monster walks //-bars YTB  Bilat resisted side stepping with YTB Stair assessment  Step ups 6 inches 2x15 (1 set fwd step ups, 1 set lateral step ups) Gait reassessment in hallway 2 laps (min. L antalgic gait and L  pronation during swing phase)  Seated cryotherapy to L hip after session 10 min (no charge)     PT Long Term Goals - 01/23/19 1713      PT LONG TERM GOAL #1   Title  Pt. will improve FOTO score >59 to promote pain-free ADLs and functional mobility.    Baseline  Eval FOTO: 56    Time  4    Period  Weeks    Status  New    Target Date  02/19/19      PT LONG TERM GOAL #2   Title  Pt. will be able to walk with normal gait pattern without SPC in order to promote functional and efficient ADLs.    Baseline  Pt. ambulates with L antalgic gait on SPC on eval.    Time  4    Period  Weeks    Status  New    Target Date  02/19/19      PT LONG TERM GOAL #3   Title  Pt. will improve L LE MMT by 1/2 grade to return to gardening and softball without pain.    Baseline  Pt. L MMT: grossly 5/5 except hip flexion 3+/5, hip IR 4/5    Time  4    Period  Weeks    Status  New    Target Date  02/19/19      PT LONG TERM GOAL #4   Title  Pt. will be able to complete independent HEP without pain in order to return to pain-free ADLs and normal gait pattern without SPC.    Baseline  Pt. was prescribed current HEP that will be progressed into independent HEP.    Time  4    Period  Weeks    Status  New    Target Date  02/19/19            Plan - 02/03/19 1429    Clinical Impression Statement  Pt. ambulates into clinic with moderate L antalgic gait with SPC but pt. states that she is only using cane when she feels unsteady. Pt. demonstrates unequal WB during partial squats but was able to correct with min verbal cueing from PT and mirror feedback. Pt. demonstrates B genu valgum and B LE ER during therex/ gait. When ambulating, pt. also falls into L pronation during swing phase. Pt. was able to tolerate all standing therex without incr. pain. Pt. will continue to benefit from skilled  PT services in order to incr. L LE strength/ ROM/ balance to normalize gait pattern and be able to complete pain-free ADLs  and functional mobility.    Clinical Presentation  Stable    Clinical Decision Making  Low    Rehab Potential  Good    PT Frequency  2x / week    PT Duration  4 weeks    PT Treatment/Interventions  ADLs/Self Care Home Management;Cryotherapy;Gait training;Stair training;Therapeutic activities;Therapeutic exercise;Balance training;Neuromuscular re-education;Patient/family education;Passive range of motion;Scar mobilization;Manual techniques;Electrical Stimulation;Moist Heat;Functional mobility training    PT Next Visit Plan  add TB to HEP, gait training, ankle stability exercises and hip strength, reassess SLR    PT Home Exercise Plan  see handouts    Consulted and Agree with Plan of Care  Patient       Patient will benefit from skilled therapeutic intervention in order to improve the following deficits and impairments:  Abnormal gait, Pain, Decreased activity tolerance, Decreased mobility, Decreased strength, Decreased range of motion, Difficulty walking, Decreased endurance, Decreased scar mobility, Obesity, Impaired flexibility, Postural dysfunction  Visit Diagnosis: Status post total hip replacement, left  Gait difficulty  Weakness of left hip  Pain in joint of left hip     Problem List Patient Active Problem List   Diagnosis Date Noted  . Acute otitis externa of both ears 01/06/2019  . Mild intermittent asthma without complication 01/06/2019  . Primary osteoarthritis of hip 12/02/2018  . 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 OSA (obstructive sleep apnea) 10/13/2018  . 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 Rocky Mountain spotted fever 07/02/2018  . Urinary tract infection without hematuria 07/02/2018  . Dysuria 06/23/2018  . Depression, major, recurrent, moderate (HCC) 03/26/2018  . Generalized edema 03/26/2018  . Anaphylactic syndrome 03/26/2018  . Cutaneous candidiasis 03/26/2018  . Bursitis of hip 02/03/2018  . Osteoarthritis of knee 02/03/2018  . Chronic pulmonary  hypertension (HCC) 06/18/2016  . S/P cardiac cath 06/18/2016  . Unstable angina (HCC) 05/24/2016  . Bilateral leg edema 05/22/2016  . Shortness of breath 04/30/2016  . Skin lesion of right arm 03/01/2016  . Screening for breast cancer 06/10/2015  . Skin lesion of breast 06/10/2015  . Hx of seizure disorder 06/24/2014  . Left-sided weakness 06/24/2014  . Tobacco abuse 06/24/2014   Cammie McgeeMichael C Sherk, PT, DPT # [id number] [address] [place], [place] 02/03/2019, 2:40 PM  [place] [facility] [address] [place], [place], [address] Phone: [phone]   Fax:  [phone]  Name: Nicole Wyatt MRN: [id number] Date of Birth: 1966/11/01

## 2019-02-05 ENCOUNTER — Ambulatory Visit: Payer: Medicare Other | Admitting: Physical Therapy

## 2019-02-05 ENCOUNTER — Encounter: Payer: Self-pay | Admitting: Physical Therapy

## 2019-02-05 DIAGNOSIS — R269 Unspecified abnormalities of gait and mobility: Secondary | ICD-10-CM

## 2019-02-05 DIAGNOSIS — M25552 Pain in left hip: Secondary | ICD-10-CM

## 2019-02-05 DIAGNOSIS — Z96642 Presence of left artificial hip joint: Secondary | ICD-10-CM | POA: Diagnosis not present

## 2019-02-05 DIAGNOSIS — R29898 Other symptoms and signs involving the musculoskeletal system: Secondary | ICD-10-CM | POA: Diagnosis not present

## 2019-02-05 NOTE — Patient Instructions (Signed)
Access Code: [email or web address]  URL: [email or web address]  Date: 02/05/2019  Prepared by: Dorene Grebe   Exercises  Supine Hip Adduction Isometric with Newman Pies - 10 reps - 2 sets - 5 hold - 1x daily - 7x weekly  Single Leg Stance with Support - 3 reps - 2 sets - 30 hold - 1x daily - 7x weekly  Standing March with Unilateral Counter Support - 10 reps - 2 sets - 1x daily - 7x weekly

## 2019-02-05 NOTE — Therapy (Deleted)
[facility] [address] [place], [place], [address] Phone: [phone]   Fax:  [phone]  Physical Therapy Treatment  Patient Details  Name: Nicole Wyatt MRN: [id number] Date of Birth: 1966-01-14 Referring Provider (PT): Dr. Eulah Pont   Encounter Date: 02/05/2019  PT End of Session - 02/05/19 1310    Visit Number  3    Number of Visits  9    Date for PT Re-Evaluation  02/19/19    PT Start Time  1301    Activity Tolerance  Patient tolerated treatment well    Behavior During Therapy  [facility] for tasks assessed/performed       Past Medical History:  Diagnosis Date  . Abnormal Pap smear of cervix   . Anxiety   . Arthritis   . Bipolar 1 disorder (HCC)   . COPD (chronic obstructive pulmonary disease) (HCC)   . Depression   . Dyspnea    with exertion   . Kidney stone   . Oxygen dependent    patient uses 2L oz PRN ; reports hardly ever uses it unless very SOB    . Rocky Mountain spotted fever 2018  . Sarcoidosis of lung (HCC)    managed by Dr Lindie Spruce   . Seizures (HCC) last seizure 05/2013  . Sleep apnea    uses CPAP nightly   . UTI (urinary tract infection) 11/13/2018   see ED visit in epic ; was dc'd with oral abx and reports completed and relief of sx     Past Surgical History:  Procedure Laterality Date  . ABDOMINAL HYSTERECTOMY  1999  . BLADDER SURGERY    . CARDIAC CATHETERIZATION Bilateral 05/29/2016   Procedure: Right/Left Heart Cath and Coronary Angiography;  Surgeon: Lamar Blinks, MD;  Location: [facility] INVASIVE CV LAB;  Service: Cardiovascular;  Laterality: Bilateral;  . COLONOSCOPY    . COLONOSCOPY N/A 07/19/2015   Procedure: COLONOSCOPY;  Surgeon: Earline Mayotte, MD;  Location: [facility] ENDOSCOPY;  Service: Endoscopy;  Laterality: N/A;  . FLEXIBLE BRONCHOSCOPY N/A 07/27/2016   Procedure: FLEXIBLE BRONCHOSCOPY;  Surgeon: Yevonne Pax, MD;  Location: [facility] ORS;  Service: Pulmonary;  Laterality: N/A;  . FOOT SURGERY     . KIDNEY STONE SURGERY  7 years  . TOTAL HIP ARTHROPLASTY Left 12/02/2018   Procedure: TOTAL HIP ARTHROPLASTY ANTERIOR APPROACH;  Surgeon: Sheral Apley, MD;  Location: [facility] ORS;  Service: Orthopedics;  Laterality: Left;  . TUBAL LIGATION      There were no vitals filed for this visit.  Subjective Assessment - 02/05/19 1305    Subjective  Pt. reports some muscle soreness from last session but no hip pain. Pt. states hot showers help decr. her soreness after PT.    Pertinent History  Pt. is a pleasant and motivated 53 y/o female s/p L THA on 12/02/2018. Pt. reports she stayed one night in hospital after surgery. She states that approximently 2 weeks after surgery, she went back to [place] for f/u and was put on antibiotics for an infection which was resolved. Pt. completed home health therapy for 3 weeks following surgery and has continued doing HEP. Pt. is currently not working because she was put on disability before her THA.       Limitations  Sitting;Standing;Walking;House hold activities    How long can you sit comfortably?  1 hour    How long can you stand comfortably?  30 min    Patient Stated Goals  decrease pain,  return to softball and gardening     Currently in Pain?  No/denies    Pain Score  0-No pain    Pain Location  Hip    Pain Orientation  Left    Pain Descriptors / Indicators  Aching    Pain Type  Surgical pain    Pain Onset  More than a month ago       Treatment:  Therex:   Scifit 10 min L 6 (warm up no charge) Stair HS and hip flexor stretch B 2x30 each Sit to stand with 1 airex pad (no UE assist) 2x10 Seated L LAQ 3# weights  Walking high marches attempted but stopped due to B LE weakness/ instability Standing high marches 20x Side step RTB in //-bars 4 laps TG Reassessment of supine SLR Reviewed HEP and added RTB  Neuro:  Tandem Walking SLB  VCs for modified ROM to maintain upright posture   3 way hip at //-bars with 3# weight (2 finger assist) Sit  to stand with mirror feedback at blue mat table 5x (B knee valgus and uneven WB noted) TG B squats and calf raises 3x15 each Monster walks //-bars YTB  Bilat resisted side stepping with YTB Stair assessment  Step ups 6 inches 2x15 (1 set fwd step ups, 1 set lateral step ups) Gait reassessment in hallway 2 laps (min. L antalgic gait and L pronation during swing phase)  Seated cryotherapy to L hip after session 10 min (no charge)     PT Long Term Goals - 01/23/19 1713      PT LONG TERM GOAL #1   Title  Pt. will improve FOTO score >59 to promote pain-free ADLs and functional mobility.    Baseline  Eval FOTO: 56    Time  4    Period  Weeks    Status  New    Target Date  02/19/19      PT LONG TERM GOAL #2   Title  Pt. will be able to walk with normal gait pattern without SPC in order to promote functional and efficient ADLs.    Baseline  Pt. ambulates with L antalgic gait on SPC on eval.    Time  4    Period  Weeks    Status  New    Target Date  02/19/19      PT LONG TERM GOAL #3   Title  Pt. will improve L LE MMT by 1/2 grade to return to gardening and softball without pain.    Baseline  Pt. L MMT: grossly 5/5 except hip flexion 3+/5, hip IR 4/5    Time  4    Period  Weeks    Status  New    Target Date  02/19/19      PT LONG TERM GOAL #4   Title  Pt. will be able to complete independent HEP without pain in order to return to pain-free ADLs and normal gait pattern without SPC.    Baseline  Pt. was prescribed current HEP that will be progressed into independent HEP.    Time  4    Period  Weeks    Status  New    Target Date  02/19/19              Patient will benefit from skilled therapeutic intervention in order to improve the following deficits and impairments:     Visit Diagnosis: Status post total hip replacement, left  Gait difficulty  Weakness of left  hip  Pain in joint of left hip     Problem List Patient Active Problem List   Diagnosis Date  Noted  . Acute otitis externa of both ears 01/06/2019  . Mild intermittent asthma without complication 01/06/2019  . Primary osteoarthritis of hip 12/02/2018  . 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 OSA (obstructive sleep apnea) 10/13/2018  . 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 Rocky Mountain spotted fever 07/02/2018  . Urinary tract infection without hematuria 07/02/2018  . Dysuria 06/23/2018  . Depression, major, recurrent, moderate (HCC) 03/26/2018  . Generalized edema 03/26/2018  . Anaphylactic syndrome 03/26/2018  . Cutaneous candidiasis 03/26/2018  . Bursitis of hip 02/03/2018  . Osteoarthritis of knee 02/03/2018  . Chronic pulmonary hypertension (HCC) 06/18/2016  . S/P cardiac cath 06/18/2016  . Unstable angina (HCC) 05/24/2016  . Bilateral leg edema 05/22/2016  . Shortness of breath 04/30/2016  . Skin lesion of right arm 03/01/2016  . Screening for breast cancer 06/10/2015  . Skin lesion of breast 06/10/2015  . Hx of seizure disorder 06/24/2014  . Left-sided weakness 06/24/2014  . Tobacco abuse 06/24/2014    Cammie McgeeSherk, [facility] 02/05/2019, 1:10 PM  [facility] [address] [place], [place], [address] Phone: [phone]   Fax:  [phone]  Name: Kinnie ScalesSelena L Dudenhoeffer MRN: [id number] Date of Birth: Jan 02, 1966

## 2019-02-05 NOTE — Therapy (Signed)
[place] [facility] [address] [place], [place], [address] Phone: [phone]   Fax:  [phone]  Physical Therapy Treatment  Patient Details  Name: Nicole Wyatt MRN: [id number] Date of Birth: 04/20/1966 Referring Provider (PT): Dr. Eulah PontMurphy   Encounter Date: 02/05/2019  PT End of Session - 02/05/19 1310    Visit Number  3    Number of Visits  9    Date for PT Re-Evaluation  02/19/19    PT Start Time  1301    PT Stop Time  1357    PT Time Calculation (min)  56 min    Activity Tolerance  Patient tolerated treatment well    Behavior During Therapy  [facility] for tasks assessed/performed       Past Medical History:  Diagnosis Date  . Abnormal Pap smear of cervix   . Anxiety   . Arthritis   . Bipolar 1 disorder (HCC)   . COPD (chronic obstructive pulmonary disease) (HCC)   . Depression   . Dyspnea    with exertion   . Kidney stone   . Oxygen dependent    patient uses 2L oz PRN ; reports hardly ever uses it unless very SOB    . Rocky Mountain spotted fever 2018  . Sarcoidosis of lung (HCC)    managed by Dr Lindie SpruceSaddat   . Seizures (HCC) last seizure 05/2013  . Sleep apnea    uses CPAP nightly   . UTI (urinary tract infection) 11/13/2018   see ED visit in epic ; was dc'd with oral abx and reports completed and relief of sx     Past Surgical History:  Procedure Laterality Date  . ABDOMINAL HYSTERECTOMY  1999  . BLADDER SURGERY    . CARDIAC CATHETERIZATION Bilateral 05/29/2016   Procedure: Right/Left Heart Cath and Coronary Angiography;  Surgeon: Lamar BlinksBruce J Kowalski, MD;  Location: [facility] INVASIVE CV LAB;  Service: Cardiovascular;  Laterality: Bilateral;  . COLONOSCOPY    . COLONOSCOPY N/A 07/19/2015   Procedure: COLONOSCOPY;  Surgeon: Earline MayotteJeffrey W Byrnett, MD;  [facility];  Service: Endoscopy;  Laterality: N/A;  . FLEXIBLE BRONCHOSCOPY N/A 07/27/2016   Procedure: FLEXIBLE BRONCHOSCOPY;  Surgeon: Yevonne PaxSaadat A Khan, MD;  Location: [facility]  ORS;  Service: Pulmonary;  Laterality: N/A;  . FOOT SURGERY    . KIDNEY STONE SURGERY  7 years  . TOTAL HIP ARTHROPLASTY Left 12/02/2018   Procedure: TOTAL HIP ARTHROPLASTY ANTERIOR APPROACH;  Surgeon: Sheral ApleyMurphy, Timothy D, MD;  Location: [facility] ORS;  Service: Orthopedics;  Laterality: Left;  . TUBAL LIGATION      There were no vitals filed for this visit.  Subjective Assessment - 02/05/19 1305    Subjective  Pt. reports some muscle soreness from last session but no hip pain. Pt. states hot showers help decr. her soreness after PT.    Pertinent History  Pt. is a pleasant and motivated 53 y/o female s/p L THA on 12/02/2018. Pt. reports she stayed one night in hospital after surgery. She states that approximently 2 weeks after surgery, she went back to [place] for f/u and was put on antibiotics for an infection which was resolved. Pt. completed home health therapy for 3 weeks following surgery and has continued doing HEP. Pt. is currently not working because she was put on disability before her THA.       Limitations  Sitting;Standing;Walking;House hold activities    How long can you sit comfortably?  1 hour  How long can you stand comfortably?  30 min    Patient Stated Goals  decrease pain, return to softball and gardening     Currently in Pain?  No/denies    Pain Score  0-No pain    Pain Location  Hip    Pain Orientation  Left    Pain Descriptors / Indicators  Aching    Pain Type  Surgical pain    Pain Onset  More than a month ago       Treatment:  Therex:   Scifit 10 min L 6 (warm up no charge) Stair HS and hip flexor stretch B 2x30 each Sit to stand with 1 airex pad (no UE assist) 2x10 Seated L LAQ 3# weights 2x15 Walking high marches attempted but stopped due to B LE weakness/ instability Standing high marches 20x Side step RTB in //-bars 4 laps Reassessment of supine SLR (quad lag noted Supine adduction squeezes 15x  Reviewed HEP and added RTB to standing hip  therex  Neuro:  Tandem Walking 3 laps SLB B 2x30 sec    Seated cryotherapy to L hip after session 10 min (no charge)     PT Long Term Goals - 01/23/19 1713      PT LONG TERM GOAL #1   Title  Pt. will improve FOTO score >59 to promote pain-free ADLs and functional mobility.    Baseline  Eval FOTO: 56    Time  4    Period  Weeks    Status  New    Target Date  02/19/19      PT LONG TERM GOAL #2   Title  Pt. will be able to walk with normal gait pattern without SPC in order to promote functional and efficient ADLs.    Baseline  Pt. ambulates with L antalgic gait on SPC on eval.    Time  4    Period  Weeks    Status  New    Target Date  02/19/19      PT LONG TERM GOAL #3   Title  Pt. will improve L LE MMT by 1/2 grade to return to gardening and softball without pain.    Baseline  Pt. L MMT: grossly 5/5 except hip flexion 3+/5, hip IR 4/5    Time  4    Period  Weeks    Status  New    Target Date  02/19/19      PT LONG TERM GOAL #4   Title  Pt. will be able to complete independent HEP without pain in order to return to pain-free ADLs and normal gait pattern without SPC.    Baseline  Pt. was prescribed current HEP that will be progressed into independent HEP.    Time  4    Period  Weeks    Status  New    Target Date  02/19/19         Plan - 02/05/19 1456    Clinical Impression Statement  Pt. needs VCs for modified L hip ROM to maintain upright posture/ trunk compensation. during standing hip therex. Pt. needs min verbal and tactile cues for sit to stand with no UE assist to prevent L knee valgus. Pt. reported anterior "pull" of hip throughout therex and stated that she has not performed scar massage.  PT educated pt. on importance of scar massage and instructed her how to do it throughout the day.  Pt. demonstrates L antalgic gait and L ankle instability during ambulation  as it falls into pronation but could be correct with VCs for incr. heel strike. Pt. was able to SLB  for ~15 sec on both sides. Pt. was given new exercise to add to HEP to address quad strength and ankle instability. Pt. will continue to benefit from skilled PT services in order to incr. L hip ROM/ strength to be able to complete pain-free ADLs and functional mobility.    Clinical Presentation  Stable    Clinical Decision Making  Low    Rehab Potential  Good    PT Frequency  2x / week    PT Duration  4 weeks    PT Treatment/Interventions  ADLs/Self Care Home Management;Cryotherapy;Gait training;Stair training;Therapeutic activities;Therapeutic exercise;Balance training;Neuromuscular re-education;Patient/family education;Passive range of motion;Scar mobilization;Manual techniques;Electrical Stimulation;Moist Heat;Functional mobility training    PT Next Visit Plan  review HEP, ankle stability, SLR, scar massage    PT Home Exercise Plan  see handouts    Consulted and Agree with Plan of Care  Patient       Patient will benefit from skilled therapeutic intervention in order to improve the following deficits and impairments:  Abnormal gait, Pain, Decreased activity tolerance, Decreased mobility, Decreased strength, Decreased range of motion, Difficulty walking, Decreased endurance, Decreased scar mobility, Obesity, Impaired flexibility, Postural dysfunction  Visit Diagnosis: Status post total hip replacement, left  Gait difficulty  Weakness of left hip  Pain in joint of left hip     Problem List Patient Active Problem List   Diagnosis Date Noted  . Acute otitis externa of both ears 01/06/2019  . Mild intermittent asthma without complication 01/06/2019  . Primary osteoarthritis of hip 12/02/2018  . 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 OSA (obstructive sleep apnea) 10/13/2018  . 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 Rocky Mountain spotted fever 07/02/2018  . Urinary tract infection without hematuria 07/02/2018  . Dysuria 06/23/2018  . Depression, major, recurrent, moderate (HCC)  03/26/2018  . Generalized edema 03/26/2018  . Anaphylactic syndrome 03/26/2018  . Cutaneous candidiasis 03/26/2018  . Bursitis of hip 02/03/2018  . Osteoarthritis of knee 02/03/2018  . Chronic pulmonary hypertension (HCC) 06/18/2016  . S/P cardiac cath 06/18/2016  . Unstable angina (HCC) 05/24/2016  . Bilateral leg edema 05/22/2016  . Shortness of breath 04/30/2016  . Skin lesion of right arm 03/01/2016  . Screening for breast cancer 06/10/2015  . Skin lesion of breast 06/10/2015  . Hx of seizure disorder 06/24/2014  . Left-sided weakness 06/24/2014  . Tobacco abuse 06/24/2014   Cammie McgeeMichael C Sherk, PT, DPT # [id number] [place], [place] 02/05/2019, 6:09 PM  [facility] [address] [place], [place], [address] Phone: [phone]   Fax:  [phone]  Name: Nicole Wyatt MRN: [id number] Date of Birth: 1966/03/19

## 2019-02-10 ENCOUNTER — Ambulatory Visit: Payer: Medicare Other | Admitting: Physical Therapy

## 2019-02-12 ENCOUNTER — Ambulatory Visit: Payer: Medicare Other | Admitting: Physical Therapy

## 2019-02-16 ENCOUNTER — Emergency Department
Admission: EM | Admit: 2019-02-16 | Discharge: 2019-02-16 | Disposition: A | Discharge status: Home / Self Care | Payer: Medicare Other | Attending: Emergency Medicine | Admitting: Emergency Medicine

## 2019-02-16 ENCOUNTER — Encounter: Payer: Self-pay | Admitting: Emergency Medicine

## 2019-02-16 DIAGNOSIS — Z96642 Presence of left artificial hip joint: Secondary | ICD-10-CM | POA: Insufficient documentation

## 2019-02-16 DIAGNOSIS — F17210 Nicotine dependence, cigarettes, uncomplicated: Secondary | ICD-10-CM | POA: Insufficient documentation

## 2019-02-16 DIAGNOSIS — H9203 Otalgia, bilateral: Secondary | ICD-10-CM | POA: Diagnosis present

## 2019-02-16 DIAGNOSIS — H6983 Other specified disorders of Eustachian tube, bilateral: Secondary | ICD-10-CM | POA: Diagnosis not present

## 2019-02-16 DIAGNOSIS — J449 Chronic obstructive pulmonary disease, unspecified: Secondary | ICD-10-CM | POA: Diagnosis not present

## 2019-02-16 DIAGNOSIS — Z7982 Long term (current) use of aspirin: Secondary | ICD-10-CM | POA: Insufficient documentation

## 2019-02-16 DIAGNOSIS — H6993 Unspecified Eustachian tube disorder, bilateral: Secondary | ICD-10-CM | POA: Diagnosis not present

## 2019-02-16 DIAGNOSIS — Z79899 Other long term (current) drug therapy: Secondary | ICD-10-CM | POA: Insufficient documentation

## 2019-02-16 MED ORDER — PREDNISONE 10 MG PO TABS: 30.0000 mg | ORAL_TABLET | Freq: Every day | ORAL | 0 refills | Status: DC

## 2019-02-16 MED ORDER — AZITHROMYCIN 250 MG PO TABS: ORAL_TABLET | ORAL | 0 refills | Status: DC

## 2019-02-16 MED ORDER — FLUTICASONE PROPIONATE 50 MCG/ACT NA SUSP: 2.0000 | Freq: Every day | NASAL | 2 refills | Status: AC

## 2019-02-16 NOTE — ED Provider Notes (Signed)
[facility] Emergency Department Provider Note  ____________________________________________   First MD Initiated Contact with Patient 02/16/19 1436     (approximate)  I have reviewed the triage vital signs and the nursing notes.   HISTORY  Chief Complaint Otalgia    HPI Nicole Wyatt is a 53 y.o. female presents emergency department complaining of bilateral ear pain and pressure.  She denies fever chills.  No cough or congestion.  She states she had same symptoms in her doctor gave her eardrops which have not helped.  She denies any drainage from the ears.    Past Medical History:  Diagnosis Date  . Abnormal Pap smear of cervix   . Anxiety   . Arthritis   . Bipolar 1 disorder (HCC)   . COPD (chronic obstructive pulmonary disease) (HCC)   . Depression   . Dyspnea    with exertion   . Kidney stone   . Oxygen dependent    patient uses 2L oz PRN ; reports hardly ever uses it unless very SOB    . [place] spotted fever 2018  . Sarcoidosis of lung (HCC)    managed by Dr Lindie Spruce   . Seizures (HCC) last seizure 05/2013  . Sleep apnea    uses CPAP nightly   . UTI (urinary tract infection) 11/13/2018   see ED visit in epic ; was dc'd with oral abx and reports completed and relief of sx     Patient Active Problem List   Diagnosis Date Noted  . Acute otitis externa of both ears 01/06/2019  . Mild intermittent asthma without complication 01/06/2019  . Primary osteoarthritis of hip 12/02/2018  . 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 OSA (obstructive sleep apnea) 10/13/2018  . 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 [place] spotted fever 07/02/2018  . Urinary tract infection without hematuria 07/02/2018  . Dysuria 06/23/2018  . Depression, major, recurrent, moderate (HCC) 03/26/2018  . Generalized edema 03/26/2018  . Anaphylactic syndrome 03/26/2018  . Cutaneous candidiasis 03/26/2018  . Bursitis of hip 02/03/2018  . Osteoarthritis  of knee 02/03/2018  . Chronic pulmonary hypertension (HCC) 06/18/2016  . S/P cardiac cath 06/18/2016  . Unstable angina (HCC) 05/24/2016  . Bilateral leg edema 05/22/2016  . Shortness of breath 04/30/2016  . Skin lesion of right arm 03/01/2016  . Screening for breast cancer 06/10/2015  . Skin lesion of breast 06/10/2015  . Hx of seizure disorder 06/24/2014  . Left-sided weakness 06/24/2014  . Tobacco abuse 06/24/2014    Past Surgical History:  Procedure Laterality Date  . ABDOMINAL HYSTERECTOMY  1999  . BLADDER SURGERY    . CARDIAC CATHETERIZATION Bilateral 05/29/2016   Procedure: Right/Left Heart Cath and Coronary Angiography;  Surgeon: Lamar Blinks, MD;  Location: [facility] INVASIVE CV LAB;  Service: Cardiovascular;  Laterality: Bilateral;  . COLONOSCOPY    . COLONOSCOPY N/A 07/19/2015   Procedure: COLONOSCOPY;  Surgeon: Earline Mayotte, MD;  Location: [facility] ENDOSCOPY;  Service: Endoscopy;  Laterality: N/A;  . FLEXIBLE BRONCHOSCOPY N/A 07/27/2016   Procedure: FLEXIBLE BRONCHOSCOPY;  Surgeon: Yevonne Pax, MD;  Location: [facility] ORS;  Service: Pulmonary;  Laterality: N/A;  . FOOT SURGERY    . KIDNEY STONE SURGERY  7 years  . TOTAL HIP ARTHROPLASTY Left 12/02/2018   Procedure: TOTAL HIP ARTHROPLASTY ANTERIOR APPROACH;  Surgeon: Sheral Apley, MD;  Location: [facility] ORS;  Service: Orthopedics;  Laterality: Left;  . TUBAL LIGATION      Prior to Admission  medications   Medication Sig Start Date End Date Taking? Authorizing Provider  albuterol (PROVENTIL HFA;VENTOLIN HFA) 108 (90 Base) MCG/ACT inhaler Inhale 2 puffs into the lungs every 4 (four) hours as needed for wheezing or shortness of breath. 01/06/19   Carlean Jews, NP  albuterol (PROVENTIL) (2.5 MG/3ML) 0.083% nebulizer solution Take 3 mLs (2.5 mg total) by nebulization every 6 (six) hours as needed for wheezing or shortness of breath. 03/25/18   Carlean Jews, NP  aspirin EC 81 MG tablet Take 1 tablet (81 mg total) by mouth 2  (two) times daily. For DVT prophylaxis for 30 days after surgery. 12/02/18   Albina Billet III, PA-C  azithromycin (ZITHROMAX Z-PAK) 250 MG tablet 2 pills today then 1 pill a day for 4 days 02/16/19   Sherrie Mustache Roselyn Bering, PA-C  docusate sodium (COLACE) 100 MG capsule Take 1 capsule (100 mg total) by mouth 2 (two) times daily. To prevent constipation while taking pain medication. 12/02/18   Albina Billet III, PA-C  famotidine (PEPCID) 20 MG tablet Take 1 tablet (20 mg total) by mouth 2 (two) times daily. For Gastroprotection 12/02/18 01/01/19  Martensen, Lucretia Kern III, PA-C  fluticasone (FLONASE) 50 MCG/ACT nasal spray Place 2 sprays into both nostrils daily. 02/16/19   Charlena Haub, Roselyn Bering, PA-C  furosemide (LASIX) 40 MG tablet Take 1 tablet (40 mg total) by mouth daily as needed for fluid. 01/06/19   Carlean Jews, NP  gabapentin (NEURONTIN) 300 MG capsule Take 1 capsule (300 mg total) by mouth 3 (three) times daily for 14 days. For 2 weeks post op for pain. 12/02/18 12/16/18  Albina Billet III, PA-C  methocarbamol (ROBAXIN) 500 MG tablet Take 1 tablet (500 mg total) by mouth every 8 (eight) hours as needed for muscle spasms. 12/02/18   Martensen, Lucretia Kern III, PA-C  mometasone [facility]) 220 MCG/INH inhaler Inhale 2 puffs into the lungs daily as needed (for shortness of breath or wheezing).     [provider]  mometasone-formoterol (DULERA) 100-5 MCG/ACT AERO Inhale 2 puffs into the lungs daily as needed for wheezing or shortness of breath.    [provider]  neomycin-polymyxin-hydrocortisone (CORTISPORIN) 3.5-10000-1 OTIC suspension Place 4 drops into both ears 2 (two) times daily. 01/06/19   Carlean Jews, NP  ondansetron (ZOFRAN) 4 MG tablet Take 1 tablet (4 mg total) by mouth every 8 (eight) hours as needed for nausea or vomiting. 12/02/18   Albina Billet III, PA-C  OXYGEN Inhale 2 L into the lungs continuous.     [provider]    predniSONE (DELTASONE) 10 MG tablet Take 3 tablets (30 mg total) by mouth daily with breakfast. 02/16/19   Sherrie Mustache Roselyn Bering, PA-C  sertraline (ZOLOFT) 100 MG tablet Take 2 tablets (200 mg total) by mouth daily. 01/06/19   Carlean Jews, NP  tamsulosin (FLOMAX) 0.4 MG CAPS capsule Take 1 capsule (0.4 mg total) by mouth daily. 11/13/18   Minna Antis, MD  venlafaxine XR (EFFEXOR-XR) 75 MG 24 hr capsule Take 1 capsule (75 mg total) by mouth daily with breakfast. 01/06/19   Carlean Jews, NP    Allergies Bee venom; Ciprofloxacin; and Penicillins  Family History  Problem Relation Age of Onset  . Hypertension Mother   . Arthritis Mother   . Hypertension Father   . Arthritis Father   . Prostate cancer Father   . Ovarian cancer Maternal Grandmother     Social History Social History  Tobacco Use  . Smoking status: Current Every Day Smoker    Packs/day: 0.50    Years: 14.00    Pack years: 7.00    Types: Cigarettes    Last attempt to quit: 05/07/2016    Years since quitting: 2.7  . Smokeless tobacco: Never Used  . Tobacco comment: 11-25-18 reports  down to just 1 cigarette per day   Substance Use Topics  . Alcohol use: Yes    Alcohol/week: 0.0 standard drinks    Comment: seldom   . Drug use: No    Review of Systems  Constitutional: No fever/chills Eyes: No visual changes. ENT: No sore throat.  Bilateral ear pain Respiratory: Denies cough Genitourinary: Negative for dysuria. Musculoskeletal: Negative for back pain. Skin: Negative for rash.    ____________________________________________   PHYSICAL EXAM:  VITAL SIGNS: ED Triage Vitals [02/16/19 1339]  Enc Vitals Group     BP      Pulse      Resp      Temp      Temp src      SpO2      Weight 249 lb (112.9 kg)     Height 5\' 6"  (1.676 m)     Head Circumference      Peak Flow      Pain Score 10     Pain Loc      Pain Edu?      Excl. in GC?     Constitutional: Alert and oriented. Well appearing and  in no acute distress. Eyes: Conjunctivae are normal.  Head: Atraumatic. Ears: TMs are swollen and dull bilaterally, ear canals appear normal Nose: No congestion/rhinnorhea. Mouth/Throat: Mucous membranes are moist.   Neck:  supple no lymphadenopathy noted Cardiovascular: Normal rate, regular rhythm. Heart sounds are normal Respiratory: Normal respiratory effort.  No retractions, lungs c t a  GU: deferred Musculoskeletal: FROM all extremities, warm and well perfused Neurologic:  Normal speech and language.  Skin:  Skin is warm, dry and intact. No rash noted. Psychiatric: Mood and affect are normal. Speech and behavior are normal.  ____________________________________________   LABS (all labs ordered are listed, but only abnormal results are displayed)  Labs Reviewed - No data to display ____________________________________________   ____________________________________________  RADIOLOGY    ____________________________________________   PROCEDURES  Procedure(s) performed: No  Procedures    ____________________________________________   INITIAL IMPRESSION / ASSESSMENT AND PLAN / ED COURSE  Pertinent labs & imaging results that were available during my care of the patient were reviewed by me and considered in my medical decision making (see chart for details).   Patient is a 53 year old female presents emergency department ear pressure  Physical exam shows that both TMs are swollen and dull  Explained findings to the patient.  She was given a prescription for Flonase and prednisone 30 mg daily for 3 days.  If she is not improving with this medication she can start the Z-Pak.  She states she understands and will comply.  She was discharged stable condition.       As part of my medical decision making, I reviewed the following data within the electronic MEDICAL RECORD NUMBER Nursing notes reviewed and incorporated, Old chart reviewed, Notes from prior ED visits and [place]  Controlled Substance Database  ____________________________________________   FINAL CLINICAL IMPRESSION(S) / ED DIAGNOSES  Final diagnoses:  Dysfunction of Eustachian tube, bilateral      NEW MEDICATIONS STARTED DURING THIS VISIT:  New Prescriptions   AZITHROMYCIN (ZITHROMAX  Z-PAK) 250 MG TABLET    2 pills today then 1 pill a day for 4 days   FLUTICASONE (FLONASE) 50 MCG/ACT NASAL SPRAY    Place 2 sprays into both nostrils daily.   PREDNISONE (DELTASONE) 10 MG TABLET    Take 3 tablets (30 mg total) by mouth daily with breakfast.     Note:  This document was prepared using Dragon voice recognition software and may include unintentional dictation errors.    Faythe Ghee, PA-C 02/16/19 1521    Rockne Menghini, MD 02/16/19 [phone]

## 2019-02-16 NOTE — ED Triage Notes (Signed)
Pt reports pain to both ears for about a month. Pt states has abx ear drops because they had fluid behind them but she thinks the fluid is worse.

## 2019-02-16 NOTE — Discharge Instructions (Addendum)
Follow-up with your regular doctor if not better in 3 to 5 days.  Return emergency department worsening.  Take medications as prescribed. °

## 2019-02-24 ENCOUNTER — Encounter: Payer: Self-pay | Admitting: Nurse Practitioner

## 2019-02-24 ENCOUNTER — Ambulatory Visit: Payer: Medicare Other | Admitting: Nurse Practitioner

## 2019-02-24 VITALS — BP 131/73 | HR 79 | Temp 99.4°F | Resp 16 | Ht 66.0 in | Wt 244.0 lb

## 2019-02-24 DIAGNOSIS — R6889 Other general symptoms and signs: Secondary | ICD-10-CM

## 2019-02-24 DIAGNOSIS — J449 Chronic obstructive pulmonary disease, unspecified: Secondary | ICD-10-CM

## 2019-02-24 DIAGNOSIS — J101 Influenza due to other identified influenza virus with other respiratory manifestations: Secondary | ICD-10-CM | POA: Diagnosis not present

## 2019-02-24 DIAGNOSIS — J069 Acute upper respiratory infection, unspecified: Secondary | ICD-10-CM | POA: Diagnosis not present

## 2019-02-24 LAB — POCT INFLUENZA A/B
INFLUENZA A, POC: POSITIVE — AB
Influenza B, POC: NEGATIVE

## 2019-02-24 MED ORDER — AZITHROMYCIN 250 MG PO TABS: ORAL_TABLET | ORAL | 0 refills | Status: DC

## 2019-02-24 MED ORDER — PREDNISONE 10 MG (21) PO TBPK: ORAL_TABLET | ORAL | 0 refills | Status: DC

## 2019-02-24 MED ORDER — OSELTAMIVIR PHOSPHATE 75 MG PO CAPS: 75.0000 mg | ORAL_CAPSULE | Freq: Two times a day (BID) | ORAL | 0 refills | Status: DC

## 2019-02-24 NOTE — Progress Notes (Signed)
[facility] [address] [place], [place] [address]  Internal MEDICINE  Office Visit Note  Patient Name: Nicole Wyatt  [id number]  [id number]  Date of Service: 02/24/2019   Pt is here for a sick visit.  Chief Complaint  Patient presents with  . Sinusitis  . Cough  . Chills     Sinusitis  This is a new problem. The current episode started yesterday. The problem has been gradually worsening since onset. The maximum temperature recorded prior to her arrival was 100.4 - 100.9 F. The fever has been present for 1 to 2 days. Her pain is at a severity of 3/10. Associated symptoms include chills, congestion, coughing, ear pain, headaches, a sore throat and swollen glands. Past treatments include nothing.        Current Medication:  Outpatient Encounter Medications as of 02/24/2019  Medication Sig Note  . albuterol (PROVENTIL HFA;VENTOLIN HFA) 108 (90 Base) MCG/ACT inhaler Inhale 2 puffs into the lungs every 4 (four) hours as needed for wheezing or shortness of breath.   Marland Kitchen albuterol (PROVENTIL) (2.5 MG/3ML) 0.083% nebulizer solution Take 3 mLs (2.5 mg total) by nebulization every 6 (six) hours as needed for wheezing or shortness of breath.   Marland Kitchen aspirin EC 81 MG tablet Take 1 tablet (81 mg total) by mouth 2 (two) times daily. For DVT prophylaxis for 30 days after surgery.   . docusate sodium (COLACE) 100 MG capsule Take 1 capsule (100 mg total) by mouth 2 (two) times daily. To prevent constipation while taking pain medication.   . fluticasone (FLONASE) 50 MCG/ACT nasal spray Place 2 sprays into both nostrils daily.   . furosemide (LASIX) 40 MG tablet Take 1 tablet (40 mg total) by mouth daily as needed for fluid.   . methocarbamol (ROBAXIN) 500 MG tablet Take 1 tablet (500 mg total) by mouth every 8 (eight) hours as needed for muscle spasms.   . mometasone (ASMANEX) 220 MCG/INH inhaler Inhale 2 puffs into the lungs daily as needed (for shortness of breath or wheezing).    .  mometasone-formoterol (DULERA) 100-5 MCG/ACT AERO Inhale 2 puffs into the lungs daily as needed for wheezing or shortness of breath.   . neomycin-polymyxin-hydrocortisone (CORTISPORIN) 3.5-10000-1 OTIC suspension Place 4 drops into both ears 2 (two) times daily.   . ondansetron (ZOFRAN) 4 MG tablet Take 1 tablet (4 mg total) by mouth every 8 (eight) hours as needed for nausea or vomiting.   . OXYGEN Inhale 2 L into the lungs continuous.  12/02/2018: Pt trying to wean off of oxygen, used last about 1 week ago from today, 12/02/18  . sertraline (ZOLOFT) 100 MG tablet Take 2 tablets (200 mg total) by mouth daily.   . tamsulosin (FLOMAX) 0.4 MG CAPS capsule Take 1 capsule (0.4 mg total) by mouth daily.   Marland Kitchen venlafaxine XR (EFFEXOR-XR) 75 MG 24 hr capsule Take 1 capsule (75 mg total) by mouth daily with breakfast.   . [DISCONTINUED] azithromycin (ZITHROMAX Z-PAK) 250 MG tablet 2 pills today then 1 pill a day for 4 days   . [DISCONTINUED] predniSONE (DELTASONE) 10 MG tablet Take 3 tablets (30 mg total) by mouth daily with breakfast.   . azithromycin (ZITHROMAX) 250 MG tablet z-pack - take as directed for 5 days   . famotidine (PEPCID) 20 MG tablet Take 1 tablet (20 mg total) by mouth 2 (two) times daily. For Gastroprotection   . gabapentin (NEURONTIN) 300 MG capsule Take 1 capsule (300 mg total) by mouth  3 (three) times daily for 14 days. For 2 weeks post op for pain.   Marland Kitchen oseltamivir (TAMIFLU) 75 MG capsule Take 1 capsule (75 mg total) by mouth 2 (two) times daily.   . predniSONE (STERAPRED UNI-PAK 21 TAB) 10 MG (21) TBPK tablet 6 day taper - take by mouth as directed for 6 days    No facility-administered encounter medications on file as of 02/24/2019.       Medical History: Past Medical History:  Diagnosis Date  . Abnormal Pap smear of cervix   . Anxiety   . Arthritis   . Bipolar 1 disorder (HCC)   . COPD (chronic obstructive pulmonary disease) (HCC)   . Depression   . Dyspnea    with exertion    . Kidney stone   . Oxygen dependent    patient uses 2L oz PRN ; reports hardly ever uses it unless very SOB    . Rocky [place] spotted fever 2018  . Sarcoidosis of lung (HCC)    managed by Dr Lindie Spruce   . Seizures (HCC) last seizure 05/2013  . Sleep apnea    uses CPAP nightly   . UTI (urinary tract infection) 11/13/2018   see ED visit in epic ; was dc'd with oral abx and reports completed and relief of sx      Today's Vitals   02/24/19 1348  BP: 131/73  Pulse: 79  Resp: 16  Temp: 99.4 F (37.4 C)  SpO2: 94%  Weight: 244 lb (110.7 kg)  Height: 5\' 6"  (1.676 m)   Body mass index is 39.38 kg/m.  Review of Systems  Constitutional: Positive for chills, fatigue and fever.  HENT: Positive for congestion, ear pain, postnasal drip, rhinorrhea, sinus pain, sore throat and voice change.   Respiratory: Positive for cough and wheezing.   Cardiovascular: Negative for chest pain and palpitations.  Gastrointestinal: Positive for nausea. Negative for vomiting.  Musculoskeletal: Positive for myalgias.  Allergic/Immunologic: Positive for environmental allergies.  Neurological: Positive for headaches.  Hematological: Positive for adenopathy.    Physical Exam Vitals signs and nursing note reviewed.  Constitutional:      General: She is not in acute distress.    Appearance: She is well-developed. She is ill-appearing. She is not diaphoretic.  HENT:     Head: Normocephalic and atraumatic.     Right Ear: Tympanic membrane is bulging.     Left Ear: Tympanic membrane is bulging.     Nose: Congestion and rhinorrhea present. Rhinorrhea is purulent.     Right Turbinates: Swollen.     Left Turbinates: Swollen.     Right Sinus: Maxillary sinus tenderness and frontal sinus tenderness present.     Left Sinus: Maxillary sinus tenderness and frontal sinus tenderness present.     Mouth/Throat:     Pharynx: Posterior oropharyngeal erythema present. No oropharyngeal exudate.  Eyes:     Pupils:  Pupils are equal, round, and reactive to light.  Neck:     Musculoskeletal: Normal range of motion and neck supple.     Thyroid: No thyromegaly.     Vascular: No JVD.     Trachea: No tracheal deviation.  Cardiovascular:     Rate and Rhythm: Normal rate and regular rhythm.     Heart sounds: Normal heart sounds. No murmur. No friction rub. No gallop.   Pulmonary:     Effort: Pulmonary effort is normal. No respiratory distress.     Breath sounds: Wheezing present. No rales.  Comments: Congested, productive cough present  Chest:     Chest wall: No tenderness.  Abdominal:     General: Bowel sounds are normal.     Palpations: Abdomen is soft.  Musculoskeletal: Normal range of motion.  Lymphadenopathy:     Cervical: No cervical adenopathy.  Skin:    General: Skin is warm and dry.  Neurological:     Mental Status: She is alert and oriented to person, place, and time.     Cranial Nerves: No cranial nerve deficit.  Psychiatric:        Behavior: Behavior normal.        Thought Content: Thought content normal.        Judgment: Judgment normal.    Assessment/Plan: 1. Influenza A - POCT Influenza A/B positive for influenza A. Start tamiflu  twice daily for 5 days. Rest and increase fluids. OTC medications recommended to treat acute symptoms.  - oseltamivir (TAMIFLU) 75 MG capsule; Take 1 capsule (75 mg total) by mouth 2 (two) times daily.  Dispense: 10 capsule; Refill: 0  2. Acute upper respiratory infection z-pack. Take as directed for 5 days. Rest and increase fluids. OTC medications recommended to treat acute symptoms.  - azithromycin (ZITHROMAX) 250 MG tablet; z-pack - take as directed for 5 days  Dispense: 6 tablet; Refill: 0  3. Chronic obstructive pulmonary disease, unspecified COPD type (HCC) Prednisone taper. Take as directed for 6 days. Continue to use inhalers as prescribed  - predniSONE (STERAPRED UNI-PAK 21 TAB) 10 MG (21) TBPK tablet; 6 day taper - take by mouth as  directed for 6 days  Dispense: 21 tablet; Refill: 0  General Counseling: Kelly verbalizes understanding of the findings of todays visit and agrees with plan of treatment. I have discussed any further diagnostic evaluation that may be needed or ordered today. We also reviewed her medications today. she has been encouraged to call the office with any questions or concerns that should arise related to todays visit.    Counseling:  Rest and increase fluids. Continue using OTC medication to control symptoms.   This patient was seen by Vincent Gros FNP Collaboration with Dr Lyndon Code as a part of collaborative care agreement  Orders Placed This Encounter  Procedures  . POCT Influenza A/B    Meds ordered this encounter  Medications  . azithromycin (ZITHROMAX) 250 MG tablet    Sig: z-pack - take as directed for 5 days    Dispense:  6 tablet    Refill:  0    Order Specific Question:   Supervising Provider    Answer:   Lyndon Code [[id number]]  . oseltamivir (TAMIFLU) 75 MG capsule    Sig: Take 1 capsule (75 mg total) by mouth 2 (two) times daily.    Dispense:  10 capsule    Refill:  0    Order Specific Question:   Supervising Provider    Answer:   Lyndon Code [[id number]]  . predniSONE (STERAPRED UNI-PAK 21 TAB) 10 MG (21) TBPK tablet    Sig: 6 day taper - take by mouth as directed for 6 days    Dispense:  21 tablet    Refill:  0    Order Specific Question:   Supervising Provider    Answer:   Lyndon Code [[id number]]    Time spent: 25 Minutes

## 2019-03-04 DIAGNOSIS — M1612 Unilateral primary osteoarthritis, left hip: Secondary | ICD-10-CM | POA: Diagnosis not present

## 2019-03-07 ENCOUNTER — Emergency Department: Payer: Medicare Other

## 2019-03-07 ENCOUNTER — Encounter: Payer: Self-pay | Admitting: Emergency Medicine

## 2019-03-07 ENCOUNTER — Other Ambulatory Visit: Payer: Self-pay

## 2019-03-07 ENCOUNTER — Inpatient Hospital Stay
Admission: EM | Admit: 2019-03-07 | Discharge: 2019-03-10 | DRG: 101 | Disposition: A | Discharge status: Home Health | Payer: Medicare Other | Attending: Internal Medicine | Admitting: Internal Medicine

## 2019-03-07 DIAGNOSIS — Z8261 Family history of arthritis: Secondary | ICD-10-CM

## 2019-03-07 DIAGNOSIS — D860 Sarcoidosis of lung: Secondary | ICD-10-CM | POA: Diagnosis not present

## 2019-03-07 DIAGNOSIS — Z6838 Body mass index (BMI) 38.0-38.9, adult: Secondary | ICD-10-CM | POA: Diagnosis not present

## 2019-03-07 DIAGNOSIS — R079 Chest pain, unspecified: Secondary | ICD-10-CM | POA: Diagnosis not present

## 2019-03-07 DIAGNOSIS — Z87892 Personal history of anaphylaxis: Secondary | ICD-10-CM

## 2019-03-07 DIAGNOSIS — Z7982 Long term (current) use of aspirin: Secondary | ICD-10-CM | POA: Diagnosis not present

## 2019-03-07 DIAGNOSIS — Z87440 Personal history of urinary (tract) infections: Secondary | ICD-10-CM

## 2019-03-07 DIAGNOSIS — R569 Unspecified convulsions: Secondary | ICD-10-CM

## 2019-03-07 DIAGNOSIS — J9611 Chronic respiratory failure with hypoxia: Secondary | ICD-10-CM | POA: Diagnosis present

## 2019-03-07 DIAGNOSIS — Z9851 Tubal ligation status: Secondary | ICD-10-CM | POA: Diagnosis not present

## 2019-03-07 DIAGNOSIS — Z5181 Encounter for therapeutic drug level monitoring: Secondary | ICD-10-CM | POA: Diagnosis not present

## 2019-03-07 DIAGNOSIS — M4807 Spinal stenosis, lumbosacral region: Secondary | ICD-10-CM | POA: Diagnosis not present

## 2019-03-07 DIAGNOSIS — Z79899 Other long term (current) drug therapy: Secondary | ICD-10-CM

## 2019-03-07 DIAGNOSIS — M1990 Unspecified osteoarthritis, unspecified site: Secondary | ICD-10-CM | POA: Diagnosis present

## 2019-03-07 DIAGNOSIS — Z7951 Long term (current) use of inhaled steroids: Secondary | ICD-10-CM

## 2019-03-07 DIAGNOSIS — M5136 Other intervertebral disc degeneration, lumbar region: Secondary | ICD-10-CM | POA: Diagnosis present

## 2019-03-07 DIAGNOSIS — F17210 Nicotine dependence, cigarettes, uncomplicated: Secondary | ICD-10-CM | POA: Diagnosis present

## 2019-03-07 DIAGNOSIS — F319 Bipolar disorder, unspecified: Secondary | ICD-10-CM | POA: Diagnosis present

## 2019-03-07 DIAGNOSIS — Z880 Allergy status to penicillin: Secondary | ICD-10-CM

## 2019-03-07 DIAGNOSIS — Z23 Encounter for immunization: Secondary | ICD-10-CM | POA: Diagnosis not present

## 2019-03-07 DIAGNOSIS — N390 Urinary tract infection, site not specified: Secondary | ICD-10-CM | POA: Diagnosis present

## 2019-03-07 DIAGNOSIS — Z881 Allergy status to other antibiotic agents status: Secondary | ICD-10-CM

## 2019-03-07 DIAGNOSIS — R202 Paresthesia of skin: Secondary | ICD-10-CM | POA: Diagnosis not present

## 2019-03-07 DIAGNOSIS — J449 Chronic obstructive pulmonary disease, unspecified: Secondary | ICD-10-CM | POA: Diagnosis not present

## 2019-03-07 DIAGNOSIS — R29898 Other symptoms and signs involving the musculoskeletal system: Secondary | ICD-10-CM | POA: Diagnosis not present

## 2019-03-07 DIAGNOSIS — Z90710 Acquired absence of both cervix and uterus: Secondary | ICD-10-CM

## 2019-03-07 DIAGNOSIS — F331 Major depressive disorder, recurrent, moderate: Secondary | ICD-10-CM | POA: Diagnosis not present

## 2019-03-07 DIAGNOSIS — J9811 Atelectasis: Secondary | ICD-10-CM | POA: Diagnosis not present

## 2019-03-07 DIAGNOSIS — G4089 Other seizures: Secondary | ICD-10-CM | POA: Diagnosis not present

## 2019-03-07 DIAGNOSIS — M5127 Other intervertebral disc displacement, lumbosacral region: Secondary | ICD-10-CM | POA: Diagnosis not present

## 2019-03-07 DIAGNOSIS — I639 Cerebral infarction, unspecified: Secondary | ICD-10-CM | POA: Diagnosis not present

## 2019-03-07 DIAGNOSIS — R531 Weakness: Secondary | ICD-10-CM | POA: Diagnosis present

## 2019-03-07 DIAGNOSIS — E162 Hypoglycemia, unspecified: Secondary | ICD-10-CM | POA: Diagnosis present

## 2019-03-07 DIAGNOSIS — Z808 Family history of malignant neoplasm of other organs or systems: Secondary | ICD-10-CM

## 2019-03-07 DIAGNOSIS — R9431 Abnormal electrocardiogram [ECG] [EKG]: Secondary | ICD-10-CM | POA: Diagnosis not present

## 2019-03-07 DIAGNOSIS — G4733 Obstructive sleep apnea (adult) (pediatric): Secondary | ICD-10-CM | POA: Diagnosis present

## 2019-03-07 DIAGNOSIS — Z9981 Dependence on supplemental oxygen: Secondary | ICD-10-CM | POA: Diagnosis not present

## 2019-03-07 DIAGNOSIS — Z8041 Family history of malignant neoplasm of ovary: Secondary | ICD-10-CM

## 2019-03-07 DIAGNOSIS — G40909 Epilepsy, unspecified, not intractable, without status epilepticus: Principal | ICD-10-CM | POA: Diagnosis present

## 2019-03-07 DIAGNOSIS — F4010 Social phobia, unspecified: Secondary | ICD-10-CM | POA: Diagnosis present

## 2019-03-07 DIAGNOSIS — R0602 Shortness of breath: Secondary | ICD-10-CM | POA: Diagnosis not present

## 2019-03-07 DIAGNOSIS — R4182 Altered mental status, unspecified: Secondary | ICD-10-CM | POA: Diagnosis not present

## 2019-03-07 DIAGNOSIS — Z96642 Presence of left artificial hip joint: Secondary | ICD-10-CM | POA: Diagnosis present

## 2019-03-07 DIAGNOSIS — Z8042 Family history of malignant neoplasm of prostate: Secondary | ICD-10-CM

## 2019-03-07 DIAGNOSIS — Z91030 Bee allergy status: Secondary | ICD-10-CM

## 2019-03-07 DIAGNOSIS — I4891 Unspecified atrial fibrillation: Secondary | ICD-10-CM | POA: Diagnosis not present

## 2019-03-07 DIAGNOSIS — Z825 Family history of asthma and other chronic lower respiratory diseases: Secondary | ICD-10-CM

## 2019-03-07 DIAGNOSIS — R4020 Unspecified coma: Secondary | ICD-10-CM | POA: Diagnosis not present

## 2019-03-07 DIAGNOSIS — Z87442 Personal history of urinary calculi: Secondary | ICD-10-CM

## 2019-03-07 LAB — URINALYSIS, COMPLETE (UACMP) WITH MICROSCOPIC
Bacteria, UA: NONE SEEN
Bilirubin Urine: NEGATIVE
Glucose, UA: 50 mg/dL — AB
Ketones, ur: NEGATIVE mg/dL
Nitrite: NEGATIVE
PH: 7 (ref 5.0–8.0)
Protein, ur: NEGATIVE mg/dL
Specific Gravity, Urine: 1.012 (ref 1.005–1.030)
Squamous Epithelial / HPF: NONE SEEN (ref 0–5)

## 2019-03-07 LAB — COMPREHENSIVE METABOLIC PANEL
ALT: 12 U/L (ref 0–44)
AST: 20 U/L (ref 15–41)
Albumin: 4 g/dL (ref 3.5–5.0)
Alkaline Phosphatase: 76 U/L (ref 38–126)
Anion gap: 10 (ref 5–15)
BUN: 11 mg/dL (ref 6–20)
CO2: 24 mmol/L (ref 22–32)
Calcium: 8.8 mg/dL — ABNORMAL LOW (ref 8.9–10.3)
Chloride: 105 mmol/L (ref 98–111)
Creatinine, Ser: 0.61 mg/dL (ref 0.44–1.00)
GFR calc non Af Amer: >60 mL/min (ref >60)
Glucose, Bld: 86 mg/dL (ref 70–99)
Potassium: 3.5 mmol/L (ref 3.5–5.1)
Sodium: 139 mmol/L (ref 135–145)
Total Bilirubin: 0.8 mg/dL (ref 0.3–1.2)
Total Protein: 7.7 g/dL (ref 6.5–8.1)

## 2019-03-07 LAB — CBC WITH DIFFERENTIAL/PLATELET
Abs Immature Granulocytes: 0.03 10*3/uL (ref 0.00–0.07)
Basophils Absolute: 0 10*3/uL (ref 0.0–0.1)
Basophils Relative: 0 %
Eosinophils Absolute: 0.1 10*3/uL (ref 0.0–0.5)
Eosinophils Relative: 1 %
HEMATOCRIT: 40 % (ref 36.0–46.0)
Hemoglobin: 13.3 g/dL (ref 12.0–15.0)
Immature Granulocytes: 1 %
Lymphocytes Relative: 36 %
Lymphs Abs: 2.4 10*3/uL (ref 0.7–4.0)
MCH: 29.9 pg (ref 26.0–34.0)
MCHC: 33.3 g/dL (ref 30.0–36.0)
MCV: 89.9 fL (ref 80.0–100.0)
MONO ABS: 0.8 10*3/uL (ref 0.1–1.0)
MONOS PCT: 12 %
Neutro Abs: 3.4 10*3/uL (ref 1.7–7.7)
Neutrophils Relative %: 50 %
Platelets: 300 10*3/uL (ref 150–400)
RBC: 4.45 MIL/uL (ref 3.87–5.11)
RDW: 15.8 % — ABNORMAL HIGH (ref 11.5–15.5)
WBC: 6.6 10*3/uL (ref 4.0–10.5)
nRBC: 0 % (ref 0.0–0.2)

## 2019-03-07 LAB — URINE DRUG SCREEN, QUALITATIVE (ARMC ONLY)
Amphetamines, Ur Screen: NOT DETECTED
Barbiturates, Ur Screen: NOT DETECTED
Benzodiazepine, Ur Scrn: NOT DETECTED
Cannabinoid 50 Ng, Ur ~~LOC~~: POSITIVE — AB
Cocaine Metabolite,Ur ~~LOC~~: NOT DETECTED
MDMA (Ecstasy)Ur Screen: NOT DETECTED
Methadone Scn, Ur: NOT DETECTED
Opiate, Ur Screen: NOT DETECTED
Phencyclidine (PCP) Ur S: NOT DETECTED
Tricyclic, Ur Screen: NOT DETECTED

## 2019-03-07 LAB — GLUCOSE, CAPILLARY
Glucose-Capillary: 126 mg/dL — ABNORMAL HIGH (ref 70–99)
Glucose-Capillary: 12 mg/dL — CL (ref 70–99)
Glucose-Capillary: 244 mg/dL — ABNORMAL HIGH (ref 70–99)
Glucose-Capillary: 80 mg/dL (ref 70–99)
Glucose-Capillary: 76 mg/dL (ref 70–99)

## 2019-03-07 LAB — TROPONIN I
Troponin I: <0.03 ng/mL (ref <0.03)
Troponin I: <0.03 ng/mL (ref <0.03)

## 2019-03-07 LAB — PROTIME-INR
INR: 1 (ref 0.8–1.2)
PROTHROMBIN TIME: 12.8 s (ref 11.4–15.2)

## 2019-03-07 LAB — ETHANOL: Alcohol, Ethyl (B): <10 mg/dL (ref <10)

## 2019-03-07 LAB — APTT: aPTT: 30 seconds (ref 24–36)

## 2019-03-07 MED ORDER — ASPIRIN EC 325 MG PO TBEC
325.0000 mg | DELAYED_RELEASE_TABLET | Freq: Every day | ORAL | Status: DC
  Administered 2019-03-08 – 2019-03-10 (×3): 325 mg via ORAL
  Filled 2019-03-07 (×3): qty 1

## 2019-03-07 MED ORDER — STROKE: EARLY STAGES OF RECOVERY BOOK
Freq: Once | Status: AC
  Administered 2019-03-07: 23:00:00

## 2019-03-07 MED ORDER — DEXTROSE 50 % IV SOLN
INTRAVENOUS | Status: AC
  Filled 2019-03-07: qty 50

## 2019-03-07 MED ORDER — DEXTROSE 10 % IV SOLN
INTRAVENOUS | Status: DC
  Administered 2019-03-07: 20:00:00 via INTRAVENOUS

## 2019-03-07 MED ORDER — DOCUSATE SODIUM 100 MG PO CAPS
100.0000 mg | ORAL_CAPSULE | Freq: Two times a day (BID) | ORAL | Status: DC
  Administered 2019-03-08 – 2019-03-10 (×4): 100 mg via ORAL
  Filled 2019-03-07 (×5): qty 1

## 2019-03-07 MED ORDER — DEXTROSE 50 % IV SOLN
50.0000 mL | Freq: Once | INTRAVENOUS | Status: AC
  Administered 2019-03-07: 50 mL via INTRAVENOUS

## 2019-03-07 MED ORDER — IOPAMIDOL (ISOVUE-370) INJECTION 76%
100.0000 mL | Freq: Once | INTRAVENOUS | Status: AC | PRN
  Administered 2019-03-07: 100 mL via INTRAVENOUS

## 2019-03-07 MED ORDER — TAMSULOSIN HCL 0.4 MG PO CAPS
0.4000 mg | ORAL_CAPSULE | Freq: Every day | ORAL | Status: DC
  Administered 2019-03-08 – 2019-03-10 (×3): 0.4 mg via ORAL
  Filled 2019-03-07 (×3): qty 1

## 2019-03-07 MED ORDER — SENNOSIDES-DOCUSATE SODIUM 8.6-50 MG PO TABS: 1.0000 | ORAL_TABLET | Freq: Every evening | ORAL | Status: DC | PRN

## 2019-03-07 MED ORDER — FLUTICASONE PROPIONATE 50 MCG/ACT NA SUSP
2.0000 | Freq: Every day | NASAL | Status: DC
  Administered 2019-03-08 – 2019-03-10 (×3): 2 via NASAL
  Filled 2019-03-07: qty 16

## 2019-03-07 MED ORDER — IOHEXOL 350 MG/ML SOLN
100.0000 mL | Freq: Once | INTRAVENOUS | Status: AC | PRN
  Administered 2019-03-07: 100 mL via INTRAVENOUS

## 2019-03-07 MED ORDER — DEXTROSE 50 % IV SOLN
INTRAVENOUS | Status: AC
  Filled 2019-03-07: qty 100

## 2019-03-07 MED ORDER — ACETAMINOPHEN 325 MG PO TABS
650.0000 mg | ORAL_TABLET | ORAL | Status: DC | PRN
  Administered 2019-03-08 (×2): 650 mg via ORAL
  Filled 2019-03-07 (×3): qty 2

## 2019-03-07 MED ORDER — FUROSEMIDE 40 MG PO TABS: 40.0000 mg | ORAL_TABLET | Freq: Every day | ORAL | Status: DC | PRN

## 2019-03-07 MED ORDER — LEVETIRACETAM IN NACL 1000 MG/100ML IV SOLN
1000.0000 mg | Freq: Once | INTRAVENOUS | Status: AC
  Administered 2019-03-07: 1000 mg via INTRAVENOUS
  Filled 2019-03-07: qty 100

## 2019-03-07 MED ORDER — BUDESONIDE 0.25 MG/2ML IN SUSP: 0.2500 mg | Freq: Every day | RESPIRATORY_TRACT | Status: DC | PRN

## 2019-03-07 MED ORDER — ASPIRIN 300 MG RE SUPP: 300.0000 mg | Freq: Every day | RECTAL | Status: DC

## 2019-03-07 MED ORDER — ENOXAPARIN SODIUM 40 MG/0.4ML ~~LOC~~ SOLN
40.0000 mg | SUBCUTANEOUS | Status: DC
  Administered 2019-03-08 – 2019-03-09 (×2): 40 mg via SUBCUTANEOUS
  Filled 2019-03-07 (×3): qty 0.4

## 2019-03-07 MED ORDER — IPRATROPIUM-ALBUTEROL 0.5-2.5 (3) MG/3ML IN SOLN: 3.0000 mL | RESPIRATORY_TRACT | Status: DC | PRN

## 2019-03-07 MED ORDER — DEXTROSE 10 % IV SOLN
INTRAVENOUS | Status: AC
  Administered 2019-03-08: 10:00:00 via INTRAVENOUS
  Filled 2019-03-07: qty 1000

## 2019-03-07 MED ORDER — SODIUM CHLORIDE 0.9 % IV BOLUS: 500.0000 mL | Freq: Once | INTRAVENOUS | Status: DC

## 2019-03-07 MED ORDER — ALBUTEROL SULFATE (2.5 MG/3ML) 0.083% IN NEBU: 2.5000 mg | INHALATION_SOLUTION | Freq: Four times a day (QID) | RESPIRATORY_TRACT | Status: DC | PRN

## 2019-03-07 MED ORDER — INSULIN ASPART 100 UNIT/ML ~~LOC~~ SOLN
0.0000 [IU] | Freq: Three times a day (TID) | SUBCUTANEOUS | Status: DC
  Administered 2019-03-08: 13:00:00 1 [IU] via SUBCUTANEOUS
  Filled 2019-03-07: qty 1

## 2019-03-07 MED ORDER — ACETAMINOPHEN 160 MG/5ML PO SOLN
650.0000 mg | ORAL | Status: DC | PRN
  Filled 2019-03-07: qty 20.3

## 2019-03-07 MED ORDER — ACETAMINOPHEN 650 MG RE SUPP: 650.0000 mg | RECTAL | Status: DC | PRN

## 2019-03-07 MED ORDER — DEXTROSE 50 % IV SOLN
1.0000 | Freq: Once | INTRAVENOUS | Status: AC
  Administered 2019-03-07: 50 mL via INTRAVENOUS
  Filled 2019-03-07: qty 50

## 2019-03-07 MED ORDER — INSULIN ASPART 100 UNIT/ML ~~LOC~~ SOLN: 0.0000 [IU] | Freq: Every day | SUBCUTANEOUS | Status: DC

## 2019-03-07 MED ORDER — FAMOTIDINE 20 MG PO TABS
20.0000 mg | ORAL_TABLET | Freq: Two times a day (BID) | ORAL | Status: DC
  Administered 2019-03-08 – 2019-03-10 (×5): 20 mg via ORAL
  Filled 2019-03-07 (×5): qty 1

## 2019-03-07 MED ORDER — MOMETASONE FURO-FORMOTEROL FUM 100-5 MCG/ACT IN AERO
2.0000 | INHALATION_SPRAY | Freq: Two times a day (BID) | RESPIRATORY_TRACT | Status: DC
  Administered 2019-03-08 – 2019-03-10 (×5): 2 via RESPIRATORY_TRACT
  Filled 2019-03-07: qty 8.8

## 2019-03-07 MED ORDER — LEVETIRACETAM IN NACL 500 MG/100ML IV SOLN
500.0000 mg | Freq: Two times a day (BID) | INTRAVENOUS | Status: DC
  Administered 2019-03-08: 500 mg via INTRAVENOUS
  Filled 2019-03-07 (×2): qty 100

## 2019-03-07 NOTE — ED Notes (Signed)
Ann RN, aware of bed assigned 

## 2019-03-07 NOTE — ED Triage Notes (Addendum)
Pt called EMS for chest pain. NTG X 1 by EMS and 324 mg ASA.  Pt hypoglycemic here.  Appears to have right facial droop. Unable to hold either leg against gravity. Weak grip bilateral. Seizure hx. Pt became unresponsive on way to hospital. Per med list pt wears 2 L [place] at home. Incontinence noted.

## 2019-03-07 NOTE — ED Notes (Signed)
Dr Katheren Shams informed of pt failing Swallow Screen performed by this RN

## 2019-03-07 NOTE — ED Notes (Signed)
Patient transported to CT with RN and monitor. 

## 2019-03-07 NOTE — ED Notes (Signed)
Pt now able to move legs. Able to raise left arm off bed some.

## 2019-03-07 NOTE — ED Notes (Addendum)
Held to go for CTA per neuro RN on consult until neurologist comes on screen

## 2019-03-07 NOTE — ED Notes (Signed)
Pt now moving right leg. Still unable to move left leg. Left arm has more movement than when previous NIH done

## 2019-03-07 NOTE — ED Notes (Addendum)
ED TO INPATIENT HANDOFF REPORT  ED Nurse Name and Phone #: Dewayne Hatchnn [phone]  S Name/Age/Gender Nicole ScalesSelena L Wyatt 53 y.o. female Room/Bed: ED24A/ED24A  Code Status   Code Status: Prior  Home/SNF/Other Home Patient oriented to: self, place, time and situation Is this baseline? Yes   Triage Complete: Triage complete  Chief Complaint Unresponsive  Triage Note Pt called EMS for chest pain. NTG X 1 by EMS and 324 mg ASA.  Pt hypoglycemic here.  Appears to have right facial droop. Unable to hold either leg against gravity. Weak grip bilateral. Seizure hx. Pt became unresponsive on way to hospital. Per med list pt wears 2 L [place] at home. Incontinence noted.     Allergies Allergies  Allergen Reactions  . Bee Venom Anaphylaxis  . Ciprofloxacin Nausea Only  . Penicillins Rash and Other (See Comments)    Has patient had a PCN reaction causing immediate rash, facial/tongue/throat swelling, SOB or lightheadedness with hypotension: no Has patient had a PCN reaction causing severe rash involving mucus membranes or skin necrosis: no Has patient had a PCN reaction that required hospitalization no Has patient had a PCN reaction occurring within the last 10 years: {yes If all of the above answers are "NO", then may proceed with Cephalosporin use.     Level of Care/Admitting Diagnosis ED Disposition    ED Disposition Condition Comment   Admit  Hospital Area: [facility] [[id number]]  Level of Care: Med-Surg [16]  Diagnosis: Arm weakness [322012]  Admitting Physician: Bertrum SolSALARY, MONTELL D [[id number]]  Attending Physician: Bertrum SolSALARY, MONTELL D [[id number]]  Estimated length of stay: past midnight tomorrow  Certification:: I certify this patient will need inpatient services for at least 2 midnights  PT Class (Do Not Modify): Inpatient [101]  PT Acc Code (Do Not Modify): Private [1]       B Medical/Surgery History Past Medical History:  Diagnosis Date  . Abnormal Pap smear of cervix    . Anxiety   . Arthritis   . Bipolar 1 disorder (HCC)   . COPD (chronic obstructive pulmonary disease) (HCC)   . Depression   . Dyspnea    with exertion   . Kidney stone   . Oxygen dependent    patient uses 2L oz PRN ; reports hardly ever uses it unless very SOB    . [place] spotted fever 2018  . Sarcoidosis of lung (HCC)    managed by Dr Lindie SpruceSaddat   . Seizures (HCC) last seizure 05/2013  . Sleep apnea    uses CPAP nightly   . UTI (urinary tract infection) 11/13/2018   see ED visit in epic ; was dc'd with oral abx and reports completed and relief of sx    Past Surgical History:  Procedure Laterality Date  . ABDOMINAL HYSTERECTOMY  1999  . BLADDER SURGERY    . CARDIAC CATHETERIZATION Bilateral 05/29/2016   Procedure: Right/Left Heart Cath and Coronary Angiography;  Surgeon: Lamar BlinksBruce J Kowalski, MD;  Location: [facility] INVASIVE CV LAB;  Service: Cardiovascular;  Laterality: Bilateral;  . COLONOSCOPY    . COLONOSCOPY N/A 07/19/2015   Procedure: COLONOSCOPY;  Surgeon: Earline MayotteJeffrey W Byrnett, MD;  [facility];  Service: Endoscopy;  Laterality: N/A;  . FLEXIBLE BRONCHOSCOPY N/A 07/27/2016   Procedure: FLEXIBLE BRONCHOSCOPY;  Surgeon: Yevonne PaxSaadat A Khan, MD;  Location: [facility] ORS;  Service: Pulmonary;  Laterality: N/A;  . FOOT SURGERY    . KIDNEY STONE SURGERY  7 years  . TOTAL HIP ARTHROPLASTY Left 12/02/2018  Procedure: TOTAL HIP ARTHROPLASTY ANTERIOR APPROACH;  Surgeon: Sheral Apley, MD;  Location: [facility] ORS;  Service: Orthopedics;  Laterality: Left;  . TUBAL LIGATION       A IV Location/Drains/Wounds Patient Lines/Drains/Airways Status   Active Line/Drains/Airways    Name:   Placement date:   Placement time:   Site:   Days:   Peripheral IV 03/07/19 Right Antecubital   03/07/19    1722    Antecubital   less than 1   Incision (Closed) 12/02/18 Hip Left   12/02/18    0853     95          Intake/Output Last 24 hours No intake or output data in the 24 hours ending 03/07/19  1958  Labs/Imaging Results for orders placed or performed during the hospital encounter of 03/07/19 (from the past 48 hour(s))  Protime-INR     Status: None   Collection Time: 03/07/19  4:09 PM  Result Value Ref Range   Prothrombin Time 12.8 11.4 - 15.2 seconds   INR 1.0 0.8 - 1.2    Comment: (NOTE) INR goal varies based on device and disease states. Performed at [facility], [address] [place], [place] [address]   APTT     Status: None   Collection Time: 03/07/19  4:09 PM  Result Value Ref Range   aPTT 30 24 - 36 seconds    Comment: Performed at [facility], [address] [place], [place] [address]  Troponin I - Once     Status: None   Collection Time: 03/07/19  4:09 PM  Result Value Ref Range   Troponin I <0.03 <0.03 ng/mL    Comment: Performed at [facility], [address] [place], [place] [address]  Comprehensive metabolic panel     Status: Abnormal   Collection Time: 03/07/19  4:09 PM  Result Value Ref Range   Sodium 139 135 - 145 mmol/L   Potassium 3.5 3.5 - 5.1 mmol/L   Chloride 105 98 - 111 mmol/L   CO2 24 22 - 32 mmol/L   Glucose, Bld 86 70 - 99 mg/dL   BUN 11 6 - 20 mg/dL   Creatinine, Ser 3.82 0.44 - 1.00 mg/dL   Calcium 8.8 (L) 8.9 - 10.3 mg/dL   Total Protein 7.7 6.5 - 8.1 g/dL   Albumin 4.0 3.5 - 5.0 g/dL   AST 20 15 - 41 U/L   ALT 12 0 - 44 U/L   Alkaline Phosphatase 76 38 - 126 U/L   Total Bilirubin 0.8 0.3 - 1.2 mg/dL   GFR calc non Af Amer >60 >60 mL/min   GFR calc Af Amer >60 >60 mL/min   Anion gap 10 5 - 15    Comment: Performed at [facility], [address], [place], [place] [address]  Ethanol     Status: None   Collection Time: 03/07/19  4:09 PM  Result Value Ref Range   Alcohol, Ethyl (B) <10 <10 mg/dL    Comment: (NOTE) Lowest detectable limit for serum alcohol is 10 mg/dL. For medical purposes only. Performed at [facility], [address] [place], [place] [address]    Urinalysis, Complete w Microscopic     Status: Abnormal   Collection Time: 03/07/19  4:09 PM  Result Value Ref Range   Color, Urine STRAW (A) YELLOW   APPearance CLEAR (A) CLEAR   Specific Gravity, Urine 1.012 1.005 - 1.030   pH 7.0 5.0 -  8.0   Glucose, UA 50 (A) NEGATIVE mg/dL   Hgb urine dipstick SMALL (A) NEGATIVE   Bilirubin Urine NEGATIVE NEGATIVE   Ketones, ur NEGATIVE NEGATIVE mg/dL   Protein, ur NEGATIVE NEGATIVE mg/dL   Nitrite NEGATIVE NEGATIVE   Leukocytes,Ua SMALL (A) NEGATIVE   RBC / HPF 0-5 0 - 5 RBC/hpf   WBC, UA 21-50 0 - 5 WBC/hpf   Bacteria, UA NONE SEEN NONE SEEN   Squamous Epithelial / LPF NONE SEEN 0 - 5    Comment: Performed at [facility], [address], [place], [place] [address]  Urine Drug Screen, Qualitative     Status: Abnormal   Collection Time: 03/07/19  4:09 PM  Result Value Ref Range   Tricyclic, Ur Screen NONE DETECTED NONE DETECTED   Amphetamines, Ur Screen NONE DETECTED NONE DETECTED   MDMA (Ecstasy)Ur Screen NONE DETECTED NONE DETECTED   Cocaine Metabolite,Ur [place] NONE DETECTED NONE DETECTED   Opiate, Ur Screen NONE DETECTED NONE DETECTED   Phencyclidine (PCP) Ur S NONE DETECTED NONE DETECTED   Cannabinoid 50 Ng, Ur  POSITIVE (A) NONE DETECTED   Barbiturates, Ur Screen NONE DETECTED NONE DETECTED   Benzodiazepine, Ur Scrn NONE DETECTED NONE DETECTED   Methadone Scn, Ur NONE DETECTED NONE DETECTED    Comment: (NOTE) Tricyclics + metabolites, urine    Cutoff 1000 ng/mL Amphetamines + metabolites, urine  Cutoff 1000 ng/mL MDMA (Ecstasy), urine              Cutoff 500 ng/mL Cocaine Metabolite, urine          Cutoff 300 ng/mL Opiate + metabolites, urine        Cutoff 300 ng/mL Phencyclidine (PCP), urine         Cutoff 25 ng/mL Cannabinoid, urine                 Cutoff 50 ng/mL Barbiturates + metabolites, urine  Cutoff 200 ng/mL Benzodiazepine, urine              Cutoff 200 ng/mL Methadone, urine                   Cutoff 300  ng/mL The urine drug screen provides only a preliminary, unconfirmed analytical test result and should not be used for non-medical purposes. Clinical consideration and professional judgment should be applied to any positive drug screen result due to possible interfering substances. A more specific alternate chemical method must be used in order to obtain a confirmed analytical result. Gas chromatography / mass spectrometry (GC/MS) is the preferred confirmat ory method. Performed at [facility] [address] [place], [place] [address]   CBC with Differential     Status: Abnormal   Collection Time: 03/07/19  4:09 PM  Result Value Ref Range   WBC 6.6 4.0 - 10.5 K/uL   RBC 4.45 3.87 - 5.11 MIL/uL   Hemoglobin 13.3 12.0 - 15.0 g/dL   HCT 09.8 11.9 - 14.7 %   MCV 89.9 80.0 - 100.0 fL   MCH 29.9 26.0 - 34.0 pg   MCHC 33.3 30.0 - 36.0 g/dL   RDW 82.9 (H) 56.2 - 13.0 %   Platelets 300 150 - 400 K/uL   nRBC 0.0 0.0 - 0.2 %   Neutrophils Relative % 50 %   Neutro Abs 3.4 1.7 - 7.7 K/uL   Lymphocytes Relative 36 %   Lymphs Abs 2.4 0.7 - 4.0 K/uL   Monocytes Relative 12 %   Monocytes  Absolute 0.8 0.1 - 1.0 K/uL   Eosinophils Relative 1 %   Eosinophils Absolute 0.1 0.0 - 0.5 K/uL   Basophils Relative 0 %   Basophils Absolute 0.0 0.0 - 0.1 K/uL   Immature Granulocytes 1 %   Abs Immature Granulocytes 0.03 0.00 - 0.07 K/uL    Comment: Performed at [facility], [address] [place], [place] [address]  Glucose, capillary     Status: Abnormal   Collection Time: 03/07/19  4:21 PM  Result Value Ref Range   Glucose-Capillary 126 (H) 70 - 99 mg/dL  Glucose, capillary     Status: None   Collection Time: 03/07/19  4:49 PM  Result Value Ref Range   Glucose-Capillary 80 70 - 99 mg/dL  Glucose, capillary     Status: Abnormal   Collection Time: 03/07/19  5:14 PM  Result Value Ref Range   Glucose-Capillary <10 (LL) 70 - 99 mg/dL  Glucose, capillary     Status: Abnormal    Collection Time: 03/07/19  5:15 PM  Result Value Ref Range   Glucose-Capillary 12 (LL) 70 - 99 mg/dL   Comment 1 Call MD NNP PA CNM   Glucose, capillary     Status: Abnormal   Collection Time: 03/07/19  5:17 PM  Result Value Ref Range   Glucose-Capillary 244 (H) 70 - 99 mg/dL  Glucose, capillary     Status: None   Collection Time: 03/07/19  6:53 PM  Result Value Ref Range   Glucose-Capillary 76 70 - 99 mg/dL   Ct Angio Head W Or Wo Contrast  Result Date: 03/07/2019 CLINICAL DATA:  53 year old female with weakness after seizure. Facial droop. EXAM: CT ANGIOGRAPHY HEAD AND NECK CT PERFUSION BRAIN TECHNIQUE: Multidetector CT imaging of the head and neck was performed using the standard protocol during bolus administration of intravenous contrast. Multiplanar CT image reconstructions and MIPs were obtained to evaluate the vascular anatomy. Carotid stenosis measurements (when applicable) are obtained utilizing NASCET criteria, using the distal internal carotid diameter as the denominator. Multiphase CT imaging of the brain was performed following IV bolus contrast injection. Subsequent parametric perfusion maps were calculated using RAPID software. CONTRAST:  OMNIPAQUE IOHEXOL 350 MG/ML SOLN COMPARISON:  Head CT without contrast 1630 hours today. CTA chest abdomen and pelvis earlier today. FINDINGS: CT Brain Perfusion Findings: CBF (<30%) Volume: None. Perfusion (Tmax>6.0s) volume: None. Mismatch Volume: Not applicable Infarction Location:Not applicable CTA NECK Skeleton: No acute osseous abnormality identified. Lower cervical spine disc and endplate degeneration. Upper chest: Mild dependent atelectasis. Other neck: Mild multinodular thyroid goiter. Aortic arch: Mildly bovine arch configuration. No arch atherosclerosis. Right carotid system: Mildly tortuous brachiocephalic and proximal right CCA. Negative right carotid bifurcation. Mildly tortuous cervical right ICA. Left carotid system: Negative  left CCA and left carotid bifurcation. Mildly tortuous cervical left ICA. Vertebral arteries: Proximal right subclavian artery and right vertebral artery origins appear normal. The right vertebral artery is patent to the skull base with no stenosis identified. Proximal left subclavian artery and left vertebral artery origins appear normal. The left vertebral artery is patent to the skull base with no stenosis identified. CTA HEAD Posterior circulation: Codominant and normal distal vertebral arteries. Patent PICA origins and vertebrobasilar junction. Patent basilar artery without stenosis. Normal SCA and left PCA origins. Fetal type right PCA origin. Bilateral PCA branches are within normal limits. Anterior circulation: Both ICA siphons are patent. On the left there is no plaque or stenosis. Normal left ophthalmic artery origin. On the right  there is no plaque or stenosis. Normal right ophthalmic and posterior communicating artery origins. Patent carotid termini. Normal MCA and ACA origins. Diminutive or absent anterior communicating artery. Bilateral ACA branches are within normal limits. Left MCA M1 segment and bifurcation are patent without stenosis. Left MCA branches are within normal limits. Right MCA M1 segment and bifurcation are patent without stenosis. Right MCA branches are within normal limits. Venous sinuses: Patent. Anatomic variants: Mildly bovine arch configuration. Fetal type right PCA origin. Delayed phase: No abnormal enhancement identified. Stable gray-white matter differentiation throughout the brain. Review of the MIP images confirms the above findings IMPRESSION: 1. Negative for large vessel occlusion, and CT Perfusion detects no core infarct or ischemic penumbra. 2. No atherosclerosis or arterial stenosis identified in the head or neck. 3. Stable and negative CT appearance of the brain since 1630 hours today. 4. Mild thyroid goiter. Electronically Signed   By: Odessa Fleming M.D.   On: 03/07/2019  18:50   Ct Head Wo Contrast  Result Date: 03/07/2019 CLINICAL DATA:  Altered mental status/unresponsive. Questionable facial droop EXAM: CT HEAD WITHOUT CONTRAST TECHNIQUE: Contiguous axial images were obtained from the base of the skull through the vertex without intravenous contrast. COMPARISON:  October 07, 2017 FINDINGS: Brain: The ventricles are not in size and configuration. There is no intracranial mass, hemorrhage, extra-axial fluid collection, or midline shift. The brain parenchyma appears unremarkable. No acute infarct evident. Vascular: There is no appreciable hyperdense vessel. There is no appreciable vascular calcification. Skull: Bony calvarium appears intact. Sinuses/Orbits: There is mucosal thickening in several ethmoid air cells as well as in portions of each sphenoid sinus. Visualized orbits appear symmetric bilaterally. Other: Visualized mastoid air cells are clear. IMPRESSION: Brain parenchyma appears unremarkable. No mass or hemorrhage. Foci of paranasal sinus disease noted. Electronically Signed   By: Bretta Bang III M.D.   On: 03/07/2019 16:53   Ct Angio Neck W And/or Wo Contrast  Result Date: 03/07/2019 CLINICAL DATA:  53 year old female with weakness after seizure. Facial droop. EXAM: CT ANGIOGRAPHY HEAD AND NECK CT PERFUSION BRAIN TECHNIQUE: Multidetector CT imaging of the head and neck was performed using the standard protocol during bolus administration of intravenous contrast. Multiplanar CT image reconstructions and MIPs were obtained to evaluate the vascular anatomy. Carotid stenosis measurements (when applicable) are obtained utilizing NASCET criteria, using the distal internal carotid diameter as the denominator. Multiphase CT imaging of the brain was performed following IV bolus contrast injection. Subsequent parametric perfusion maps were calculated using RAPID software. CONTRAST:  OMNIPAQUE IOHEXOL 350 MG/ML SOLN COMPARISON:  Head CT without contrast 1630 hours  today. CTA chest abdomen and pelvis earlier today. FINDINGS: CT Brain Perfusion Findings: CBF (<30%) Volume: None. Perfusion (Tmax>6.0s) volume: None. Mismatch Volume: Not applicable Infarction Location:Not applicable CTA NECK Skeleton: No acute osseous abnormality identified. Lower cervical spine disc and endplate degeneration. Upper chest: Mild dependent atelectasis. Other neck: Mild multinodular thyroid goiter. Aortic arch: Mildly bovine arch configuration. No arch atherosclerosis. Right carotid system: Mildly tortuous brachiocephalic and proximal right CCA. Negative right carotid bifurcation. Mildly tortuous cervical right ICA. Left carotid system: Negative left CCA and left carotid bifurcation. Mildly tortuous cervical left ICA. Vertebral arteries: Proximal right subclavian artery and right vertebral artery origins appear normal. The right vertebral artery is patent to the skull base with no stenosis identified. Proximal left subclavian artery and left vertebral artery origins appear normal. The left vertebral artery is patent to the skull base with no stenosis identified. CTA HEAD Posterior  circulation: Codominant and normal distal vertebral arteries. Patent PICA origins and vertebrobasilar junction. Patent basilar artery without stenosis. Normal SCA and left PCA origins. Fetal type right PCA origin. Bilateral PCA branches are within normal limits. Anterior circulation: Both ICA siphons are patent. On the left there is no plaque or stenosis. Normal left ophthalmic artery origin. On the right there is no plaque or stenosis. Normal right ophthalmic and posterior communicating artery origins. Patent carotid termini. Normal MCA and ACA origins. Diminutive or absent anterior communicating artery. Bilateral ACA branches are within normal limits. Left MCA M1 segment and bifurcation are patent without stenosis. Left MCA branches are within normal limits. Right MCA M1 segment and bifurcation are patent without  stenosis. Right MCA branches are within normal limits. Venous sinuses: Patent. Anatomic variants: Mildly bovine arch configuration. Fetal type right PCA origin. Delayed phase: No abnormal enhancement identified. Stable gray-white matter differentiation throughout the brain. Review of the MIP images confirms the above findings IMPRESSION: 1. Negative for large vessel occlusion, and CT Perfusion detects no core infarct or ischemic penumbra. 2. No atherosclerosis or arterial stenosis identified in the head or neck. 3. Stable and negative CT appearance of the brain since 1630 hours today. 4. Mild thyroid goiter. Electronically Signed   By: Odessa Fleming M.D.   On: 03/07/2019 18:50   Ct Cerebral Perfusion W Contrast  Result Date: 03/07/2019 CLINICAL DATA:  53 year old female with weakness after seizure. Facial droop. EXAM: CT ANGIOGRAPHY HEAD AND NECK CT PERFUSION BRAIN TECHNIQUE: Multidetector CT imaging of the head and neck was performed using the standard protocol during bolus administration of intravenous contrast. Multiplanar CT image reconstructions and MIPs were obtained to evaluate the vascular anatomy. Carotid stenosis measurements (when applicable) are obtained utilizing NASCET criteria, using the distal internal carotid diameter as the denominator. Multiphase CT imaging of the brain was performed following IV bolus contrast injection. Subsequent parametric perfusion maps were calculated using RAPID software. CONTRAST:  OMNIPAQUE IOHEXOL 350 MG/ML SOLN COMPARISON:  Head CT without contrast 1630 hours today. CTA chest abdomen and pelvis earlier today. FINDINGS: CT Brain Perfusion Findings: CBF (<30%) Volume: None. Perfusion (Tmax>6.0s) volume: None. Mismatch Volume: Not applicable Infarction Location:Not applicable CTA NECK Skeleton: No acute osseous abnormality identified. Lower cervical spine disc and endplate degeneration. Upper chest: Mild dependent atelectasis. Other neck: Mild multinodular thyroid  goiter. Aortic arch: Mildly bovine arch configuration. No arch atherosclerosis. Right carotid system: Mildly tortuous brachiocephalic and proximal right CCA. Negative right carotid bifurcation. Mildly tortuous cervical right ICA. Left carotid system: Negative left CCA and left carotid bifurcation. Mildly tortuous cervical left ICA. Vertebral arteries: Proximal right subclavian artery and right vertebral artery origins appear normal. The right vertebral artery is patent to the skull base with no stenosis identified. Proximal left subclavian artery and left vertebral artery origins appear normal. The left vertebral artery is patent to the skull base with no stenosis identified. CTA HEAD Posterior circulation: Codominant and normal distal vertebral arteries. Patent PICA origins and vertebrobasilar junction. Patent basilar artery without stenosis. Normal SCA and left PCA origins. Fetal type right PCA origin. Bilateral PCA branches are within normal limits. Anterior circulation: Both ICA siphons are patent. On the left there is no plaque or stenosis. Normal left ophthalmic artery origin. On the right there is no plaque or stenosis. Normal right ophthalmic and posterior communicating artery origins. Patent carotid termini. Normal MCA and ACA origins. Diminutive or absent anterior communicating artery. Bilateral ACA branches are within normal limits. Left MCA M1 segment  and bifurcation are patent without stenosis. Left MCA branches are within normal limits. Right MCA M1 segment and bifurcation are patent without stenosis. Right MCA branches are within normal limits. Venous sinuses: Patent. Anatomic variants: Mildly bovine arch configuration. Fetal type right PCA origin. Delayed phase: No abnormal enhancement identified. Stable gray-white matter differentiation throughout the brain. Review of the MIP images confirms the above findings IMPRESSION: 1. Negative for large vessel occlusion, and CT Perfusion detects no core  infarct or ischemic penumbra. 2. No atherosclerosis or arterial stenosis identified in the head or neck. 3. Stable and negative CT appearance of the brain since 1630 hours today. 4. Mild thyroid goiter. Electronically Signed   By: Odessa Fleming M.D.   On: 03/07/2019 18:50   Ct Angio Chest/abd/pel For Dissection W And/or Wo Contrast  Result Date: 03/07/2019 CLINICAL DATA:  Chest pain.  Unresponsive. EXAM: CT ANGIOGRAPHY CHEST, ABDOMEN AND PELVIS TECHNIQUE: Multidetector CT imaging through the chest, abdomen and pelvis was performed using the standard protocol during bolus administration of intravenous contrast. Multiplanar reconstructed images and MIPs were obtained and reviewed to evaluate the vascular anatomy. CONTRAST:  ISOVUE-370 IOPAMIDOL (ISOVUE-370) INJECTION 76% COMPARISON:  CT abdomen and pelvis 11/13/2018 FINDINGS: CTA CHEST FINDINGS Cardiovascular: Heart is normal size. Aorta is normal caliber. No dissection. Mediastinum/Nodes: No mediastinal, hilar, or axillary adenopathy. Lungs/Pleura: Dependent atelectasis in the lungs.  No effusions. Musculoskeletal: No acute bony abnormality. Review of the MIP images confirms the above findings. CTA ABDOMEN AND PELVIS FINDINGS VASCULAR Aorta: Normal caliber.  No aneurysm or dissection. Celiac: Widely patent SMA: Widely patent Renals: Single bilaterally, widely patent IMA: Widely patent Inflow: Patent.  No aneurysm or dissection. Veins: Grossly patent. Review of the MIP images confirms the above findings. NON-VASCULAR Hepatobiliary: Insert paddle biliary Pancreas: No focal abnormality or ductal dilatation. Spleen: No focal abnormality.  Normal size. Adrenals/Urinary Tract: No adrenal abnormality. No focal renal abnormality. No stones or hydronephrosis. Urinary bladder is unremarkable. Stomach/Bowel: Normal appendix. Stomach, large and small bowel grossly unremarkable. Lymphatic: Shotty retroperitoneal lymph nodes.  No adenopathy. Reproductive: Prior hysterectomy.   No adnexal masses. Other: No free fluid or free air. Musculoskeletal: No acute bony abnormality. Prior left hip replacement. Degenerative changes in the lumbar spine. Review of the MIP images confirms the above findings. IMPRESSION: No evidence of aortic aneurysm or dissection. Bibasilar atelectasis.  No acute cardiopulmonary disease. No acute findings in the abdomen or pelvis. Electronically Signed   By: Charlett Nose M.D.   On: 03/07/2019 16:56    Pending Labs Unresulted Labs (From admission, onward)    Start     Ordered   03/08/19 0500  Basic metabolic panel  Tomorrow morning,   STAT     03/07/19 1940   03/07/19 1954  Troponin I - Now Then Q6H  Now then every 6 hours,   STAT     03/07/19 1954   Signed and Held  HIV antibody (Routine Testing)  Once,   R     Signed and Held   Signed and Held  Hemoglobin A1c  Tomorrow morning,   R     Signed and Held   Signed and Held  Lipid panel  Tomorrow morning,   R    Comments:  Fasting    Signed and Held   Signed and Held  CBC  (enoxaparin (LOVENOX)    CrCl >/= 30 ml/min)  Once,   R    Comments:  Baseline for enoxaparin therapy IF NOT ALREADY DRAWN.  Notify  MD if PLT < 100 K.    Signed and Held   Signed and Held  Creatinine, serum  (enoxaparin (LOVENOX)    CrCl >/= 30 ml/min)  Once,   R    Comments:  Baseline for enoxaparin therapy IF NOT ALREADY DRAWN.    Signed and Held   Signed and Held  Creatinine, serum  (enoxaparin (LOVENOX)    CrCl >/= 30 ml/min)  Weekly,   R    Comments:  while on enoxaparin therapy    Signed and Held   Signed and Held  Hemoglobin A1c  Once,   R     Signed and Held          Vitals/Pain Today's Vitals   03/07/19 1730 03/07/19 1800 03/07/19 1830 03/07/19 1932  BP: 131/76 130/79 117/77 122/65  Pulse: 70 71 70 60  Resp: (!) 28  Temp:      TempSrc:      SpO2: 100% 100% 100% 100%  Weight:      Height:        Isolation Precautions No active isolations  Medications Medications   ipratropium-albuterol (DUONEB) 0.5-2.5 (3) MG/3ML nebulizer solution 3 mL (has no administration in time range)  levETIRAcetam (KEPPRA) IVPB 500 mg/100 mL premix (has no administration in time range)  dextrose 10 % infusion (has no administration in time range)  levETIRAcetam (KEPPRA) IVPB 1000 mg/100 mL premix (0 mg Intravenous Stopped 03/07/19 1721)  dextrose 50 % solution 50 mL (50 mLs Intravenous Given 03/07/19 1617)  iopamidol (ISOVUE-370) 76 % injection 100 mL (100 mLs Intravenous Contrast Given 03/07/19 1631)  dextrose 50 % solution 50 mL (50 mLs Intravenous Given 03/07/19 1658)  sodium chloride 0.9 % bolus 500 mL (500 mLs Intravenous New Bag/Given 03/07/19 1849)  iohexol (OMNIPAQUE) 350 MG/ML injection 100 mL (100 mLs Intravenous Contrast Given 03/07/19 1819)  dextrose 50 % solution 50 mL (50 mLs Intravenous Given 03/07/19 1921)    Mobility walks Moderate fall risk   Focused Assessments see neuro assessment   R Recommendations: See Admitting Provider Note  Report given to: Cherylynn Ridges, RN  Additional Notes: no

## 2019-03-07 NOTE — Consult Note (Signed)
[facility] Tele[facility]rology Consult Services   Date of Service:   03/07/2019 17:45:07  Impression:     .  Rule Out Acute Ischemic Stroke     .  vs post ictal deficits (more likely as seizure was witnessed and deficits are bilateral .     .       .    Comments/Sign-Out: Patient with history of seizure, COPD, bipolar, sarcoidosis. called EMS for chest pain, EMS gave nitro and aspirin, in ambulance had lip twitching and then went unresponsive at 15:54 and incontinence in ER. on exam has weakness on both sides (more pronounced on the left for arm and leg but for face its more pronounced on the right) appears to have some lateral visual field cut on both sides, presentation would be unusual for a stroke (unless she had bilateral Acute Ischemic Strokes) but more likely it is post ictal deficits, obtained STAT head and neck CTA and CTP brain, which did not show any Large Vessel Occlusion or core infarct. will be admitted for brain MRI, EEG  Mechanism of Stroke: Possible Thromboembolic Possible Cardioembolic Small Vessel Disease  Metrics: Last Known Well: 03/07/2019 15:54:00 [facility] Notification Time: 03/07/2019 17:45:07 Arrival Time: 03/07/2019 16:07:00 Stamp Time: 03/07/2019 17:45:07 Time First Login Attempt: 03/07/2019 17:51:04 Video Start Time: 03/07/2019 17:51:04  Symptoms: weakness, seizure NIHSS Start Assessment Time: 03/07/2019 17:55:27 Patient is not a candidate for tPA. Patient was not deemed candidate for tPA thrombolytics because of on exam has weakness on both sides (mor epronounced on the left for arm and leg but for face its more pronounced on the right) appears to have some lateral visual field cut on both sides, presentation would be unusual for a stroke , overall exam consistent with a postictal phenomenon rather than CVA. CTP brain did not show an infarct . Video End Time: 03/07/2019 18:04:07  CT head showed no acute hemorrhage or acute core  infarct. CT head was reviewed.  Clinical Presentation is Suggestive of Large Vessel Occlusive Disease, Recommendations are as Follows  CTA Head and Neck. CT Perfusion. Reviewed, No Indication of Large Vessel Occlusive Thrombus, Patient is not an NIR Candidate.   Radiologist was not called back for review of advanced imaging because report available ED Physician notified of diagnostic impression and management plan on 03/07/2019 18:07:41  Our recommendations are outlined below.  Recommendations:     .  Activate Stroke Protocol Admission/Order Set     .  Stroke/Telemetry Floor     .  Neuro Checks     .  Bedside Swallow Eval     .  DVT Prophylaxis     .  IV Fluids, Normal Saline     .  Head of Bed Below 30 Degrees     .  Euglycemia and Avoid Hyperthermia (PRN Acetaminophen)     .  Antiplatelet Therapy Recommended     .  noncontrast brain MRI     .  EEG  Routine Consultation with Inhouse Neurology for Follow up Care  Sign Out:     .  Discussed with Emergency Department Provider    ------------------------------------------------------------------------------  History of Present Illness: Patient is a 53 year old Female.  Patient was brought by EMS for symptoms of weakness, seizure  Patient with history of seizure, COPD, bipolar, sarcoidosis. last known well per EMS: 15:54 called EMS for chest pain, EMS gave nitro and aspirin, in ambulance had lip twitching and then went unresponsive at 15:54 and incontinence in ER. Received Keppra  in ER Head CT: No acute Intracranial abnormality. Blood glucose in ER 59 then 244 after D50  CT head showed no acute hemorrhage or acute core infarct. CT head was reviewed.  Last seen normal was within 4.5 hours.  Examination: 1A: Level of Consciousness - Alert; keenly responsive + 0 1B: Ask Month and Age - Both Questions Right + 0 1C: Blink Eyes & Squeeze Hands - Performs Both Tasks + 0 2: Test Horizontal Extraocular Movements - Normal +  0 3: Test Visual Fields - Complete Hemianopia + 2 4: Test Facial Palsy (Use Grimace if Obtunded) - Partial paralysis (lower face) + 2 5A: Test Left Arm Motor Drift - Drift, hits bed + 2 5B: Test Right Arm Motor Drift - Drift, but doesn't hit bed + 1 6A: Test Left Leg Motor Drift - Drift, hits bed + 2 6B: Test Right Leg Motor Drift - Drift, but doesn't hit bed + 1 7: Test Limb Ataxia (FNF/Heel-Shin) - No Ataxia + 0 8: Test Sensation - Mild-Moderate Loss: Less Sharp/More Dull + 1 9: Test Language/Aphasia - Normal; No aphasia + 0 10: Test Dysarthria - Mild-Moderate Dysarthria: Slurring but can be understood + 1 11: Test Extinction/Inattention - No abnormality + 0  NIHSS Score: 12  Patient was informed the Neurology Consult would happen via TeleHealth consult by way of interactive audio and video telecommunications and consented to receiving care in this manner.  Due to the immediate potential for life-threatening deterioration due to underlying acute neurologic illness, I spent 35 minutes providing critical care. This time includes time for face to face visit via telemedicine, review of medical records, imaging studies and discussion of findings with providers, the patient and/or family.   Dr Driscilla Grammes   [facility] [phone]   Case [id number]

## 2019-03-07 NOTE — Progress Notes (Signed)
   03/07/19 1753  Clinical Encounter Type  Visited With Patient not available  Visit Type Code (code stroke)   Chaplain responded to code stroke.  Patient and one staff member were in the room in conversation with video physician.  Chaplain to follow up later this evening.

## 2019-03-07 NOTE — Progress Notes (Signed)
Family Meeting Note  Advance Directive:yes  Today a meeting took place with the Patient.  Patient is able to participate   The following clinical team members were present during this meeting:MD  The following were discussed:Patient's diagnosis: left arm weakness, Patient's progosis: Unable to determine and Goals for treatment: Full Code  Additional follow-up to be provided: prn  Time spent during discussion:20 minutes  Bertrum Sol, MD

## 2019-03-07 NOTE — H&P (Signed)
[employer] - [place] [facility]   PATIENT NAME: Nicole Wyatt    MR#:  [id number]  DATE OF BIRTH:  1966-03-25  DATE OF ADMISSION:  03/07/2019  PRIMARY CARE PHYSICIAN: Carlean Jews, NP   REQUESTING/REFERRING PHYSICIAN:   CHIEF COMPLAINT:   Chief Complaint  Patient presents with  . Altered Mental Status  . Chest Pain    HISTORY OF PRESENT ILLNESS: Nicole Wyatt  is a 53 y.o. 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 Suanne Marker, developed acute left-sided chest pain, brought to the emergency room via EMS, while in route patient became hypoglycemic, developed altered mental status, twitching that was thought to be seizure activity per EMS, in the emergency room patient was found to have negative CT angiogram of the head and neck/CT angios of the chest/abdomen/pelvis, urine drug screen noted for marijuana, initial blood sugar was 59-treated with D10 IV fluids, noted bilateral extremity weakness with visual field cuts for which neurology did see patient while in the ER-recommended admission for further evaluation/care, patient evaluated in the emergency room, no apparent distress, resting comfortably in bed, patient only complaining of paresthesias in the left hand/forearm, left arm/leg weakness only, patient is now been admitted for acute left arm/leg weakness, acute hypoglycemia.  PAST MEDICAL HISTORY:   Past Medical History:  Diagnosis Date  . Abnormal Pap smear of cervix   . Anxiety   . Arthritis   . Bipolar 1 disorder (HCC)   . COPD (chronic obstructive pulmonary disease) (HCC)   . Depression   . Dyspnea    with exertion   . Kidney stone   . Oxygen dependent    patient uses 2L oz PRN ; reports hardly ever uses it unless very SOB    . [place] spotted fever 2018  . Sarcoidosis of lung (HCC)    managed by Dr Lindie Spruce   . Seizures (HCC) last seizure  05/2013  . Sleep apnea    uses CPAP nightly   . UTI (urinary tract infection) 11/13/2018   see ED visit in epic ; was dc'd with oral abx and reports completed and relief of sx     PAST SURGICAL HISTORY:  Past Surgical History:  Procedure Laterality Date  . ABDOMINAL HYSTERECTOMY  1999  . BLADDER SURGERY    . CARDIAC CATHETERIZATION Bilateral 05/29/2016   Procedure: Right/Left Heart Cath and Coronary Angiography;  Surgeon: Lamar Blinks, MD;  Location: [facility] INVASIVE CV LAB;  Service: Cardiovascular;  Laterality: Bilateral;  . COLONOSCOPY    . COLONOSCOPY N/A 07/19/2015   Procedure: COLONOSCOPY;  Surgeon: Earline Mayotte, MD;  Location: [facility] ENDOSCOPY;  Service: Endoscopy;  Laterality: N/A;  . FLEXIBLE BRONCHOSCOPY N/A 07/27/2016   Procedure: FLEXIBLE BRONCHOSCOPY;  Surgeon: Yevonne Pax, MD;  Location: [facility] ORS;  Service: Pulmonary;  Laterality: N/A;  . FOOT SURGERY    . KIDNEY STONE SURGERY  7 years  . TOTAL HIP ARTHROPLASTY Left 12/02/2018   Procedure: TOTAL HIP ARTHROPLASTY ANTERIOR APPROACH;  Surgeon: Sheral Apley, MD;  Location: [facility] ORS;  Service: Orthopedics;  Laterality: Left;  . TUBAL LIGATION      SOCIAL HISTORY:  Social History   Tobacco Use  . Smoking status: Current Every Day Smoker    Packs/day: 0.50    Years: 14.00    Pack years: 7.00    Types: Cigarettes    Last attempt to quit: 05/07/2016  Years since quitting: 2.8  . Smokeless tobacco: Never Used  . Tobacco comment: 11-25-18 reports  down to just 1 cigarette per day   Substance Use Topics  . Alcohol use: Yes    Alcohol/week: 0.0 standard drinks    Comment: seldom     FAMILY HISTORY:  Family History  Problem Relation Age of Onset  . Hypertension Mother   . Arthritis Mother   . Hypertension Father   . Arthritis Father   . Prostate cancer Father   . Ovarian cancer Maternal Grandmother     DRUG ALLERGIES:  Allergies  Allergen Reactions  . Bee Venom Anaphylaxis  . Ciprofloxacin Nausea Only   . Penicillins Rash and Other (See Comments)    Has patient had a PCN reaction causing immediate rash, facial/tongue/throat swelling, SOB or lightheadedness with hypotension: no Has patient had a PCN reaction causing severe rash involving mucus membranes or skin necrosis: no Has patient had a PCN reaction that required hospitalization no Has patient had a PCN reaction occurring within the last 10 years: {yes If all of the above answers are "NO", then may proceed with Cephalosporin use.     REVIEW OF SYSTEMS: Somewhat limited due to retrograde amnesia  CONSTITUTIONAL: No fever, fatigue or weakness.  EYES: No blurred or double vision.  EARS, NOSE, AND THROAT: No tinnitus or ear pain.  RESPIRATORY: No cough, shortness of breath, wheezing or hemoptysis.  CARDIOVASCULAR: + chest pain, no orthopnea, edema.  GASTROINTESTINAL: No nausea, vomiting, diarrhea or abdominal pain.  GENITOURINARY: No dysuria, hematuria.  ENDOCRINE: No polyuria, nocturia,  HEMATOLOGY: No anemia, easy bruising or bleeding SKIN: No rash or lesion. MUSCULOSKELETAL: No joint pain or arthritis.   NEUROLOGIC: Left arm/leg weakness, confusion PSYCHIATRY: No anxiety or depression.   MEDICATIONS AT HOME:  Prior to Admission medications   Medication Sig Start Date End Date Taking? Authorizing Provider  albuterol (PROVENTIL HFA;VENTOLIN HFA) 108 (90 Base) MCG/ACT inhaler Inhale 2 puffs into the lungs every 4 (four) hours as needed for wheezing or shortness of breath. 01/06/19   Carlean Jews, NP  albuterol (PROVENTIL) (2.5 MG/3ML) 0.083% nebulizer solution Take 3 mLs (2.5 mg total) by nebulization every 6 (six) hours as needed for wheezing or shortness of breath. 03/25/18   Carlean Jews, NP  aspirin EC 81 MG tablet Take 1 tablet (81 mg total) by mouth 2 (two) times daily. For DVT prophylaxis for 30 days after surgery. 12/02/18   Albina Billet III, PA-C  docusate sodium (COLACE) 100 MG capsule Take 1 capsule (100  mg total) by mouth 2 (two) times daily. To prevent constipation while taking pain medication. 12/02/18   Albina Billet III, PA-C  famotidine (PEPCID) 20 MG tablet Take 1 tablet (20 mg total) by mouth 2 (two) times daily. For Gastroprotection 12/02/18 01/01/19  Martensen, Lucretia Kern III, PA-C  fluticasone (FLONASE) 50 MCG/ACT nasal spray Place 2 sprays into both nostrils daily. 02/16/19   Fisher, Roselyn Bering, PA-C  furosemide (LASIX) 40 MG tablet Take 1 tablet (40 mg total) by mouth daily as needed for fluid. 01/06/19   Carlean Jews, NP  gabapentin (NEURONTIN) 300 MG capsule Take 1 capsule (300 mg total) by mouth 3 (three) times daily for 14 days. For 2 weeks post op for pain. 12/02/18 12/16/18  Albina Billet III, PA-C  methocarbamol (ROBAXIN) 500 MG tablet Take 1 tablet (500 mg total) by mouth every 8 (eight) hours as needed for muscle spasms. 12/02/18   Aquilla Hacker  Calvin III, PA-C  mometasone (ASMANEX) 220 MCG/INH inhaler Inhale 2 puffs into the lungs daily as needed (for shortness of breath or wheezing).     [provider]  mometasone-formoterol (DULERA) 100-5 MCG/ACT AERO Inhale 2 puffs into the lungs daily as needed for wheezing or shortness of breath.    [provider]  neomycin-polymyxin-hydrocortisone (CORTISPORIN) 3.5-10000-1 OTIC suspension Place 4 drops into both ears 2 (two) times daily. 01/06/19   Carlean Jews, NP  ondansetron (ZOFRAN) 4 MG tablet Take 1 tablet (4 mg total) by mouth every 8 (eight) hours as needed for nausea or vomiting. 12/02/18   Albina Billet III, PA-C  OXYGEN Inhale 2 L into the lungs continuous.     [provider]  sertraline (ZOLOFT) 100 MG tablet Take 2 tablets (200 mg total) by mouth daily. 01/06/19   Carlean Jews, NP  tamsulosin (FLOMAX) 0.4 MG CAPS capsule Take 1 capsule (0.4 mg total) by mouth daily. 11/13/18   Minna Antis, MD  venlafaxine XR (EFFEXOR-XR) 75 MG 24 hr capsule Take 1  capsule (75 mg total) by mouth daily with breakfast. 01/06/19   Carlean Jews, NP      PHYSICAL EXAMINATION:   VITAL SIGNS: Blood pressure 117/77, pulse 70, temperature 98.5 F (36.9 C), temperature source Axillary, resp. rate 19, height 5\' 7"  (1.702 m), weight 111.1 kg, SpO2 100 %.  GENERAL:  53 y.o.-year-old patient lying in the bed with no acute distress.  Obese EYES: Pupils equal, round, reactive to light and accommodation. No scleral icterus. Extraocular muscles intact.  HEENT: Head atraumatic, normocephalic. Oropharynx and nasopharynx clear.  NECK:  Supple, no jugular venous distention. No thyroid enlargement, no tenderness.  LUNGS: Normal breath sounds bilaterally, no wheezing, rales,rhonchi or crepitation. No use of accessory muscles of respiration.  CARDIOVASCULAR: S1, S2 normal. No murmurs, rubs, or gallops.  ABDOMEN: Soft, nontender, nondistended. Bowel sounds present. No organomegaly or mass.  EXTREMITIES: No pedal edema, cyanosis, or clubbing.  NEUROLOGIC: Cranial nerves II through XII are intact.  Left hemiparesis. Gait not checked.  PSYCHIATRIC: The patient is alert and oriented x 2-3.  SKIN: No obvious rash, lesion, or ulcer.   LABORATORY PANEL:   CBC Recent Labs  Lab 03/07/19 1609  WBC 6.6  HGB 13.3  HCT 40.0  PLT 300  MCV 89.9  MCH 29.9  MCHC 33.3  RDW 15.8*  LYMPHSABS 2.4  MONOABS 0.8  EOSABS 0.1  BASOSABS 0.0   ------------------------------------------------------------------------------------------------------------------  Chemistries  Recent Labs  Lab 03/07/19 1609  NA 139  K 3.5  CL 105  CO2 24  GLUCOSE 86  BUN 11  CREATININE 0.61  CALCIUM 8.8*  AST 20  ALT 12  ALKPHOS 76  BILITOT 0.8   ------------------------------------------------------------------------------------------------------------------ estimated creatinine clearance is 103.3 mL/min (by C-G formula based on SCr of 0.61  mg/dL). ------------------------------------------------------------------------------------------------------------------ No results for input(s): TSH, T4TOTAL, T3FREE, THYROIDAB in the last 72 hours.  Invalid input(s): FREET3   Coagulation profile Recent Labs  Lab 03/07/19 1609  INR 1.0   ------------------------------------------------------------------------------------------------------------------- No results for input(s): DDIMER in the last 72 hours. -------------------------------------------------------------------------------------------------------------------  Cardiac Enzymes Recent Labs  Lab 03/07/19 1609  TROPONINI <0.03   ------------------------------------------------------------------------------------------------------------------ Invalid input(s): POCBNP  ---------------------------------------------------------------------------------------------------------------  Urinalysis    Component Value Date/Time   COLORURINE STRAW (A) 03/07/2019 1609   APPEARANCEUR CLEAR (A) 03/07/2019 1609   APPEARANCEUR Cloudy (A) 06/23/2018 0000   LABSPEC 1.012 03/07/2019 1609   LABSPEC 1.015 04/21/2015 2041   PHURINE 7.0 03/07/2019  1609   GLUCOSEU 50 (A) 03/07/2019 1609   GLUCOSEU Negative 04/21/2015 2041   HGBUR SMALL (A) 03/07/2019 1609   BILIRUBINUR NEGATIVE 03/07/2019 1609   BILIRUBINUR Negative 06/23/2018 0000   BILIRUBINUR Negative 04/21/2015 2041   KETONESUR NEGATIVE 03/07/2019 1609   PROTEINUR NEGATIVE 03/07/2019 1609   NITRITE NEGATIVE 03/07/2019 1609   LEUKOCYTESUR SMALL (A) 03/07/2019 1609   LEUKOCYTESUR Negative 04/21/2015 2041     RADIOLOGY: Ct Angio Head W Or Wo Contrast  Result Date: 03/07/2019 CLINICAL DATA:  53 year old female with weakness after seizure. Facial droop. EXAM: CT ANGIOGRAPHY HEAD AND NECK CT PERFUSION BRAIN TECHNIQUE: Multidetector CT imaging of the head and neck was performed using the standard protocol during bolus administration  of intravenous contrast. Multiplanar CT image reconstructions and MIPs were obtained to evaluate the vascular anatomy. Carotid stenosis measurements (when applicable) are obtained utilizing NASCET criteria, using the distal internal carotid diameter as the denominator. Multiphase CT imaging of the brain was performed following IV bolus contrast injection. Subsequent parametric perfusion maps were calculated using RAPID software. CONTRAST:  OMNIPAQUE IOHEXOL 350 MG/ML SOLN COMPARISON:  Head CT without contrast 1630 hours today. CTA chest abdomen and pelvis earlier today. FINDINGS: CT Brain Perfusion Findings: CBF (<30%) Volume: None. Perfusion (Tmax>6.0s) volume: None. Mismatch Volume: Not applicable Infarction Location:Not applicable CTA NECK Skeleton: No acute osseous abnormality identified. Lower cervical spine disc and endplate degeneration. Upper chest: Mild dependent atelectasis. Other neck: Mild multinodular thyroid goiter. Aortic arch: Mildly bovine arch configuration. No arch atherosclerosis. Right carotid system: Mildly tortuous brachiocephalic and proximal right CCA. Negative right carotid bifurcation. Mildly tortuous cervical right ICA. Left carotid system: Negative left CCA and left carotid bifurcation. Mildly tortuous cervical left ICA. Vertebral arteries: Proximal right subclavian artery and right vertebral artery origins appear normal. The right vertebral artery is patent to the skull base with no stenosis identified. Proximal left subclavian artery and left vertebral artery origins appear normal. The left vertebral artery is patent to the skull base with no stenosis identified. CTA HEAD Posterior circulation: Codominant and normal distal vertebral arteries. Patent PICA origins and vertebrobasilar junction. Patent basilar artery without stenosis. Normal SCA and left PCA origins. Fetal type right PCA origin. Bilateral PCA branches are within normal limits. Anterior circulation: Both ICA siphons  are patent. On the left there is no plaque or stenosis. Normal left ophthalmic artery origin. On the right there is no plaque or stenosis. Normal right ophthalmic and posterior communicating artery origins. Patent carotid termini. Normal MCA and ACA origins. Diminutive or absent anterior communicating artery. Bilateral ACA branches are within normal limits. Left MCA M1 segment and bifurcation are patent without stenosis. Left MCA branches are within normal limits. Right MCA M1 segment and bifurcation are patent without stenosis. Right MCA branches are within normal limits. Venous sinuses: Patent. Anatomic variants: Mildly bovine arch configuration. Fetal type right PCA origin. Delayed phase: No abnormal enhancement identified. Stable gray-white matter differentiation throughout the brain. Review of the MIP images confirms the above findings IMPRESSION: 1. Negative for large vessel occlusion, and CT Perfusion detects no core infarct or ischemic penumbra. 2. No atherosclerosis or arterial stenosis identified in the head or neck. 3. Stable and negative CT appearance of the brain since 1630 hours today. 4. Mild thyroid goiter. Electronically Signed   By: Odessa Fleming M.D.   On: 03/07/2019 18:50   Ct Head Wo Contrast  Result Date: 03/07/2019 CLINICAL DATA:  Altered mental status/unresponsive. Questionable facial droop EXAM: CT HEAD WITHOUT CONTRAST  TECHNIQUE: Contiguous axial images were obtained from the base of the skull through the vertex without intravenous contrast. COMPARISON:  October 07, 2017 FINDINGS: Brain: The ventricles are not in size and configuration. There is no intracranial mass, hemorrhage, extra-axial fluid collection, or midline shift. The brain parenchyma appears unremarkable. No acute infarct evident. Vascular: There is no appreciable hyperdense vessel. There is no appreciable vascular calcification. Skull: Bony calvarium appears intact. Sinuses/Orbits: There is mucosal thickening in several ethmoid  air cells as well as in portions of each sphenoid sinus. Visualized orbits appear symmetric bilaterally. Other: Visualized mastoid air cells are clear. IMPRESSION: Brain parenchyma appears unremarkable. No mass or hemorrhage. Foci of paranasal sinus disease noted. Electronically Signed   By: Bretta Bang III M.D.   On: 03/07/2019 16:53   Ct Angio Neck W And/or Wo Contrast  Result Date: 03/07/2019 CLINICAL DATA:  52 year old female with weakness after seizure. Facial droop. EXAM: CT ANGIOGRAPHY HEAD AND NECK CT PERFUSION BRAIN TECHNIQUE: Multidetector CT imaging of the head and neck was performed using the standard protocol during bolus administration of intravenous contrast. Multiplanar CT image reconstructions and MIPs were obtained to evaluate the vascular anatomy. Carotid stenosis measurements (when applicable) are obtained utilizing NASCET criteria, using the distal internal carotid diameter as the denominator. Multiphase CT imaging of the brain was performed following IV bolus contrast injection. Subsequent parametric perfusion maps were calculated using RAPID software. CONTRAST:  OMNIPAQUE IOHEXOL 350 MG/ML SOLN COMPARISON:  Head CT without contrast 1630 hours today. CTA chest abdomen and pelvis earlier today. FINDINGS: CT Brain Perfusion Findings: CBF (<30%) Volume: None. Perfusion (Tmax>6.0s) volume: None. Mismatch Volume: Not applicable Infarction Location:Not applicable CTA NECK Skeleton: No acute osseous abnormality identified. Lower cervical spine disc and endplate degeneration. Upper chest: Mild dependent atelectasis. Other neck: Mild multinodular thyroid goiter. Aortic arch: Mildly bovine arch configuration. No arch atherosclerosis. Right carotid system: Mildly tortuous brachiocephalic and proximal right CCA. Negative right carotid bifurcation. Mildly tortuous cervical right ICA. Left carotid system: Negative left CCA and left carotid bifurcation. Mildly tortuous cervical left ICA.  Vertebral arteries: Proximal right subclavian artery and right vertebral artery origins appear normal. The right vertebral artery is patent to the skull base with no stenosis identified. Proximal left subclavian artery and left vertebral artery origins appear normal. The left vertebral artery is patent to the skull base with no stenosis identified. CTA HEAD Posterior circulation: Codominant and normal distal vertebral arteries. Patent PICA origins and vertebrobasilar junction. Patent basilar artery without stenosis. Normal SCA and left PCA origins. Fetal type right PCA origin. Bilateral PCA branches are within normal limits. Anterior circulation: Both ICA siphons are patent. On the left there is no plaque or stenosis. Normal left ophthalmic artery origin. On the right there is no plaque or stenosis. Normal right ophthalmic and posterior communicating artery origins. Patent carotid termini. Normal MCA and ACA origins. Diminutive or absent anterior communicating artery. Bilateral ACA branches are within normal limits. Left MCA M1 segment and bifurcation are patent without stenosis. Left MCA branches are within normal limits. Right MCA M1 segment and bifurcation are patent without stenosis. Right MCA branches are within normal limits. Venous sinuses: Patent. Anatomic variants: Mildly bovine arch configuration. Fetal type right PCA origin. Delayed phase: No abnormal enhancement identified. Stable gray-white matter differentiation throughout the brain. Review of the MIP images confirms the above findings IMPRESSION: 1. Negative for large vessel occlusion, and CT Perfusion detects no core infarct or ischemic penumbra. 2. No atherosclerosis or arterial stenosis identified  in the head or neck. 3. Stable and negative CT appearance of the brain since 1630 hours today. 4. Mild thyroid goiter. Electronically Signed   By: Odessa FlemingH  Hall M.D.   On: 03/07/2019 18:50   Ct Cerebral Perfusion W Contrast  Result Date: 03/07/2019 CLINICAL  DATA:  53 year old female with weakness after seizure. Facial droop. EXAM: CT ANGIOGRAPHY HEAD AND NECK CT PERFUSION BRAIN TECHNIQUE: Multidetector CT imaging of the head and neck was performed using the standard protocol during bolus administration of intravenous contrast. Multiplanar CT image reconstructions and MIPs were obtained to evaluate the vascular anatomy. Carotid stenosis measurements (when applicable) are obtained utilizing NASCET criteria, using the distal internal carotid diameter as the denominator. Multiphase CT imaging of the brain was performed following IV bolus contrast injection. Subsequent parametric perfusion maps were calculated using RAPID software. CONTRAST:  100mL OMNIPAQUE IOHEXOL 350 MG/ML SOLN COMPARISON:  Head CT without contrast 1630 hours today. CTA chest abdomen and pelvis earlier today. FINDINGS: CT Brain Perfusion Findings: CBF (<30%) Volume: None. Perfusion (Tmax>6.0s) volume: None. Mismatch Volume: Not applicable Infarction Location:Not applicable CTA NECK Skeleton: No acute osseous abnormality identified. Lower cervical spine disc and endplate degeneration. Upper chest: Mild dependent atelectasis. Other neck: Mild multinodular thyroid goiter. Aortic arch: Mildly bovine arch configuration. No arch atherosclerosis. Right carotid system: Mildly tortuous brachiocephalic and proximal right CCA. Negative right carotid bifurcation. Mildly tortuous cervical right ICA. Left carotid system: Negative left CCA and left carotid bifurcation. Mildly tortuous cervical left ICA. Vertebral arteries: Proximal right subclavian artery and right vertebral artery origins appear normal. The right vertebral artery is patent to the skull base with no stenosis identified. Proximal left subclavian artery and left vertebral artery origins appear normal. The left vertebral artery is patent to the skull base with no stenosis identified. CTA HEAD Posterior circulation: Codominant and normal distal vertebral  arteries. Patent PICA origins and vertebrobasilar junction. Patent basilar artery without stenosis. Normal SCA and left PCA origins. Fetal type right PCA origin. Bilateral PCA branches are within normal limits. Anterior circulation: Both ICA siphons are patent. On the left there is no plaque or stenosis. Normal left ophthalmic artery origin. On the right there is no plaque or stenosis. Normal right ophthalmic and posterior communicating artery origins. Patent carotid termini. Normal MCA and ACA origins. Diminutive or absent anterior communicating artery. Bilateral ACA branches are within normal limits. Left MCA M1 segment and bifurcation are patent without stenosis. Left MCA branches are within normal limits. Right MCA M1 segment and bifurcation are patent without stenosis. Right MCA branches are within normal limits. Venous sinuses: Patent. Anatomic variants: Mildly bovine arch configuration. Fetal type right PCA origin. Delayed phase: No abnormal enhancement identified. Stable gray-white matter differentiation throughout the brain. Review of the MIP images confirms the above findings IMPRESSION: 1. Negative for large vessel occlusion, and CT Perfusion detects no core infarct or ischemic penumbra. 2. No atherosclerosis or arterial stenosis identified in the head or neck. 3. Stable and negative CT appearance of the brain since 1630 hours today. 4. Mild thyroid goiter. Electronically Signed   By: Odessa FlemingH  Hall M.D.   On: 03/07/2019 18:50   Ct Angio Chest/abd/pel For Dissection W And/or Wo Contrast  Result Date: 03/07/2019 CLINICAL DATA:  Chest pain.  Unresponsive. EXAM: CT ANGIOGRAPHY CHEST, ABDOMEN AND PELVIS TECHNIQUE: Multidetector CT imaging through the chest, abdomen and pelvis was performed using the standard protocol during bolus administration of intravenous contrast. Multiplanar reconstructed images and MIPs were obtained and reviewed to evaluate the  vascular anatomy. CONTRAST:  ISOVUE-370 IOPAMIDOL  (ISOVUE-370) INJECTION 76% COMPARISON:  CT abdomen and pelvis 11/13/2018 FINDINGS: CTA CHEST FINDINGS Cardiovascular: Heart is normal size. Aorta is normal caliber. No dissection. Mediastinum/Nodes: No mediastinal, hilar, or axillary adenopathy. Lungs/Pleura: Dependent atelectasis in the lungs.  No effusions. Musculoskeletal: No acute bony abnormality. Review of the MIP images confirms the above findings. CTA ABDOMEN AND PELVIS FINDINGS VASCULAR Aorta: Normal caliber.  No aneurysm or dissection. Celiac: Widely patent SMA: Widely patent Renals: Single bilaterally, widely patent IMA: Widely patent Inflow: Patent.  No aneurysm or dissection. Veins: Grossly patent. Review of the MIP images confirms the above findings. NON-VASCULAR Hepatobiliary: Insert paddle biliary Pancreas: No focal abnormality or ductal dilatation. Spleen: No focal abnormality.  Normal size. Adrenals/Urinary Tract: No adrenal abnormality. No focal renal abnormality. No stones or hydronephrosis. Urinary bladder is unremarkable. Stomach/Bowel: Normal appendix. Stomach, large and small bowel grossly unremarkable. Lymphatic: Shotty retroperitoneal lymph nodes.  No adenopathy. Reproductive: Prior hysterectomy.  No adnexal masses. Other: No free fluid or free air. Musculoskeletal: No acute bony abnormality. Prior left hip replacement. Degenerative changes in the lumbar spine. Review of the MIP images confirms the above findings. IMPRESSION: No evidence of aortic aneurysm or dissection. Bibasilar atelectasis.  No acute cardiopulmonary disease. No acute findings in the abdomen or pelvis. Electronically Signed   By: Charlett Nose M.D.   On: 03/07/2019 16:56    EKG: Orders placed or performed during the hospital encounter of 03/07/19  . EKG 12-Lead  . EKG 12-Lead  . ED EKG  . ED EKG    IMPRESSION AND PLAN: *Acute left arm/leg weakness with altered mental status Exact etiology is unknown -possible CVA versus seizure with Todd's paralysis Admit to  regular nursing for bed on our CVAs versus TIA protocol, aspirin, statin therapy, check lipids in the morning, neurochecks per routine, check MRI of the brain, echocardiogram, carotid Dopplers, PT/OT/speech therapy to evaluate/treat, increase nursing care PRN, head of the bed at 30 degrees, treat hypoglycemia, seizure precautions, avoid unnecessary sedating agents, and continue close medical monitoring  *Acute possible recurrent seizure Note patient was taken off antiepileptic drugs 6 months ago by neurology/Dr. Sherryll Burger as they were thought to be due to pseudoseizures Continue Keppra twice daily, neurology input appreciated, check EEG for further evaluation, seizure precautions, Ativan as needed  *Acute hypoglycemia Continue D10, hypoglycemic protocol, Accu-Cheks every 2 hours for now  *Chronic hypoxic respiratory failure Secondary to COPD, sarcoid Stable Continue 2 L via nasal cannula continuous which is her home dose  *Chronic obstructive sleep apnea Stable CPAP at bedtime/as needed  *Chronic bipolar illness Continue home psychotropic regiment  *Chronic morbid obesity Most likely secondary to excess calories Lifestyle modification recommended    All the records are reviewed and case discussed with ED provider. Management plans discussed with the patient, family and they are in agreement.  CODE STATUS:full Code Status History    Date Active Date Inactive Code Status Order ID Comments User Context   12/02/2018 1324 12/03/2018 1618 Full Code [id number]  Rudean Hitt, PA-C Inpatient       TOTAL TIME TAKING CARE OF THIS PATIENT: 40 minutes.    Evelena Asa Salary M.D on 03/07/2019   Between 7am to 6pm - Pager - [phone]  After 6pm go to [email or web address] - password [email or web address]  [facility]  Office  [phone]  CC: Primary care physician; Carlean Jews, NP   Note: This dictation was prepared with Dragon dictation along with smaller  Sport and exercise psychologist. Any transcriptional errors that result from this process are unintentional.

## 2019-03-07 NOTE — ED Notes (Signed)
Neurologist on screem

## 2019-03-07 NOTE — ED Provider Notes (Addendum)
[facility] Emergency Department Provider Note  ____________________________________________   I have reviewed the triage vital signs and the nursing notes. Where available I have reviewed prior notes and, if possible and indicated, outside hospital notes.    HISTORY  Chief Complaint Altered Mental Status and Chest Pain    HPI Nicole Wyatt is a 53 y.o. female who has a history of bipolar disorder COPD, seizure disorder, last seizure was about a year ago, no longer on medications, by design, history of COPD, on home oxygen, presented apparently called EMS with chest pain on the left side which was a tingling.  Patient herself cannot give me this history, she is altered at this time.  Called to the ambulance bay for this patient has just before they arrived she became somewhat altered.  She had twitching that looked like a seizure and then was confused.  Patient had a diffuse nonfocal weakness for the EMS providers.  They asked me if she should be transported immediately to a stroke center, obviously, this would be illegal unfortunately given the EMTALA will, I do feel the patient would be well served with immediate care here.  Her vital signs were normal before the event, and her sugar was 111 per EMS prior to the event. She states her pain is gone in her chest, level 5 chart caveat; no further history available due to patient status.  Past Medical History:  Diagnosis Date  . Abnormal Pap smear of cervix   . Anxiety   . Arthritis   . Bipolar 1 disorder (HCC)   . COPD (chronic obstructive pulmonary disease) (HCC)   . Depression   . Dyspnea    with exertion   . Kidney stone   . Oxygen dependent    patient uses 2L oz PRN ; reports hardly ever uses it unless very SOB    . Rocky Mountain spotted fever 2018  . Sarcoidosis of lung (HCC)    managed by Dr Lindie Spruce   . Seizures (HCC) last seizure 05/2013  . Sleep apnea    uses CPAP nightly   . UTI (urinary tract  infection) 11/13/2018   see ED visit in epic ; was dc'd with oral abx and reports completed and relief of sx     Patient Active Problem List   Diagnosis Date Noted  . Influenza A 02/24/2019  . Acute upper respiratory infection 02/24/2019  . Chronic obstructive pulmonary disease (HCC) 02/24/2019  . Acute otitis externa of both ears 01/06/2019  . Mild intermittent asthma without complication 01/06/2019  . Primary osteoarthritis of hip 12/02/2018  . 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 OSA (obstructive sleep apnea) 10/13/2018  . 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 Rocky Mountain spotted fever 07/02/2018  . Urinary tract infection without hematuria 07/02/2018  . Dysuria 06/23/2018  . Depression, major, recurrent, moderate (HCC) 03/26/2018  . Generalized edema 03/26/2018  . Anaphylactic syndrome 03/26/2018  . Cutaneous candidiasis 03/26/2018  . Bursitis of hip 02/03/2018  . Osteoarthritis of knee 02/03/2018  . Chronic pulmonary hypertension (HCC) 06/18/2016  . S/P cardiac cath 06/18/2016  . Unstable angina (HCC) 05/24/2016  . Bilateral leg edema 05/22/2016  . Shortness of breath 04/30/2016  . Skin lesion of right arm 03/01/2016  . Screening for breast cancer 06/10/2015  . Skin lesion of breast 06/10/2015  . Hx of seizure disorder 06/24/2014  . Left-sided weakness 06/24/2014  . Tobacco abuse 06/24/2014    Past Surgical History:  Procedure Laterality Date  .  ABDOMINAL HYSTERECTOMY  1999  . BLADDER SURGERY    . CARDIAC CATHETERIZATION Bilateral 05/29/2016   Procedure: Right/Left Heart Cath and Coronary Angiography;  Surgeon: Lamar Blinks, MD;  Location: [facility] INVASIVE CV LAB;  Service: Cardiovascular;  Laterality: Bilateral;  . COLONOSCOPY    . COLONOSCOPY N/A 07/19/2015   Procedure: COLONOSCOPY;  Surgeon: Earline Mayotte, MD;  Location: [facility] ENDOSCOPY;  Service: Endoscopy;  Laterality: N/A;  . FLEXIBLE BRONCHOSCOPY N/A 07/27/2016   Procedure: FLEXIBLE BRONCHOSCOPY;   Surgeon: Yevonne Pax, MD;  Location: [facility] ORS;  Service: Pulmonary;  Laterality: N/A;  . FOOT SURGERY    . KIDNEY STONE SURGERY  7 years  . TOTAL HIP ARTHROPLASTY Left 12/02/2018   Procedure: TOTAL HIP ARTHROPLASTY ANTERIOR APPROACH;  Surgeon: Sheral Apley, MD;  Location: [facility] ORS;  Service: Orthopedics;  Laterality: Left;  . TUBAL LIGATION      Prior to Admission medications   Medication Sig Start Date End Date Taking? Authorizing Provider  albuterol (PROVENTIL HFA;VENTOLIN HFA) 108 (90 Base) MCG/ACT inhaler Inhale 2 puffs into the lungs every 4 (four) hours as needed for wheezing or shortness of breath. 01/06/19   Carlean Jews, NP  albuterol (PROVENTIL) (2.5 MG/3ML) 0.083% nebulizer solution Take 3 mLs (2.5 mg total) by nebulization every 6 (six) hours as needed for wheezing or shortness of breath. 03/25/18   Carlean Jews, NP  aspirin EC 81 MG tablet Take 1 tablet (81 mg total) by mouth 2 (two) times daily. For DVT prophylaxis for 30 days after surgery. 12/02/18   Albina Billet III, PA-C  azithromycin (ZITHROMAX) 250 MG tablet z-pack - take as directed for 5 days 02/24/19   Carlean Jews, NP  docusate sodium (COLACE) 100 MG capsule Take 1 capsule (100 mg total) by mouth 2 (two) times daily. To prevent constipation while taking pain medication. 12/02/18   Albina Billet III, PA-C  famotidine (PEPCID) 20 MG tablet Take 1 tablet (20 mg total) by mouth 2 (two) times daily. For Gastroprotection 12/02/18 01/01/19  Martensen, Lucretia Kern III, PA-C  fluticasone (FLONASE) 50 MCG/ACT nasal spray Place 2 sprays into both nostrils daily. 02/16/19   Fisher, Roselyn Bering, PA-C  furosemide (LASIX) 40 MG tablet Take 1 tablet (40 mg total) by mouth daily as needed for fluid. 01/06/19   Carlean Jews, NP  gabapentin (NEURONTIN) 300 MG capsule Take 1 capsule (300 mg total) by mouth 3 (three) times daily for 14 days. For 2 weeks post op for pain. 12/02/18 12/16/18  Albina Billet III, PA-C  methocarbamol (ROBAXIN) 500 MG tablet Take 1 tablet (500 mg total) by mouth every 8 (eight) hours as needed for muscle spasms. 12/02/18   Martensen, Lucretia Kern III, PA-C  mometasone [facility]) 220 MCG/INH inhaler Inhale 2 puffs into the lungs daily as needed (for shortness of breath or wheezing).     [provider]  mometasone-formoterol (DULERA) 100-5 MCG/ACT AERO Inhale 2 puffs into the lungs daily as needed for wheezing or shortness of breath.    [provider]  neomycin-polymyxin-hydrocortisone (CORTISPORIN) 3.5-10000-1 OTIC suspension Place 4 drops into both ears 2 (two) times daily. 01/06/19   Carlean Jews, NP  ondansetron (ZOFRAN) 4 MG tablet Take 1 tablet (4 mg total) by mouth every 8 (eight) hours as needed for nausea or vomiting. 12/02/18   Albina Billet III, PA-C  oseltamivir (TAMIFLU) 75 MG capsule Take 1 capsule (75 mg total) by mouth 2 (two) times daily.  02/24/19   Carlean Jews, NP  OXYGEN Inhale 2 L into the lungs continuous.     [provider]  predniSONE (STERAPRED UNI-PAK 21 TAB) 10 MG (21) TBPK tablet 6 day taper - take by mouth as directed for 6 days 02/24/19   Carlean Jews, NP  sertraline (ZOLOFT) 100 MG tablet Take 2 tablets (200 mg total) by mouth daily. 01/06/19   Carlean Jews, NP  tamsulosin (FLOMAX) 0.4 MG CAPS capsule Take 1 capsule (0.4 mg total) by mouth daily. 11/13/18   Minna Antis, MD  venlafaxine XR (EFFEXOR-XR) 75 MG 24 hr capsule Take 1 capsule (75 mg total) by mouth daily with breakfast. 01/06/19   Carlean Jews, NP    Allergies Bee venom; Ciprofloxacin; and Penicillins  Family History  Problem Relation Age of Onset  . Hypertension Mother   . Arthritis Mother   . Hypertension Father   . Arthritis Father   . Prostate cancer Father   . Ovarian cancer Maternal Grandmother     Social History Social History   Tobacco Use  . Smoking status: Current Every Day Smoker     Packs/day: 0.50    Years: 14.00    Pack years: 7.00    Types: Cigarettes    Last attempt to quit: 05/07/2016    Years since quitting: 2.8  . Smokeless tobacco: Never Used  . Tobacco comment: 11-25-18 reports  down to just 1 cigarette per day   Substance Use Topics  . Alcohol use: Yes    Alcohol/week: 0.0 standard drinks    Comment: seldom   . Drug use: No    Review of Systems Constitutional: No fever/chills Eyes: No visual changes. ENT: No sore throat. No stiff neck no neck pain Cardiovascular: Denies chest pain. Respiratory: Denies shortness of breath. Gastrointestinal:   no vomiting.  No diarrhea.  No constipation. Genitourinary: Negative for dysuria. Musculoskeletal: Negative lower extremity swelling Skin: Negative for rash. Neurological: Negative for severe headaches, focal weakness or numbness.   ____________________________________________   PHYSICAL EXAM:  VITAL SIGNS: ED Triage Vitals  Enc Vitals Group     BP 03/07/19 1609 (!) 145/81     Pulse Rate 03/07/19 1609 66     Resp 03/07/19 1609 (!) 22     Temp 03/07/19 1609 98.5 F (36.9 C)     Temp Source 03/07/19 1609 Axillary     SpO2 03/07/19 1609 (!) 87 %     Weight 03/07/19 1610 245 lb (111.1 kg)     Height 03/07/19 1610  (1.702 m)     Head Circumference --      Peak Flow --      Pain Score --      Pain Loc --      Pain Edu? --      Excl. in GC? --     Constitutional: Somnolent, looking around will answer questions eyes are open, delayed answering, some limitations in neurologic exam as a result.  She is guarding her airway.  Peers confused or postictal. Eyes: Conjunctivae are normal Head: Atraumatic HEENT: No congestion/rhinnorhea. Mucous membranes are moist.  Oropharynx non-erythematous Neck:   Nontender with no meningismus, no masses, no stridor Cardiovascular: Normal rate, regular rhythm. Grossly normal heart sounds.  Good peripheral circulation. Respiratory: Normal respiratory effort.  No  retractions. Lungs CTAB. Abdominal: Soft and nontender. No distention. No guarding no rebound Back:  There is no focal tenderness or step off.  there is no midline tenderness there  are no lesions noted. there is no CVA tenderness Musculoskeletal: No lower extremity tenderness, no upper extremity tenderness. No joint effusions, no DVT signs strong distal pulses no edema Neurologic: Speech appears normal, seem to have some degree of facial droop on the right, she has equal grip strength which is very weak and she is unable to lift either leg off the bed.   Skin:  Skin is warm, dry and intact. No rash noted. Psychiatric: Mood and affect are normal. Speech and behavior are normal.  ____________________________________________   LABS (all labs ordered are listed, but only abnormal results are displayed)  Labs Reviewed  PROTIME-INR  APTT  TROPONIN I  COMPREHENSIVE METABOLIC PANEL  ETHANOL  URINALYSIS, COMPLETE (UACMP) WITH MICROSCOPIC  URINE DRUG SCREEN, QUALITATIVE ([facility] ONLY)  CBG MONITORING, ED    Pertinent labs  results that were available during my care of the patient were reviewed by me and considered in my medical decision making (see chart for details). ____________________________________________  EKG  I personally interpreted any EKGs ordered by me or triage 2 EKGs were performed on this patient, the first was at 4:09, showed sinus rhythm, long PR interval, RBB LAFB and associated ST change but no ST elevation, repeat EKG showed rate 64 sinus rhythm, LAFB, RBBB, and again inferior lead ST changes,  There is no significant change from old EKG October 2019 of also reviewed ____________________________________________  RADIOLOGY  Pertinent labs & imaging results that were available during my care of the patient were reviewed by me and considered in my medical decision making (see chart for details). If possible, patient and/or family made aware of any abnormal findings.  No  results found. ____________________________________________    PROCEDURES  Procedure(s) performed: None  Procedures  Critical Care performed: CRITICAL CARE Performed by: Jeanmarie PlantJAMES A Kismet Facemire   Total critical care time: 45 minutes  Critical care time was exclusive of separately billable procedures and treating other patients.  Critical care was necessary to treat or prevent imminent or life-threatening deterioration.  Critical care was time spent personally by me on the following activities: development of treatment plan with patient and/or surrogate as well as nursing, discussions with consultants, evaluation of patient's response to treatment, examination of patient, obtaining history from patient or surrogate, ordering and performing treatments and interventions, ordering and review of laboratory studies, ordering and review of radiographic studies, pulse oximetry and re-evaluation of patient's condition.   ____________________________________________   INITIAL IMPRESSION / ASSESSMENT AND PLAN / ED COURSE  Pertinent labs & imaging results that were available during my care of the patient were reviewed by me and considered in my medical decision making (see chart for details).  Patient was on the way in from EMS from a "routine chest pain" for EMS and then had what appeared to be a seizure, now does appear to be's postictal.  Possibly there is some element of Todd's paralysis, she has some slight weakness in her face although it also seems to be somewhat effort related.  She is waking up gradually here.  I have ordered a load of Keppra.  Given chest pain followed by neurologic findings, I will order a CT dissection protocol as well as a CT head, patient's creatinine is known to be normal from last visit I do not think it is in her best interest to wait for blood work which can take over an hour.  In addition, we will load her with Keppra.  Seizure precautions have been instituted.  Blood  sugar  was in the 16W which certainly could incite a seizure, and we have given her an amp of D50 and will recheck that.  She seems to be waking up.  We will continue to monitor her here.  Given witnessed seizure activity or EMS,, even if this is a CVA, I do not think she likely will be a candidate for TPA.  ----------------------------------------- 5:39 PM on 03/07/2019 ----------------------------------------      ----------------------------------------- 4:38 PM on 03/07/2019 -----------------------------------------  Patient in CT at this time, on exam, she was found to have been incontinent of bladder which again is all consistent with likely seizure activity. _________________________________  ----------------------------------------- 5:23 PM on 03/07/2019 -----------------------------------------  Sugars 244 after D50, those readings of 10 and 12 are not accurate, immediately afterwards we checked it from the IV and was 244.  They are having trouble with results from the extremities apparently it appears to have been some sort of technical issue.  She is awake and more alert at this time, she has a right facial droop and left upper extremity weakness which is certainly not physiologic and after seizure, which was witnessed, she is not a candidate for TPA there is most likely in about Todd's paralysis.  However I have paged neurology.  Patient is in no acute distress otherwise, CT head and chest/abdomen are reassuring.  Cardiac enzymes are reassuring.  ----------------------------------------- 5:39 PM on 03/07/2019 -----------------------------------------  Other discussed with on-call neurology he is not at this facility, Dr. Otelia Limes.  Appreciate consult.  Certainly on the differential is Todd's paralysis, he did not recommend emergent TPA on this patient.  He states however that he is not at the bedside and cannot make definitive recommendations.  I personally feel this is more likely  to be atypical Todd's paralysis with a witnessed seizure incontinence of bladder and postictal period.  We are seeing if we can get a tele-neurologist to further evaluate her.  Dr. Otelia Limes did suggested CTA of the head and neck.  I did point out she already had a dye load.  He states that this is something he cannot comment upon, in terms of safety.  Patient's creatinine is 0.6 I will give her IV fluid I have ordered CTA head and neck but we will see what tele-neurology says.   ----------------------------------------- 6:18 PM on 03/07/2019 -----------------------------------------  Tele-neurologist not recommend TPA at this time he wants a CT CTA and perfusion study, we have ordered this, we are also giving her IV fluid, obviously this is a dilated, but at the same time there still is question about whether she is having acute stroke and she is in the window and neurology feels that this is indicated.  We will see what those results show.  ----------------------------------------- 7:17 PM on 03/07/2019 -----------------------------------------  CTs are negative, no indication of large stroke, patient will need an MRI, her blood sugar does continue to go down, went from 2 44-76, we will give her another amp of D50 and started on a D10 drip.  We have asked her if she is accidentally or intentionally taking other peoples insulin or diabetic medication as she does not take any her own and she states now.  Unclear why this is happening.  Low suspicion for insulinoma although certainly possible early.  We will continue to watch her closely here.  She does not appear to be septic, and this very atypical presentation is nothing concerning enough to merit admission ___________   FINAL CLINICAL IMPRESSION(S) / ED DIAGNOSES  Final  diagnoses:  None      This chart was dictated using voice recognition software.  Despite best efforts to proofread,  errors can occur which can change meaning.   3     Jeanmarie Plant, MD 03/07/19 1623    Jeanmarie Plant, MD 03/07/19 1638    Jeanmarie Plant, MD 03/07/19 1724    Jeanmarie Plant, MD 03/07/19 1741    Jeanmarie Plant, MD 03/07/19 1818    Jeanmarie Plant, MD 03/07/19 1918

## 2019-03-07 NOTE — ED Notes (Signed)
Code stroke activated ?

## 2019-03-08 ENCOUNTER — Inpatient Hospital Stay: Payer: Medicare Other

## 2019-03-08 ENCOUNTER — Other Ambulatory Visit: Payer: Self-pay

## 2019-03-08 ENCOUNTER — Inpatient Hospital Stay (HOSPITAL_COMMUNITY)
Admit: 2019-03-08 | Discharge: 2019-03-08 | Disposition: A | Discharge status: Home / Self Care | Payer: Medicare Other | Attending: Family Medicine | Admitting: Family Medicine

## 2019-03-08 DIAGNOSIS — R9431 Abnormal electrocardiogram [ECG] [EKG]: Secondary | ICD-10-CM

## 2019-03-08 DIAGNOSIS — R29898 Other symptoms and signs involving the musculoskeletal system: Secondary | ICD-10-CM

## 2019-03-08 DIAGNOSIS — R569 Unspecified convulsions: Secondary | ICD-10-CM

## 2019-03-08 LAB — GLUCOSE, CAPILLARY
GLUCOSE-CAPILLARY: 114 mg/dL — AB (ref 70–99)
Glucose-Capillary: 110 mg/dL — ABNORMAL HIGH (ref 70–99)
Glucose-Capillary: 111 mg/dL — ABNORMAL HIGH (ref 70–99)
Glucose-Capillary: 75 mg/dL (ref 70–99)
Glucose-Capillary: 122 mg/dL — ABNORMAL HIGH (ref 70–99)
Glucose-Capillary: 97 mg/dL (ref 70–99)

## 2019-03-08 LAB — CBC
HEMATOCRIT: 36.2 % (ref 36.0–46.0)
Hemoglobin: 11.7 g/dL — ABNORMAL LOW (ref 12.0–15.0)
MCH: 29.2 pg (ref 26.0–34.0)
MCHC: 32.3 g/dL (ref 30.0–36.0)
MCV: 90.3 fL (ref 80.0–100.0)
Platelets: 278 10*3/uL (ref 150–400)
RBC: 4.01 MIL/uL (ref 3.87–5.11)
RDW: 15.9 % — ABNORMAL HIGH (ref 11.5–15.5)
WBC: 5.9 10*3/uL (ref 4.0–10.5)
nRBC: 0 % (ref 0.0–0.2)

## 2019-03-08 LAB — BASIC METABOLIC PANEL
Anion gap: 7 (ref 5–15)
BUN: 9 mg/dL (ref 6–20)
CO2: 26 mmol/L (ref 22–32)
Calcium: 8.3 mg/dL — ABNORMAL LOW (ref 8.9–10.3)
Chloride: 106 mmol/L (ref 98–111)
Creatinine, Ser: 0.73 mg/dL (ref 0.44–1.00)
GFR calc Af Amer: >60 mL/min (ref >60)
GFR calc non Af Amer: >60 mL/min (ref >60)
Glucose, Bld: 111 mg/dL — ABNORMAL HIGH (ref 70–99)
POTASSIUM: 3.2 mmol/L — AB (ref 3.5–5.1)
Sodium: 139 mmol/L (ref 135–145)

## 2019-03-08 LAB — HEMOGLOBIN A1C
Hgb A1c MFr Bld: 4.9 % (ref 4.8–5.6)
Mean Plasma Glucose: 93.93 mg/dL

## 2019-03-08 LAB — LIPID PANEL
Cholesterol: 164 mg/dL (ref 0–200)
HDL: 36 mg/dL — AB (ref >40)
LDL Cholesterol: 101 mg/dL — ABNORMAL HIGH (ref 0–99)
TRIGLYCERIDES: 136 mg/dL (ref <150)
Total CHOL/HDL Ratio: 4.6 RATIO
VLDL: 27 mg/dL (ref 0–40)

## 2019-03-08 LAB — ECHOCARDIOGRAM COMPLETE
Height: 67 in
Weight: 3920 oz

## 2019-03-08 LAB — TROPONIN I
Troponin I: <0.03 ng/mL (ref <0.03)
Troponin I: <0.03 ng/mL (ref <0.03)

## 2019-03-08 MED ORDER — PERFLUTREN LIPID MICROSPHERE
1.0000 mL | INTRAVENOUS | Status: AC | PRN
  Administered 2019-03-08: 6 mL via INTRAVENOUS
  Filled 2019-03-08: qty 10

## 2019-03-08 MED ORDER — LACOSAMIDE 50 MG PO TABS
50.0000 mg | ORAL_TABLET | Freq: Two times a day (BID) | ORAL | Status: DC
  Administered 2019-03-08 – 2019-03-10 (×4): 50 mg via ORAL
  Filled 2019-03-08 (×4): qty 1

## 2019-03-08 MED ORDER — TRAMADOL HCL 50 MG PO TABS: 50.0000 mg | ORAL_TABLET | Freq: Four times a day (QID) | ORAL | Status: DC | PRN

## 2019-03-08 MED ORDER — ATORVASTATIN CALCIUM 20 MG PO TABS
40.0000 mg | ORAL_TABLET | Freq: Every day | ORAL | Status: DC
  Administered 2019-03-08 – 2019-03-10 (×3): 40 mg via ORAL
  Filled 2019-03-08 (×3): qty 2

## 2019-03-08 MED ORDER — ORAL CARE MOUTH RINSE
15.0000 mL | Freq: Two times a day (BID) | OROMUCOSAL | Status: DC
  Administered 2019-03-08 – 2019-03-10 (×4): 15 mL via OROMUCOSAL

## 2019-03-08 MED ORDER — PNEUMOCOCCAL VAC POLYVALENT 25 MCG/0.5ML IJ INJ
0.5000 mL | INJECTION | INTRAMUSCULAR | Status: AC
  Administered 2019-03-09: 13:00:00 0.5 mL via INTRAMUSCULAR
  Filled 2019-03-08: qty 0.5

## 2019-03-08 NOTE — Evaluation (Signed)
Physical Therapy Evaluation Patient Details Name: Nicole Wyatt MRN: [id number] DOB: [birth year] Today's Date: 03/08/2019   History of Present Illness  53 yo female with onset of L side weakness esp UE was admitted and had no findings on her brain MRI.  Had clear lung imaging, noted atelectasis. Had initially low BS, possible seizures, AMS.  Pt is home with sick family who cannot care for her.  PMHx:  goiter, sarcoidosis, morbid obesity, chronic respiratory failure, bipolar illness,   Clinical Impression  Pt is up to side of bed with minor help and then to chair with unsteady steps due to weakness on LLE with UE accompanying it.  Her plan is to ask for SNF stay, and pt is going to think about the amount of help she needs to safely manage at home.  Talked with PT about her parents' caregivers from Hospice not having the time or ability to assist her with mobility while they are there to assist her parents.  Follow acutely for L side strengthening and motor reintegration along with standing and sitting balance practice.    Follow Up Recommendations SNF    Equipment Recommendations  None recommended by PT    Recommendations for Other Services       Precautions / Restrictions Precautions Precautions: Fall(telemetry) Precaution Comments: L hemiparesis Restrictions Weight Bearing Restrictions: No      Mobility  Bed Mobility Overal bed mobility: Needs Assistance Bed Mobility: Supine to Sit     Supine to sit: Min assist     General bed mobility comments: min to assist getting to side of bed fully  Transfers Overall transfer level: Needs assistance Equipment used: Rolling walker (2 wheeled);1 person hand held assist Transfers: Sit to/from Stand Sit to Stand: Mod assist         General transfer comment: mod to power up and control initial balance effort  Ambulation/Gait Ambulation/Gait assistance: Mod assist Gait Distance (Feet): 5 Feet Assistive device: Rolling walker (2  wheeled);1 person hand held assist Gait Pattern/deviations: Step-to pattern;Decreased stride length;Wide base of support;Trunk flexed Gait velocity: reduced Gait velocity interpretation: <1.8 ft/sec, indicate of risk for recurrent falls General Gait Details: sidestepped to chair to L and used RW for RUE support, to unload LLE and slide to the chair, PT moved the walker  Stairs            Wheelchair Mobility    Modified Rankin (Stroke Patients Only) Modified Rankin (Stroke Patients Only) Pre-Morbid Rankin Score: No symptoms Modified Rankin: Moderately severe disability     Balance Overall balance assessment: Needs assistance Sitting-balance support: Feet supported;Bilateral upper extremity supported Sitting balance-Leahy Scale: Fair     Standing balance support: Bilateral upper extremity supported;During functional activity Standing balance-Leahy Scale: Poor                               Pertinent Vitals/Pain Pain Assessment: No/denies pain    Home Living Family/patient expects to be discharged to:: Private residence Living Arrangements: Children;Parent Available Help at Discharge: Family Type of Home: House Home Access: Ramped entrance     Home Layout: One level Home Equipment: Environmental consultant - 2 wheels;Gilmer Mor - single point Additional Comments: parents are sick and under hospice care    Prior Function Level of Independence: Independent         Comments: has been able to mobilize independently and no AD needed     Hand Dominance  Extremity/Trunk Assessment   Upper Extremity Assessment Upper Extremity Assessment: LUE deficits/detail LUE Deficits / Details: dense LUE weakness  LUE Coordination: decreased fine motor;decreased gross motor    Lower Extremity Assessment Lower Extremity Assessment: LLE deficits/detail LLE Deficits / Details: dense L side weakness, pt is assisting with RLE LLE Coordination: decreased fine motor;decreased gross  motor    Cervical / Trunk Assessment Cervical / Trunk Assessment: Normal  Communication   Communication: No difficulties  Cognition Arousal/Alertness: Awake/alert Behavior During Therapy: Flat affect Overall Cognitive Status: Within Functional Limits for tasks assessed                                 General Comments: pt is talking about going directly home which is not currently possible without signficiant help, and expects her parents Hospice caregivers will help her      General Comments General comments (skin integrity, edema, etc.): pt is working on control of standing at walker, and sidesteps to chair wiht RW using PT to hold gait belt and control LUE on walker    Exercises     Assessment/Plan    PT Assessment Patient needs continued PT services  PT Problem List Decreased strength;Decreased activity tolerance;Decreased range of motion;Decreased balance;Decreased mobility;Decreased coordination;Decreased knowledge of use of DME;Decreased safety awareness;Decreased cognition;Decreased knowledge of precautions;Obesity;Decreased skin integrity       PT Treatment Interventions DME instruction;Gait training;Functional mobility training;Therapeutic activities;Therapeutic exercise;Balance training;Neuromuscular re-education;Patient/family education    PT Goals (Current goals can be found in the Care Plan section)  Acute Rehab PT Goals Patient Stated Goal: to get directly home if possible PT Goal Formulation: With patient Time For Goal Achievement: 03/22/19 Potential to Achieve Goals: Good    Frequency Min 2X/week   Barriers to discharge Inaccessible home environment;Decreased caregiver support home with no family support for movement and with ramp to enter    Co-evaluation               AM-PAC PT "6 Clicks" Mobility  Outcome Measure Help needed turning from your back to your side while in a flat bed without using bedrails?: A Little Help needed  moving from lying on your back to sitting on the side of a flat bed without using bedrails?: A Lot Help needed moving to and from a bed to a chair (including a wheelchair)?: A Lot Help needed standing up from a chair using your arms (e.g., wheelchair or bedside chair)?: A Lot Help needed to walk in hospital room?: A Lot Help needed climbing 3-5 steps with a railing? : Total 6 Click Score: 12    End of Session Equipment Utilized During Treatment: Gait belt Activity Tolerance: Patient limited by fatigue;Treatment limited secondary to medical complications (Comment) Patient left: in chair;with call bell/phone within reach;with chair alarm set Nurse Communication: Mobility status PT Visit Diagnosis: Unsteadiness on feet (R26.81);Muscle weakness (generalized) (M62.81);Difficulty in walking, not elsewhere classified (R26.2);Hemiplegia and hemiparesis Hemiplegia - Right/Left: Left Hemiplegia - dominant/non-dominant: Non-dominant Hemiplegia - caused by: Unspecified    Time: 1410-1438 PT Time Calculation (min) (ACUTE ONLY): 28 min   Charges:   PT Evaluation $PT Eval Moderate Complexity: 1 Mod PT Treatments $Therapeutic Activity: 8-22 mins       Ivar Drape 03/08/2019, 4:01 PM  Samul Dada, PT MS Acute Rehab Dept. Number: [facility] [id number] and [facility] [phone]

## 2019-03-08 NOTE — Consult Note (Signed)
Referring Physician: Salary    Chief Complaint: Left sided weakness, seizure-like activity  HPI: Nicole Wyatt is an 53 y.o. female with a history of seizure-like events (not felt to be seizure based on previous work-ups) and left sided weakness who presented on yesterday with left sided chest pain.  EMS was called.  En route was felt to have seizure-like activity.  Patient brought to the ED where she was not particularly focal initially but later complained of left sided weakness and numbness.  Has had multiple episodes similar to this in the past.  Has had multiple admissions in the past with negative work ups.  Reports this episode was only different in that she felt more confused.  Was seen on an outpatient basis by neurology at [facility] and [facility].  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  Patient previously on Keppra.  Per last note of Dr. Sherryll Burger patient had stopped taking her Keppra that had been started at [facility] as of 2015.  Vimpat had been stopped in October.  Patient denies this and reports that Dr. Sherryll Burger discontinued her medications.  Do not have [facility] records for review.    Date last known well: Date: 03/07/2019 Time last known well: Time: 15:54 tPA Given: No: Not felt to be a stroke  Past Medical History:  Diagnosis Date  . Abnormal Pap smear of cervix   . Anxiety   . Arthritis   . Bipolar 1 disorder (HCC)   . COPD (chronic obstructive pulmonary disease) (HCC)   . Depression   . Dyspnea    with exertion   . Kidney stone   . Oxygen dependent    patient uses 2L oz PRN ; reports hardly ever uses it unless very SOB    . [place] spotted fever 2018  . Sarcoidosis of lung (HCC)    managed by Dr Lindie Spruce   . Seizures (HCC) last seizure 05/2013  . Sleep apnea    uses CPAP nightly   . UTI (urinary tract infection) 11/13/2018   see ED visit in epic ; was dc'd with oral abx and reports completed and relief of sx     Past Surgical History:  Procedure Laterality  Date  . ABDOMINAL HYSTERECTOMY  1999  . BLADDER SURGERY    . CARDIAC CATHETERIZATION Bilateral 05/29/2016   Procedure: Right/Left Heart Cath and Coronary Angiography;  Surgeon: Lamar Blinks, MD;  Location: [facility] INVASIVE CV LAB;  Service: Cardiovascular;  Laterality: Bilateral;  . COLONOSCOPY    . COLONOSCOPY N/A 07/19/2015   Procedure: COLONOSCOPY;  Surgeon: Earline Mayotte, MD;  Location: [facility] ENDOSCOPY;  Service: Endoscopy;  Laterality: N/A;  . FLEXIBLE BRONCHOSCOPY N/A 07/27/2016   Procedure: FLEXIBLE BRONCHOSCOPY;  Surgeon: Yevonne Pax, MD;  Location: [facility] ORS;  Service: Pulmonary;  Laterality: N/A;  . FOOT SURGERY    . KIDNEY STONE SURGERY  7 years  . TOTAL HIP ARTHROPLASTY Left 12/02/2018   Procedure: TOTAL HIP ARTHROPLASTY ANTERIOR APPROACH;  Surgeon: Sheral Apley, MD;  Location: [facility] ORS;  Service: Orthopedics;  Laterality: Left;  . TUBAL LIGATION      Family History  Problem Relation Age of Onset  . Hypertension Mother   . Arthritis Mother   . Hypertension Father   . Arthritis Father   . Prostate cancer Father   . Ovarian cancer Maternal Grandmother    Social History:  reports that she has been smoking cigarettes. She has a 7.00 pack-year smoking history.  She has never used smokeless tobacco. She reports current alcohol use. She reports that she does not use drugs.  Allergies:  Allergies  Allergen Reactions  . Bee Venom Anaphylaxis  . Ciprofloxacin Nausea Only  . Penicillins Rash and Other (See Comments)    Has patient had a PCN reaction causing immediate rash, facial/tongue/throat swelling, SOB or lightheadedness with hypotension: no Has patient had a PCN reaction causing severe rash involving mucus membranes or skin necrosis: no Has patient had a PCN reaction that required hospitalization no Has patient had a PCN reaction occurring within the last 10 years: {yes If all of the above answers are "NO", then may proceed with Cephalosporin use.     Medications:   I have reviewed the patient's current medications. Prior to Admission:  Medications Prior to Admission  Medication Sig Dispense Refill Last Dose  . albuterol (PROVENTIL HFA;VENTOLIN HFA) 108 (90 Base) MCG/ACT inhaler Inhale 2 puffs into the lungs every 4 (four) hours as needed for wheezing or shortness of breath. 1 Inhaler 5 Unknown at PRN  . aspirin EC 81 MG tablet Take 1 tablet (81 mg total) by mouth 2 (two) times daily. For DVT prophylaxis for 30 days after surgery. (Patient taking differently: Take 81 mg by mouth daily. ) 60 tablet 0 03/07/2019 at 0900  . fluticasone (FLONASE) 50 MCG/ACT nasal spray Place 2 sprays into both nostrils daily. 16 g 2 Unknown at Unknown  . furosemide (LASIX) 40 MG tablet Take 1 tablet (40 mg total) by mouth daily as needed for fluid. (Patient taking differently: Take 40 mg by mouth daily. ) 30 tablet 5 03/07/2019 at 0900  . sertraline (ZOLOFT) 100 MG tablet Take 2 tablets (200 mg total) by mouth daily. 60 tablet 5 03/07/2019 at 0900  . venlafaxine XR (EFFEXOR-XR) 75 MG 24 hr capsule Take 1 capsule (75 mg total) by mouth daily with breakfast. 30 capsule 5 03/07/2019 at 0900  . albuterol (PROVENTIL) (2.5 MG/3ML) 0.083% nebulizer solution Take 3 mLs (2.5 mg total) by nebulization every 6 (six) hours as needed for wheezing or shortness of breath. (Patient not taking: Reported on 03/07/2019) 75 mL 12 Completed Course at Unknown time  . docusate sodium (COLACE) 100 MG capsule Take 1 capsule (100 mg total) by mouth 2 (two) times daily. To prevent constipation while taking pain medication. (Patient not taking: Reported on 03/07/2019) 60 capsule 0 Completed Course at Unknown time  . famotidine (PEPCID) 20 MG tablet Take 1 tablet (20 mg total) by mouth 2 (two) times daily. For Gastroprotection 60 tablet 0   . gabapentin (NEURONTIN) 300 MG capsule Take 1 capsule (300 mg total) by mouth 3 (three) times daily for 14 days. For 2 weeks post op for pain. 42 capsule 0   . methocarbamol  (ROBAXIN) 500 MG tablet Take 1 tablet (500 mg total) by mouth every 8 (eight) hours as needed for muscle spasms. (Patient not taking: Reported on 03/07/2019) 40 tablet 0 Completed Course at Unknown time  . neomycin-polymyxin-hydrocortisone (CORTISPORIN) 3.5-10000-1 OTIC suspension Place 4 drops into both ears 2 (two) times daily. (Patient not taking: Reported on 03/07/2019) 10 mL 0 Completed Course at Unknown time  . ondansetron (ZOFRAN) 4 MG tablet Take 1 tablet (4 mg total) by mouth every 8 (eight) hours as needed for nausea or vomiting. (Patient not taking: Reported on 03/07/2019) 20 tablet 0 Completed Course at Unknown time  . tamsulosin (FLOMAX) 0.4 MG CAPS capsule Take 1 capsule (0.4 mg total) by mouth daily. (Patient not  taking: Reported on 03/07/2019) 30 capsule 0 Completed Course at Unknown time   Scheduled: . aspirin  300 mg Rectal Daily   Or  . aspirin EC  325 mg Oral Daily  . docusate sodium  100 mg Oral BID  . enoxaparin (LOVENOX) injection  40 mg Subcutaneous Q24H  . famotidine  20 mg Oral BID  . fluticasone  2 spray Each Nare Daily  . insulin aspart  0-5 Units Subcutaneous QHS  . insulin aspart  0-9 Units Subcutaneous TID WC  . mometasone-formoterol  2 puff Inhalation BID  . [START ON 03/09/2019] pneumococcal 23 valent vaccine  0.5 mL Intramuscular Tomorrow-1000  . tamsulosin  0.4 mg Oral Daily    ROS: History obtained from the patient  General ROS: fatigue Psychological ROS: depression Ophthalmic ROS: negative for - blurry vision, double vision, eye pain or loss of vision ENT ROS: negative for - epistaxis, nasal discharge, oral lesions, sore throat, tinnitus or vertigo Allergy and Immunology ROS: negative for - hives or itchy/watery eyes Hematological and Lymphatic ROS: negative for - bleeding problems, bruising or swollen lymph nodes Endocrine ROS: negative for - galactorrhea, hair pattern changes, polydipsia/polyuria or temperature intolerance Respiratory ROS: negative for  - cough, hemoptysis, shortness of breath or wheezing Cardiovascular ROS: chest pain Gastrointestinal ROS: negative for - abdominal pain, diarrhea, hematemesis, nausea/vomiting or stool incontinence Genito-Urinary ROS: negative for - dysuria, hematuria, incontinence or urinary frequency/urgency Musculoskeletal ROS: negative for - joint swelling or muscular weakness Neurological ROS: as noted in HPI Dermatological ROS: negative for rash and skin lesion changes  Physical Examination: Blood pressure (!) 98/47, pulse 67, temperature 97.7 F (36.5 C), temperature source Oral, resp. rate 18, height 5\' 7"  (1.702 m), weight 111.1 kg, SpO2 100 %.  HEENT-  Normocephalic, no lesions, without obvious abnormality.  Normal external eye and conjunctiva.  Normal TM's bilaterally.  Normal auditory canals and external ears. Normal external nose, mucus membranes and septum.  Normal pharynx. Cardiovascular- S1, S2 normal, pulses palpable throughout   Lungs- chest clear, no wheezing, rales, normal symmetric air entry Abdomen- soft, non-tender; bowel sounds normal; no masses,  no organomegaly Extremities- no edema Lymph-no adenopathy palpable Musculoskeletal-no joint tenderness, deformity or swelling Skin-warm and dry, no hyperpigmentation, vitiligo, or suspicious lesions  Neurological Examination   Mental Status: Alert, oriented, thought content appropriate.  Speech fluent without evidence of aphasia.  Able to follow 3 step commands without difficulty. Cranial Nerves: II: Discs flat bilaterally; LHH, pupils equal, round, reactive to light and accommodation III,IV, VI: ptosis not present, extra-ocular motions intact bilaterally V,VII: right facial droop, facial light touch sensation decreased on the left VIII: hearing normal bilaterally IX,X: gag reflex present XI: bilateral shoulder shrug XII: midline tongue extension Motor: Right : Upper extremity   5/5    Left:     Upper extremity   3/5  Lower  extremity   5-/5     Lower extremity   3/5 Tone and bulk:normal tone throughout; no atrophy noted Sensory: Pinprick and light touch decreased on the left Deep Tendon Reflexes: 2+ and symmetric with absent AJ's bilaterally Plantars: Right: mute   Left: mute Cerebellar: Normal finger-to-nose and normal heel-to-shin testing on the right.  Unable to perform on the left due to weakness Gait: not tested due to safety concerns  Laboratory Studies:  Basic Metabolic Panel: Recent Labs  Lab 03/07/19 1609 03/08/19 0357  NA 139 139  K 3.5 3.2*  CL 105 106  CO2 24 26  GLUCOSE 86 111*  BUN 11 9  CREATININE 0.61 0.73  CALCIUM 8.8* 8.3*    Liver Function Tests: Recent Labs  Lab 03/07/19 1609  AST 20  ALT 12  ALKPHOS 76  BILITOT 0.8  PROT 7.7  ALBUMIN 4.0   No results for input(s): LIPASE, AMYLASE in the last 168 hours. No results for input(s): AMMONIA in the last 168 hours.  CBC: Recent Labs  Lab 03/07/19 1609 03/08/19 0357  WBC 6.6 5.9  NEUTROABS 3.4  --   HGB 13.3 11.7*  HCT 40.0 36.2  MCV 89.9 90.3  PLT 300 278    Cardiac Enzymes: Recent Labs  Lab 03/07/19 1609 03/07/19 2148 03/08/19 0357  TROPONINI <0.03 <0.03 <0.03    BNP: Invalid input(s): POCBNP  CBG: Recent Labs  Lab 03/07/19 1717 03/07/19 1853 03/08/19 0047 03/08/19 0449 03/08/19 0733  GLUCAP 244* 76 110* 111* 75    Microbiology: Results for orders placed or performed during the hospital encounter of 11/25/18  Surgical pcr screen     Status: None   Collection Time: 11/25/18  8:59 AM  Result Value Ref Range Status   MRSA, PCR NEGATIVE NEGATIVE Final   Staphylococcus aureus NEGATIVE NEGATIVE Final    Comment: (NOTE) The Xpert SA Assay (FDA approved for NASAL specimens in patients 222 years of age and older), is one component of a comprehensive surveillance program. It is not intended to diagnose infection nor to guide or monitor treatment. Performed at [facility], [address] [place], [place] [id number]     Coagulation Studies: Recent Labs    03/07/19 1609  LABPROT 12.8  INR 1.0    Urinalysis:  Recent Labs  Lab 03/07/19 1609  COLORURINE STRAW*  LABSPEC 1.012  PHURINE 7.0  GLUCOSEU 50*  HGBUR SMALL*  BILIRUBINUR NEGATIVE  KETONESUR NEGATIVE  PROTEINUR NEGATIVE  NITRITE NEGATIVE  LEUKOCYTESUR SMALL*    Lipid Panel:    Component Value Date/Time   CHOL 164 03/08/2019 0357   CHOL 177 07/02/2018 1309   CHOL 194 11/29/2013 0504   TRIG 136 03/08/2019 0357   TRIG 61 11/29/2013 0504   HDL 36 (L) 03/08/2019 0357   HDL 51 07/02/2018 1309   HDL 43 11/29/2013 0504   CHOLHDL 4.6 03/08/2019 0357   VLDL 27 03/08/2019 0357   VLDL 12 11/29/2013 0504   LDLCALC 101 (H) 03/08/2019 0357   LDLCALC 113 (H) 07/02/2018 1309   LDLCALC 139 (H) 11/29/2013 0504    HgbA1C:  Lab Results  Component Value Date   HGBA1C 4.9 03/08/2019    Urine Drug Screen:      Component Value Date/Time   LABOPIA NONE DETECTED 03/07/2019 1609   COCAINSCRNUR NONE DETECTED 03/07/2019 1609   LABBENZ NONE DETECTED 03/07/2019 1609   AMPHETMU NONE DETECTED 03/07/2019 1609   THCU POSITIVE (A) 03/07/2019 1609   LABBARB NONE DETECTED 03/07/2019 1609    Alcohol Level:  Recent Labs  Lab 03/07/19 1609  ETH <10    Other results: EKG: sinus rhythm at 65 bpm.  Prolonged PR interval.    Imaging: Ct Angio Head W Or Wo Contrast  Result Date: 03/07/2019 CLINICAL DATA:  53 year old female with weakness after seizure. Facial droop. EXAM: CT ANGIOGRAPHY HEAD AND NECK CT PERFUSION BRAIN TECHNIQUE: Multidetector CT imaging of the head and neck was performed using the standard protocol during bolus administration of intravenous contrast. Multiplanar CT image reconstructions and MIPs were obtained to evaluate the vascular anatomy. Carotid stenosis measurements (when applicable) are obtained utilizing NASCET  criteria, using the distal internal carotid diameter as the denominator.  Multiphase CT imaging of the brain was performed following IV bolus contrast injection. Subsequent parametric perfusion maps were calculated using RAPID software. CONTRAST:  OMNIPAQUE IOHEXOL 350 MG/ML SOLN COMPARISON:  Head CT without contrast 1630 hours today. CTA chest abdomen and pelvis earlier today. FINDINGS: CT Brain Perfusion Findings: CBF (<30%) Volume: None. Perfusion (Tmax>6.0s) volume: None. Mismatch Volume: Not applicable Infarction Location:Not applicable CTA NECK Skeleton: No acute osseous abnormality identified. Lower cervical spine disc and endplate degeneration. Upper chest: Mild dependent atelectasis. Other neck: Mild multinodular thyroid goiter. Aortic arch: Mildly bovine arch configuration. No arch atherosclerosis. Right carotid system: Mildly tortuous brachiocephalic and proximal right CCA. Negative right carotid bifurcation. Mildly tortuous cervical right ICA. Left carotid system: Negative left CCA and left carotid bifurcation. Mildly tortuous cervical left ICA. Vertebral arteries: Proximal right subclavian artery and right vertebral artery origins appear normal. The right vertebral artery is patent to the skull base with no stenosis identified. Proximal left subclavian artery and left vertebral artery origins appear normal. The left vertebral artery is patent to the skull base with no stenosis identified. CTA HEAD Posterior circulation: Codominant and normal distal vertebral arteries. Patent PICA origins and vertebrobasilar junction. Patent basilar artery without stenosis. Normal SCA and left PCA origins. Fetal type right PCA origin. Bilateral PCA branches are within normal limits. Anterior circulation: Both ICA siphons are patent. On the left there is no plaque or stenosis. Normal left ophthalmic artery origin. On the right there is no plaque or stenosis. Normal right ophthalmic and posterior communicating artery origins. Patent carotid termini. Normal MCA and ACA origins. Diminutive  or absent anterior communicating artery. Bilateral ACA branches are within normal limits. Left MCA M1 segment and bifurcation are patent without stenosis. Left MCA branches are within normal limits. Right MCA M1 segment and bifurcation are patent without stenosis. Right MCA branches are within normal limits. Venous sinuses: Patent. Anatomic variants: Mildly bovine arch configuration. Fetal type right PCA origin. Delayed phase: No abnormal enhancement identified. Stable gray-white matter differentiation throughout the brain. Review of the MIP images confirms the above findings IMPRESSION: 1. Negative for large vessel occlusion, and CT Perfusion detects no core infarct or ischemic penumbra. 2. No atherosclerosis or arterial stenosis identified in the head or neck. 3. Stable and negative CT appearance of the brain since 1630 hours today. 4. Mild thyroid goiter. Electronically Signed   By: Odessa Fleming M.D.   On: 03/07/2019 18:50   Dg Chest 2 View  Result Date: 03/08/2019 CLINICAL DATA:  Shortness of breath, sarcoid EXAM: CHEST - 2 VIEW COMPARISON:  CTA chest dated 03/07/2019 FINDINGS: Mild linear scarring/atelectasis in the lingula and right lower lobe. No focal consolidation. No pleural effusion or pneumothorax. The heart is normal in size. Degenerative changes of the visualized thoracolumbar spine. IMPRESSION: No evidence of acute cardiopulmonary disease. Electronically Signed   By: Charline Bills M.D.   On: 03/08/2019 09:39   Ct Head Wo Contrast  Result Date: 03/07/2019 CLINICAL DATA:  Altered mental status/unresponsive. Questionable facial droop EXAM: CT HEAD WITHOUT CONTRAST TECHNIQUE: Contiguous axial images were obtained from the base of the skull through the vertex without intravenous contrast. COMPARISON:  October 07, 2017 FINDINGS: Brain: The ventricles are not in size and configuration. There is no intracranial mass, hemorrhage, extra-axial fluid collection, or midline shift. The brain parenchyma  appears unremarkable. No acute infarct evident. Vascular: There is no appreciable hyperdense vessel. There is no appreciable vascular calcification.  Skull: Bony calvarium appears intact. Sinuses/Orbits: There is mucosal thickening in several ethmoid air cells as well as in portions of each sphenoid sinus. Visualized orbits appear symmetric bilaterally. Other: Visualized mastoid air cells are clear. IMPRESSION: Brain parenchyma appears unremarkable. No mass or hemorrhage. Foci of paranasal sinus disease noted. Electronically Signed   By: Bretta Bang III M.D.   On: 03/07/2019 16:53   Ct Angio Neck W And/or Wo Contrast  Result Date: 03/07/2019 CLINICAL DATA:  53 year old female with weakness after seizure. Facial droop. EXAM: CT ANGIOGRAPHY HEAD AND NECK CT PERFUSION BRAIN TECHNIQUE: Multidetector CT imaging of the head and neck was performed using the standard protocol during bolus administration of intravenous contrast. Multiplanar CT image reconstructions and MIPs were obtained to evaluate the vascular anatomy. Carotid stenosis measurements (when applicable) are obtained utilizing NASCET criteria, using the distal internal carotid diameter as the denominator. Multiphase CT imaging of the brain was performed following IV bolus contrast injection. Subsequent parametric perfusion maps were calculated using RAPID software. CONTRAST:  OMNIPAQUE IOHEXOL 350 MG/ML SOLN COMPARISON:  Head CT without contrast 1630 hours today. CTA chest abdomen and pelvis earlier today. FINDINGS: CT Brain Perfusion Findings: CBF (<30%) Volume: None. Perfusion (Tmax>6.0s) volume: None. Mismatch Volume: Not applicable Infarction Location:Not applicable CTA NECK Skeleton: No acute osseous abnormality identified. Lower cervical spine disc and endplate degeneration. Upper chest: Mild dependent atelectasis. Other neck: Mild multinodular thyroid goiter. Aortic arch: Mildly bovine arch configuration. No arch atherosclerosis. Right  carotid system: Mildly tortuous brachiocephalic and proximal right CCA. Negative right carotid bifurcation. Mildly tortuous cervical right ICA. Left carotid system: Negative left CCA and left carotid bifurcation. Mildly tortuous cervical left ICA. Vertebral arteries: Proximal right subclavian artery and right vertebral artery origins appear normal. The right vertebral artery is patent to the skull base with no stenosis identified. Proximal left subclavian artery and left vertebral artery origins appear normal. The left vertebral artery is patent to the skull base with no stenosis identified. CTA HEAD Posterior circulation: Codominant and normal distal vertebral arteries. Patent PICA origins and vertebrobasilar junction. Patent basilar artery without stenosis. Normal SCA and left PCA origins. Fetal type right PCA origin. Bilateral PCA branches are within normal limits. Anterior circulation: Both ICA siphons are patent. On the left there is no plaque or stenosis. Normal left ophthalmic artery origin. On the right there is no plaque or stenosis. Normal right ophthalmic and posterior communicating artery origins. Patent carotid termini. Normal MCA and ACA origins. Diminutive or absent anterior communicating artery. Bilateral ACA branches are within normal limits. Left MCA M1 segment and bifurcation are patent without stenosis. Left MCA branches are within normal limits. Right MCA M1 segment and bifurcation are patent without stenosis. Right MCA branches are within normal limits. Venous sinuses: Patent. Anatomic variants: Mildly bovine arch configuration. Fetal type right PCA origin. Delayed phase: No abnormal enhancement identified. Stable gray-white matter differentiation throughout the brain. Review of the MIP images confirms the above findings IMPRESSION: 1. Negative for large vessel occlusion, and CT Perfusion detects no core infarct or ischemic penumbra. 2. No atherosclerosis or arterial stenosis identified in the  head or neck. 3. Stable and negative CT appearance of the brain since 1630 hours today. 4. Mild thyroid goiter. Electronically Signed   By: Odessa Fleming M.D.   On: 03/07/2019 18:50   Mr Brain Wo Contrast  Result Date: 03/08/2019 CLINICAL DATA:  Left-sided tingling. Possible seizure. Negative CT evaluation yesterday. EXAM: MRI HEAD WITHOUT CONTRAST MRA HEAD WITHOUT CONTRAST TECHNIQUE:  Multiplanar, multiecho pulse sequences of the brain and surrounding structures were obtained without intravenous contrast. Angiographic images of the head were obtained using MRA technique without contrast. COMPARISON:  CT studies 03/07/2019 FINDINGS: MRI HEAD FINDINGS Brain: Diffusion imaging does not show any acute or subacute infarction. The brainstem and cerebellum are normal. There are scattered punctate foci of T2 and FLAIR signal in the frontal white matter, likely indicating the earliest manifestation of small vessel change. No cortical or large vessel territory infarction. No evidence of mass lesion, hemorrhage, hydrocephalus or extra-axial collection. Vascular: Major vessels at the base of the brain show flow. Skull and upper cervical spine: Negative Sinuses/Orbits: Clear/normal Other: None MRA HEAD FINDINGS Both internal carotid arteries are widely patent into the brain. No siphon stenosis. The anterior and middle cerebral vessels are patent without proximal stenosis, aneurysm or vascular malformation. Both vertebral arteries are widely patent to the basilar. No basilar stenosis. Posterior circulation branch vessels appear normal. IMPRESSION: No acute finding. Normal except for scattered punctate foci of T2 and FLAIR signal in the frontal white matter likely indicating the earliest manifestation of small vessel change. Normal intracranial MR angiography of the large and medium size vessels per Electronically Signed   By: Paulina Fusi M.D.   On: 03/08/2019 08:52   Ct Cerebral Perfusion W Contrast  Result Date:  03/07/2019 CLINICAL DATA:  53 year old female with weakness after seizure. Facial droop. EXAM: CT ANGIOGRAPHY HEAD AND NECK CT PERFUSION BRAIN TECHNIQUE: Multidetector CT imaging of the head and neck was performed using the standard protocol during bolus administration of intravenous contrast. Multiplanar CT image reconstructions and MIPs were obtained to evaluate the vascular anatomy. Carotid stenosis measurements (when applicable) are obtained utilizing NASCET criteria, using the distal internal carotid diameter as the denominator. Multiphase CT imaging of the brain was performed following IV bolus contrast injection. Subsequent parametric perfusion maps were calculated using RAPID software. CONTRAST:  OMNIPAQUE IOHEXOL 350 MG/ML SOLN COMPARISON:  Head CT without contrast 1630 hours today. CTA chest abdomen and pelvis earlier today. FINDINGS: CT Brain Perfusion Findings: CBF (<30%) Volume: None. Perfusion (Tmax>6.0s) volume: None. Mismatch Volume: Not applicable Infarction Location:Not applicable CTA NECK Skeleton: No acute osseous abnormality identified. Lower cervical spine disc and endplate degeneration. Upper chest: Mild dependent atelectasis. Other neck: Mild multinodular thyroid goiter. Aortic arch: Mildly bovine arch configuration. No arch atherosclerosis. Right carotid system: Mildly tortuous brachiocephalic and proximal right CCA. Negative right carotid bifurcation. Mildly tortuous cervical right ICA. Left carotid system: Negative left CCA and left carotid bifurcation. Mildly tortuous cervical left ICA. Vertebral arteries: Proximal right subclavian artery and right vertebral artery origins appear normal. The right vertebral artery is patent to the skull base with no stenosis identified. Proximal left subclavian artery and left vertebral artery origins appear normal. The left vertebral artery is patent to the skull base with no stenosis identified. CTA HEAD Posterior circulation: Codominant and  normal distal vertebral arteries. Patent PICA origins and vertebrobasilar junction. Patent basilar artery without stenosis. Normal SCA and left PCA origins. Fetal type right PCA origin. Bilateral PCA branches are within normal limits. Anterior circulation: Both ICA siphons are patent. On the left there is no plaque or stenosis. Normal left ophthalmic artery origin. On the right there is no plaque or stenosis. Normal right ophthalmic and posterior communicating artery origins. Patent carotid termini. Normal MCA and ACA origins. Diminutive or absent anterior communicating artery. Bilateral ACA branches are within normal limits. Left MCA M1 segment and bifurcation are patent without stenosis.  Left MCA branches are within normal limits. Right MCA M1 segment and bifurcation are patent without stenosis. Right MCA branches are within normal limits. Venous sinuses: Patent. Anatomic variants: Mildly bovine arch configuration. Fetal type right PCA origin. Delayed phase: No abnormal enhancement identified. Stable gray-white matter differentiation throughout the brain. Review of the MIP images confirms the above findings IMPRESSION: 1. Negative for large vessel occlusion, and CT Perfusion detects no core infarct or ischemic penumbra. 2. No atherosclerosis or arterial stenosis identified in the head or neck. 3. Stable and negative CT appearance of the brain since 1630 hours today. 4. Mild thyroid goiter. Electronically Signed   By: Odessa Fleming M.D.   On: 03/07/2019 18:50   Mr Maxine Glenn Head/brain ZO Cm  Result Date: 03/08/2019 CLINICAL DATA:  Left-sided tingling. Possible seizure. Negative CT evaluation yesterday. EXAM: MRI HEAD WITHOUT CONTRAST MRA HEAD WITHOUT CONTRAST TECHNIQUE: Multiplanar, multiecho pulse sequences of the brain and surrounding structures were obtained without intravenous contrast. Angiographic images of the head were obtained using MRA technique without contrast. COMPARISON:  CT studies 03/07/2019 FINDINGS: MRI  HEAD FINDINGS Brain: Diffusion imaging does not show any acute or subacute infarction. The brainstem and cerebellum are normal. There are scattered punctate foci of T2 and FLAIR signal in the frontal white matter, likely indicating the earliest manifestation of small vessel change. No cortical or large vessel territory infarction. No evidence of mass lesion, hemorrhage, hydrocephalus or extra-axial collection. Vascular: Major vessels at the base of the brain show flow. Skull and upper cervical spine: Negative Sinuses/Orbits: Clear/normal Other: None MRA HEAD FINDINGS Both internal carotid arteries are widely patent into the brain. No siphon stenosis. The anterior and middle cerebral vessels are patent without proximal stenosis, aneurysm or vascular malformation. Both vertebral arteries are widely patent to the basilar. No basilar stenosis. Posterior circulation branch vessels appear normal. IMPRESSION: No acute finding. Normal except for scattered punctate foci of T2 and FLAIR signal in the frontal white matter likely indicating the earliest manifestation of small vessel change. Normal intracranial MR angiography of the large and medium size vessels per Electronically Signed   By: Paulina Fusi M.D.   On: 03/08/2019 08:52   Ct Angio Chest/abd/pel For Dissection W And/or Wo Contrast  Result Date: 03/07/2019 CLINICAL DATA:  Chest pain.  Unresponsive. EXAM: CT ANGIOGRAPHY CHEST, ABDOMEN AND PELVIS TECHNIQUE: Multidetector CT imaging through the chest, abdomen and pelvis was performed using the standard protocol during bolus administration of intravenous contrast. Multiplanar reconstructed images and MIPs were obtained and reviewed to evaluate the vascular anatomy. CONTRAST:  ISOVUE-370 IOPAMIDOL (ISOVUE-370) INJECTION 76% COMPARISON:  CT abdomen and pelvis 11/13/2018 FINDINGS: CTA CHEST FINDINGS Cardiovascular: Heart is normal size. Aorta is normal caliber. No dissection. Mediastinum/Nodes: No mediastinal,  hilar, or axillary adenopathy. Lungs/Pleura: Dependent atelectasis in the lungs.  No effusions. Musculoskeletal: No acute bony abnormality. Review of the MIP images confirms the above findings. CTA ABDOMEN AND PELVIS FINDINGS VASCULAR Aorta: Normal caliber.  No aneurysm or dissection. Celiac: Widely patent SMA: Widely patent Renals: Single bilaterally, widely patent IMA: Widely patent Inflow: Patent.  No aneurysm or dissection. Veins: Grossly patent. Review of the MIP images confirms the above findings. NON-VASCULAR Hepatobiliary: Insert paddle biliary Pancreas: No focal abnormality or ductal dilatation. Spleen: No focal abnormality.  Normal size. Adrenals/Urinary Tract: No adrenal abnormality. No focal renal abnormality. No stones or hydronephrosis. Urinary bladder is unremarkable. Stomach/Bowel: Normal appendix. Stomach, large and small bowel grossly unremarkable. Lymphatic: Shotty retroperitoneal lymph nodes.  No adenopathy. Reproductive:  Prior hysterectomy.  No adnexal masses. Other: No free fluid or free air. Musculoskeletal: No acute bony abnormality. Prior left hip replacement. Degenerative changes in the lumbar spine. Review of the MIP images confirms the above findings. IMPRESSION: No evidence of aortic aneurysm or dissection. Bibasilar atelectasis.  No acute cardiopulmonary disease. No acute findings in the abdomen or pelvis. Electronically Signed   By: Charlett Nose M.D.   On: 03/07/2019 16:56    Assessment: 53 y.o. female presenting with seizure-like activity and left sided weakness.  Patient with multiple previous similar events.  Work ups in the past have been unrevealing.  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  Patient currently on no anticonvulsant therapy.  Current imaging has been reviewed.  Head CT shows no acute changes.  CTA and CTP show no evidence of large vessel occlusion or perfusion mismatch.  MRI of the brain shows punctate T2 hyperintensities that have been  seen in the past and likely represent small vessel ischemic changes but no acute changes.  Patient currently on ASA.  LDL 101.  A1c 4.9.    Stroke Risk Factors - smoking  Plan: 1. PT consult, OT consult, Speech consult 2. No further stroke work up indicated at this time 3. Prophylactic therapy-Continue ASA 4. Telemetry monitoring 5. Frequent neuro checks 6. Would discontinue Keppra and start Vimpat at  BID.  May likely discontinue all anticonvulsant therapy once clarification made as to diagnosis when outpatient physicians can be contacted in the morning.   7. Seizure precautions 8. Discontinue Tramadol  Thana Farr, MD Neurology [phone] 03/08/2019, 10:23 AM

## 2019-03-08 NOTE — Progress Notes (Signed)
[employer] [facility] [place] [facility]   PATIENT NAME: Nicole Wyatt    MR#:  [id number]  DATE OF BIRTH:  [birth year]  SUBJECTIVE:  CHIEF COMPLAINT:   Chief Complaint  Patient presents with  . Altered Mental Status  . Chest Pain   Came with mixed complaint of chest pain, numbness, left-sided weakness, tingling.  Complaints are resolved.  She also had some shaking/seizures with EMS.  Currently no complaints. REVIEW OF SYSTEMS:  CONSTITUTIONAL: No fever, fatigue or weakness.  EYES: No blurred or double vision.  EARS, NOSE, AND THROAT: No tinnitus or ear pain.  RESPIRATORY: No cough, shortness of breath, wheezing or hemoptysis.  CARDIOVASCULAR: No chest pain, orthopnea, edema.  GASTROINTESTINAL: No nausea, vomiting, diarrhea or abdominal pain.  GENITOURINARY: No dysuria, hematuria.  ENDOCRINE: No polyuria, nocturia,  HEMATOLOGY: No anemia, easy bruising or bleeding SKIN: No rash or lesion. MUSCULOSKELETAL: No joint pain or arthritis.   NEUROLOGIC: No tingling, numbness, weakness.  PSYCHIATRY: No anxiety or depression.   ROS  DRUG ALLERGIES:   Allergies  Allergen Reactions  . Bee Venom Anaphylaxis  . Ciprofloxacin Nausea Only  . Penicillins Rash and Other (See Comments)    Has patient had a PCN reaction causing immediate rash, facial/tongue/throat swelling, SOB or lightheadedness with hypotension: no Has patient had a PCN reaction causing severe rash involving mucus membranes or skin necrosis: no Has patient had a PCN reaction that required hospitalization no Has patient had a PCN reaction occurring within the last 10 years: {yes If all of the above answers are "NO", then may proceed with Cephalosporin use.     VITALS:  Blood pressure (!) 98/47, pulse 67, temperature 97.7 F (36.5 C), temperature source Oral, resp. rate 18, height  (1.702 m), weight 111.1 kg, SpO2 100 %.  PHYSICAL EXAMINATION:  GENERAL:  53 y.o.-year-old patient lying in the bed with  no acute distress.  EYES: Pupils equal, round, reactive to light and accommodation. No scleral icterus. Extraocular muscles intact.  HEENT: Head atraumatic, normocephalic. Oropharynx and nasopharynx clear.  NECK:  Supple, no jugular venous distention. No thyroid enlargement, no tenderness.  LUNGS: Normal breath sounds bilaterally, no wheezing, rales,rhonchi or crepitation. No use of accessory muscles of respiration.  CARDIOVASCULAR: S1, S2 normal. No murmurs, rubs, or gallops.  ABDOMEN: Soft, nontender, nondistended. Bowel sounds present. No organomegaly or mass.  EXTREMITIES: No pedal edema, cyanosis, or clubbing.  NEUROLOGIC: Cranial nerves II through XII are intact. Muscle strength 5/5 in all extremities. Sensation intact. Gait not checked.  PSYCHIATRIC: The patient is alert and oriented x 3.  SKIN: No obvious rash, lesion, or ulcer.   Physical Exam LABORATORY PANEL:   CBC Recent Labs  Lab 03/08/19 0357  WBC 5.9  HGB 11.7*  HCT 36.2  PLT 278   ------------------------------------------------------------------------------------------------------------------  Chemistries  Recent Labs  Lab 03/07/19 1609 03/08/19 0357  NA 139 139  K 3.5 3.2*  CL 105 106  CO2 24 26  GLUCOSE 86 111*  BUN 11 9  CREATININE 0.61 0.73  CALCIUM 8.8* 8.3*  AST 20  --   ALT 12  --   ALKPHOS 76  --   BILITOT 0.8  --    ------------------------------------------------------------------------------------------------------------------  Cardiac Enzymes Recent Labs  Lab 03/08/19 0357 03/08/19 1016  TROPONINI <0.03 <0.03   ------------------------------------------------------------------------------------------------------------------  RADIOLOGY:  Ct Angio Head W Or Wo Contrast  Result Date: 03/07/2019 CLINICAL DATA:  53 year old female with weakness after seizure. Facial droop. EXAM: CT ANGIOGRAPHY HEAD AND NECK  CT PERFUSION BRAIN TECHNIQUE: Multidetector CT imaging of the head and neck was  performed using the standard protocol during bolus administration of intravenous contrast. Multiplanar CT image reconstructions and MIPs were obtained to evaluate the vascular anatomy. Carotid stenosis measurements (when applicable) are obtained utilizing NASCET criteria, using the distal internal carotid diameter as the denominator. Multiphase CT imaging of the brain was performed following IV bolus contrast injection. Subsequent parametric perfusion maps were calculated using RAPID software. CONTRAST:  OMNIPAQUE IOHEXOL 350 MG/ML SOLN COMPARISON:  Head CT without contrast 1630 hours today. CTA chest abdomen and pelvis earlier today. FINDINGS: CT Brain Perfusion Findings: CBF (<30%) Volume: None. Perfusion (Tmax>6.0s) volume: None. Mismatch Volume: Not applicable Infarction Location:Not applicable CTA NECK Skeleton: No acute osseous abnormality identified. Lower cervical spine disc and endplate degeneration. Upper chest: Mild dependent atelectasis. Other neck: Mild multinodular thyroid goiter. Aortic arch: Mildly bovine arch configuration. No arch atherosclerosis. Right carotid system: Mildly tortuous brachiocephalic and proximal right CCA. Negative right carotid bifurcation. Mildly tortuous cervical right ICA. Left carotid system: Negative left CCA and left carotid bifurcation. Mildly tortuous cervical left ICA. Vertebral arteries: Proximal right subclavian artery and right vertebral artery origins appear normal. The right vertebral artery is patent to the skull base with no stenosis identified. Proximal left subclavian artery and left vertebral artery origins appear normal. The left vertebral artery is patent to the skull base with no stenosis identified. CTA HEAD Posterior circulation: Codominant and normal distal vertebral arteries. Patent PICA origins and vertebrobasilar junction. Patent basilar artery without stenosis. Normal SCA and left PCA origins. Fetal type right PCA origin. Bilateral PCA branches  are within normal limits. Anterior circulation: Both ICA siphons are patent. On the left there is no plaque or stenosis. Normal left ophthalmic artery origin. On the right there is no plaque or stenosis. Normal right ophthalmic and posterior communicating artery origins. Patent carotid termini. Normal MCA and ACA origins. Diminutive or absent anterior communicating artery. Bilateral ACA branches are within normal limits. Left MCA M1 segment and bifurcation are patent without stenosis. Left MCA branches are within normal limits. Right MCA M1 segment and bifurcation are patent without stenosis. Right MCA branches are within normal limits. Venous sinuses: Patent. Anatomic variants: Mildly bovine arch configuration. Fetal type right PCA origin. Delayed phase: No abnormal enhancement identified. Stable gray-white matter differentiation throughout the brain. Review of the MIP images confirms the above findings IMPRESSION: 1. Negative for large vessel occlusion, and CT Perfusion detects no core infarct or ischemic penumbra. 2. No atherosclerosis or arterial stenosis identified in the head or neck. 3. Stable and negative CT appearance of the brain since 1630 hours today. 4. Mild thyroid goiter. Electronically Signed   By: Odessa Fleming M.D.   On: 03/07/2019 18:50   Dg Chest 2 View  Result Date: 03/08/2019 CLINICAL DATA:  Shortness of breath, sarcoid EXAM: CHEST - 2 VIEW COMPARISON:  CTA chest dated 03/07/2019 FINDINGS: Mild linear scarring/atelectasis in the lingula and right lower lobe. No focal consolidation. No pleural effusion or pneumothorax. The heart is normal in size. Degenerative changes of the visualized thoracolumbar spine. IMPRESSION: No evidence of acute cardiopulmonary disease. Electronically Signed   By: Charline Bills M.D.   On: 03/08/2019 09:39   Ct Head Wo Contrast  Result Date: 03/07/2019 CLINICAL DATA:  Altered mental status/unresponsive. Questionable facial droop EXAM: CT HEAD WITHOUT CONTRAST  TECHNIQUE: Contiguous axial images were obtained from the base of the skull through the vertex without intravenous contrast. COMPARISON:  October 07, 2017 FINDINGS: Brain: The ventricles are not in size and configuration. There is no intracranial mass, hemorrhage, extra-axial fluid collection, or midline shift. The brain parenchyma appears unremarkable. No acute infarct evident. Vascular: There is no appreciable hyperdense vessel. There is no appreciable vascular calcification. Skull: Bony calvarium appears intact. Sinuses/Orbits: There is mucosal thickening in several ethmoid air cells as well as in portions of each sphenoid sinus. Visualized orbits appear symmetric bilaterally. Other: Visualized mastoid air cells are clear. IMPRESSION: Brain parenchyma appears unremarkable. No mass or hemorrhage. Foci of paranasal sinus disease noted. Electronically Signed   By: Bretta Bang III M.D.   On: 03/07/2019 16:53   Ct Angio Neck W And/or Wo Contrast  Result Date: 03/07/2019 CLINICAL DATA:  53 year old female with weakness after seizure. Facial droop. EXAM: CT ANGIOGRAPHY HEAD AND NECK CT PERFUSION BRAIN TECHNIQUE: Multidetector CT imaging of the head and neck was performed using the standard protocol during bolus administration of intravenous contrast. Multiplanar CT image reconstructions and MIPs were obtained to evaluate the vascular anatomy. Carotid stenosis measurements (when applicable) are obtained utilizing NASCET criteria, using the distal internal carotid diameter as the denominator. Multiphase CT imaging of the brain was performed following IV bolus contrast injection. Subsequent parametric perfusion maps were calculated using RAPID software. CONTRAST:  OMNIPAQUE IOHEXOL 350 MG/ML SOLN COMPARISON:  Head CT without contrast 1630 hours today. CTA chest abdomen and pelvis earlier today. FINDINGS: CT Brain Perfusion Findings: CBF (<30%) Volume: None. Perfusion (Tmax>6.0s) volume: None. Mismatch  Volume: Not applicable Infarction Location:Not applicable CTA NECK Skeleton: No acute osseous abnormality identified. Lower cervical spine disc and endplate degeneration. Upper chest: Mild dependent atelectasis. Other neck: Mild multinodular thyroid goiter. Aortic arch: Mildly bovine arch configuration. No arch atherosclerosis. Right carotid system: Mildly tortuous brachiocephalic and proximal right CCA. Negative right carotid bifurcation. Mildly tortuous cervical right ICA. Left carotid system: Negative left CCA and left carotid bifurcation. Mildly tortuous cervical left ICA. Vertebral arteries: Proximal right subclavian artery and right vertebral artery origins appear normal. The right vertebral artery is patent to the skull base with no stenosis identified. Proximal left subclavian artery and left vertebral artery origins appear normal. The left vertebral artery is patent to the skull base with no stenosis identified. CTA HEAD Posterior circulation: Codominant and normal distal vertebral arteries. Patent PICA origins and vertebrobasilar junction. Patent basilar artery without stenosis. Normal SCA and left PCA origins. Fetal type right PCA origin. Bilateral PCA branches are within normal limits. Anterior circulation: Both ICA siphons are patent. On the left there is no plaque or stenosis. Normal left ophthalmic artery origin. On the right there is no plaque or stenosis. Normal right ophthalmic and posterior communicating artery origins. Patent carotid termini. Normal MCA and ACA origins. Diminutive or absent anterior communicating artery. Bilateral ACA branches are within normal limits. Left MCA M1 segment and bifurcation are patent without stenosis. Left MCA branches are within normal limits. Right MCA M1 segment and bifurcation are patent without stenosis. Right MCA branches are within normal limits. Venous sinuses: Patent. Anatomic variants: Mildly bovine arch configuration. Fetal type right PCA origin. Delayed  phase: No abnormal enhancement identified. Stable gray-white matter differentiation throughout the brain. Review of the MIP images confirms the above findings IMPRESSION: 1. Negative for large vessel occlusion, and CT Perfusion detects no core infarct or ischemic penumbra. 2. No atherosclerosis or arterial stenosis identified in the head or neck. 3. Stable and negative CT appearance of the brain since 1630 hours today. 4. Mild thyroid  goiter. Electronically Signed   By: Odessa Fleming M.D.   On: 03/07/2019 18:50   Mr Brain Wo Contrast  Result Date: 03/08/2019 CLINICAL DATA:  Left-sided tingling. Possible seizure. Negative CT evaluation yesterday. EXAM: MRI HEAD WITHOUT CONTRAST MRA HEAD WITHOUT CONTRAST TECHNIQUE: Multiplanar, multiecho pulse sequences of the brain and surrounding structures were obtained without intravenous contrast. Angiographic images of the head were obtained using MRA technique without contrast. COMPARISON:  CT studies 03/07/2019 FINDINGS: MRI HEAD FINDINGS Brain: Diffusion imaging does not show any acute or subacute infarction. The brainstem and cerebellum are normal. There are scattered punctate foci of T2 and FLAIR signal in the frontal white matter, likely indicating the earliest manifestation of small vessel change. No cortical or large vessel territory infarction. No evidence of mass lesion, hemorrhage, hydrocephalus or extra-axial collection. Vascular: Major vessels at the base of the brain show flow. Skull and upper cervical spine: Negative Sinuses/Orbits: Clear/normal Other: None MRA HEAD FINDINGS Both internal carotid arteries are widely patent into the brain. No siphon stenosis. The anterior and middle cerebral vessels are patent without proximal stenosis, aneurysm or vascular malformation. Both vertebral arteries are widely patent to the basilar. No basilar stenosis. Posterior circulation branch vessels appear normal. IMPRESSION: No acute finding. Normal except for scattered punctate  foci of T2 and FLAIR signal in the frontal white matter likely indicating the earliest manifestation of small vessel change. Normal intracranial MR angiography of the large and medium size vessels per Electronically Signed   By: Paulina Fusi M.D.   On: 03/08/2019 08:52   Ct Cerebral Perfusion W Contrast  Result Date: 03/07/2019 CLINICAL DATA:  53 year old female with weakness after seizure. Facial droop. EXAM: CT ANGIOGRAPHY HEAD AND NECK CT PERFUSION BRAIN TECHNIQUE: Multidetector CT imaging of the head and neck was performed using the standard protocol during bolus administration of intravenous contrast. Multiplanar CT image reconstructions and MIPs were obtained to evaluate the vascular anatomy. Carotid stenosis measurements (when applicable) are obtained utilizing NASCET criteria, using the distal internal carotid diameter as the denominator. Multiphase CT imaging of the brain was performed following IV bolus contrast injection. Subsequent parametric perfusion maps were calculated using RAPID software. CONTRAST:  OMNIPAQUE IOHEXOL 350 MG/ML SOLN COMPARISON:  Head CT without contrast 1630 hours today. CTA chest abdomen and pelvis earlier today. FINDINGS: CT Brain Perfusion Findings: CBF (<30%) Volume: None. Perfusion (Tmax>6.0s) volume: None. Mismatch Volume: Not applicable Infarction Location:Not applicable CTA NECK Skeleton: No acute osseous abnormality identified. Lower cervical spine disc and endplate degeneration. Upper chest: Mild dependent atelectasis. Other neck: Mild multinodular thyroid goiter. Aortic arch: Mildly bovine arch configuration. No arch atherosclerosis. Right carotid system: Mildly tortuous brachiocephalic and proximal right CCA. Negative right carotid bifurcation. Mildly tortuous cervical right ICA. Left carotid system: Negative left CCA and left carotid bifurcation. Mildly tortuous cervical left ICA. Vertebral arteries: Proximal right subclavian artery and right vertebral artery  origins appear normal. The right vertebral artery is patent to the skull base with no stenosis identified. Proximal left subclavian artery and left vertebral artery origins appear normal. The left vertebral artery is patent to the skull base with no stenosis identified. CTA HEAD Posterior circulation: Codominant and normal distal vertebral arteries. Patent PICA origins and vertebrobasilar junction. Patent basilar artery without stenosis. Normal SCA and left PCA origins. Fetal type right PCA origin. Bilateral PCA branches are within normal limits. Anterior circulation: Both ICA siphons are patent. On the left there is no plaque or stenosis. Normal left ophthalmic artery origin. On the  right there is no plaque or stenosis. Normal right ophthalmic and posterior communicating artery origins. Patent carotid termini. Normal MCA and ACA origins. Diminutive or absent anterior communicating artery. Bilateral ACA branches are within normal limits. Left MCA M1 segment and bifurcation are patent without stenosis. Left MCA branches are within normal limits. Right MCA M1 segment and bifurcation are patent without stenosis. Right MCA branches are within normal limits. Venous sinuses: Patent. Anatomic variants: Mildly bovine arch configuration. Fetal type right PCA origin. Delayed phase: No abnormal enhancement identified. Stable gray-white matter differentiation throughout the brain. Review of the MIP images confirms the above findings IMPRESSION: 1. Negative for large vessel occlusion, and CT Perfusion detects no core infarct or ischemic penumbra. 2. No atherosclerosis or arterial stenosis identified in the head or neck. 3. Stable and negative CT appearance of the brain since 1630 hours today. 4. Mild thyroid goiter. Electronically Signed   By: Odessa Fleming M.D.   On: 03/07/2019 18:50   Mr Maxine Glenn Head/brain ZO Cm  Result Date: 03/08/2019 CLINICAL DATA:  Left-sided tingling. Possible seizure. Negative CT evaluation yesterday. EXAM:  MRI HEAD WITHOUT CONTRAST MRA HEAD WITHOUT CONTRAST TECHNIQUE: Multiplanar, multiecho pulse sequences of the brain and surrounding structures were obtained without intravenous contrast. Angiographic images of the head were obtained using MRA technique without contrast. COMPARISON:  CT studies 03/07/2019 FINDINGS: MRI HEAD FINDINGS Brain: Diffusion imaging does not show any acute or subacute infarction. The brainstem and cerebellum are normal. There are scattered punctate foci of T2 and FLAIR signal in the frontal white matter, likely indicating the earliest manifestation of small vessel change. No cortical or large vessel territory infarction. No evidence of mass lesion, hemorrhage, hydrocephalus or extra-axial collection. Vascular: Major vessels at the base of the brain show flow. Skull and upper cervical spine: Negative Sinuses/Orbits: Clear/normal Other: None MRA HEAD FINDINGS Both internal carotid arteries are widely patent into the brain. No siphon stenosis. The anterior and middle cerebral vessels are patent without proximal stenosis, aneurysm or vascular malformation. Both vertebral arteries are widely patent to the basilar. No basilar stenosis. Posterior circulation branch vessels appear normal. IMPRESSION: No acute finding. Normal except for scattered punctate foci of T2 and FLAIR signal in the frontal white matter likely indicating the earliest manifestation of small vessel change. Normal intracranial MR angiography of the large and medium size vessels per Electronically Signed   By: Paulina Fusi M.D.   On: 03/08/2019 08:52   Ct Angio Chest/abd/pel For Dissection W And/or Wo Contrast  Result Date: 03/07/2019 CLINICAL DATA:  Chest pain.  Unresponsive. EXAM: CT ANGIOGRAPHY CHEST, ABDOMEN AND PELVIS TECHNIQUE: Multidetector CT imaging through the chest, abdomen and pelvis was performed using the standard protocol during bolus administration of intravenous contrast. Multiplanar reconstructed images and  MIPs were obtained and reviewed to evaluate the vascular anatomy. CONTRAST:  ISOVUE-370 IOPAMIDOL (ISOVUE-370) INJECTION 76% COMPARISON:  CT abdomen and pelvis 11/13/2018 FINDINGS: CTA CHEST FINDINGS Cardiovascular: Heart is normal size. Aorta is normal caliber. No dissection. Mediastinum/Nodes: No mediastinal, hilar, or axillary adenopathy. Lungs/Pleura: Dependent atelectasis in the lungs.  No effusions. Musculoskeletal: No acute bony abnormality. Review of the MIP images confirms the above findings. CTA ABDOMEN AND PELVIS FINDINGS VASCULAR Aorta: Normal caliber.  No aneurysm or dissection. Celiac: Widely patent SMA: Widely patent Renals: Single bilaterally, widely patent IMA: Widely patent Inflow: Patent.  No aneurysm or dissection. Veins: Grossly patent. Review of the MIP images confirms the above findings. NON-VASCULAR Hepatobiliary: Insert paddle biliary Pancreas: No focal abnormality  or ductal dilatation. Spleen: No focal abnormality.  Normal size. Adrenals/Urinary Tract: No adrenal abnormality. No focal renal abnormality. No stones or hydronephrosis. Urinary bladder is unremarkable. Stomach/Bowel: Normal appendix. Stomach, large and small bowel grossly unremarkable. Lymphatic: Shotty retroperitoneal lymph nodes.  No adenopathy. Reproductive: Prior hysterectomy.  No adnexal masses. Other: No free fluid or free air. Musculoskeletal: No acute bony abnormality. Prior left hip replacement. Degenerative changes in the lumbar spine. Review of the MIP images confirms the above findings. IMPRESSION: No evidence of aortic aneurysm or dissection. Bibasilar atelectasis.  No acute cardiopulmonary disease. No acute findings in the abdomen or pelvis. Electronically Signed   By: Charlett Nose M.D.   On: 03/07/2019 16:56    ASSESSMENT AND PLAN:   Active Problems:   Arm weakness  *Acute left arm / leg weakness with altered mental status Acute stroke is ruled out by negative MRI.  CT angiogram head and neck is  done without any abnormalities.  PT/OT/speech therapy to evaluate/treat, increase nursing care PRN,   treat hypoglycemia, seizure precautions, avoid unnecessary sedating agents, and continue close medical monitoring Appreciate help by neurologist, started on Vimpat for now and suggested to contact her primary neurologist tomorrow to decide need for antiseizure medications.  *Acute possible recurrent seizure Note patient was taken off antiepileptic drugs 6 months ago by neurology/Dr. 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 Sherryll Burger tomorrow.  *Acute hypoglycemia Continue D10, hypoglycemic protocol, Accu-Cheks every 2 hours , improved now.  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 Management plans discussed with the patient, family and they are in agreement.  CODE STATUS: Full.  TOTAL TIME TAKING CARE OF THIS PATIENT: 35 minutes.     POSSIBLE D/C IN 1-2 DAYS, DEPENDING ON CLINICAL CONDITION.   Altamese Dilling M.D on 03/08/2019   Between 7am to 6pm - Pager - [phone]  After 6pm go to [email or web address] - password [email or web address]  [facility]  Office  [phone]  CC: Primary care physician; Carlean Jews, NP  Note: This dictation was prepared with Dragon dictation along with smaller phrase technology. Any transcriptional errors that result from this process are unintentional.

## 2019-03-09 ENCOUNTER — Inpatient Hospital Stay: Payer: Medicare Other

## 2019-03-09 DIAGNOSIS — F331 Major depressive disorder, recurrent, moderate: Secondary | ICD-10-CM

## 2019-03-09 DIAGNOSIS — I639 Cerebral infarction, unspecified: Secondary | ICD-10-CM

## 2019-03-09 DIAGNOSIS — Z5181 Encounter for therapeutic drug level monitoring: Secondary | ICD-10-CM

## 2019-03-09 LAB — GLUCOSE, CAPILLARY
GLUCOSE-CAPILLARY: 107 mg/dL — AB (ref 70–99)
Glucose-Capillary: 123 mg/dL — ABNORMAL HIGH (ref 70–99)
Glucose-Capillary: 87 mg/dL (ref 70–99)
Glucose-Capillary: 87 mg/dL (ref 70–99)
Glucose-Capillary: 95 mg/dL (ref 70–99)
Glucose-Capillary: 96 mg/dL (ref 70–99)

## 2019-03-09 LAB — TROPONIN I: Troponin I: <0.03 ng/mL (ref <0.03)

## 2019-03-09 MED ORDER — VENLAFAXINE HCL ER 75 MG PO CP24
75.0000 mg | ORAL_CAPSULE | Freq: Every day | ORAL | Status: DC
  Administered 2019-03-10: 09:00:00 75 mg via ORAL
  Filled 2019-03-09: qty 1

## 2019-03-09 MED ORDER — POTASSIUM CHLORIDE 20 MEQ PO PACK
40.0000 meq | PACK | Freq: Once | ORAL | Status: DC
  Filled 2019-03-09: qty 2

## 2019-03-09 MED ORDER — POTASSIUM CHLORIDE CRYS ER 20 MEQ PO TBCR
40.0000 meq | EXTENDED_RELEASE_TABLET | Freq: Once | ORAL | Status: AC
  Administered 2019-03-09: 17:00:00 40 meq via ORAL
  Filled 2019-03-09: qty 2

## 2019-03-09 MED ORDER — SERTRALINE HCL 50 MG PO TABS: 100.0000 mg | ORAL_TABLET | Freq: Every day | ORAL | Status: DC

## 2019-03-09 MED ORDER — SODIUM CHLORIDE 0.9 % IV SOLN
1.0000 g | INTRAVENOUS | Status: DC
  Administered 2019-03-09 – 2019-03-10 (×2): 1 g via INTRAVENOUS
  Filled 2019-03-09: qty 10
  Filled 2019-03-09 (×2): qty 1

## 2019-03-09 MED ORDER — SODIUM CHLORIDE 0.9 % IV SOLN
INTRAVENOUS | Status: DC | PRN
  Administered 2019-03-09: 250 mL via INTRAVENOUS

## 2019-03-09 MED ORDER — SERTRALINE HCL 100 MG PO TABS
200.0000 mg | ORAL_TABLET | Freq: Every day | ORAL | Status: DC
  Filled 2019-03-09: qty 2

## 2019-03-09 NOTE — Procedures (Signed)
History: 53 year old female being evaluated for left-sided weakness.  Sedation: None  Technique: This is a 21 channel routine scalp EEG performed at the bedside with bipolar and monopolar montages arranged in accordance to the international 10/20 system of electrode placement. One channel was dedicated to EKG recording.    Background: The background consists of intermixed alpha and beta activities. There is a well defined high voltage posterior dominant rhythm of 9 Hz that attenuates with eye opening. Sleep is recorded with normal appearing structures.   Photic stimulation: Physiologic driving is not performed  EEG Abnormalities: None  Clinical Interpretation: This normal EEG is recorded in the waking and sleep state. There was no seizure or seizure predisposition recorded on this study. Please note that lack of epileptiform activity on EEG does not preclude the possibility of epilepsy.   Ritta Slot, MD [facility] [phone]  If 7pm- 7am, please page neurology on call as listed in [facility]ION.

## 2019-03-09 NOTE — Progress Notes (Signed)
eeg completed ° °

## 2019-03-09 NOTE — TOC Initial Note (Signed)
Transition of Care [facility]) - Initial/Assessment Note    Patient Details  Name: Nicole Wyatt MRN: [id number] Date of Birth: 08-Jun-1966  Transition of Care [facility]) CM/SW Contact:    Gwenette Greet, RN Phone Number: 03/09/2019, 10:48 AM  Clinical Narrative:   Admitted to [facility] with the diagnosis of rm weakness. Lives with parents. Last seen Dr. Hermenia Fiscal 02/24/19. Prescriptions are filled at [facility] in [place].  Takes care of all basic activities of daily living herself, drives.  [facility] in the past. No skilled facility. Home oxygen at night only x 4 years. Np falls.O.K appetite. Daughter will transport Daughter is Reva Bores [phone]                Expected Discharge Plan: Home w Home Health Services Barriers to Discharge: (Declining skilled nursing facility)   Patient Goals and CMS Choice Patient states their goals for this hospitalization and ongoing recovery are:: (Home with Home Health) CMS Medicare.gov Compare Post Acute Care list provided to:: Patient Choice offered to / list presented to : Patient  [facility].   Expected Discharge Plan and Services Expected Discharge Plan: Home w Home Health Services Discharge Planning Services: CM Consult Post Acute Care Choice: (Wants shower chair) Living arrangements for the past 2 months: (Lives with parents, who are being seen by Hospice)                 DME Arranged: Shower stool DME Agency: [facility] HH Arranged: RN, PT(Not yet) HH Agency: [facility]  Prior Living Arrangements/Services Living arrangements for the past 2 months: (Lives with parents, who are being seen by Hospice) Lives with:: Relatives Patient language and need for interpreter reviewed:: No Do you feel safe going back to the place where you live?: Yes      Need for Family Participation in Patient Care: No (Comment) Care giver support system in place?: Yes  (comment) Current home services: (None) Criminal Activity/Legal Involvement Pertinent to Current Situation/Hospitalization: No - Comment as needed  Activities of Daily Living Home Assistive Devices/Equipment: CPAP ADL Screening (condition at time of admission) Patient's cognitive ability adequate to safely complete daily activities?: Yes Is the patient deaf or have difficulty hearing?: No Does the patient have difficulty seeing, even when wearing glasses/contacts?: No Does the patient have difficulty concentrating, remembering, or making decisions?: No Patient able to express need for assistance with ADLs?: Yes Does the patient have difficulty dressing or bathing?: No Independently performs ADLs?: Yes (appropriate for developmental age) Does the patient have difficulty walking or climbing stairs?: No Weakness of Legs: Left Weakness of Arms/Hands: Left  Permission Sought/Granted Permission sought to share information with : Case Manager Permission granted to share information with : Yes, Verbal Permission Granted              Emotional Assessment Appearance:: Appears stated age Attitude/Demeanor/Rapport: (accepting) Affect (typically observed): Accepting Orientation: : Oriented to Self, Oriented to Place, Oriented to  Time, Oriented to Situation   Psych Involvement: No (comment)  Admission diagnosis:  Seizure (HCC) [R56.9] Weakness [R53.1] Chest pain, unspecified type [R07.9] Patient Active Problem List   Diagnosis Date Noted  . Arm weakness 03/07/2019  . Influenza A 02/24/2019  . Acute upper respiratory infection 02/24/2019  . Chronic obstructive pulmonary disease (HCC) 02/24/2019  . Acute otitis externa of both ears 01/06/2019  . Mild intermittent asthma without complication 01/06/2019  . Primary osteoarthritis of hip 12/02/2018  . Primary osteoarthritis of  left hip 10/13/2018  . OSA (obstructive sleep apnea) 10/13/2018  . 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 [place]  spotted fever 07/02/2018  . Urinary tract infection without hematuria 07/02/2018  . Dysuria 06/23/2018  . Depression, major, recurrent, moderate (HCC) 03/26/2018  . Generalized edema 03/26/2018  . Anaphylactic syndrome 03/26/2018  . Cutaneous candidiasis 03/26/2018  . Bursitis of hip 02/03/2018  . Osteoarthritis of knee 02/03/2018  . Chronic pulmonary hypertension (HCC) 06/18/2016  . S/P cardiac cath 06/18/2016  . Unstable angina (HCC) 05/24/2016  . Bilateral leg edema 05/22/2016  . Shortness of breath 04/30/2016  . Skin lesion of right arm 03/01/2016  . Screening for breast cancer 06/10/2015  . Skin lesion of breast 06/10/2015  . Hx of seizure disorder 06/24/2014  . Left-sided weakness 06/24/2014  . Tobacco abuse 06/24/2014   PCP:  Carlean Jews, NP Pharmacy:   [facility] [address] [place] [address] Phone: [phone] Fax: [phone]  [facility] - [place], [place] - [address] [place] [address] Phone: [phone] Fax: [phone]     Social Determinants of Health (SDOH) Interventions    Readmission Risk Interventions 30 Day Unplanned Readmission Risk Score     ED to Hosp-Admission (Current) from 03/07/2019 in [facility] (1C)  30 Day Unplanned Readmission Risk Score (%)  20 Filed at 03/09/2019 0801     This score is the patient's risk of an unplanned readmission within 30 days of being discharged (0 -100%). The score is based on dignosis, age, lab data, medications, orders, and past utilization.   Low:  0-14.9   Medium: 15-21.9   High: 22-29.9   Extreme: 30 and above       No flowsheet data found.

## 2019-03-09 NOTE — Progress Notes (Signed)
SLP Cancellation Note  Patient Details Name: Nicole Wyatt MRN: [id number] DOB: 10-28-1966   Cancelled treatment:       Reason Eval/Treat Not Completed: SLP screened, no needs identified, will sign off  Patient reports no change in communication or cognition.    Dollene Primrose, MS/CCC- SLP  Leandrew Koyanagi 03/09/2019, 9:12 AM

## 2019-03-09 NOTE — Progress Notes (Signed)
Pt reported that she had L chest pain aroud 0900, but did not reported it to staff. Per pt pain was sharp with tingling sensation to L arm.  Pt denies pain at this time. Pt has decreased sensation to L arm which is not a new symptom.  Currently VSS. NIH 6.  No neuro changes.   Notified Dr Elisabeth Pigeon of the above. Per MD to order troponin once.

## 2019-03-09 NOTE — Progress Notes (Signed)
Subjective: No seizure activity seen.    Past Medical History:  Diagnosis Date  . Abnormal Pap smear of cervix   . Anxiety   . Arthritis   . Bipolar 1 disorder (HCC)   . COPD (chronic obstructive pulmonary disease) (HCC)   . Depression   . Dyspnea    with exertion   . Kidney stone   . Oxygen dependent    patient uses 2L oz PRN ; reports hardly ever uses it unless very SOB    . [place] spotted fever 2018  . Sarcoidosis of lung (HCC)    managed by Dr Lindie Spruce   . Seizures (HCC) last seizure 05/2013  . Sleep apnea    uses CPAP nightly   . UTI (urinary tract infection) 11/13/2018   see ED visit in epic ; was dc'd with oral abx and reports completed and relief of sx     Past Surgical History:  Procedure Laterality Date  . ABDOMINAL HYSTERECTOMY  1999  . BLADDER SURGERY    . CARDIAC CATHETERIZATION Bilateral 05/29/2016   Procedure: Right/Left Heart Cath and Coronary Angiography;  Surgeon: Lamar Blinks, MD;  Location: [facility] INVASIVE CV LAB;  Service: Cardiovascular;  Laterality: Bilateral;  . COLONOSCOPY    . COLONOSCOPY N/A 07/19/2015   Procedure: COLONOSCOPY;  Surgeon: Earline Mayotte, MD;  Location: [facility] ENDOSCOPY;  Service: Endoscopy;  Laterality: N/A;  . FLEXIBLE BRONCHOSCOPY N/A 07/27/2016   Procedure: FLEXIBLE BRONCHOSCOPY;  Surgeon: Yevonne Pax, MD;  Location: [facility] ORS;  Service: Pulmonary;  Laterality: N/A;  . FOOT SURGERY    . KIDNEY STONE SURGERY  7 years  . TOTAL HIP ARTHROPLASTY Left 12/02/2018   Procedure: TOTAL HIP ARTHROPLASTY ANTERIOR APPROACH;  Surgeon: Sheral Apley, MD;  Location: [facility] ORS;  Service: Orthopedics;  Laterality: Left;  . TUBAL LIGATION      Family History  Problem Relation Age of Onset  . Hypertension Mother   . Arthritis Mother   . Hypertension Father   . Arthritis Father   . Prostate cancer Father   . Ovarian cancer Maternal Grandmother    Social History:  reports that she has been smoking cigarettes. She has a 7.00 pack-year  smoking history. She has never used smokeless tobacco. She reports current alcohol use. She reports that she does not use drugs.  Allergies:  Allergies  Allergen Reactions  . Bee Venom Anaphylaxis  . Ciprofloxacin Nausea Only  . Penicillins Rash and Other (See Comments)    Has patient had a PCN reaction causing immediate rash, facial/tongue/throat swelling, SOB or lightheadedness with hypotension: no Has patient had a PCN reaction causing severe rash involving mucus membranes or skin necrosis: no Has patient had a PCN reaction that required hospitalization no Has patient had a PCN reaction occurring within the last 10 years: {yes If all of the above answers are "NO", then may proceed with Cephalosporin use.     Medications:  I have reviewed the patient's current medications. Prior to Admission:  Medications Prior to Admission  Medication Sig Dispense Refill Last Dose  . albuterol (PROVENTIL HFA;VENTOLIN HFA) 108 (90 Base) MCG/ACT inhaler Inhale 2 puffs into the lungs every 4 (four) hours as needed for wheezing or shortness of breath. 1 Inhaler 5 Unknown at PRN  . aspirin EC 81 MG tablet Take 1 tablet (81 mg total) by mouth 2 (two) times daily. For DVT prophylaxis for 30 days after surgery. (Patient taking differently: Take 81 mg by mouth daily. ) 60 tablet  0 03/07/2019 at 0900  . fluticasone (FLONASE) 50 MCG/ACT nasal spray Place 2 sprays into both nostrils daily. 16 g 2 Unknown at Unknown  . furosemide (LASIX) 40 MG tablet Take 1 tablet (40 mg total) by mouth daily as needed for fluid. (Patient taking differently: Take 40 mg by mouth daily. ) 30 tablet 5 03/07/2019 at 0900  . sertraline (ZOLOFT) 100 MG tablet Take 2 tablets (200 mg total) by mouth daily. 60 tablet 5 03/07/2019 at 0900  . venlafaxine XR (EFFEXOR-XR) 75 MG 24 hr capsule Take 1 capsule (75 mg total) by mouth daily with breakfast. 30 capsule 5 03/07/2019 at 0900  . albuterol (PROVENTIL) (2.5 MG/3ML) 0.083% nebulizer solution Take  3 mLs (2.5 mg total) by nebulization every 6 (six) hours as needed for wheezing or shortness of breath. (Patient not taking: Reported on 03/07/2019) 75 mL 12 Completed Course at Unknown time  . docusate sodium (COLACE) 100 MG capsule Take 1 capsule (100 mg total) by mouth 2 (two) times daily. To prevent constipation while taking pain medication. (Patient not taking: Reported on 03/07/2019) 60 capsule 0 Completed Course at Unknown time  . famotidine (PEPCID) 20 MG tablet Take 1 tablet (20 mg total) by mouth 2 (two) times daily. For Gastroprotection 60 tablet 0   . gabapentin (NEURONTIN) 300 MG capsule Take 1 capsule (300 mg total) by mouth 3 (three) times daily for 14 days. For 2 weeks post op for pain. 42 capsule 0   . methocarbamol (ROBAXIN) 500 MG tablet Take 1 tablet (500 mg total) by mouth every 8 (eight) hours as needed for muscle spasms. (Patient not taking: Reported on 03/07/2019) 40 tablet 0 Completed Course at Unknown time  . neomycin-polymyxin-hydrocortisone (CORTISPORIN) 3.5-10000-1 OTIC suspension Place 4 drops into both ears 2 (two) times daily. (Patient not taking: Reported on 03/07/2019) 10 mL 0 Completed Course at Unknown time  . ondansetron (ZOFRAN) 4 MG tablet Take 1 tablet (4 mg total) by mouth every 8 (eight) hours as needed for nausea or vomiting. (Patient not taking: Reported on 03/07/2019) 20 tablet 0 Completed Course at Unknown time  . tamsulosin (FLOMAX) 0.4 MG CAPS capsule Take 1 capsule (0.4 mg total) by mouth daily. (Patient not taking: Reported on 03/07/2019) 30 capsule 0 Completed Course at Unknown time   Scheduled: . aspirin  300 mg Rectal Daily   Or  . aspirin EC  325 mg Oral Daily  . atorvastatin  40 mg Oral q1800  . docusate sodium  100 mg Oral BID  . enoxaparin (LOVENOX) injection  40 mg Subcutaneous Q24H  . famotidine  20 mg Oral BID  . fluticasone  2 spray Each Nare Daily  . insulin aspart  0-5 Units Subcutaneous QHS  . insulin aspart  0-9 Units Subcutaneous TID WC   . lacosamide  50 mg Oral BID  . mouth rinse  15 mL Mouth Rinse BID  . mometasone-formoterol  2 puff Inhalation BID  . potassium chloride  40 mEq Oral Once  . potassium chloride  40 mEq Oral Once  . tamsulosin  0.4 mg Oral Daily  . [START ON 03/10/2019] venlafaxine XR  75 mg Oral Q breakfast    ROS: History obtained from the patient  General ROS: fatigue Psychological ROS: depression Ophthalmic ROS: negative for - blurry vision, double vision, eye pain or loss of vision ENT ROS: negative for - epistaxis, nasal discharge, oral lesions, sore throat, tinnitus or vertigo Allergy and Immunology ROS: negative for - hives or itchy/watery eyes  Hematological and Lymphatic ROS: negative for - bleeding problems, bruising or swollen lymph nodes Endocrine ROS: negative for - galactorrhea, hair pattern changes, polydipsia/polyuria or temperature intolerance Respiratory ROS: negative for - cough, hemoptysis, shortness of breath or wheezing Cardiovascular ROS: chest pain Gastrointestinal ROS: negative for - abdominal pain, diarrhea, hematemesis, nausea/vomiting or stool incontinence Genito-Urinary ROS: negative for - dysuria, hematuria, incontinence or urinary frequency/urgency Musculoskeletal ROS: negative for - joint swelling or muscular weakness Neurological ROS: as noted in HPI Dermatological ROS: negative for rash and skin lesion changes  Physical Examination: Blood pressure (!) 103/51, pulse 64, temperature 98 F (36.7 C), temperature source Oral, resp. rate 19, height 5\' 7"  (1.702 m), weight 111.1 kg, SpO2 100 %.  HEENT-  Normocephalic, no lesions, without obvious abnormality.  Normal external eye and conjunctiva.  Normal TM's bilaterally.  Normal auditory canals and external ears. Normal external nose, mucus membranes and septum.  Normal pharynx. Cardiovascular- S1, S2 normal, pulses palpable throughout   Lungs- chest clear, no wheezing, rales, normal symmetric air entry Abdomen- soft,  non-tender; bowel sounds normal; no masses,  no organomegaly Extremities- no edema Lymph-no adenopathy palpable Musculoskeletal-no joint tenderness, deformity or swelling Skin-warm and dry, no hyperpigmentation, vitiligo, or suspicious lesions  Neurological Examination   Mental Status: Alert, oriented, thought content appropriate.  Speech fluent without evidence of aphasia.  Able to follow 3 step commands without difficulty. Cranial Nerves: II: Discs flat bilaterally; LHH, pupils equal, round, reactive to light and accommodation III,IV, VI: ptosis not present, extra-ocular motions intact bilaterally V,VII: right facial droop, facial light touch sensation decreased on the left VIII: hearing normal bilaterally IX,X: gag reflex present XI: bilateral shoulder shrug XII: midline tongue extension Motor: Right : Upper extremity   5/5    Left:     Upper extremity   3/5  Lower extremity   5-/5     Lower extremity   3/5 Tone and bulk:normal tone throughout; no atrophy noted Sensory: Pinprick and light touch decreased on the left Deep Tendon Reflexes: 2+ and symmetric with absent AJ's bilaterally Plantars: Right: mute   Left: mute Cerebellar: Normal finger-to-nose and normal heel-to-shin testing on the right.  Unable to perform on the left due to weakness Gait: not tested due to safety concerns  Laboratory Studies:  Basic Metabolic Panel: Recent Labs  Lab 03/07/19 1609 03/08/19 0357  NA 139 139  K 3.5 3.2*  CL 105 106  CO2 24 26  GLUCOSE 86 111*  BUN 11 9  CREATININE 0.61 0.73  CALCIUM 8.8* 8.3*    Liver Function Tests: Recent Labs  Lab 03/07/19 1609  AST 20  ALT 12  ALKPHOS 76  BILITOT 0.8  PROT 7.7  ALBUMIN 4.0   No results for input(s): LIPASE, AMYLASE in the last 168 hours. No results for input(s): AMMONIA in the last 168 hours.  CBC: Recent Labs  Lab 03/07/19 1609 03/08/19 0357  WBC 6.6 5.9  NEUTROABS 3.4  --   HGB 13.3 11.7*  HCT 40.0 36.2  MCV 89.9 90.3   PLT 300 278    Cardiac Enzymes: Recent Labs  Lab 03/07/19 1609 03/07/19 2148 03/08/19 0357 03/08/19 1016 03/09/19 1038  TROPONINI <0.03 <0.03 <0.03 <0.03 <0.03    BNP: Invalid input(s): POCBNP  CBG: Recent Labs  Lab 03/08/19 2006 03/08/19 2356 03/09/19 0427 03/09/19 0757 03/09/19 1225  GLUCAP 97 123* 95 96 87    Microbiology: Results for orders placed or performed during the hospital encounter of 11/25/18  Surgical pcr  screen     Status: None   Collection Time: 11/25/18  8:59 AM  Result Value Ref Range Status   MRSA, PCR NEGATIVE NEGATIVE Final   Staphylococcus aureus NEGATIVE NEGATIVE Final    Comment: (NOTE) The Xpert SA Assay (FDA approved for NASAL specimens in patients 29 years of age and older), is one component of a comprehensive surveillance program. It is not intended to diagnose infection nor to guide or monitor treatment. Performed at [facility], [address], [place], [place] [address]     Coagulation Studies: Recent Labs    03/07/19 1609  LABPROT 12.8  INR 1.0    Urinalysis:  Recent Labs  Lab 03/07/19 1609  COLORURINE STRAW*  LABSPEC 1.012  PHURINE 7.0  GLUCOSEU 50*  HGBUR SMALL*  BILIRUBINUR NEGATIVE  KETONESUR NEGATIVE  PROTEINUR NEGATIVE  NITRITE NEGATIVE  LEUKOCYTESUR SMALL*    Lipid Panel:    Component Value Date/Time   CHOL 164 03/08/2019 0357   CHOL 177 07/02/2018 1309   CHOL 194 11/29/2013 0504   TRIG 136 03/08/2019 0357   TRIG 61 11/29/2013 0504   HDL 36 (L) 03/08/2019 0357   HDL 51 07/02/2018 1309   HDL 43 11/29/2013 0504   CHOLHDL 4.6 03/08/2019 0357   VLDL 27 03/08/2019 0357   VLDL 12 11/29/2013 0504   LDLCALC 101 (H) 03/08/2019 0357   LDLCALC 113 (H) 07/02/2018 1309   LDLCALC 139 (H) 11/29/2013 0504    HgbA1C:  Lab Results  Component Value Date   HGBA1C 4.9 03/08/2019    Urine Drug Screen:      Component Value Date/Time   LABOPIA NONE DETECTED 03/07/2019 1609    COCAINSCRNUR NONE DETECTED 03/07/2019 1609   LABBENZ NONE DETECTED 03/07/2019 1609   AMPHETMU NONE DETECTED 03/07/2019 1609   THCU POSITIVE (A) 03/07/2019 1609   LABBARB NONE DETECTED 03/07/2019 1609    Alcohol Level:  Recent Labs  Lab 03/07/19 1609  ETH <10    Other results: EKG: sinus rhythm at 65 bpm.  Prolonged PR interval.    Imaging: Ct Angio Head W Or Wo Contrast  Result Date: 03/07/2019 CLINICAL DATA:  53 year old female with weakness after seizure. Facial droop. EXAM: CT ANGIOGRAPHY HEAD AND NECK CT PERFUSION BRAIN TECHNIQUE: Multidetector CT imaging of the head and neck was performed using the standard protocol during bolus administration of intravenous contrast. Multiplanar CT image reconstructions and MIPs were obtained to evaluate the vascular anatomy. Carotid stenosis measurements (when applicable) are obtained utilizing NASCET criteria, using the distal internal carotid diameter as the denominator. Multiphase CT imaging of the brain was performed following IV bolus contrast injection. Subsequent parametric perfusion maps were calculated using RAPID software. CONTRAST:  OMNIPAQUE IOHEXOL 350 MG/ML SOLN COMPARISON:  Head CT without contrast 1630 hours today. CTA chest abdomen and pelvis earlier today. FINDINGS: CT Brain Perfusion Findings: CBF (<30%) Volume: None. Perfusion (Tmax>6.0s) volume: None. Mismatch Volume: Not applicable Infarction Location:Not applicable CTA NECK Skeleton: No acute osseous abnormality identified. Lower cervical spine disc and endplate degeneration. Upper chest: Mild dependent atelectasis. Other neck: Mild multinodular thyroid goiter. Aortic arch: Mildly bovine arch configuration. No arch atherosclerosis. Right carotid system: Mildly tortuous brachiocephalic and proximal right CCA. Negative right carotid bifurcation. Mildly tortuous cervical right ICA. Left carotid system: Negative left CCA and left carotid bifurcation. Mildly tortuous cervical left  ICA. Vertebral arteries: Proximal right subclavian artery and right vertebral artery origins appear normal. The right vertebral artery is patent to the skull base  with no stenosis identified. Proximal left subclavian artery and left vertebral artery origins appear normal. The left vertebral artery is patent to the skull base with no stenosis identified. CTA HEAD Posterior circulation: Codominant and normal distal vertebral arteries. Patent PICA origins and vertebrobasilar junction. Patent basilar artery without stenosis. Normal SCA and left PCA origins. Fetal type right PCA origin. Bilateral PCA branches are within normal limits. Anterior circulation: Both ICA siphons are patent. On the left there is no plaque or stenosis. Normal left ophthalmic artery origin. On the right there is no plaque or stenosis. Normal right ophthalmic and posterior communicating artery origins. Patent carotid termini. Normal MCA and ACA origins. Diminutive or absent anterior communicating artery. Bilateral ACA branches are within normal limits. Left MCA M1 segment and bifurcation are patent without stenosis. Left MCA branches are within normal limits. Right MCA M1 segment and bifurcation are patent without stenosis. Right MCA branches are within normal limits. Venous sinuses: Patent. Anatomic variants: Mildly bovine arch configuration. Fetal type right PCA origin. Delayed phase: No abnormal enhancement identified. Stable gray-white matter differentiation throughout the brain. Review of the MIP images confirms the above findings IMPRESSION: 1. Negative for large vessel occlusion, and CT Perfusion detects no core infarct or ischemic penumbra. 2. No atherosclerosis or arterial stenosis identified in the head or neck. 3. Stable and negative CT appearance of the brain since 1630 hours today. 4. Mild thyroid goiter. Electronically Signed   By: Odessa Fleming M.D.   On: 03/07/2019 18:50   Dg Chest 2 View  Result Date: 03/08/2019 CLINICAL DATA:   Shortness of breath, sarcoid EXAM: CHEST - 2 VIEW COMPARISON:  CTA chest dated 03/07/2019 FINDINGS: Mild linear scarring/atelectasis in the lingula and right lower lobe. No focal consolidation. No pleural effusion or pneumothorax. The heart is normal in size. Degenerative changes of the visualized thoracolumbar spine. IMPRESSION: No evidence of acute cardiopulmonary disease. Electronically Signed   By: Charline Bills M.D.   On: 03/08/2019 09:39   Ct Head Wo Contrast  Result Date: 03/07/2019 CLINICAL DATA:  Altered mental status/unresponsive. Questionable facial droop EXAM: CT HEAD WITHOUT CONTRAST TECHNIQUE: Contiguous axial images were obtained from the base of the skull through the vertex without intravenous contrast. COMPARISON:  October 07, 2017 FINDINGS: Brain: The ventricles are not in size and configuration. There is no intracranial mass, hemorrhage, extra-axial fluid collection, or midline shift. The brain parenchyma appears unremarkable. No acute infarct evident. Vascular: There is no appreciable hyperdense vessel. There is no appreciable vascular calcification. Skull: Bony calvarium appears intact. Sinuses/Orbits: There is mucosal thickening in several ethmoid air cells as well as in portions of each sphenoid sinus. Visualized orbits appear symmetric bilaterally. Other: Visualized mastoid air cells are clear. IMPRESSION: Brain parenchyma appears unremarkable. No mass or hemorrhage. Foci of paranasal sinus disease noted. Electronically Signed   By: Bretta Bang III M.D.   On: 03/07/2019 16:53   Ct Angio Neck W And/or Wo Contrast  Result Date: 03/07/2019 CLINICAL DATA:  53 year old female with weakness after seizure. Facial droop. EXAM: CT ANGIOGRAPHY HEAD AND NECK CT PERFUSION BRAIN TECHNIQUE: Multidetector CT imaging of the head and neck was performed using the standard protocol during bolus administration of intravenous contrast. Multiplanar CT image reconstructions and MIPs were obtained  to evaluate the vascular anatomy. Carotid stenosis measurements (when applicable) are obtained utilizing NASCET criteria, using the distal internal carotid diameter as the denominator. Multiphase CT imaging of the brain was performed following IV bolus contrast injection. Subsequent parametric perfusion maps  were calculated using RAPID software. CONTRAST:  100mL OMNIPAQUE IOHEXOL 350 MG/ML SOLN COMPARISON:  Head CT without contrast 1630 hours today. CTA chest abdomen and pelvis earlier today. FINDINGS: CT Brain Perfusion Findings: CBF (<30%) Volume: None. Perfusion (Tmax>6.0s) volume: None. Mismatch Volume: Not applicable Infarction Location:Not applicable CTA NECK Skeleton: No acute osseous abnormality identified. Lower cervical spine disc and endplate degeneration. Upper chest: Mild dependent atelectasis. Other neck: Mild multinodular thyroid goiter. Aortic arch: Mildly bovine arch configuration. No arch atherosclerosis. Right carotid system: Mildly tortuous brachiocephalic and proximal right CCA. Negative right carotid bifurcation. Mildly tortuous cervical right ICA. Left carotid system: Negative left CCA and left carotid bifurcation. Mildly tortuous cervical left ICA. Vertebral arteries: Proximal right subclavian artery and right vertebral artery origins appear normal. The right vertebral artery is patent to the skull base with no stenosis identified. Proximal left subclavian artery and left vertebral artery origins appear normal. The left vertebral artery is patent to the skull base with no stenosis identified. CTA HEAD Posterior circulation: Codominant and normal distal vertebral arteries. Patent PICA origins and vertebrobasilar junction. Patent basilar artery without stenosis. Normal SCA and left PCA origins. Fetal type right PCA origin. Bilateral PCA branches are within normal limits. Anterior circulation: Both ICA siphons are patent. On the left there is no plaque or stenosis. Normal left ophthalmic artery  origin. On the right there is no plaque or stenosis. Normal right ophthalmic and posterior communicating artery origins. Patent carotid termini. Normal MCA and ACA origins. Diminutive or absent anterior communicating artery. Bilateral ACA branches are within normal limits. Left MCA M1 segment and bifurcation are patent without stenosis. Left MCA branches are within normal limits. Right MCA M1 segment and bifurcation are patent without stenosis. Right MCA branches are within normal limits. Venous sinuses: Patent. Anatomic variants: Mildly bovine arch configuration. Fetal type right PCA origin. Delayed phase: No abnormal enhancement identified. Stable gray-white matter differentiation throughout the brain. Review of the MIP images confirms the above findings IMPRESSION: 1. Negative for large vessel occlusion, and CT Perfusion detects no core infarct or ischemic penumbra. 2. No atherosclerosis or arterial stenosis identified in the head or neck. 3. Stable and negative CT appearance of the brain since 1630 hours today. 4. Mild thyroid goiter. Electronically Signed   By: Odessa FlemingH  Hall M.D.   On: 03/07/2019 18:50   Mr Brain Wo Contrast  Result Date: 03/08/2019 CLINICAL DATA:  Left-sided tingling. Possible seizure. Negative CT evaluation yesterday. EXAM: MRI HEAD WITHOUT CONTRAST MRA HEAD WITHOUT CONTRAST TECHNIQUE: Multiplanar, multiecho pulse sequences of the brain and surrounding structures were obtained without intravenous contrast. Angiographic images of the head were obtained using MRA technique without contrast. COMPARISON:  CT studies 03/07/2019 FINDINGS: MRI HEAD FINDINGS Brain: Diffusion imaging does not show any acute or subacute infarction. The brainstem and cerebellum are normal. There are scattered punctate foci of T2 and FLAIR signal in the frontal white matter, likely indicating the earliest manifestation of small vessel change. No cortical or large vessel territory infarction. No evidence of mass lesion,  hemorrhage, hydrocephalus or extra-axial collection. Vascular: Major vessels at the base of the brain show flow. Skull and upper cervical spine: Negative Sinuses/Orbits: Clear/normal Other: None MRA HEAD FINDINGS Both internal carotid arteries are widely patent into the brain. No siphon stenosis. The anterior and middle cerebral vessels are patent without proximal stenosis, aneurysm or vascular malformation. Both vertebral arteries are widely patent to the basilar. No basilar stenosis. Posterior circulation branch vessels appear normal. IMPRESSION: No acute finding. Normal  except for scattered punctate foci of T2 and FLAIR signal in the frontal white matter likely indicating the earliest manifestation of small vessel change. Normal intracranial MR angiography of the large and medium size vessels per Electronically Signed   By: Paulina Fusi M.D.   On: 03/08/2019 08:52   Ct Cerebral Perfusion W Contrast  Result Date: 03/07/2019 CLINICAL DATA:  53 year old female with weakness after seizure. Facial droop. EXAM: CT ANGIOGRAPHY HEAD AND NECK CT PERFUSION BRAIN TECHNIQUE: Multidetector CT imaging of the head and neck was performed using the standard protocol during bolus administration of intravenous contrast. Multiplanar CT image reconstructions and MIPs were obtained to evaluate the vascular anatomy. Carotid stenosis measurements (when applicable) are obtained utilizing NASCET criteria, using the distal internal carotid diameter as the denominator. Multiphase CT imaging of the brain was performed following IV bolus contrast injection. Subsequent parametric perfusion maps were calculated using RAPID software. CONTRAST:  OMNIPAQUE IOHEXOL 350 MG/ML SOLN COMPARISON:  Head CT without contrast 1630 hours today. CTA chest abdomen and pelvis earlier today. FINDINGS: CT Brain Perfusion Findings: CBF (<30%) Volume: None. Perfusion (Tmax>6.0s) volume: None. Mismatch Volume: Not applicable Infarction Location:Not  applicable CTA NECK Skeleton: No acute osseous abnormality identified. Lower cervical spine disc and endplate degeneration. Upper chest: Mild dependent atelectasis. Other neck: Mild multinodular thyroid goiter. Aortic arch: Mildly bovine arch configuration. No arch atherosclerosis. Right carotid system: Mildly tortuous brachiocephalic and proximal right CCA. Negative right carotid bifurcation. Mildly tortuous cervical right ICA. Left carotid system: Negative left CCA and left carotid bifurcation. Mildly tortuous cervical left ICA. Vertebral arteries: Proximal right subclavian artery and right vertebral artery origins appear normal. The right vertebral artery is patent to the skull base with no stenosis identified. Proximal left subclavian artery and left vertebral artery origins appear normal. The left vertebral artery is patent to the skull base with no stenosis identified. CTA HEAD Posterior circulation: Codominant and normal distal vertebral arteries. Patent PICA origins and vertebrobasilar junction. Patent basilar artery without stenosis. Normal SCA and left PCA origins. Fetal type right PCA origin. Bilateral PCA branches are within normal limits. Anterior circulation: Both ICA siphons are patent. On the left there is no plaque or stenosis. Normal left ophthalmic artery origin. On the right there is no plaque or stenosis. Normal right ophthalmic and posterior communicating artery origins. Patent carotid termini. Normal MCA and ACA origins. Diminutive or absent anterior communicating artery. Bilateral ACA branches are within normal limits. Left MCA M1 segment and bifurcation are patent without stenosis. Left MCA branches are within normal limits. Right MCA M1 segment and bifurcation are patent without stenosis. Right MCA branches are within normal limits. Venous sinuses: Patent. Anatomic variants: Mildly bovine arch configuration. Fetal type right PCA origin. Delayed phase: No abnormal enhancement identified.  Stable gray-white matter differentiation throughout the brain. Review of the MIP images confirms the above findings IMPRESSION: 1. Negative for large vessel occlusion, and CT Perfusion detects no core infarct or ischemic penumbra. 2. No atherosclerosis or arterial stenosis identified in the head or neck. 3. Stable and negative CT appearance of the brain since 1630 hours today. 4. Mild thyroid goiter. Electronically Signed   By: Odessa Fleming M.D.   On: 03/07/2019 18:50   Mr Maxine Glenn Head/brain ZO Cm  Result Date: 03/08/2019 CLINICAL DATA:  Left-sided tingling. Possible seizure. Negative CT evaluation yesterday. EXAM: MRI HEAD WITHOUT CONTRAST MRA HEAD WITHOUT CONTRAST TECHNIQUE: Multiplanar, multiecho pulse sequences of the brain and surrounding structures were obtained without intravenous contrast. Angiographic images  of the head were obtained using MRA technique without contrast. COMPARISON:  CT studies 03/07/2019 FINDINGS: MRI HEAD FINDINGS Brain: Diffusion imaging does not show any acute or subacute infarction. The brainstem and cerebellum are normal. There are scattered punctate foci of T2 and FLAIR signal in the frontal white matter, likely indicating the earliest manifestation of small vessel change. No cortical or large vessel territory infarction. No evidence of mass lesion, hemorrhage, hydrocephalus or extra-axial collection. Vascular: Major vessels at the base of the brain show flow. Skull and upper cervical spine: Negative Sinuses/Orbits: Clear/normal Other: None MRA HEAD FINDINGS Both internal carotid arteries are widely patent into the brain. No siphon stenosis. The anterior and middle cerebral vessels are patent without proximal stenosis, aneurysm or vascular malformation. Both vertebral arteries are widely patent to the basilar. No basilar stenosis. Posterior circulation branch vessels appear normal. IMPRESSION: No acute finding. Normal except for scattered punctate foci of T2 and FLAIR signal in the  frontal white matter likely indicating the earliest manifestation of small vessel change. Normal intracranial MR angiography of the large and medium size vessels per Electronically Signed   By: Paulina Fusi M.D.   On: 03/08/2019 08:52   Ct Angio Chest/abd/pel For Dissection W And/or Wo Contrast  Result Date: 03/07/2019 CLINICAL DATA:  Chest pain.  Unresponsive. EXAM: CT ANGIOGRAPHY CHEST, ABDOMEN AND PELVIS TECHNIQUE: Multidetector CT imaging through the chest, abdomen and pelvis was performed using the standard protocol during bolus administration of intravenous contrast. Multiplanar reconstructed images and MIPs were obtained and reviewed to evaluate the vascular anatomy. CONTRAST:  ISOVUE-370 IOPAMIDOL (ISOVUE-370) INJECTION 76% COMPARISON:  CT abdomen and pelvis 11/13/2018 FINDINGS: CTA CHEST FINDINGS Cardiovascular: Heart is normal size. Aorta is normal caliber. No dissection. Mediastinum/Nodes: No mediastinal, hilar, or axillary adenopathy. Lungs/Pleura: Dependent atelectasis in the lungs.  No effusions. Musculoskeletal: No acute bony abnormality. Review of the MIP images confirms the above findings. CTA ABDOMEN AND PELVIS FINDINGS VASCULAR Aorta: Normal caliber.  No aneurysm or dissection. Celiac: Widely patent SMA: Widely patent Renals: Single bilaterally, widely patent IMA: Widely patent Inflow: Patent.  No aneurysm or dissection. Veins: Grossly patent. Review of the MIP images confirms the above findings. NON-VASCULAR Hepatobiliary: Insert paddle biliary Pancreas: No focal abnormality or ductal dilatation. Spleen: No focal abnormality.  Normal size. Adrenals/Urinary Tract: No adrenal abnormality. No focal renal abnormality. No stones or hydronephrosis. Urinary bladder is unremarkable. Stomach/Bowel: Normal appendix. Stomach, large and small bowel grossly unremarkable. Lymphatic: Shotty retroperitoneal lymph nodes.  No adenopathy. Reproductive: Prior hysterectomy.  No adnexal masses. Other: No  free fluid or free air. Musculoskeletal: No acute bony abnormality. Prior left hip replacement. Degenerative changes in the lumbar spine. Review of the MIP images confirms the above findings. IMPRESSION: No evidence of aortic aneurysm or dissection. Bibasilar atelectasis.  No acute cardiopulmonary disease. No acute findings in the abdomen or pelvis. Electronically Signed   By: Charlett Nose M.D.   On: 03/07/2019 16:56    Assessment/Plan: 53 y.o. female presenting with seizure-like activity and left sided weakness.  Patient with multiple previous similar events.  Work ups in the past have been unrevealing.   - Pt is on Vimpat. I am not convinced these are true epileptic seizures - She did see Dr. Sherryll Burger in the past and was discharged from practice. I would encourage for her to follow up with him.   - d/c planning form neuro stand [facility] on Vimpat  03/09/2019, 1:59 PM

## 2019-03-09 NOTE — Plan of Care (Signed)
  Problem: Education: Goal: Knowledge of disease or condition will improve Outcome: Progressing   Problem: Coping: Goal: Will identify appropriate support needs Outcome: Progressing   Problem: Education: Goal: Knowledge of secondary prevention will improve Outcome: Progressing   Problem: Education: Goal: Knowledge of General Education information will improve Description Including pain rating scale, medication(s)/side effects and non-pharmacologic comfort measures Outcome: Progressing   Problem: Clinical Measurements: Goal: Ability to maintain clinical measurements within normal limits will improve Outcome: Progressing

## 2019-03-09 NOTE — Progress Notes (Signed)
[facility]   PATIENT NAME: Nicole Wyatt    MR#:  [id number]  DATE OF BIRTH:  Oct 25, 1966  SUBJECTIVE:  CHIEF COMPLAINT:   Chief Complaint  Patient presents with  . Altered Mental Status  . Chest Pain   Came with mixed complaint of chest pain, numbness, left-sided weakness, tingling.  Complaints are resolved.  She also had some shaking/seizures with EMS.   Feels very weak and could not get up safely with physical therapy.  REVIEW OF SYSTEMS:  CONSTITUTIONAL: No fever, have fatigue or weakness.  EYES: No blurred or double vision.  EARS, NOSE, AND THROAT: No tinnitus or ear pain.  RESPIRATORY: No cough, shortness of breath, wheezing or hemoptysis.  CARDIOVASCULAR: No chest pain, orthopnea, edema.  GASTROINTESTINAL: No nausea, vomiting, diarrhea or abdominal pain.  GENITOURINARY: No dysuria, hematuria.  ENDOCRINE: No polyuria, nocturia,  HEMATOLOGY: No anemia, easy bruising or bleeding SKIN: No rash or lesion. MUSCULOSKELETAL: No joint pain or arthritis.   NEUROLOGIC: No tingling, numbness, weakness.  PSYCHIATRY: No anxiety or depression.   ROS  DRUG ALLERGIES:   Allergies  Allergen Reactions  . Bee Venom Anaphylaxis  . Ciprofloxacin Nausea Only  . Penicillins Rash and Other (See Comments)    Has patient had a PCN reaction causing immediate rash, facial/tongue/throat swelling, SOB or lightheadedness with hypotension: no Has patient had a PCN reaction causing severe rash involving mucus membranes or skin necrosis: no Has patient had a PCN reaction that required hospitalization no Has patient had a PCN reaction occurring within the last 10 years: {yes If all of the above answers are "NO", then may proceed with Cephalosporin use.     VITALS:  Blood pressure (!) 103/51, pulse 64, temperature 98 F (36.7 C), temperature source Oral, resp. rate 19, height  (1.702 m), weight 111.1 kg, SpO2 100 %.  PHYSICAL EXAMINATION:  GENERAL:   53 y.o.-year-old patient lying in the bed with no acute distress.  EYES: Pupils equal, round, reactive to light and accommodation. No scleral icterus. Extraocular muscles intact.  HEENT: Head atraumatic, normocephalic. Oropharynx and nasopharynx clear.  NECK:  Supple, no jugular venous distention. No thyroid enlargement, no tenderness.  LUNGS: Normal breath sounds bilaterally, no wheezing, rales,rhonchi or crepitation. No use of accessory muscles of respiration.  CARDIOVASCULAR: S1, S2 normal. No murmurs, rubs, or gallops.  ABDOMEN: Soft, nontender, nondistended. Bowel sounds present. No organomegaly or mass.  EXTREMITIES: No pedal edema, cyanosis, or clubbing.  NEUROLOGIC: Cranial nerves II through XII are intact. Muscle strength 3-4/5 in all extremities. Sensation intact. Gait not checked.  PSYCHIATRIC: The patient is alert and oriented x 3.  Appears depressed. SKIN: No obvious rash, lesion, or ulcer.   Physical Exam LABORATORY PANEL:   CBC Recent Labs  Lab 03/08/19 0357  WBC 5.9  HGB 11.7*  HCT 36.2  PLT 278   ------------------------------------------------------------------------------------------------------------------  Chemistries  Recent Labs  Lab 03/07/19 1609 03/08/19 0357  NA 139 139  K 3.5 3.2*  CL 105 106  CO2 24 26  GLUCOSE 86 111*  BUN 11 9  CREATININE 0.61 0.73  CALCIUM 8.8* 8.3*  AST 20  --   ALT 12  --   ALKPHOS 76  --   BILITOT 0.8  --    ------------------------------------------------------------------------------------------------------------------  Cardiac Enzymes Recent Labs  Lab 03/08/19 1016 03/09/19 1038  TROPONINI <0.03 <0.03   ------------------------------------------------------------------------------------------------------------------  RADIOLOGY:  Ct Angio Head W Or Wo Contrast  Result Date: 03/07/2019 CLINICAL DATA:  53 year old female with weakness after seizure. Facial droop. EXAM: CT ANGIOGRAPHY HEAD AND NECK CT  PERFUSION BRAIN TECHNIQUE: Multidetector CT imaging of the head and neck was performed using the standard protocol during bolus administration of intravenous contrast. Multiplanar CT image reconstructions and MIPs were obtained to evaluate the vascular anatomy. Carotid stenosis measurements (when applicable) are obtained utilizing NASCET criteria, using the distal internal carotid diameter as the denominator. Multiphase CT imaging of the brain was performed following IV bolus contrast injection. Subsequent parametric perfusion maps were calculated using RAPID software. CONTRAST:  OMNIPAQUE IOHEXOL 350 MG/ML SOLN COMPARISON:  Head CT without contrast 1630 hours today. CTA chest abdomen and pelvis earlier today. FINDINGS: CT Brain Perfusion Findings: CBF (<30%) Volume: None. Perfusion (Tmax>6.0s) volume: None. Mismatch Volume: Not applicable Infarction Location:Not applicable CTA NECK Skeleton: No acute osseous abnormality identified. Lower cervical spine disc and endplate degeneration. Upper chest: Mild dependent atelectasis. Other neck: Mild multinodular thyroid goiter. Aortic arch: Mildly bovine arch configuration. No arch atherosclerosis. Right carotid system: Mildly tortuous brachiocephalic and proximal right CCA. Negative right carotid bifurcation. Mildly tortuous cervical right ICA. Left carotid system: Negative left CCA and left carotid bifurcation. Mildly tortuous cervical left ICA. Vertebral arteries: Proximal right subclavian artery and right vertebral artery origins appear normal. The right vertebral artery is patent to the skull base with no stenosis identified. Proximal left subclavian artery and left vertebral artery origins appear normal. The left vertebral artery is patent to the skull base with no stenosis identified. CTA HEAD Posterior circulation: Codominant and normal distal vertebral arteries. Patent PICA origins and vertebrobasilar junction. Patent basilar artery without stenosis. Normal  SCA and left PCA origins. Fetal type right PCA origin. Bilateral PCA branches are within normal limits. Anterior circulation: Both ICA siphons are patent. On the left there is no plaque or stenosis. Normal left ophthalmic artery origin. On the right there is no plaque or stenosis. Normal right ophthalmic and posterior communicating artery origins. Patent carotid termini. Normal MCA and ACA origins. Diminutive or absent anterior communicating artery. Bilateral ACA branches are within normal limits. Left MCA M1 segment and bifurcation are patent without stenosis. Left MCA branches are within normal limits. Right MCA M1 segment and bifurcation are patent without stenosis. Right MCA branches are within normal limits. Venous sinuses: Patent. Anatomic variants: Mildly bovine arch configuration. Fetal type right PCA origin. Delayed phase: No abnormal enhancement identified. Stable gray-white matter differentiation throughout the brain. Review of the MIP images confirms the above findings IMPRESSION: 1. Negative for large vessel occlusion, and CT Perfusion detects no core infarct or ischemic penumbra. 2. No atherosclerosis or arterial stenosis identified in the head or neck. 3. Stable and negative CT appearance of the brain since 1630 hours today. 4. Mild thyroid goiter. Electronically Signed   By: Odessa Fleming M.D.   On: 03/07/2019 18:50   Dg Chest 2 View  Result Date: 03/08/2019 CLINICAL DATA:  Shortness of breath, sarcoid EXAM: CHEST - 2 VIEW COMPARISON:  CTA chest dated 03/07/2019 FINDINGS: Mild linear scarring/atelectasis in the lingula and right lower lobe. No focal consolidation. No pleural effusion or pneumothorax. The heart is normal in size. Degenerative changes of the visualized thoracolumbar spine. IMPRESSION: No evidence of acute cardiopulmonary disease. Electronically Signed   By: Charline Bills M.D.   On: 03/08/2019 09:39   Ct Head Wo Contrast  Result Date: 03/07/2019 CLINICAL DATA:  Altered mental  status/unresponsive. Questionable facial droop EXAM: CT HEAD WITHOUT CONTRAST TECHNIQUE: Contiguous axial images were obtained from  the base of the skull through the vertex without intravenous contrast. COMPARISON:  October 07, 2017 FINDINGS: Brain: The ventricles are not in size and configuration. There is no intracranial mass, hemorrhage, extra-axial fluid collection, or midline shift. The brain parenchyma appears unremarkable. No acute infarct evident. Vascular: There is no appreciable hyperdense vessel. There is no appreciable vascular calcification. Skull: Bony calvarium appears intact. Sinuses/Orbits: There is mucosal thickening in several ethmoid air cells as well as in portions of each sphenoid sinus. Visualized orbits appear symmetric bilaterally. Other: Visualized mastoid air cells are clear. IMPRESSION: Brain parenchyma appears unremarkable. No mass or hemorrhage. Foci of paranasal sinus disease noted. Electronically Signed   By: Bretta Bang III M.D.   On: 03/07/2019 16:53   Ct Angio Neck W And/or Wo Contrast  Result Date: 03/07/2019 CLINICAL DATA:  53 year old female with weakness after seizure. Facial droop. EXAM: CT ANGIOGRAPHY HEAD AND NECK CT PERFUSION BRAIN TECHNIQUE: Multidetector CT imaging of the head and neck was performed using the standard protocol during bolus administration of intravenous contrast. Multiplanar CT image reconstructions and MIPs were obtained to evaluate the vascular anatomy. Carotid stenosis measurements (when applicable) are obtained utilizing NASCET criteria, using the distal internal carotid diameter as the denominator. Multiphase CT imaging of the brain was performed following IV bolus contrast injection. Subsequent parametric perfusion maps were calculated using RAPID software. CONTRAST:  OMNIPAQUE IOHEXOL 350 MG/ML SOLN COMPARISON:  Head CT without contrast 1630 hours today. CTA chest abdomen and pelvis earlier today. FINDINGS: CT Brain Perfusion  Findings: CBF (<30%) Volume: None. Perfusion (Tmax>6.0s) volume: None. Mismatch Volume: Not applicable Infarction Location:Not applicable CTA NECK Skeleton: No acute osseous abnormality identified. Lower cervical spine disc and endplate degeneration. Upper chest: Mild dependent atelectasis. Other neck: Mild multinodular thyroid goiter. Aortic arch: Mildly bovine arch configuration. No arch atherosclerosis. Right carotid system: Mildly tortuous brachiocephalic and proximal right CCA. Negative right carotid bifurcation. Mildly tortuous cervical right ICA. Left carotid system: Negative left CCA and left carotid bifurcation. Mildly tortuous cervical left ICA. Vertebral arteries: Proximal right subclavian artery and right vertebral artery origins appear normal. The right vertebral artery is patent to the skull base with no stenosis identified. Proximal left subclavian artery and left vertebral artery origins appear normal. The left vertebral artery is patent to the skull base with no stenosis identified. CTA HEAD Posterior circulation: Codominant and normal distal vertebral arteries. Patent PICA origins and vertebrobasilar junction. Patent basilar artery without stenosis. Normal SCA and left PCA origins. Fetal type right PCA origin. Bilateral PCA branches are within normal limits. Anterior circulation: Both ICA siphons are patent. On the left there is no plaque or stenosis. Normal left ophthalmic artery origin. On the right there is no plaque or stenosis. Normal right ophthalmic and posterior communicating artery origins. Patent carotid termini. Normal MCA and ACA origins. Diminutive or absent anterior communicating artery. Bilateral ACA branches are within normal limits. Left MCA M1 segment and bifurcation are patent without stenosis. Left MCA branches are within normal limits. Right MCA M1 segment and bifurcation are patent without stenosis. Right MCA branches are within normal limits. Venous sinuses: Patent. Anatomic  variants: Mildly bovine arch configuration. Fetal type right PCA origin. Delayed phase: No abnormal enhancement identified. Stable gray-white matter differentiation throughout the brain. Review of the MIP images confirms the above findings IMPRESSION: 1. Negative for large vessel occlusion, and CT Perfusion detects no core infarct or ischemic penumbra. 2. No atherosclerosis or arterial stenosis identified in the head or neck. 3. Stable  and negative CT appearance of the brain since 1630 hours today. 4. Mild thyroid goiter. Electronically Signed   By: Odessa Fleming M.D.   On: 03/07/2019 18:50   Ct Lumbar Spine Wo Contrast  Result Date: 03/09/2019 CLINICAL DATA:  Initial evaluation for left-sided muscle weakness. EXAM: CT LUMBAR SPINE WITHOUT CONTRAST TECHNIQUE: Multidetector CT imaging of the lumbar spine was performed without intravenous contrast administration. Multiplanar CT image reconstructions were also generated. COMPARISON:  Prior radiograph from 11/30/2013. FINDINGS: Segmentation: Standard. Lowest well-formed disc labeled the L5-S1 level. Alignment: Trace anterolisthesis of L5 on S1, likely chronic and facet mediated. Alignment otherwise normal with preservation of the normal lumbar lordosis. No malalignment. Vertebrae: Vertebral body heights maintained without evidence for acute or chronic fracture. Visualized sacrum and pelvis intact. SI joints approximated symmetric. No discrete osseous lesions. Paraspinal and other soft tissues: Paraspinous soft tissues demonstrate no acute finding. Visualized visceral structures and intra-abdominal contents unremarkable. Mild for age atherosclerotic change. Disc levels: T11-12: Normal interspace. Right-sided posterior element hypertrophy. No significant stenosis. T12-L1: Unremarkable. L1-2:  Unremarkable. L2-3: Normal interspace. Mild bilateral facet hypertrophy. No canal or foraminal stenosis. L3-4: Mild annular disc bulge. Moderate facet and ligament flavum hypertrophy.  No significant canal or foraminal stenosis. L4-5: Right eccentric disc bulge with superimposed right foraminal disc protrusion (series 5, image 86). Moderate facet and ligament flavum hypertrophy. Resultant moderate canal with right greater than left lateral recess stenosis. Mild to moderate bilateral foraminal narrowing. L5-S1: Trace anterolisthesis. Diffuse disc bulge with intervertebral disc space narrowing. Disc bulging asymmetric to the right with superimposed right subarticular disc protrusion (series 5, image 99). Advanced bilateral facet hypertrophy. Resultant moderate to severe bilateral subarticular stenosis with moderate bilateral foraminal narrowing. Central canal remains patent. IMPRESSION: 1. Disc bulging with superimposed right subarticular disc protrusion and advanced facet hypertrophy at L5-S1, resulting in moderate to severe bilateral subarticular stenosis with moderate bilateral L5 foraminal narrowing. Either of the L5 or descending S1 nerve roots could be affected. 2. Disc bulging with superimposed right foraminal disc protrusion at L4-5 with resultant moderate canal with right greater than left lateral recess stenosis, with mild to moderate bilateral L4 foraminal narrowing. 3. No acute abnormality within the lumbar spine. Electronically Signed   By: Rise Mu M.D.   On: 03/09/2019 14:53   Mr Brain Wo Contrast  Result Date: 03/08/2019 CLINICAL DATA:  Left-sided tingling. Possible seizure. Negative CT evaluation yesterday. EXAM: MRI HEAD WITHOUT CONTRAST MRA HEAD WITHOUT CONTRAST TECHNIQUE: Multiplanar, multiecho pulse sequences of the brain and surrounding structures were obtained without intravenous contrast. Angiographic images of the head were obtained using MRA technique without contrast. COMPARISON:  CT studies 03/07/2019 FINDINGS: MRI HEAD FINDINGS Brain: Diffusion imaging does not show any acute or subacute infarction. The brainstem and cerebellum are normal. There are  scattered punctate foci of T2 and FLAIR signal in the frontal white matter, likely indicating the earliest manifestation of small vessel change. No cortical or large vessel territory infarction. No evidence of mass lesion, hemorrhage, hydrocephalus or extra-axial collection. Vascular: Major vessels at the base of the brain show flow. Skull and upper cervical spine: Negative Sinuses/Orbits: Clear/normal Other: None MRA HEAD FINDINGS Both internal carotid arteries are widely patent into the brain. No siphon stenosis. The anterior and middle cerebral vessels are patent without proximal stenosis, aneurysm or vascular malformation. Both vertebral arteries are widely patent to the basilar. No basilar stenosis. Posterior circulation branch vessels appear normal. IMPRESSION: No acute finding. Normal except for scattered punctate foci of T2  and FLAIR signal in the frontal white matter likely indicating the earliest manifestation of small vessel change. Normal intracranial MR angiography of the large and medium size vessels per Electronically Signed   By: Paulina Fusi M.D.   On: 03/08/2019 08:52   Ct Cerebral Perfusion W Contrast  Result Date: 03/07/2019 CLINICAL DATA:  53 year old female with weakness after seizure. Facial droop. EXAM: CT ANGIOGRAPHY HEAD AND NECK CT PERFUSION BRAIN TECHNIQUE: Multidetector CT imaging of the head and neck was performed using the standard protocol during bolus administration of intravenous contrast. Multiplanar CT image reconstructions and MIPs were obtained to evaluate the vascular anatomy. Carotid stenosis measurements (when applicable) are obtained utilizing NASCET criteria, using the distal internal carotid diameter as the denominator. Multiphase CT imaging of the brain was performed following IV bolus contrast injection. Subsequent parametric perfusion maps were calculated using RAPID software. CONTRAST:  OMNIPAQUE IOHEXOL 350 MG/ML SOLN COMPARISON:  Head CT without contrast  1630 hours today. CTA chest abdomen and pelvis earlier today. FINDINGS: CT Brain Perfusion Findings: CBF (<30%) Volume: None. Perfusion (Tmax>6.0s) volume: None. Mismatch Volume: Not applicable Infarction Location:Not applicable CTA NECK Skeleton: No acute osseous abnormality identified. Lower cervical spine disc and endplate degeneration. Upper chest: Mild dependent atelectasis. Other neck: Mild multinodular thyroid goiter. Aortic arch: Mildly bovine arch configuration. No arch atherosclerosis. Right carotid system: Mildly tortuous brachiocephalic and proximal right CCA. Negative right carotid bifurcation. Mildly tortuous cervical right ICA. Left carotid system: Negative left CCA and left carotid bifurcation. Mildly tortuous cervical left ICA. Vertebral arteries: Proximal right subclavian artery and right vertebral artery origins appear normal. The right vertebral artery is patent to the skull base with no stenosis identified. Proximal left subclavian artery and left vertebral artery origins appear normal. The left vertebral artery is patent to the skull base with no stenosis identified. CTA HEAD Posterior circulation: Codominant and normal distal vertebral arteries. Patent PICA origins and vertebrobasilar junction. Patent basilar artery without stenosis. Normal SCA and left PCA origins. Fetal type right PCA origin. Bilateral PCA branches are within normal limits. Anterior circulation: Both ICA siphons are patent. On the left there is no plaque or stenosis. Normal left ophthalmic artery origin. On the right there is no plaque or stenosis. Normal right ophthalmic and posterior communicating artery origins. Patent carotid termini. Normal MCA and ACA origins. Diminutive or absent anterior communicating artery. Bilateral ACA branches are within normal limits. Left MCA M1 segment and bifurcation are patent without stenosis. Left MCA branches are within normal limits. Right MCA M1 segment and bifurcation are patent  without stenosis. Right MCA branches are within normal limits. Venous sinuses: Patent. Anatomic variants: Mildly bovine arch configuration. Fetal type right PCA origin. Delayed phase: No abnormal enhancement identified. Stable gray-white matter differentiation throughout the brain. Review of the MIP images confirms the above findings IMPRESSION: 1. Negative for large vessel occlusion, and CT Perfusion detects no core infarct or ischemic penumbra. 2. No atherosclerosis or arterial stenosis identified in the head or neck. 3. Stable and negative CT appearance of the brain since 1630 hours today. 4. Mild thyroid goiter. Electronically Signed   By: Odessa Fleming M.D.   On: 03/07/2019 18:50   Mr Maxine Glenn Head/brain AO Cm  Result Date: 03/08/2019 CLINICAL DATA:  Left-sided tingling. Possible seizure. Negative CT evaluation yesterday. EXAM: MRI HEAD WITHOUT CONTRAST MRA HEAD WITHOUT CONTRAST TECHNIQUE: Multiplanar, multiecho pulse sequences of the brain and surrounding structures were obtained without intravenous contrast. Angiographic images of the head were obtained using MRA  technique without contrast. COMPARISON:  CT studies 03/07/2019 FINDINGS: MRI HEAD FINDINGS Brain: Diffusion imaging does not show any acute or subacute infarction. The brainstem and cerebellum are normal. There are scattered punctate foci of T2 and FLAIR signal in the frontal white matter, likely indicating the earliest manifestation of small vessel change. No cortical or large vessel territory infarction. No evidence of mass lesion, hemorrhage, hydrocephalus or extra-axial collection. Vascular: Major vessels at the base of the brain show flow. Skull and upper cervical spine: Negative Sinuses/Orbits: Clear/normal Other: None MRA HEAD FINDINGS Both internal carotid arteries are widely patent into the brain. No siphon stenosis. The anterior and middle cerebral vessels are patent without proximal stenosis, aneurysm or vascular malformation. Both vertebral  arteries are widely patent to the basilar. No basilar stenosis. Posterior circulation branch vessels appear normal. IMPRESSION: No acute finding. Normal except for scattered punctate foci of T2 and FLAIR signal in the frontal white matter likely indicating the earliest manifestation of small vessel change. Normal intracranial MR angiography of the large and medium size vessels per Electronically Signed   By: Paulina FusiMark  Shogry M.D.   On: 03/08/2019 08:52   Ct Angio Chest/abd/pel For Dissection W And/or Wo Contrast  Result Date: 03/07/2019 CLINICAL DATA:  Chest pain.  Unresponsive. EXAM: CT ANGIOGRAPHY CHEST, ABDOMEN AND PELVIS TECHNIQUE: Multidetector CT imaging through the chest, abdomen and pelvis was performed using the standard protocol during bolus administration of intravenous contrast. Multiplanar reconstructed images and MIPs were obtained and reviewed to evaluate the vascular anatomy. CONTRAST:  100mL ISOVUE-370 IOPAMIDOL (ISOVUE-370) INJECTION 76% COMPARISON:  CT abdomen and pelvis 11/13/2018 FINDINGS: CTA CHEST FINDINGS Cardiovascular: Heart is normal size. Aorta is normal caliber. No dissection. Mediastinum/Nodes: No mediastinal, hilar, or axillary adenopathy. Lungs/Pleura: Dependent atelectasis in the lungs.  No effusions. Musculoskeletal: No acute bony abnormality. Review of the MIP images confirms the above findings. CTA ABDOMEN AND PELVIS FINDINGS VASCULAR Aorta: Normal caliber.  No aneurysm or dissection. Celiac: Widely patent SMA: Widely patent Renals: Single bilaterally, widely patent IMA: Widely patent Inflow: Patent.  No aneurysm or dissection. Veins: Grossly patent. Review of the MIP images confirms the above findings. NON-VASCULAR Hepatobiliary: Insert paddle biliary Pancreas: No focal abnormality or ductal dilatation. Spleen: No focal abnormality.  Normal size. Adrenals/Urinary Tract: No adrenal abnormality. No focal renal abnormality. No stones or hydronephrosis. Urinary bladder is  unremarkable. Stomach/Bowel: Normal appendix. Stomach, large and small bowel grossly unremarkable. Lymphatic: Shotty retroperitoneal lymph nodes.  No adenopathy. Reproductive: Prior hysterectomy.  No adnexal masses. Other: No free fluid or free air. Musculoskeletal: No acute bony abnormality. Prior left hip replacement. Degenerative changes in the lumbar spine. Review of the MIP images confirms the above findings. IMPRESSION: No evidence of aortic aneurysm or dissection. Bibasilar atelectasis.  No acute cardiopulmonary disease. No acute findings in the abdomen or pelvis. Electronically Signed   By: Charlett NoseKevin  Dover M.D.   On: 03/07/2019 16:56    ASSESSMENT AND PLAN:   Active Problems:   Arm weakness  *Acute left arm / leg weakness with altered mental status Acute stroke is ruled out by negative MRI.  CT angiogram head and neck is done without any abnormalities.  PT/OT/speech therapy to evaluate/treat, increase nursing care PRN,   treat hypoglycemia, seizure precautions, avoid unnecessary sedating agents, and continue close medical monitoring Appreciate help by neurologist, started on Vimpat for now and suggested to discharge on that.  *Acute possible recurrent seizure Note patient was taken off antiepileptic drugs 6 months ago by neurology/Dr. 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  *Acute hypoglycemia Continue D10, hypoglycemic protocol, Accu-Cheks every 2 hours , improved now.  blood sugar remains stable.  *Chronic hypoxic respiratory failure Secondary to COPD, sarcoid Stable Continue 2 L via nasal cannula continuous which is her home dose  *Chronic obstructive sleep apnea Stable CPAP at bedtime/as needed  *Chronic bipolar illness Continue home psychotropic regiment  called psych consult to help as there is no clear neurological reason for her weakness.  *Chronic morbid obesity Most likely  secondary to excess calories Lifestyle modification recommended  *UTI She does not have much symptoms of UTI but her UA is positive and she has complains of weakness and increased frequency so I will start on Rocephin and send urine culture.  All the records are reviewed and case discussed with Care Management/Social Workerr. Management plans discussed with the patient, family and they are in agreement.  CODE STATUS: Full.  TOTAL TIME TAKING CARE OF THIS PATIENT: 35 minutes.     POSSIBLE D/C IN 1-2 DAYS, DEPENDING ON CLINICAL CONDITION.   Altamese Dilling M.D on 03/09/2019   Between 7am to 6pm - Pager - [phone]  After 6pm go to [email or web address] - password [email or web address]  [facility]  Office  [phone]  CC: Primary care physician; Carlean Jews, NP  Note: This dictation was prepared with Dragon dictation along with smaller phrase technology. Any transcriptional errors that result from this process are unintentional.

## 2019-03-09 NOTE — Evaluation (Signed)
Occupational Therapy Evaluation Patient Details Name: Nicole Wyatt MRN: [id number] DOB: [birth year] Today's Date: 03/09/2019    History of Present Illness 53 yo female with onset of L side weakness esp UE was admitted and had no findings on her brain MRI.  Had clear lung imaging, noted atelectasis. Had initially low BS, possible seizures, AMS.  Pt is home with sick family who cannot care for her.  PMHx:  goiter, sarcoidosis, morbid obesity, chronic respiratory failure, bipolar illness,    Clinical Impression   Pt seen for OT evaluation this date. Prior to hospital admission, pt was independent and living at home with elder parents who are receiving hospice services. Currently pt demonstrates impairments in L sided strength/coordination, L eye L visual field deficits (pt denies visual deficits in R eye with resting), and activity tolerance requiring MAX assist for LB ADL from bed level. Pt reported recent L sided chest pains around 9am, denied chest pains during session. Pt reports sharp and shooting pain down into LUE with associated tingling. RN notified when she came in to take vitals. Pt instructed in LUE there ex to perform to improve strength and coordination for LUE. Pt would benefit from skilled OT to address noted impairments and functional limitations (see below for any additional details) in order to maximize safety and independence while minimizing falls risk and caregiver burden.  Upon hospital discharge, recommend pt discharge to STR.    Follow Up Recommendations  SNF    Equipment Recommendations  (TBD)    Recommendations for Other Services       Precautions / Restrictions Precautions Precautions: Fall Precaution Comments: watch HR Restrictions Weight Bearing Restrictions: No      Mobility Bed Mobility               General bed mobility comments: deferred 2/2 pt reporting chest pains recently  Transfers                 General transfer comment: deferred  2/2 pt reporting chest pains recently    Balance                                           ADL either performed or assessed with clinical judgement   ADL Overall ADL's : Needs assistance/impaired Eating/Feeding: Modified independent;Sitting Eating/Feeding Details (indicate cue type and reason): pt reports able to eat breakfast without difficulty  Grooming: Bed level;Set up   Upper Body Bathing: Minimal assistance;Bed level;Moderate assistance   Lower Body Bathing: Bed level;Maximal assistance   Upper Body Dressing : Bed level;Moderate assistance;Minimal assistance   Lower Body Dressing: Bed level;Maximal assistance     Toilet Transfer Details (indicate cue type and reason): deferred                 Vision Baseline Vision/History: No visual deficits Patient Visual Report: Blurring of vision Vision Assessment?: Yes Eye Alignment: Within Functional Limits Ocular Range of Motion: Within Functional Limits Alignment/Gaze Preference: Within Defined Limits Tracking/Visual Pursuits: Able to track stimulus in all quads without difficulty Visual Fields: Impaired-to be further tested in functional context;Left visual field deficit(appears to have L visual field deficit in L eye)     Perception     Praxis      Pertinent Vitals/Pain Pain Assessment: No/denies pain(reported having L side chest pain (sharp and radiating with numbness/tingling in LUE) around 9am, RN notified)  Hand Dominance Right   Extremity/Trunk Assessment Upper Extremity Assessment Upper Extremity Assessment: LUE deficits/detail LUE Deficits / Details: 2+/5, at times able to use LUE more freely (e.g., covered L eye with L hand promptly and then switched to RUE), denies sensory deficits now, decreased coordination LUE Sensation: WNL LUE Coordination: decreased fine motor;decreased gross motor   Lower Extremity Assessment Lower Extremity Assessment: LLE deficits/detail LLE Deficits /  Details: grossly 2+/5 LLE Coordination: decreased fine motor;decreased gross motor   Cervical / Trunk Assessment Cervical / Trunk Assessment: Normal   Communication Communication Communication: No difficulties   Cognition Arousal/Alertness: Awake/alert Behavior During Therapy: Flat affect Overall Cognitive Status: Within Functional Limits for tasks assessed                                 General Comments: alert and oriented, follows commands   General Comments  RN in room to take vitals, pt then reported chest pains around 9am, sharp and radiating to LUE causing numbness/tingling, denied chest pains at time of evaluation, O2 on 2L 100%, HR 64, BP 103/51    Exercises Other Exercises Other Exercises: pt instructed in LUE ther ex to perform to improve LUE strength/coordination   Shoulder Instructions      Home Living Family/patient expects to be discharged to:: Private residence Living Arrangements: Children;Parent Available Help at Discharge: Family Type of Home: House Home Access: Ramped entrance     Home Layout: One level     Bathroom Shower/Tub: Producer, television/film/videoWalk-in shower   Bathroom Toilet: Standard     Home Equipment: Environmental consultantWalker - 2 wheels;Gilmer MorCane - single point   Additional Comments: parents are sick and under hospice care      Prior Functioning/Environment Level of Independence: Independent        Comments: has been able to mobilize independently and no AD needed. indep with ADL, driving.        OT Problem List: Decreased strength;Decreased coordination;Impaired UE functional use;Impaired sensation;Impaired balance (sitting and/or standing);Decreased activity tolerance;Decreased knowledge of use of DME or AE;Impaired vision/perception      OT Treatment/Interventions: Self-care/ADL training;Balance training;Therapeutic exercise;Therapeutic activities;Neuromuscular education;DME and/or AE instruction;Patient/family education;Visual/perceptual  remediation/compensation    OT Goals(Current goals can be found in the care plan section) Acute Rehab OT Goals Patient Stated Goal: to get stronger OT Goal Formulation: With patient Time For Goal Achievement: 03/23/19 Potential to Achieve Goals: Good ADL Goals Pt Will Perform Grooming: with set-up;with supervision;sitting Pt Will Perform Upper Body Dressing: with min guard assist;sitting Pt Will Perform Lower Body Dressing: with min assist;sit to/from stand;with mod assist Pt Will Transfer to Toilet: with min assist;with mod assist;ambulating;bedside commode(LRAD for amb)  OT Frequency: Min 2X/week   Barriers to D/C: Decreased caregiver support          Co-evaluation              AM-PAC OT "6 Clicks" Daily Activity     Outcome Measure Help from another person eating meals?: None Help from another person taking care of personal grooming?: A Little Help from another person toileting, which includes using toliet, bedpan, or urinal?: A Lot Help from another person bathing (including washing, rinsing, drying)?: A Lot Help from another person to put on and taking off regular upper body clothing?: A Little Help from another person to put on and taking off regular lower body clothing?: A Lot 6 Click Score: 16   End of Session Nurse Communication: Other (comment)(pt  reported having chest pains prior to OT session ~9am)  Activity Tolerance: Patient tolerated treatment well Patient left: in bed;with call bell/phone within reach;with bed alarm set  OT Visit Diagnosis: Other abnormalities of gait and mobility (R26.89);Hemiplegia and hemiparesis;Muscle weakness (generalized) (M62.81) Hemiplegia - Right/Left: Left Hemiplegia - dominant/non-dominant: Non-Dominant Hemiplegia - caused by: Unspecified                Time: 3536-1443 OT Time Calculation (min): 22 min Charges:  OT General Charges $OT Visit: 1 Visit OT Evaluation $OT Eval Moderate Complexity: 1 Mod OT  Treatments $Therapeutic Exercise: 8-22 mins  Richrd Prime, MPH, MS, OTR/L ascom [phone] 03/09/19, 10:53 AM

## 2019-03-09 NOTE — Consult Note (Addendum)
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  Cognition:  WNL  Sleep:   Good per pt     Assessment and Treatment Plan Summary:  Major depressive disorder, 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  She is caring for elderly parents (both of whom are in home hospice).  She has her own health concerns.  She has been on antidepressant medications for past 3-4 yrs and finds them helpful.  Pt understands that psychological stressors can induce and/or contribute to the exacerbation of  physical issues.  Pt was evaluated by neurology and her EEG did not show seizure activity.   Pt would like to continue with current antidepressant regimen and she is willing to go to outpatient psychotherapy.    Will follow patient while inpatient to assess and evaluate symptoms and progress in treatment. Medication management (see below)  Will get EKG to monitor QTc as QTc done on 03/07/2019 was increased at 474 ms.  Will check EKG prior to restarting antidepressant medications as antidepressants can contribute to QTc prolongation.  If QTc is normal, will resume antidepressants.  Repeat EKG-QTc 442  Restart home dose of Venlafaxine XR 75 mg qam with breakfast; restart Zoloft 200 mg qd.   Pt is also open to outpatient psychotherapy.  She has been to [facility] in the past and found it beneficial.  Pt provided with handout with [facility]'s information, as well as, [employer].  Pt states she will likely call [facility] in [place] to schedule an appointment with a therapist or psychologist.    Pt does not meet criteria for inpatient psychiatric admission.  She agrees with above plan.   I discussed risks (including but not limited QTc prolongation) and benefits of her current antidepressant medication with patient and she voices understanding and agrees with the above treatment plan.   Pt also provided with handout on risks and benefits of sertraline and venlafaxine.   Supportive therapy provided about ongoing stressors.   Disposition: No evidence of imminent risk to self or others at present.   Patient does not meet criteria for psychiatric inpatient admission. Can  discharge home from psychiatric perspective.    Hessie Knows, MD 03/09/2019 12:42 PM

## 2019-03-10 DIAGNOSIS — R531 Weakness: Secondary | ICD-10-CM

## 2019-03-10 LAB — GLUCOSE, CAPILLARY
Glucose-Capillary: 110 mg/dL — ABNORMAL HIGH (ref 70–99)
Glucose-Capillary: 71 mg/dL (ref 70–99)
Glucose-Capillary: 87 mg/dL (ref 70–99)
Glucose-Capillary: 92 mg/dL (ref 70–99)
Glucose-Capillary: <10 mg/dL — CL (ref 70–99)

## 2019-03-10 LAB — HIV ANTIBODY (ROUTINE TESTING W REFLEX): HIV Screen 4th Generation wRfx: NONREACTIVE

## 2019-03-10 MED ORDER — CEFUROXIME AXETIL 250 MG PO TABS: 250.0000 mg | ORAL_TABLET | Freq: Two times a day (BID) | ORAL | 0 refills | Status: AC

## 2019-03-10 MED ORDER — SERTRALINE HCL 50 MG PO TABS
200.0000 mg | ORAL_TABLET | Freq: Every day | ORAL | Status: DC
  Administered 2019-03-10: 200 mg via ORAL
  Filled 2019-03-10: qty 4

## 2019-03-10 MED ORDER — LACOSAMIDE 50 MG PO TABS: 50.0000 mg | ORAL_TABLET | Freq: Two times a day (BID) | ORAL | 0 refills | Status: DC

## 2019-03-10 MED ORDER — MOMETASONE FURO-FORMOTEROL FUM 100-5 MCG/ACT IN AERO: 2.0000 | INHALATION_SPRAY | Freq: Every day | RESPIRATORY_TRACT | 0 refills | Status: DC | PRN

## 2019-03-10 MED ORDER — ATORVASTATIN CALCIUM 40 MG PO TABS: 40.0000 mg | ORAL_TABLET | Freq: Every day | ORAL | 0 refills | Status: DC

## 2019-03-10 MED ORDER — DOCUSATE SODIUM 100 MG PO CAPS: 100.0000 mg | ORAL_CAPSULE | Freq: Two times a day (BID) | ORAL | 0 refills | Status: DC

## 2019-03-10 NOTE — Progress Notes (Signed)
Subjective: No seizure activity seen on EEG.     Past Medical History:  Diagnosis Date  . Abnormal Pap smear of cervix   . Anxiety   . Arthritis   . Bipolar 1 disorder (HCC)   . COPD (chronic obstructive pulmonary disease) (HCC)   . Depression   . Dyspnea    with exertion   . Kidney stone   . Oxygen dependent    patient uses 2L oz PRN ; reports hardly ever uses it unless very SOB    . [place] spotted fever 2018  . Sarcoidosis of lung (HCC)    managed by Dr Lindie Spruce   . Seizures (HCC) last seizure 05/2013  . Sleep apnea    uses CPAP nightly   . UTI (urinary tract infection) 11/13/2018   see ED visit in epic ; was dc'd with oral abx and reports completed and relief of sx     Past Surgical History:  Procedure Laterality Date  . ABDOMINAL HYSTERECTOMY  1999  . BLADDER SURGERY    . CARDIAC CATHETERIZATION Bilateral 05/29/2016   Procedure: Right/Left Heart Cath and Coronary Angiography;  Surgeon: Lamar Blinks, MD;  Location: [facility] INVASIVE CV LAB;  Service: Cardiovascular;  Laterality: Bilateral;  . COLONOSCOPY    . COLONOSCOPY N/A 07/19/2015   Procedure: COLONOSCOPY;  Surgeon: Earline Mayotte, MD;  Location: [facility] ENDOSCOPY;  Service: Endoscopy;  Laterality: N/A;  . FLEXIBLE BRONCHOSCOPY N/A 07/27/2016   Procedure: FLEXIBLE BRONCHOSCOPY;  Surgeon: Yevonne Pax, MD;  Location: [facility] ORS;  Service: Pulmonary;  Laterality: N/A;  . FOOT SURGERY    . KIDNEY STONE SURGERY  7 years  . TOTAL HIP ARTHROPLASTY Left 12/02/2018   Procedure: TOTAL HIP ARTHROPLASTY ANTERIOR APPROACH;  Surgeon: Sheral Apley, MD;  Location: [facility] ORS;  Service: Orthopedics;  Laterality: Left;  . TUBAL LIGATION      Family History  Problem Relation Age of Onset  . Hypertension Mother   . Arthritis Mother   . Hypertension Father   . Arthritis Father   . Prostate cancer Father   . Ovarian cancer Maternal Grandmother    Social History:  reports that she has been smoking cigarettes. She has a 7.00  pack-year smoking history. She has never used smokeless tobacco. She reports current alcohol use. She reports that she does not use drugs.  Allergies:  Allergies  Allergen Reactions  . Bee Venom Anaphylaxis  . Ciprofloxacin Nausea Only  . Penicillins Rash and Other (See Comments)    Has patient had a PCN reaction causing immediate rash, facial/tongue/throat swelling, SOB or lightheadedness with hypotension: no Has patient had a PCN reaction causing severe rash involving mucus membranes or skin necrosis: no Has patient had a PCN reaction that required hospitalization no Has patient had a PCN reaction occurring within the last 10 years: {yes If all of the above answers are "NO", then may proceed with Cephalosporin use.     Medications:  I have reviewed the patient's current medications. Prior to Admission:  Medications Prior to Admission  Medication Sig Dispense Refill Last Dose  . albuterol (PROVENTIL HFA;VENTOLIN HFA) 108 (90 Base) MCG/ACT inhaler Inhale 2 puffs into the lungs every 4 (four) hours as needed for wheezing or shortness of breath. 1 Inhaler 5 Unknown at PRN  . aspirin EC 81 MG tablet Take 1 tablet (81 mg total) by mouth 2 (two) times daily. For DVT prophylaxis for 30 days after surgery. (Patient taking differently: Take 81 mg by mouth daily. )  60 tablet 0 03/07/2019 at 0900  . fluticasone (FLONASE) 50 MCG/ACT nasal spray Place 2 sprays into both nostrils daily. 16 g 2 Unknown at Unknown  . furosemide (LASIX) 40 MG tablet Take 1 tablet (40 mg total) by mouth daily as needed for fluid. (Patient taking differently: Take 40 mg by mouth daily. ) 30 tablet 5 03/07/2019 at 0900  . sertraline (ZOLOFT) 100 MG tablet Take 2 tablets (200 mg total) by mouth daily. 60 tablet 5 03/07/2019 at 0900  . venlafaxine XR (EFFEXOR-XR) 75 MG 24 hr capsule Take 1 capsule (75 mg total) by mouth daily with breakfast. 30 capsule 5 03/07/2019 at 0900  . albuterol (PROVENTIL) (2.5 MG/3ML) 0.083% nebulizer  solution Take 3 mLs (2.5 mg total) by nebulization every 6 (six) hours as needed for wheezing or shortness of breath. (Patient not taking: Reported on 03/07/2019) 75 mL 12 Completed Course at Unknown time  . docusate sodium (COLACE) 100 MG capsule Take 1 capsule (100 mg total) by mouth 2 (two) times daily. To prevent constipation while taking pain medication. (Patient not taking: Reported on 03/07/2019) 60 capsule 0 Completed Course at Unknown time  . famotidine (PEPCID) 20 MG tablet Take 1 tablet (20 mg total) by mouth 2 (two) times daily. For Gastroprotection 60 tablet 0   . gabapentin (NEURONTIN) 300 MG capsule Take 1 capsule (300 mg total) by mouth 3 (three) times daily for 14 days. For 2 weeks post op for pain. 42 capsule 0   . methocarbamol (ROBAXIN) 500 MG tablet Take 1 tablet (500 mg total) by mouth every 8 (eight) hours as needed for muscle spasms. (Patient not taking: Reported on 03/07/2019) 40 tablet 0 Completed Course at Unknown time  . neomycin-polymyxin-hydrocortisone (CORTISPORIN) 3.5-10000-1 OTIC suspension Place 4 drops into both ears 2 (two) times daily. (Patient not taking: Reported on 03/07/2019) 10 mL 0 Completed Course at Unknown time  . ondansetron (ZOFRAN) 4 MG tablet Take 1 tablet (4 mg total) by mouth every 8 (eight) hours as needed for nausea or vomiting. (Patient not taking: Reported on 03/07/2019) 20 tablet 0 Completed Course at Unknown time  . tamsulosin (FLOMAX) 0.4 MG CAPS capsule Take 1 capsule (0.4 mg total) by mouth daily. (Patient not taking: Reported on 03/07/2019) 30 capsule 0 Completed Course at Unknown time   Scheduled: . aspirin  300 mg Rectal Daily   Or  . aspirin EC  325 mg Oral Daily  . atorvastatin  40 mg Oral q1800  . docusate sodium  100 mg Oral BID  . enoxaparin (LOVENOX) injection  40 mg Subcutaneous Q24H  . famotidine  20 mg Oral BID  . fluticasone  2 spray Each Nare Daily  . insulin aspart  0-5 Units Subcutaneous QHS  . insulin aspart  0-9 Units  Subcutaneous TID WC  . lacosamide  50 mg Oral BID  . mouth rinse  15 mL Mouth Rinse BID  . mometasone-formoterol  2 puff Inhalation BID  . potassium chloride  40 mEq Oral Once  . sertraline  200 mg Oral Daily  . tamsulosin  0.4 mg Oral Daily  . venlafaxine XR  75 mg Oral Q breakfast    ROS: History obtained from the patient  General ROS: fatigue Psychological ROS: depression Ophthalmic ROS: negative for - blurry vision, double vision, eye pain or loss of vision ENT ROS: negative for - epistaxis, nasal discharge, oral lesions, sore throat, tinnitus or vertigo Allergy and Immunology ROS: negative for - hives or itchy/watery eyes Hematological and  Lymphatic ROS: negative for - bleeding problems, bruising or swollen lymph nodes Endocrine ROS: negative for - galactorrhea, hair pattern changes, polydipsia/polyuria or temperature intolerance Respiratory ROS: negative for - cough, hemoptysis, shortness of breath or wheezing Cardiovascular ROS: chest pain Gastrointestinal ROS: negative for - abdominal pain, diarrhea, hematemesis, nausea/vomiting or stool incontinence Genito-Urinary ROS: negative for - dysuria, hematuria, incontinence or urinary frequency/urgency Musculoskeletal ROS: negative for - joint swelling or muscular weakness Neurological ROS: as noted in HPI Dermatological ROS: negative for rash and skin lesion changes  Physical Examination: Blood pressure (!) 105/52, pulse 68, temperature 98.6 F (37 C), temperature source Oral, resp. rate 16, height  (1.702 m), weight 111.1 kg, SpO2 93 %.  HEENT-  Normocephalic, no lesions, without obvious abnormality.  Normal external eye and conjunctiva.  Normal TM's bilaterally.  Normal auditory canals and external ears. Normal external nose, mucus membranes and septum.  Normal pharynx. Cardiovascular- S1, S2 normal, pulses palpable throughout   Lungs- chest clear, no wheezing, rales, normal symmetric air entry Abdomen- soft, non-tender;  bowel sounds normal; no masses,  no organomegaly Extremities- no edema Lymph-no adenopathy palpable Musculoskeletal-no joint tenderness, deformity or swelling Skin-warm and dry, no hyperpigmentation, vitiligo, or suspicious lesions  Neurological Examination   Mental Status: Alert, oriented, thought content appropriate.  Speech fluent without evidence of aphasia.  Able to follow 3 step commands without difficulty. Cranial Nerves: II: Discs flat bilaterally; LHH, pupils equal, round, reactive to light and accommodation III,IV, VI: ptosis not present, extra-ocular motions intact bilaterally V,VII: right facial droop, facial light touch sensation decreased on the left VIII: hearing normal bilaterally IX,X: gag reflex present XI: bilateral shoulder shrug XII: midline tongue extension Motor: Right : Upper extremity   5/5    Left:     Upper extremity   3/5  Lower extremity   5-/5     Lower extremity   3/5 Tone and bulk:normal tone throughout; no atrophy noted Sensory: Pinprick and light touch decreased on the left Deep Tendon Reflexes: 2+ and symmetric with absent AJ's bilaterally Plantars: Right: mute   Left: mute Cerebellar: Normal finger-to-nose and normal heel-to-shin testing on the right.  Unable to perform on the left due to weakness Gait: not tested due to safety concerns  Laboratory Studies:  Basic Metabolic Panel: Recent Labs  Lab 03/07/19 1609 03/08/19 0357  NA 139 139  K 3.5 3.2*  CL 105 106  CO2 24 26  GLUCOSE 86 111*  BUN 11 9  CREATININE 0.61 0.73  CALCIUM 8.8* 8.3*    Liver Function Tests: Recent Labs  Lab 03/07/19 1609  AST 20  ALT 12  ALKPHOS 76  BILITOT 0.8  PROT 7.7  ALBUMIN 4.0   No results for input(s): LIPASE, AMYLASE in the last 168 hours. No results for input(s): AMMONIA in the last 168 hours.  CBC: Recent Labs  Lab 03/07/19 1609 03/08/19 0357  WBC 6.6 5.9  NEUTROABS 3.4  --   HGB 13.3 11.7*  HCT 40.0 36.2  MCV 89.9 90.3  PLT 300  278    Cardiac Enzymes: Recent Labs  Lab 03/07/19 1609 03/07/19 2148 03/08/19 0357 03/08/19 1016 03/09/19 1038  TROPONINI <0.03 <0.03 <0.03 <0.03 <0.03    BNP: Invalid input(s): POCBNP  CBG: Recent Labs  Lab 03/09/19 1645 03/09/19 2017 03/10/19 0029 03/10/19 0746 03/10/19 1153  GLUCAP 87 107* 110* 92 87    Microbiology: Results for orders placed or performed during the hospital encounter of 11/25/18  Surgical pcr screen  Status: None   Collection Time: 11/25/18  8:59 AM  Result Value Ref Range Status   MRSA, PCR NEGATIVE NEGATIVE Final   Staphylococcus aureus NEGATIVE NEGATIVE Final    Comment: (NOTE) The Xpert SA Assay (FDA approved for NASAL specimens in patients 55 years of age and older), is one component of a comprehensive surveillance program. It is not intended to diagnose infection nor to guide or monitor treatment. Performed at [facility], [address] [place], [place] [address]     Coagulation Studies: Recent Labs    03/07/19 1609  LABPROT 12.8  INR 1.0    Urinalysis:  Recent Labs  Lab 03/07/19 1609  COLORURINE STRAW*  LABSPEC 1.012  PHURINE 7.0  GLUCOSEU 50*  HGBUR SMALL*  BILIRUBINUR NEGATIVE  KETONESUR NEGATIVE  PROTEINUR NEGATIVE  NITRITE NEGATIVE  LEUKOCYTESUR SMALL*    Lipid Panel:    Component Value Date/Time   CHOL 164 03/08/2019 0357   CHOL 177 07/02/2018 1309   CHOL 194 11/29/2013 0504   TRIG 136 03/08/2019 0357   TRIG 61 11/29/2013 0504   HDL 36 (L) 03/08/2019 0357   HDL 51 07/02/2018 1309   HDL 43 11/29/2013 0504   CHOLHDL 4.6 03/08/2019 0357   VLDL 27 03/08/2019 0357   VLDL 12 11/29/2013 0504   LDLCALC 101 (H) 03/08/2019 0357   LDLCALC 113 (H) 07/02/2018 1309   LDLCALC 139 (H) 11/29/2013 0504    HgbA1C:  Lab Results  Component Value Date   HGBA1C 4.9 03/08/2019    Urine Drug Screen:      Component Value Date/Time   LABOPIA NONE DETECTED 03/07/2019 1609   COCAINSCRNUR  NONE DETECTED 03/07/2019 1609   LABBENZ NONE DETECTED 03/07/2019 1609   AMPHETMU NONE DETECTED 03/07/2019 1609   THCU POSITIVE (A) 03/07/2019 1609   LABBARB NONE DETECTED 03/07/2019 1609    Alcohol Level:  Recent Labs  Lab 03/07/19 1609  ETH <10    Other results: EKG: sinus rhythm at 65 bpm.  Prolonged PR interval.    Imaging: Ct Lumbar Spine Wo Contrast  Result Date: 03/09/2019 CLINICAL DATA:  Initial evaluation for left-sided muscle weakness. EXAM: CT LUMBAR SPINE WITHOUT CONTRAST TECHNIQUE: Multidetector CT imaging of the lumbar spine was performed without intravenous contrast administration. Multiplanar CT image reconstructions were also generated. COMPARISON:  Prior radiograph from 11/30/2013. FINDINGS: Segmentation: Standard. Lowest well-formed disc labeled the L5-S1 level. Alignment: Trace anterolisthesis of L5 on S1, likely chronic and facet mediated. Alignment otherwise normal with preservation of the normal lumbar lordosis. No malalignment. Vertebrae: Vertebral body heights maintained without evidence for acute or chronic fracture. Visualized sacrum and pelvis intact. SI joints approximated symmetric. No discrete osseous lesions. Paraspinal and other soft tissues: Paraspinous soft tissues demonstrate no acute finding. Visualized visceral structures and intra-abdominal contents unremarkable. Mild for age atherosclerotic change. Disc levels: T11-12: Normal interspace. Right-sided posterior element hypertrophy. No significant stenosis. T12-L1: Unremarkable. L1-2:  Unremarkable. L2-3: Normal interspace. Mild bilateral facet hypertrophy. No canal or foraminal stenosis. L3-4: Mild annular disc bulge. Moderate facet and ligament flavum hypertrophy. No significant canal or foraminal stenosis. L4-5: Right eccentric disc bulge with superimposed right foraminal disc protrusion (series 5, image 86). Moderate facet and ligament flavum hypertrophy. Resultant moderate canal with right greater than left  lateral recess stenosis. Mild to moderate bilateral foraminal narrowing. L5-S1: Trace anterolisthesis. Diffuse disc bulge with intervertebral disc space narrowing. Disc bulging asymmetric to the right with superimposed right subarticular disc protrusion (series 5, image 99). Advanced bilateral  facet hypertrophy. Resultant moderate to severe bilateral subarticular stenosis with moderate bilateral foraminal narrowing. Central canal remains patent. IMPRESSION: 1. Disc bulging with superimposed right subarticular disc protrusion and advanced facet hypertrophy at L5-S1, resulting in moderate to severe bilateral subarticular stenosis with moderate bilateral L5 foraminal narrowing. Either of the L5 or descending S1 nerve roots could be affected. 2. Disc bulging with superimposed right foraminal disc protrusion at L4-5 with resultant moderate canal with right greater than left lateral recess stenosis, with mild to moderate bilateral L4 foraminal narrowing. 3. No acute abnormality within the lumbar spine. Electronically Signed   By: Rise Mu M.D.   On: 03/09/2019 14:53    Assessment/Plan: 53 y.o. female presenting with seizure-like activity and left sided weakness.  Patient with multiple previous similar events.  Work ups in the past have been unrevealing.   - Pt is on Vimpat. I am not convinced these are true epileptic seizures - EEG no epileptiform discharges - CT L spine chronic Degenerative disk dz - She did see Dr. Sherryll Burger in the past and was discharged from practice. I would encourage for her to follow up with him.   - d/c planning form neuro stand pont on Vimpat  03/10/2019, 12:00 PM
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
## 2019-03-10 NOTE — Progress Notes (Signed)
Physical Therapy Treatment Patient Details Name: Nicole Wyatt MRN: [id number] DOB: 25-Jan-1966 Today's Date: 03/10/2019    History of Present Illness 53 yo female with onset of L side weakness esp UE was admitted and had no findings on her brain MRI.  Possible seizure-activity noted, hypoglycemia apparent on admission. Pt is home with sick family who cannot care for her.  PMHx:  goiter, sarcoidosis, morbid obesity, chronic respiratory failure, bipolar illness,     PT Comments    Significant improvement in L UE/LE functional use noted this date.  Able to complete bed mobility with mod indep; sit/stand, basic transfers and gait (200') with rW, cga/close sup.  Good WBing and control throughout L hemi-body; no overt buckling, LOB or safety concern noted throughout mobility tasks.  Gait speed/cadence slow and deliberate (10' walk time, 11-12 seconds), but patient with good safety awareness/insight. Given functional improvement, discharge recommendations updated to reflect HHPT; has equipment necessary.    Follow Up Recommendations  Home health PT     Equipment Recommendations  (has RW)    Recommendations for Other Services       Precautions / Restrictions Precautions Precautions: Fall Restrictions Weight Bearing Restrictions: No    Mobility  Bed Mobility Overal bed mobility: Modified Independent                Transfers Overall transfer level: Needs assistance Equipment used: Rolling walker (2 wheeled) Transfers: Sit to/from Stand Sit to Stand: Min guard         General transfer comment: cuing for hand placement, fair/good activation of bilat LEs with lift off  Ambulation/Gait Ambulation/Gait assistance: Supervision;Min guard Gait Distance (Feet): 200 Feet Assistive device: Rolling walker (2 wheeled)   Gait velocity: 10' walk time, 11-12 seconds   General Gait Details: reciprocal stepping pattern with fair step height/lenght bilat; decreased cadence, but no  buckling or LOB.  Completes turns, changes of direction without difficulty; appropriately maintains L UE grasp on RW   Stairs             Wheelchair Mobility    Modified Rankin (Stroke Patients Only)       Balance Overall balance assessment: Needs assistance Sitting-balance support: No upper extremity supported;Feet supported Sitting balance-Leahy Scale: Good     Standing balance support: Bilateral upper extremity supported Standing balance-Leahy Scale: Fair                              Cognition Arousal/Alertness: Awake/alert Behavior During Therapy: WFL for tasks assessed/performed Overall Cognitive Status: Within Functional Limits for tasks assessed                                        Exercises Other Exercises Other Exercises: Toilet transfer, ambulatory with RW, cga/close sup; decreased eccentric control with stand to sit.  Sit/stand from standard height toilet, sup/mod indep; indep with hygiene and clothing management Other Exercises: Hand hygiene at sink, sup/mod indep; good awareness of safety needs and overall limits of stability    General Comments        Pertinent Vitals/Pain Pain Assessment: No/denies pain    Home Living                      Prior Function            PT Goals (current goals can  now be found in the care plan section) Acute Rehab PT Goals Patient Stated Goal: to get stronger PT Goal Formulation: With patient Time For Goal Achievement: 03/22/19 Potential to Achieve Goals: Good Progress towards PT goals: Progressing toward goals    Frequency    Min 2X/week      PT Plan Current plan remains appropriate    Co-evaluation              AM-PAC PT "6 Clicks" Mobility   Outcome Measure  Help needed turning from your back to your side while in a flat bed without using bedrails?: None Help needed moving from lying on your back to sitting on the side of a flat bed without using  bedrails?: None Help needed moving to and from a bed to a chair (including a wheelchair)?: A Little Help needed standing up from a chair using your arms (e.g., wheelchair or bedside chair)?: A Little Help needed to walk in hospital room?: A Little Help needed climbing 3-5 steps with a railing? : A Little 6 Click Score: 20    End of Session Equipment Utilized During Treatment: Gait belt Activity Tolerance: Patient tolerated treatment well Patient left: in chair;with call bell/phone within reach;with chair alarm set Nurse Communication: Mobility status PT Visit Diagnosis: Unsteadiness on feet (R26.81);Muscle weakness (generalized) (M62.81);Difficulty in walking, not elsewhere classified (R26.2)     Time: 6734-1937 PT Time Calculation (min) (ACUTE ONLY): 16 min  Charges:  $Gait Training: 8-22 mins                     Tasheika Kitzmiller H. Manson Passey, PT, DPT, NCS 03/10/19, 4:34 PM [phone]

## 2019-03-10 NOTE — Care Management Important Message (Signed)
Important Message  Patient Details  Name: Nicole Wyatt MRN: [id number] Date of Birth: Apr 05, 1966   Medicare Important Message Given:  Yes    Olegario Messier A [name] 03/10/2019, 10:26 AM

## 2019-03-10 NOTE — Progress Notes (Signed)
Occupational Therapy Treatment Patient Details Name: Nicole Wyatt MRN: [id number] DOB: July 09, 1966 Today's Date: 03/10/2019    History of present illness 53 yo female with onset of L side weakness esp UE was admitted and had no findings on her brain MRI.  Had clear lung imaging, noted atelectasis. Had initially low BS, possible seizures, AMS.  Pt is home with sick family who cannot care for her.  PMHx:  goiter, sarcoidosis, morbid obesity, chronic respiratory failure, bipolar illness,    OT comments  Pt seen for OT tx this date focused on self care skills for grooming and BUE there-ex. With set up of supplies, pt able to brush teeth with encouragement to incorporate LUE into task. Pt able to stabilize toothpaste with L hand to squeeze toothpaste onto brush. Able to hold water mug with straw with L hand to bring to mouth. Pt instructed in bilat UE ther-ex with resistence to improve strength/coordination for ADL and functional mobility - pt performed bilat shoulder flexion 2x10, elbow flex/ext 4x10. Pt required verbal cues throughout session to increase LUE participation. LUE continues to be slightly weaker than RUE but with encouragement, LUE participation improves during tasks. Pt appears to be able to do more with LUE than she gives herself credit for. Possible break away strength on L side, but decreased as session went on and pt performed there-ex with family present. Pt continues to benefit from skilled OT services. Will continue to progress.    SNF    Equipment Recommendations  (TBD)    Recommendations for Other Services      Precautions / Restrictions Precautions Precautions: Fall Precaution Comments: watch HR Restrictions Weight Bearing Restrictions: No       Mobility Bed Mobility                  Transfers                      Balance                                           ADL either performed or assessed with clinical judgement    ADL Overall ADL's : Needs assistance/impaired     Grooming: Sitting Grooming Details (indicate cue type and reason): long sitting in bed, set up, able to incorporate LUE this date this verbal cues to encourage LUE participation, brushed teeth with set up, able to hold water mug with handle using L hand                                     Vision       Perception     Praxis      Cognition Arousal/Alertness: Awake/alert Behavior During Therapy: WFL for tasks assessed/performed Overall Cognitive Status: Within Functional Limits for tasks assessed                                          Exercises Other Exercises Other Exercises: pt instructed in bilat UE ther-ex with resistence to improve strength/coordination for ADL and functional mobility - pt performed bilat shoulder flexion 2x10, elbow flex/ext 4x10   Shoulder Instructions       General Comments  Pertinent Vitals/ Pain       Pain Assessment: No/denies pain  Home Living                                          Prior Functioning/Environment              Frequency  Min 2X/week        Progress Toward Goals  OT Goals(current goals can now be found in the care plan section)     Acute Rehab OT Goals Patient Stated Goal: to get stronger OT Goal Formulation: With patient Time For Goal Achievement: 03/23/19 Potential to Achieve Goals: Good  Plan Discharge plan remains appropriate;Frequency remains appropriate    Co-evaluation                 AM-PAC OT "6 Clicks" Daily Activity     Outcome Measure   Help from another person eating meals?: None Help from another person taking care of personal grooming?: A Little Help from another person toileting, which includes using toliet, bedpan, or urinal?: A Lot Help from another person bathing (including washing, rinsing, drying)?: A Lot Help from another person to put on and taking off regular upper  body clothing?: A Little Help from another person to put on and taking off regular lower body clothing?: A Lot 6 Click Score: 16    End of Session    OT Visit Diagnosis: Other abnormalities of gait and mobility (R26.89);Hemiplegia and hemiparesis;Muscle weakness (generalized) (M62.81) Hemiplegia - Right/Left: Left Hemiplegia - dominant/non-dominant: Non-Dominant Hemiplegia - caused by: Unspecified   Activity Tolerance Patient tolerated treatment well   Patient Left in bed;with call bell/phone within reach;with bed alarm set;with family/visitor present;Other (comment)(neurologist in room )   Nurse Communication          Time: 1660-6301 OT Time Calculation (min): 23 min  Charges: OT General Charges $OT Visit: 1 Visit OT Treatments $Self Care/Home Management : 8-22 mins $Therapeutic Exercise: 8-22 mins  Richrd Prime, MPH, MS, OTR/L ascom [phone] 03/10/19, 11:24 AM

## 2019-03-10 NOTE — TOC Transition Note (Signed)
Transition of Care [facility]) - CM/SW Discharge Note   Patient Details  Name: NATARA LEISY MRN: [id number] Date of Birth: 1966-08-26  Transition [facility] Care [facility]) CM/SW Contact:  Gwenette Greet, RN Phone Number: 03/10/2019, 4:33 PM   Clinical Narrative:   Discharge to home today per Dr. Elisabeth Pigeon.  Lives with her parents and 2 children in the home.  Will be followed by [facility]. Telephone call to [facility]. Will accept Ms. Helt as a patient Patient updated    Final next level of care: Home w Home Health Services(Declining skilled nursing facility.) Barriers to Discharge: No Barriers Identified Declined skilled nursing   Patient Goals and CMS Choice Patient states their goals for this hospitalization and ongoing recovery are:: (Wants to go home) CMS Medicare.gov Compare Post Acute Care list provided to:: Patient Choice offered to / list presented to : Patient; Chose [facility]. Faxed to [facility].   Discharge Placement home                       Discharge Plan and Services Discharge Planning Services: CM Consult Post Acute Care Choice: (Wants shower chair)          DME Arranged: Shower stool DME Agency: [facility] HH Arranged: RN, PT(Not yet)  Yes HH Agency: [facility]   Social Determinants of Health (SDOH) Interventions Parents are followed by Hospice in the home. States she will have support in the home from other family members     Readmission Risk Interventions No flowsheet data found.

## 2019-03-11 LAB — URINE CULTURE: Culture: >=100000 — AB

## 2019-03-16 NOTE — Discharge Summary (Signed)
[facility] [place] at [facility]   PATIENT NAME: Nicole Wyatt    MR#:  [id number]  DATE OF BIRTH:  1966/06/10  DATE OF ADMISSION:  03/07/2019 ADMITTING PHYSICIAN: Bertrum Sol, MD  DATE OF DISCHARGE: 03/10/2019  6:47 PM  PRIMARY CARE PHYSICIAN: Carlean Jews, NP    ADMISSION DIAGNOSIS:  Seizure (HCC) [R56.9] Weakness [R53.1] Chest pain, unspecified type [R07.9]  DISCHARGE DIAGNOSIS:  Active Problems:   Arm weakness   SECONDARY DIAGNOSIS:   Past Medical History:  Diagnosis Date  . Abnormal Pap smear of cervix   . Anxiety   . Arthritis   . Bipolar 1 disorder (HCC)   . COPD (chronic obstructive pulmonary disease) (HCC)   . Depression   . Dyspnea    with exertion   . Kidney stone   . Oxygen dependent    patient uses 2L oz PRN ; reports hardly ever uses it unless very SOB    . [place] spotted fever 2018  . Sarcoidosis of lung (HCC)    managed by Dr Lindie Spruce   . Seizures (HCC) last seizure 05/2013  . Sleep apnea    uses CPAP nightly   . UTI (urinary tract infection) 11/13/2018   see ED visit in epic ; was dc'd with oral abx and reports completed and relief of sx     HOSPITAL COURSE:   *Acute left arm / leg weakness with altered mental status Acute stroke is ruled out by negative MRI.  CT angiogram head and neck is done without any abnormalities.  PT/OT/speech therapy to evaluate/treat, increase nursing care PRN,   treat hypoglycemia, seizure precautions, avoid unnecessary sedating agents, and continue close medical monitoring Appreciate help by neurologist, started on Vimpat for now and suggested to discharge on that.  *Acute possible recurrent seizure Note patient was taken off antiepileptic drugs 6 months ago by neurology/Dr.Shahas they were thought to be due to pseudoseizures Neurology suggested to start Vimpat and follow with out pt neuro.  *Acute hypoglycemia Continue D10, hypoglycemic protocol, Accu-Cheks every  2 hours , improved now.  blood sugar remains stable.  *Chronic hypoxic respiratory failure Secondary to COPD, sarcoid Stable Continue 2 L via nasal cannula continuous which is her home dose  *Chronic obstructive sleep apnea Stable CPAP at bedtime/as needed  *Chronic bipolar illness Continue home psychotropic regiment  called psych consult to help as there is no clear neurological reason for her weakness.  *Chronic morbid obesity Most likely secondary to excess calories Lifestyle modification recommended  *UTI She does not have much symptoms of UTI but her UA is positive and she has complains of weakness and increased frequency so I will start on Rocephin and send urine culture.   DISCHARGE CONDITIONS:   stable  CONSULTS OBTAINED:  Treatment Team:  Thana Farr, MD Hessie Knows, MD  DRUG ALLERGIES:   Allergies  Allergen Reactions  . Bee Venom Anaphylaxis  . Ciprofloxacin Nausea Only  . Penicillins Rash and Other (See Comments)    Has patient had a PCN reaction causing immediate rash, facial/tongue/throat swelling, SOB or lightheadedness with hypotension: no Has patient had a PCN reaction causing severe rash involving mucus membranes or skin necrosis: no Has patient had a PCN reaction that required hospitalization no Has patient had a PCN reaction occurring within the last 10 years: {yes If all of the above answers are "NO", then may proceed with Cephalosporin use.     DISCHARGE MEDICATIONS:   Allergies as of 03/10/2019  Reactions   Bee Venom Anaphylaxis   Ciprofloxacin Nausea Only   Penicillins Rash, Other (See Comments)   Has patient had a PCN reaction causing immediate rash, facial/tongue/throat swelling, SOB or lightheadedness with hypotension: no Has patient had a PCN reaction causing severe rash involving mucus membranes or skin necrosis: no Has patient had a PCN reaction that required hospitalization no Has patient had a PCN reaction  occurring within the last 10 years: {yes If all of the above answers are "NO", then may proceed with Cephalosporin use.      Medication List    STOP taking these medications   methocarbamol 500 MG tablet Commonly known as:  Robaxin   neomycin-polymyxin-hydrocortisone 3.5-10000-1 OTIC suspension Commonly known as:  CORTISPORIN   ondansetron 4 MG tablet Commonly known as:  Zofran     TAKE these medications   albuterol 108 (90 Base) MCG/ACT inhaler Commonly known as:  PROVENTIL HFA;VENTOLIN HFA Inhale 2 puffs into the lungs every 4 (four) hours as needed for wheezing or shortness of breath. What changed:  Another medication with the same name was removed. Continue taking this medication, and follow the directions you see here.   aspirin EC 81 MG tablet Take 1 tablet (81 mg total) by mouth 2 (two) times daily. For DVT prophylaxis for 30 days after surgery. What changed:    when to take this  additional instructions   atorvastatin 40 MG tablet Commonly known as:  LIPITOR Take 1 tablet (40 mg total) by mouth daily at 6 PM.   docusate sodium 100 MG capsule Commonly known as:  Colace Take 1 capsule (100 mg total) by mouth 2 (two) times daily. What changed:  additional instructions   famotidine 20 MG tablet Commonly known as:  PEPCID Take 1 tablet (20 mg total) by mouth 2 (two) times daily. For Gastroprotection   fluticasone 50 MCG/ACT nasal spray Commonly known as:  FLONASE Place 2 sprays into both nostrils daily.   furosemide 40 MG tablet Commonly known as:  LASIX Take 1 tablet (40 mg total) by mouth daily as needed for fluid. What changed:  when to take this   gabapentin 300 MG capsule Commonly known as:  Neurontin Take 1 capsule (300 mg total) by mouth 3 (three) times daily for 14 days. For 2 weeks post op for pain.   lacosamide 50 MG Tabs tablet Commonly known as:  VIMPAT Take 1 tablet (50 mg total) by mouth 2 (two) times daily.   mometasone-formoterol 100-5  MCG/ACT Aero Commonly known as:  DULERA Inhale 2 puffs into the lungs daily as needed for wheezing or shortness of breath.   sertraline 100 MG tablet Commonly known as:  ZOLOFT Take 2 tablets (200 mg total) by mouth daily.   tamsulosin 0.4 MG Caps capsule Commonly known as:  FLOMAX Take 1 capsule (0.4 mg total) by mouth daily.   venlafaxine XR 75 MG 24 hr capsule Commonly known as:  EFFEXOR-XR Take 1 capsule (75 mg total) by mouth daily with breakfast.     ASK your doctor about these medications   cefUROXime 250 MG tablet Commonly known as:  Ceftin Take 1 tablet (250 mg total) by mouth 2 (two) times daily for 3 days. Ask about: Should I take this medication?        DISCHARGE INSTRUCTIONS:   Follow wih PMD in 1-2 weeks.  If you experience worsening of your admission symptoms, develop shortness of breath, life threatening emergency, suicidal or homicidal thoughts you must seek medical  attention immediately by calling 911 or calling your MD immediately  if symptoms less severe.  You Must read complete instructions/literature along with all the possible adverse reactions/side effects for all the Medicines you take and that have been prescribed to you. Take any new Medicines after you have completely understood and accept all the possible adverse reactions/side effects.   Please note  You were cared for by a hospitalist during your hospital stay. If you have any questions about your discharge medications or the care you received while you were in the hospital after you are discharged, you can call the unit and asked to speak with the hospitalist on call if the hospitalist that took care of you is not available. Once you are discharged, your primary care physician will handle any further medical issues. Please note that NO REFILLS for any discharge medications will be authorized once you are discharged, as it is imperative that you return to your primary care physician (or establish a  relationship with a primary care physician if you do not have one) for your aftercare needs so that they can reassess your need for medications and monitor your lab values.    Today   CHIEF COMPLAINT:   Chief Complaint  Patient presents with  . Altered Mental Status  . Chest Pain    HISTORY OF PRESENT ILLNESS:  Nicole Wyatt  is a 53 y.o. female with a known history of *Acute left arm / leg weakness with altered mental status Acute stroke is ruled out by negative MRI.  CT angiogram head and neck is done without any abnormalities.  PT/OT/speech therapy to evaluate/treat, increase nursing care PRN,   treat hypoglycemia, seizure precautions, avoid unnecessary sedating agents, and continue close medical monitoring Appreciate help by neurologist, started on Vimpat for now and suggested to discharge on that.  *Acute possible recurrent seizure Note patient was taken off antiepileptic drugs 6 months ago by neurology/Dr.Shahas they were thought to be due to pseudoseizures Neurology suggested to start Vimpat and follow with out pt neuro.  *Acute hypoglycemia Continue D10, hypoglycemic protocol, Accu-Cheks every 2 hours , improved now.  blood sugar remains stable.  *Chronic hypoxic respiratory failure Secondary to COPD, sarcoid Stable Continue 2 L via nasal cannula continuous which is her home dose  *Chronic obstructive sleep apnea Stable CPAP at bedtime/as needed  *Chronic bipolar illness Continue home psychotropic regiment  called psych consult to help as there is no clear neurological reason for her weakness.  *Chronic morbid obesity Most likely secondary to excess calories Lifestyle modification recommended  *UTI She does not have much symptoms of UTI but her UA is positive and she has complains of weakness and increased frequency so I will start on Rocephin and send urine culture.    VITAL SIGNS:  Blood pressure (!) 105/52, pulse 68, temperature 98.6 F (37  C), temperature source Oral, resp. rate 16, height 5\' 7"  (1.702 m), weight 111.1 kg, SpO2 93 %.  I/O:  No intake or output data in the 24 hours ending 03/16/19 1104  PHYSICAL EXAMINATION:  GENERAL:  53 y.o.-year-old patient lying in the bed with no acute distress.  EYES: Pupils equal, round, reactive to light and accommodation. No scleral icterus. Extraocular muscles intact.  HEENT: Head atraumatic, normocephalic. Oropharynx and nasopharynx clear.  NECK:  Supple, no jugular venous distention. No thyroid enlargement, no tenderness.  LUNGS: Normal breath sounds bilaterally, no wheezing, rales,rhonchi or crepitation. No use of accessory muscles of respiration.  CARDIOVASCULAR: S1, S2 normal.  No murmurs, rubs, or gallops.  ABDOMEN: Soft, non-tender, non-distended. Bowel sounds present. No organomegaly or mass.  EXTREMITIES: No pedal edema, cyanosis, or clubbing.  NEUROLOGIC: Cranial nerves II through XII are intact. Muscle strength 5/5 in all extremities. Sensation intact. Gait not checked.  PSYCHIATRIC: The patient is alert and oriented x 3.  SKIN: No obvious rash, lesion, or ulcer.   DATA REVIEW:   CBC No results for input(s): WBC, HGB, HCT, PLT in the last 168 hours.  Chemistries  No results for input(s): NA, K, CL, CO2, GLUCOSE, BUN, CREATININE, CALCIUM, MG, AST, ALT, ALKPHOS, BILITOT in the last 168 hours.  Invalid input(s): GFRCGP  Cardiac Enzymes No results for input(s): TROPONINI in the last 168 hours.  Microbiology Results  Results for orders placed or performed during the hospital encounter of 03/07/19  Urine Culture     Status: Abnormal   Collection Time: 03/09/19  4:00 PM  Result Value Ref Range Status   Specimen Description   Final    URINE, RANDOM Performed at [facility], [address], [place], [place] [address]    Special Requests   Final    NONE Performed at [facility], [address] [place], [place] [address]    Culture >=100,000  COLONIES/mL PROTEUS MIRABILIS (A)  Final   Report Status 03/11/2019 FINAL  Final   Organism ID, Bacteria PROTEUS MIRABILIS (A)  Final      Susceptibility   Proteus mirabilis - MIC*    AMPICILLIN <=2 SENSITIVE Sensitive     CEFAZOLIN <=4 SENSITIVE Sensitive     CEFTRIAXONE <=1 SENSITIVE Sensitive     CIPROFLOXACIN <=0.25 SENSITIVE Sensitive     GENTAMICIN <=1 SENSITIVE Sensitive     IMIPENEM 1 SENSITIVE Sensitive     NITROFURANTOIN RESISTANT Resistant     TRIMETH/SULFA <=20 SENSITIVE Sensitive     AMPICILLIN/SULBACTAM <=2 SENSITIVE Sensitive     PIP/TAZO <=4 SENSITIVE Sensitive     * >=100,000 COLONIES/mL PROTEUS MIRABILIS    RADIOLOGY:  No results found.  EKG:   Orders placed or performed during the hospital encounter of 03/07/19  . EKG 12-Lead  . EKG 12-Lead  . ED EKG  . ED EKG  . EKG 12-Lead  . EKG 12-Lead      Management plans discussed with the patient, family and they are in agreement.  CODE STATUS:  Code Status History    Date Active Date Inactive Code Status Order ID Comments User Context   03/07/2019 2213 03/10/2019 2153 Full Code [id number]  Bertrum Sol, MD Inpatient   12/02/2018 1324 12/03/2018 1618 Full Code [id number]  Rudean Hitt, PA-C Inpatient      TOTAL TIME TAKING CARE OF THIS PATIENT: 35 minutes.    Altamese Dilling M.D on 03/16/2019 at 11:04 AM  Between 7am to 6pm - Pager - [phone]  After 6pm go to [email or web address] - password [email or web address]  [facility]  Office  [phone]  CC: Primary care physician; Carlean Jews, NP   Note: This dictation was prepared with Dragon dictation along with smaller phrase technology. Any transcriptional errors that result from this process are unintentional.

## 2019-03-17 ENCOUNTER — Ambulatory Visit: Payer: Medicare Other | Admitting: Adult Health

## 2019-03-30 ENCOUNTER — Encounter: Payer: Self-pay | Admitting: Internal Medicine

## 2019-03-30 ENCOUNTER — Ambulatory Visit (INDEPENDENT_AMBULATORY_CARE_PROVIDER_SITE_OTHER): Payer: Medicare Other | Admitting: Internal Medicine

## 2019-03-30 ENCOUNTER — Other Ambulatory Visit: Payer: Self-pay

## 2019-03-30 ENCOUNTER — Telehealth: Payer: Self-pay

## 2019-03-30 DIAGNOSIS — J301 Allergic rhinitis due to pollen: Secondary | ICD-10-CM | POA: Diagnosis not present

## 2019-03-30 DIAGNOSIS — J101 Influenza due to other identified influenza virus with other respiratory manifestations: Secondary | ICD-10-CM | POA: Diagnosis not present

## 2019-03-30 DIAGNOSIS — J4520 Mild intermittent asthma, uncomplicated: Secondary | ICD-10-CM

## 2019-03-30 MED ORDER — FLUTICASONE PROPIONATE HFA 110 MCG/ACT IN AERO: INHALATION_SPRAY | RESPIRATORY_TRACT | 3 refills | Status: DC

## 2019-03-30 MED ORDER — AZITHROMYCIN 250 MG PO TABS: ORAL_TABLET | ORAL | 0 refills | Status: DC

## 2019-03-30 NOTE — Progress Notes (Signed)
[facility] [address] [place], [place] [address]  Internal MEDICINE  Telephone Visit  Patient Name: Nicole Wyatt  [id number]  [id number]  Date of Service: 04/05/2019  I connected with the patient at 1120 by telephone and verified the patients identity using two identifiers.   I discussed the limitations, risks, security and privacy concerns of performing an evaluation and management service by telephone and the availability of in person appointments. I also discussed with the patient that there may be a patient responsible charge related to the service.  The patient expressed understanding and agrees to proceed.    Chief Complaint  Patient presents with  . Telephone Screen  . Cough    coughing up green mucus,rib pain and pt had few weeks ago Flu positive since than she having all this symtoms     HPI Pt is c/o chest congestion, slight sob. No fever or chills. Slight fatigue. She does have h/o asthma and allergies   Current Medication: Outpatient Encounter Medications as of 03/30/2019  Medication Sig  . albuterol (PROVENTIL HFA;VENTOLIN HFA) 108 (90 Base) MCG/ACT inhaler Inhale 2 puffs into the lungs every 4 (four) hours as needed for wheezing or shortness of breath.  Marland Kitchen aspirin EC 81 MG tablet Take 1 tablet (81 mg total) by mouth 2 (two) times daily. For DVT prophylaxis for 30 days after surgery. (Patient taking differently: Take 81 mg by mouth daily. )  . docusate sodium (COLACE) 100 MG capsule Take 1 capsule (100 mg total) by mouth 2 (two) times daily.  . fluticasone (FLONASE) 50 MCG/ACT nasal spray Place 2 sprays into both nostrils daily.  . furosemide (LASIX) 40 MG tablet Take 1 tablet (40 mg total) by mouth daily as needed for fluid. (Patient taking differently: Take 40 mg by mouth daily. )  . mometasone-formoterol (DULERA) 100-5 MCG/ACT AERO Inhale 2 puffs into the lungs daily as needed for wheezing or shortness of breath.  . sertraline (ZOLOFT) 100 MG tablet Take  2 tablets (200 mg total) by mouth daily.  Marland Kitchen venlafaxine XR (EFFEXOR-XR) 75 MG 24 hr capsule Take 1 capsule (75 mg total) by mouth daily with breakfast.  . atorvastatin (LIPITOR) 40 MG tablet Take 1 tablet (40 mg total) by mouth daily at 6 PM. (Patient not taking: Reported on 03/30/2019)  . azithromycin (ZITHROMAX) 250 MG tablet Take one tab po qd chest congestion  . famotidine (PEPCID) 20 MG tablet Take 1 tablet (20 mg total) by mouth 2 (two) times daily. For Gastroprotection  . fluticasone (FLOVENT HFA) 110 MCG/ACT inhaler Inhale 2 puffs 2 x day for copd. Rinse mouth afterwards  . gabapentin (NEURONTIN) 300 MG capsule Take 1 capsule (300 mg total) by mouth 3 (three) times daily for 14 days. For 2 weeks post op for pain.  Marland Kitchen lacosamide (VIMPAT) 50 MG TABS tablet Take 1 tablet (50 mg total) by mouth 2 (two) times daily. (Patient not taking: Reported on 03/30/2019)  . tamsulosin (FLOMAX) 0.4 MG CAPS capsule Take 1 capsule (0.4 mg total) by mouth daily. (Patient not taking: Reported on 03/07/2019)   No facility-administered encounter medications on file as of 03/30/2019.     Surgical History: Past Surgical History:  Procedure Laterality Date  . ABDOMINAL HYSTERECTOMY  1999  . BLADDER SURGERY    . CARDIAC CATHETERIZATION Bilateral 05/29/2016   Procedure: Right/Left Heart Cath and Coronary Angiography;  Surgeon: Lamar Blinks, MD;  Location: [facility] INVASIVE CV LAB;  Service: Cardiovascular;  Laterality: Bilateral;  . COLONOSCOPY    .  COLONOSCOPY N/A 07/19/2015   Procedure: COLONOSCOPY;  Surgeon: Earline MayotteJeffrey W Byrnett, MD;  [facility];  Service: Endoscopy;  Laterality: N/A;  . FLEXIBLE BRONCHOSCOPY N/A 07/27/2016   Procedure: FLEXIBLE BRONCHOSCOPY;  Surgeon: Yevonne PaxSaadat A Sheri Gatchel, MD;  Location: [facility] ORS;  Service: Pulmonary;  Laterality: N/A;  . FOOT SURGERY    . KIDNEY STONE SURGERY  7 years  . TOTAL HIP ARTHROPLASTY Left 12/02/2018   Procedure: TOTAL HIP ARTHROPLASTY ANTERIOR APPROACH;  Surgeon:  Sheral ApleyMurphy, Timothy D, MD;  Location: [facility] ORS;  Service: Orthopedics;  Laterality: Left;  . TUBAL LIGATION      Medical History: Past Medical History:  Diagnosis Date  . Abnormal Pap smear of cervix   . Anxiety   . Arthritis   . Bipolar 1 disorder (HCC)   . COPD (chronic obstructive pulmonary disease) (HCC)   . Depression   . Dyspnea    with exertion   . Kidney stone   . Oxygen dependent    patient uses 2L oz PRN ; reports hardly ever uses it unless very SOB    . Rocky Mountain spotted fever 2018  . Sarcoidosis of lung (HCC)    managed by Dr Lindie SpruceSaddat   . Seizures (HCC) last seizure 05/2013  . Sleep apnea    uses CPAP nightly   . UTI (urinary tract infection) 11/13/2018   see ED visit in epic ; was dc'd with oral abx and reports completed and relief of sx     Family History: Family History  Problem Relation Age of Onset  . Hypertension Mother   . Arthritis Mother   . Hypertension Father   . Arthritis Father   . Prostate cancer Father   . Ovarian cancer Maternal Grandmother     Social History   Socioeconomic History  . Marital status: Single    Spouse name: Not on file  . Number of children: Not on file  . Years of education: Not on file  . Highest education level: Not on file  Occupational History  . Not on file  Social Needs  . Financial resource strain: Not on file  . Food insecurity:    Worry: Not on file    Inability: Not on file  . Transportation needs:    Medical: Not on file    Non-medical: Not on file  Tobacco Use  . Smoking status: Current Every Day Smoker    Packs/day: 0.50    Years: 14.00    Pack years: 7.00    Types: Cigarettes    Last attempt to quit: 05/07/2016    Years since quitting: 2.9  . Smokeless tobacco: Never Used  . Tobacco comment: 11-25-18 reports  down to just 1 cigarette per day   Substance and Sexual Activity  . Alcohol use: Yes    Alcohol/week: 0.0 standard drinks    Comment: seldom   . Drug use: No  . Sexual activity: Never   Lifestyle  . Physical activity:    Days per week: Not on file    Minutes per session: Not on file  . Stress: Not on file  Relationships  . Social connections:    Talks on phone: Not on file    Gets together: Not on file    Attends religious service: Not on file    Active member of club or organization: Not on file    Attends meetings of clubs or organizations: Not on file    Relationship status: Not on file  . Intimate partner  violence:    Fear of current or ex partner: Not on file    Emotionally abused: Not on file    Physically abused: Not on file    Forced sexual activity: Not on file  Other Topics Concern  . Not on file  Social History Narrative  . Not on file    Review of Systems  Constitutional: Negative for chills, diaphoresis and fatigue.  HENT: Negative for ear pain, postnasal drip and sinus pressure.   Eyes: Negative for photophobia, discharge, redness, itching and visual disturbance.  Respiratory: Negative for cough, shortness of breath and wheezing.   Cardiovascular: Negative for chest pain, palpitations and leg swelling.  Gastrointestinal: Negative for abdominal pain, constipation, diarrhea, nausea and vomiting.  Genitourinary: Negative for dysuria and flank pain.  Musculoskeletal: Negative for arthralgias, back pain, gait problem and neck pain.  Skin: Negative for color change.  Allergic/Immunologic: Negative for environmental allergies and food allergies.  Neurological: Negative for dizziness and headaches.  Hematological: Does not bruise/bleed easily.  Psychiatric/Behavioral: Negative for agitation, behavioral problems (depression) and hallucinations.    Vital Signs: There were no vitals taken for this visit.   Observation/Objective:  Pt seems to be awake and alert, chest is congested, nasal congestion is present as well  Assessment/Plan: 1. Influenza A - Finished treatment already, will monitor  2. Allergic rhinitis due to pollen, unspecified  seasonality - fluticasone (FLOVENT HFA) 110 MCG/ACT inhaler; Inhale 2 puffs 2 x day for copd. Rinse mouth afterwards  Dispense: 1 Inhaler; Refill: 3  3. Mild intermittent asthma without complication - Continue albuterol  - azithromycin (ZITHROMAX) 250 MG tablet; Take one tab po qd chest congestion  Dispense: 10 tablet; Refill: 0  General Counseling: Nicole Wyatt verbalizes understanding of the findings of today's phone visit and agrees with plan of treatment. I have discussed any further diagnostic evaluation that may be needed or ordered today. We also reviewed her medications today. she has been encouraged to call the office with any questions or concerns that should arise related to todays visit.    No orders of the defined types were placed in this encounter.   Meds ordered this encounter  Medications  . fluticasone (FLOVENT HFA) 110 MCG/ACT inhaler    Sig: Inhale 2 puffs 2 x day for copd. Rinse mouth afterwards    Dispense:  1 Inhaler    Refill:  3  . azithromycin (ZITHROMAX) 250 MG tablet    Sig: Take one tab po qd chest congestion    Dispense:  10 tablet    Refill:  0    Time spent:14 Minutes    Dr Lyndon Code Internal medicine

## 2019-03-30 NOTE — Telephone Encounter (Signed)
Pt advised we send zpak and inhaler to [facility] if she is not feeling better need to been seen

## 2019-06-24 ENCOUNTER — Other Ambulatory Visit: Payer: Self-pay

## 2019-06-24 DIAGNOSIS — F331 Major depressive disorder, recurrent, moderate: Secondary | ICD-10-CM

## 2019-06-24 MED ORDER — VENLAFAXINE HCL ER 75 MG PO CP24: 75.0000 mg | ORAL_CAPSULE | Freq: Every day | ORAL | 0 refills | Status: DC

## 2019-06-24 MED ORDER — SERTRALINE HCL 100 MG PO TABS: 200.0000 mg | ORAL_TABLET | Freq: Every day | ORAL | 0 refills | Status: DC

## 2019-07-10 ENCOUNTER — Other Ambulatory Visit: Payer: Self-pay

## 2019-07-10 ENCOUNTER — Ambulatory Visit (INDEPENDENT_AMBULATORY_CARE_PROVIDER_SITE_OTHER): Payer: Medicare Other | Admitting: Nurse Practitioner

## 2019-07-10 VITALS — BP 134/76 | HR 71 | Resp 16 | Ht 67.0 in | Wt 241.8 lb

## 2019-07-10 DIAGNOSIS — E162 Hypoglycemia, unspecified: Secondary | ICD-10-CM | POA: Diagnosis not present

## 2019-07-10 DIAGNOSIS — R5383 Other fatigue: Secondary | ICD-10-CM | POA: Diagnosis not present

## 2019-07-10 DIAGNOSIS — Z0001 Encounter for general adult medical examination with abnormal findings: Secondary | ICD-10-CM

## 2019-07-10 DIAGNOSIS — Z1239 Encounter for other screening for malignant neoplasm of breast: Secondary | ICD-10-CM

## 2019-07-10 DIAGNOSIS — F331 Major depressive disorder, recurrent, moderate: Secondary | ICD-10-CM

## 2019-07-10 MED ORDER — SERTRALINE HCL 100 MG PO TABS: 100.0000 mg | ORAL_TABLET | Freq: Every day | ORAL | 2 refills | Status: DC

## 2019-07-10 MED ORDER — VENLAFAXINE HCL ER 75 MG PO CP24: 150.0000 mg | ORAL_CAPSULE | Freq: Every day | ORAL | 2 refills | Status: DC

## 2019-07-10 NOTE — Progress Notes (Signed)
[facility] [place], [place] [address]  Internal MEDICINE  Office Visit Note  Patient Name: Nicole Wyatt  [id number]  [id number]  Date of Service: 07/19/2019   Pt is here for routine health maintenance examination   Chief Complaint  Patient presents with  . Medicare Wellness    medication refills, pt have some concerns about blodd sugar levels  . Arthritis  . Anxiety  . Depression    pt is wanting to up dosage on anti-depressants, both parents are sick and mom is on hospice, pt is taking care of both parents   . Quality Metric Gaps    mammogram due     The patient is here for health maintenance exam. She is having some increased anxiety and depression. She is currently taking care of both of her parents who are chronically ill. She states this is taking up a lot of current energy. She feels like sertraline makes her very sleepy and sluggish. She also takes effexor. She is concerned about weight gain. Feels like weight gain is contributing to her increased fatigue and depression. She is otherwise feeling well. She is due to have screening mammogram.    Current Medication: Outpatient Encounter Medications as of 07/10/2019  Medication Sig  . albuterol (PROVENTIL HFA;VENTOLIN HFA) 108 (90 Base) MCG/ACT inhaler Inhale 2 puffs into the lungs every 4 (four) hours as needed for wheezing or shortness of breath.  Marland Kitchen aspirin EC 81 MG tablet Take 1 tablet (81 mg total) by mouth 2 (two) times daily. For DVT prophylaxis for 30 days after surgery. (Patient taking differently: Take 81 mg by mouth daily. )  . docusate sodium (COLACE) 100 MG capsule Take 1 capsule (100 mg total) by mouth 2 (two) times daily.  . fluticasone (FLONASE) 50 MCG/ACT nasal spray Place 2 sprays into both nostrils daily.  . fluticasone (FLOVENT HFA) 110 MCG/ACT inhaler Inhale 2 puffs 2 x day for copd. Rinse mouth afterwards  . furosemide (LASIX) 40 MG tablet Take 1 tablet (40 mg total) by mouth  daily as needed for fluid. (Patient taking differently: Take 40 mg by mouth daily. )  . mometasone-formoterol (DULERA) 100-5 MCG/ACT AERO Inhale 2 puffs into the lungs daily as needed for wheezing or shortness of breath.  . sertraline (ZOLOFT) 100 MG tablet Take 1 tablet (100 mg total) by mouth daily.  Marland Kitchen venlafaxine XR (EFFEXOR-XR) 75 MG 24 hr capsule Take 2 capsules (150 mg total) by mouth daily with breakfast.  . [DISCONTINUED] azithromycin (ZITHROMAX) 250 MG tablet Take one tab po qd chest congestion  . [DISCONTINUED] sertraline (ZOLOFT) 100 MG tablet Take 2 tablets (200 mg total) by mouth daily.  . [DISCONTINUED] venlafaxine XR (EFFEXOR-XR) 75 MG 24 hr capsule Take 1 capsule (75 mg total) by mouth daily with breakfast.  . atorvastatin (LIPITOR) 40 MG tablet Take 1 tablet (40 mg total) by mouth daily at 6 PM. (Patient not taking: Reported on 03/30/2019)  . famotidine (PEPCID) 20 MG tablet Take 1 tablet (20 mg total) by mouth 2 (two) times daily. For Gastroprotection  . gabapentin (NEURONTIN) 300 MG capsule Take 1 capsule (300 mg total) by mouth 3 (three) times daily for 14 days. For 2 weeks post op for pain.  Marland Kitchen lacosamide (VIMPAT) 50 MG TABS tablet Take 1 tablet (50 mg total) by mouth 2 (two) times daily. (Patient not taking: Reported on 03/30/2019)  . tamsulosin (FLOMAX) 0.4 MG CAPS capsule Take 1 capsule (0.4 mg total) by mouth daily. (  Patient not taking: Reported on 03/07/2019)   No facility-administered encounter medications on file as of 07/10/2019.     Surgical History: Past Surgical History:  Procedure Laterality Date  . ABDOMINAL HYSTERECTOMY  1999  . BLADDER SURGERY    . CARDIAC CATHETERIZATION Bilateral 05/29/2016   Procedure: Right/Left Heart Cath and Coronary Angiography;  Surgeon: Lamar BlinksBruce J Kowalski, MD;  Location: [facility] INVASIVE CV LAB;  Service: Cardiovascular;  Laterality: Bilateral;  . COLONOSCOPY    . COLONOSCOPY N/A 07/19/2015   Procedure: COLONOSCOPY;  Surgeon: Earline MayotteJeffrey W Byrnett,  MD;  [facility];  Service: Endoscopy;  Laterality: N/A;  . FLEXIBLE BRONCHOSCOPY N/A 07/27/2016   Procedure: FLEXIBLE BRONCHOSCOPY;  Surgeon: Yevonne PaxSaadat A Khan, MD;  Location: [facility] ORS;  Service: Pulmonary;  Laterality: N/A;  . FOOT SURGERY    . KIDNEY STONE SURGERY  7 years  . TOTAL HIP ARTHROPLASTY Left 12/02/2018   Procedure: TOTAL HIP ARTHROPLASTY ANTERIOR APPROACH;  Surgeon: Sheral ApleyMurphy, Timothy D, MD;  Location: [facility] ORS;  Service: Orthopedics;  Laterality: Left;  . TUBAL LIGATION      Medical History: Past Medical History:  Diagnosis Date  . Abnormal Pap smear of cervix   . Anxiety   . Arthritis   . Bipolar 1 disorder (HCC)   . COPD (chronic obstructive pulmonary disease) (HCC)   . Depression   . Dyspnea    with exertion   . Kidney stone   . Oxygen dependent    patient uses 2L oz PRN ; reports hardly ever uses it unless very SOB    . Rocky Mountain spotted fever 2018  . Sarcoidosis of lung (HCC)    managed by Dr Lindie SpruceSaddat   . Seizures (HCC) last seizure 05/2013  . Sleep apnea    uses CPAP nightly   . UTI (urinary tract infection) 11/13/2018   see ED visit in epic ; was dc'd with oral abx and reports completed and relief of sx     Family History: Family History  Problem Relation Age of Onset  . Hypertension Mother   . Arthritis Mother   . Hypertension Father   . Arthritis Father   . Prostate cancer Father   . Ovarian cancer Maternal Grandmother       Review of Systems  Constitutional: Positive for fatigue. Negative for chills and diaphoresis.  HENT: Negative for ear pain, postnasal drip and sinus pressure.   Respiratory: Negative for cough, shortness of breath and wheezing.   Cardiovascular: Negative for chest pain and palpitations.  Gastrointestinal: Negative for abdominal pain, constipation, diarrhea, nausea and vomiting.  Endocrine: Negative for cold intolerance, heat intolerance, polydipsia and polyuria.  Musculoskeletal: Negative for arthralgias, back  pain, gait problem and neck pain.  Skin: Negative for color change.  Allergic/Immunologic: Negative for environmental allergies and food allergies.  Neurological: Negative for dizziness and headaches.  Hematological: Does not bruise/bleed easily.  Psychiatric/Behavioral: Positive for dysphoric mood. Negative for agitation, behavioral problems (depression) and hallucinations. The patient is nervous/anxious.      Today's Vitals   07/10/19 1430  BP: 134/76  Pulse: 71  Resp: 16  Weight: 241 lb 12.8 oz (109.7 kg)  Height: 5\' 7"  (1.702 m)   Body mass index is 37.87 kg/m.  Physical Exam Vitals signs and nursing note reviewed.  Constitutional:      General: She is not in acute distress.    Appearance: Normal appearance. She is well-developed. She is not diaphoretic.  HENT:     Head: Normocephalic and atraumatic.  Nose: Nose normal.     Mouth/Throat:     Pharynx: No oropharyngeal exudate.  Eyes:     Conjunctiva/sclera: Conjunctivae normal.     Pupils: Pupils are equal, round, and reactive to light.  Neck:     Musculoskeletal: Normal range of motion and neck supple.     Thyroid: No thyromegaly.     Vascular: No carotid bruit or JVD.     Trachea: No tracheal deviation.  Cardiovascular:     Rate and Rhythm: Normal rate and regular rhythm.     Pulses: Normal pulses.     Heart sounds: Normal heart sounds. No murmur. No friction rub. No gallop.   Pulmonary:     Effort: Pulmonary effort is normal. No respiratory distress.     Breath sounds: Normal breath sounds. No wheezing or rales.  Chest:     Chest wall: No tenderness.     Breasts:        Right: No inverted nipple, mass, nipple discharge, skin change or tenderness.        Left: No inverted nipple, mass, nipple discharge, skin change or tenderness.  Abdominal:     General: Bowel sounds are normal.     Palpations: Abdomen is soft.     Tenderness: There is no abdominal tenderness. There is no guarding.  Genitourinary:     Vagina: Normal.     Comments: No masses, tenderness, or organomegaly present during bimanual exam.  Musculoskeletal: Normal range of motion.     Comments: Moderate left hip pain. Putting weight on the left leg increases pain. ROM and strength are diminished. She does walk with considerable limp, favoring the left side.   Lymphadenopathy:     Cervical: No cervical adenopathy.  Skin:    General: Skin is warm and dry.     Capillary Refill: Capillary refill takes 2 to 3 seconds.  Neurological:     Mental Status: She is alert and oriented to person, place, and time.     Cranial Nerves: No cranial nerve deficit.  Psychiatric:        Attention and Perception: Attention and perception normal.        Mood and Affect: Mood is anxious and depressed.        Speech: Speech normal.        Behavior: Behavior normal. Behavior is cooperative.        Thought Content: Thought content normal.        Cognition and Memory: Cognition and memory normal.        Judgment: Judgment normal.    Depression screen [facility] 2/9 01/06/2019 06/23/2018 03/25/2018  Decreased Interest 0 0 3  Down, Depressed, Hopeless 0 0 3  PHQ - 2 Score 0 0 6  Altered sleeping - - 0  Tired, decreased energy - - 3  Change in appetite - - 3  Feeling bad or failure about yourself  - - 1  Trouble concentrating - - 3  Moving slowly or fidgety/restless - - 0  Suicidal thoughts - - 0  PHQ-9 Score - - 16  Difficult doing work/chores - - Very difficult    Functional Status Survey: Is the patient deaf or have difficulty hearing?: No Does the patient have difficulty seeing, even when wearing glasses/contacts?: No Does the patient have difficulty concentrating, remembering, or making decisions?: No Does the patient have difficulty walking or climbing stairs?: No Does the patient have difficulty dressing or bathing?: No Does the patient have difficulty doing errands alone such  as visiting a doctor's office or shopping?: No  MMSE - Mini Mental  State Exam 07/10/2019 06/23/2018  Orientation to time 5 5  Orientation to Place 5 5  Registration 3 3  Attention/ Calculation 5 5  Recall 3 3  Language- name 2 objects 2 2  Language- repeat 1 1  Language- follow 3 step command 3 3  Language- read & follow direction 1 1  Write a sentence 1 1  Copy design 1 1  Total score 30 30    Fall Risk  07/10/2019 01/06/2019 06/23/2018 03/25/2018  Falls in the past year? 0 0 No No  Number falls in past yr: - 0 - -  Injury with Fall? - 0 - -   Assessment/Plan: 1. Encounter for general adult medical examination with abnormal findings Annual health maintenance exam today.   2. Other fatigue Likely due to increased depression. Medication changes made today.   3. Depression, major, recurrent, moderate (HCC) Decrease sertraline to 100mg  tablets every evening. Increase venlafaxine Er to 75mg  twice daily. Reassess at next visit.   - sertraline (ZOLOFT) 100 MG tablet; Take 1 tablet (100 mg total) by mouth daily.  Dispense: 30 tablet; Refill: 2 - venlafaxine XR (EFFEXOR-XR) 75 MG 24 hr capsule; Take 2 capsules (150 mg total) by mouth daily with breakfast.  Dispense: 60 capsule; Refill: 2  4. Hypoglycemia Blood sugars stable.   5. Screening for breast cancer - MM DIGITAL SCREENING BILATERAL; Future  General Counseling: Jadea verbalizes understanding of the findings of todays visit and agrees with plan of treatment. I have discussed any further diagnostic evaluation that may be needed or ordered today. We also reviewed her medications today. she has been encouraged to call the office with any questions or concerns that should arise related to todays visit.    Counseling:  This patient was seen by Vincent GrosHeather Dmarco Baldus FNP Collaboration with Dr Lyndon CodeFozia M Khan as a part of collaborative care agreement  Orders Placed This Encounter  Procedures  . MM DIGITAL SCREENING BILATERAL    Meds ordered this encounter  Medications  . sertraline (ZOLOFT) 100 MG tablet     Sig: Take 1 tablet (100 mg total) by mouth daily.    Dispense:  30 tablet    Refill:  2    Please note decreased dose    Order Specific Question:   Supervising Provider    Answer:   Lyndon CodeKHAN, FOZIA M [[id number]]  . venlafaxine XR (EFFEXOR-XR) 75 MG 24 hr capsule    Sig: Take 2 capsules (150 mg total) by mouth daily with breakfast.    Dispense:  60 capsule    Refill:  2    Please note increased dose    Order Specific Question:   Supervising Provider    Answer:   Lyndon CodeKHAN, FOZIA M [[id number]]    Time spent: 10830 Minutes      Lyndon CodeFozia M Khan, MD  Internal Medicine

## 2019-07-13 ENCOUNTER — Telehealth: Payer: Self-pay

## 2019-07-13 NOTE — Telephone Encounter (Signed)
Please admise her to cut sertraline to one tablet daily. After she has done that for several days, she can wean off of sertraline gradually. Should take 1 tablet every other day for a week, then every third day for a week, then can stop it. Continue with venlafaxine twice daily. Thanks.

## 2019-07-13 NOTE — Telephone Encounter (Signed)
Pt advised she can take sertraline every other ay for 1 week and they every third day for week  And then stop and continue venlafaxine

## 2019-07-15 DIAGNOSIS — S93602A Unspecified sprain of left foot, initial encounter: Secondary | ICD-10-CM | POA: Diagnosis not present

## 2019-07-19 DIAGNOSIS — R5383 Other fatigue: Secondary | ICD-10-CM | POA: Insufficient documentation

## 2019-07-19 DIAGNOSIS — E162 Hypoglycemia, unspecified: Secondary | ICD-10-CM | POA: Insufficient documentation

## 2019-07-20 ENCOUNTER — Other Ambulatory Visit: Payer: Self-pay | Admitting: Nurse Practitioner

## 2019-07-20 DIAGNOSIS — R5383 Other fatigue: Secondary | ICD-10-CM | POA: Diagnosis not present

## 2019-07-20 DIAGNOSIS — E162 Hypoglycemia, unspecified: Secondary | ICD-10-CM | POA: Diagnosis not present

## 2019-07-20 DIAGNOSIS — E559 Vitamin D deficiency, unspecified: Secondary | ICD-10-CM | POA: Diagnosis not present

## 2019-07-21 LAB — COMPREHENSIVE METABOLIC PANEL
ALT: 10 IU/L (ref 0–32)
AST: 16 IU/L (ref 0–40)
Albumin/Globulin Ratio: 1.4 (ref 1.2–2.2)
Albumin: 4.4 g/dL (ref 3.8–4.9)
Alkaline Phosphatase: 84 IU/L (ref 39–117)
BUN/Creatinine Ratio: 13 (ref 9–23)
BUN: 10 mg/dL (ref 6–24)
Bilirubin Total: 0.3 mg/dL (ref 0.0–1.2)
CO2: 23 mmol/L (ref 20–29)
Calcium: 9.2 mg/dL (ref 8.7–10.2)
Chloride: 102 mmol/L (ref 96–106)
Creatinine, Ser: 0.75 mg/dL (ref 0.57–1.00)
GFR calc Af Amer: 104 mL/min/{1.73_m2} (ref >59)
GFR calc non Af Amer: 90 mL/min/{1.73_m2} (ref >59)
Globulin, Total: 3.1 g/dL (ref 1.5–4.5)
Glucose: 89 mg/dL (ref 65–99)
Potassium: 4.1 mmol/L (ref 3.5–5.2)
Sodium: 141 mmol/L (ref 134–144)
Total Protein: 7.5 g/dL (ref 6.0–8.5)

## 2019-07-21 LAB — CBC
Hematocrit: 40.2 % (ref 34.0–46.6)
Hemoglobin: 13.5 g/dL (ref 11.1–15.9)
MCH: 30.8 pg (ref 26.6–33.0)
MCHC: 33.6 g/dL (ref 31.5–35.7)
MCV: 92 fL (ref 79–97)
Platelets: 275 10*3/uL (ref 150–450)
RBC: 4.38 x10E6/uL (ref 3.77–5.28)
RDW: 15.8 % — ABNORMAL HIGH (ref 11.7–15.4)
WBC: 5.5 10*3/uL (ref 3.4–10.8)

## 2019-07-21 LAB — T3: T3, Total: 127 ng/dL (ref 71–180)

## 2019-07-21 LAB — TSH: TSH: 2.04 u[IU]/mL (ref 0.450–4.500)

## 2019-07-21 LAB — HGB A1C W/O EAG: Hgb A1c MFr Bld: 5.1 % (ref 4.8–5.6)

## 2019-07-21 LAB — T4, FREE: Free T4: 1.27 ng/dL (ref 0.82–1.77)

## 2019-07-21 LAB — VITAMIN D 25 HYDROXY (VIT D DEFICIENCY, FRACTURES): Vit D, 25-Hydroxy: 13.5 ng/mL — ABNORMAL LOW (ref 30.0–100.0)

## 2019-07-25 DIAGNOSIS — L03213 Periorbital cellulitis: Secondary | ICD-10-CM | POA: Diagnosis not present

## 2019-07-27 DIAGNOSIS — L03213 Periorbital cellulitis: Secondary | ICD-10-CM | POA: Diagnosis not present

## 2019-08-05 NOTE — Progress Notes (Signed)
Will need vitamin d. Other labs good. Discuss at visit 08/21/2019

## 2019-08-21 ENCOUNTER — Ambulatory Visit: Payer: Medicare Other | Admitting: Nurse Practitioner

## 2019-08-27 ENCOUNTER — Ambulatory Visit (INDEPENDENT_AMBULATORY_CARE_PROVIDER_SITE_OTHER): Payer: Medicare Other | Admitting: Nurse Practitioner

## 2019-08-27 ENCOUNTER — Other Ambulatory Visit: Payer: Self-pay

## 2019-08-27 ENCOUNTER — Encounter: Payer: Self-pay | Admitting: Nurse Practitioner

## 2019-08-27 VITALS — BP 112/59 | HR 78 | Temp 98.4°F | Resp 16 | Ht 66.0 in | Wt 240.8 lb

## 2019-08-27 DIAGNOSIS — N390 Urinary tract infection, site not specified: Secondary | ICD-10-CM | POA: Diagnosis not present

## 2019-08-27 DIAGNOSIS — R300 Dysuria: Secondary | ICD-10-CM

## 2019-08-27 DIAGNOSIS — N3001 Acute cystitis with hematuria: Secondary | ICD-10-CM | POA: Diagnosis not present

## 2019-08-27 DIAGNOSIS — L209 Atopic dermatitis, unspecified: Secondary | ICD-10-CM

## 2019-08-27 DIAGNOSIS — M064 Inflammatory polyarthropathy: Secondary | ICD-10-CM

## 2019-08-27 DIAGNOSIS — H60503 Unspecified acute noninfective otitis externa, bilateral: Secondary | ICD-10-CM

## 2019-08-27 LAB — POCT URINALYSIS DIPSTICK
Bilirubin, UA: NEGATIVE
Glucose, UA: NEGATIVE
Ketones, UA: NEGATIVE
Nitrite, UA: POSITIVE
Protein, UA: POSITIVE — AB
Spec Grav, UA: 1.015 (ref 1.010–1.025)
Urobilinogen, UA: 0.2 E.U./dL
pH, UA: 6 (ref 5.0–8.0)

## 2019-08-27 MED ORDER — TRIAMCINOLONE ACETONIDE 0.025 % EX CREA: 1.0000 "application " | TOPICAL_CREAM | Freq: Two times a day (BID) | CUTANEOUS | 2 refills | Status: DC

## 2019-08-27 MED ORDER — SULFAMETHOXAZOLE-TRIMETHOPRIM 800-160 MG PO TABS: 1.0000 | ORAL_TABLET | Freq: Two times a day (BID) | ORAL | 0 refills | Status: DC

## 2019-08-27 MED ORDER — NEOMYCIN-POLYMYXIN-HC 3.5-10000-1 OP SUSP: OPHTHALMIC | 0 refills | Status: DC

## 2019-08-27 MED ORDER — MELOXICAM 7.5 MG PO TABS: 7.5000 mg | ORAL_TABLET | Freq: Every day | ORAL | 0 refills | Status: DC

## 2019-08-27 NOTE — Progress Notes (Signed)
[facility] [place], [place] [address]  Internal MEDICINE  Office Visit Note  Patient Name: Nicole Wyatt  [id number]  [id number]  Date of Service: 09/08/2019   Pt is here for a sick visit.   Chief Complaint  Patient presents with  . Ear Drainage  . Skin Discoloration  . Urinary Tract Infection    burning sensation     The patient states that she is having a clear/white drainage from the left ear. Ear throbs. Has been going on for about two weeks. She also has burning and pain with urination. She states that the symptoms started about a week ago. She states that her father passed away on April 05, 2023, and people were bringing food and drinks to comfort the family. She has been drinking soda and lots of tea. She feels that uti started about this time.  She is also having some itching of the skin, especially of the arms and legs. She does have history of sarcoid and feels as though these symptoms are related to that.        Current Medication:  Outpatient Encounter Medications as of 08/27/2019  Medication Sig  . albuterol (PROVENTIL HFA;VENTOLIN HFA) 108 (90 Base) MCG/ACT inhaler Inhale 2 puffs into the lungs every 4 (four) hours as needed for wheezing or shortness of breath.  Marland Kitchen aspirin EC 81 MG tablet Take 1 tablet (81 mg total) by mouth 2 (two) times daily. For DVT prophylaxis for 30 days after surgery. (Patient taking differently: Take 81 mg by mouth daily. )  . fluticasone (FLONASE) 50 MCG/ACT nasal spray Place 2 sprays into both nostrils daily.  . fluticasone (FLOVENT HFA) 110 MCG/ACT inhaler Inhale 2 puffs 2 x day for copd. Rinse mouth afterwards  . furosemide (LASIX) 40 MG tablet Take 1 tablet (40 mg total) by mouth daily as needed for fluid. (Patient taking differently: Take 40 mg by mouth daily. )  . mometasone-formoterol (DULERA) 100-5 MCG/ACT AERO Inhale 2 puffs into the lungs daily as needed for wheezing or shortness of breath.  . venlafaxine XR  (EFFEXOR-XR) 75 MG 24 hr capsule Take 2 capsules (150 mg total) by mouth daily with breakfast.  . meloxicam (MOBIC) 7.5 MG tablet Take 1 tablet (7.5 mg total) by mouth daily.  Marland Kitchen neomycin-polymyxin-hydrocortisone (CORTISPORIN) 3.5-10000-1 ophthalmic suspension Insert four drops in both ears twice daily for seven days.  . nitrofurantoin, macrocrystal-monohydrate, (MACROBID) 100 MG capsule Take 1 capsule (100 mg total) by mouth 2 (two) times daily.  Marland Kitchen sulfamethoxazole-trimethoprim (BACTRIM DS) 800-160 MG tablet Take 1 tablet by mouth 2 (two) times daily.  Marland Kitchen triamcinolone (KENALOG) 0.025 % cream Apply 1 application topically 2 (two) times daily.  . [DISCONTINUED] atorvastatin (LIPITOR) 40 MG tablet Take 1 tablet (40 mg total) by mouth daily at 6 PM. (Patient not taking: Reported on 03/30/2019)  . [DISCONTINUED] docusate sodium (COLACE) 100 MG capsule Take 1 capsule (100 mg total) by mouth 2 (two) times daily. (Patient not taking: Reported on 08/27/2019)  . [DISCONTINUED] famotidine (PEPCID) 20 MG tablet Take 1 tablet (20 mg total) by mouth 2 (two) times daily. For Gastroprotection  . [DISCONTINUED] gabapentin (NEURONTIN) 300 MG capsule Take 1 capsule (300 mg total) by mouth 3 (three) times daily for 14 days. For 2 weeks post op for pain.  . [DISCONTINUED] lacosamide (VIMPAT) 50 MG TABS tablet Take 1 tablet (50 mg total) by mouth 2 (two) times daily. (Patient not taking: Reported on 03/30/2019)  . [DISCONTINUED] sertraline (ZOLOFT) 100 MG  tablet Take 1 tablet (100 mg total) by mouth daily. (Patient not taking: Reported on 08/27/2019)  . [DISCONTINUED] tamsulosin (FLOMAX) 0.4 MG CAPS capsule Take 1 capsule (0.4 mg total) by mouth daily. (Patient not taking: Reported on 03/07/2019)   No facility-administered encounter medications on file as of 08/27/2019.       Medical History: Past Medical History:  Diagnosis Date  . Abnormal Pap smear of cervix   . Anxiety   . Arthritis   . Bipolar 1 disorder ([place])   .  COPD (chronic obstructive pulmonary disease) ([place])   . Depression   . Dyspnea    with exertion   . Kidney stone   . Oxygen dependent    patient uses 2L oz PRN ; reports hardly ever uses it unless very SOB    . Rocky Mountain spotted fever 2018  . Sarcoidosis of lung ([place])    managed by Dr Lillette Boxer   . Seizures ([place]) last seizure 05/2013  . Sleep apnea    uses CPAP nightly   . UTI (urinary tract infection) 11/13/2018   see ED visit in epic ; was dc'd with oral abx and reports completed and relief of sx      Today's Vitals   08/27/19 1433  BP: (!) 112/59  Pulse: 78  Resp: 16  Temp: 98.4 F (36.9 C)  SpO2: 96%  Weight: 240 lb 12.8 oz (109.2 kg)  Height: 5\' 6"  (1.676 m)   Body mass index is 38.87 kg/m.  Review of Systems  Constitutional: Positive for fatigue. Negative for chills and unexpected weight change.  HENT: Positive for ear discharge and ear pain. Negative for congestion, postnasal drip, rhinorrhea, sneezing and sore throat.        Left ear  Respiratory: Negative for cough, chest tightness, shortness of breath and wheezing.   Cardiovascular: Negative for chest pain and palpitations.  Gastrointestinal: Negative for abdominal pain, constipation, diarrhea, nausea and vomiting.  Endocrine: Negative for cold intolerance, heat intolerance, polydipsia and polyuria.  Genitourinary: Positive for flank pain, frequency and urgency. Negative for dysuria.  Musculoskeletal: Positive for arthralgias. Negative for back pain, joint swelling and neck pain.  Skin: Positive for rash.       Itchiness of skin of arms and legs. Getting worse   Allergic/Immunologic: Negative for environmental allergies.  Neurological: Negative.  Negative for dizziness, tremors, numbness and headaches.  Hematological: Negative for adenopathy. Does not bruise/bleed easily.  Psychiatric/Behavioral: Negative for behavioral problems (Depression), sleep disturbance and suicidal ideas. The patient is not  nervous/anxious.     Physical Exam Vitals signs and nursing note reviewed.  Constitutional:      General: She is not in acute distress.    Appearance: Normal appearance. She is well-developed. She is not diaphoretic.  HENT:     Head: Normocephalic and atraumatic.     Right Ear: Tenderness present. Tympanic membrane is erythematous and bulging.     Left Ear: Tenderness present. Tympanic membrane is erythematous and bulging.     Ears:     Comments: Left ear worse than right.    Mouth/Throat:     Pharynx: No oropharyngeal exudate.  Eyes:     Pupils: Pupils are equal, round, and reactive to light.  Neck:     Musculoskeletal: Normal range of motion and neck supple.     Thyroid: No thyromegaly.     Vascular: No JVD.     Trachea: No tracheal deviation.  Cardiovascular:     Rate and Rhythm: Normal  rate and regular rhythm.     Heart sounds: Normal heart sounds. No murmur. No friction rub. No gallop.   Pulmonary:     Effort: Pulmonary effort is normal. No respiratory distress.     Breath sounds: Normal breath sounds. No wheezing or rales.  Chest:     Chest wall: No tenderness.  Abdominal:     General: Bowel sounds are normal.     Palpations: Abdomen is soft.     Tenderness: There is no abdominal tenderness.  Genitourinary:    Comments: Urinalysis is positive for WBC, RBC, protein, and nitrites.  Musculoskeletal: Normal range of motion.  Lymphadenopathy:     Cervical: No cervical adenopathy.  Skin:    General: Skin is warm and dry.  Neurological:     Mental Status: She is alert and oriented to person, place, and time.     Cranial Nerves: No cranial nerve deficit.  Psychiatric:        Behavior: Behavior normal.        Thought Content: Thought content normal.        Judgment: Judgment normal.   Assessment/Plan: 1. Acute otitis externa of both ears, unspecified type Cortisporin ear drops precribe.d use four drops in both ears twice daily for next 7 days. Take OTC medication to  alleviate symptoms.  - neomycin-polymyxin-hydrocortisone (CORTISPORIN) 3.5-10000-1 ophthalmic suspension; Insert four drops in both ears twice daily for seven days.  Dispense: 7.5 mL; Refill: 0  2. Urinary tract infection without hematuria, site unspecified Start bactrim DS bid for 10 days. Changed to macrobid 100mg  twice daily after urine culture results were back.  - sulfamethoxazole-trimethoprim (BACTRIM DS) 800-160 MG tablet; Take 1 tablet by mouth 2 (two) times daily.  Dispense: 20 tablet; Refill: 0 - nitrofurantoin, macrocrystal-monohydrate, (MACROBID) 100 MG capsule; Take 1 capsule (100 mg total) by mouth 2 (two) times daily.  Dispense: 20 capsule; Refill: 0  3. Atopic dermatitis, unspecified type May use triamcinolone cream twice daily as needed  - triamcinolone (KENALOG) 0.025 % cream; Apply 1 application topically 2 (two) times daily.  Dispense: 80 g; Refill: 2  4. Inflammatory polyarthritis (HCC) - meloxicam (MOBIC) 7.5 MG tablet; Take 1 tablet (7.5 mg total) by mouth daily.  Dispense: 30 tablet; Refill: 0  5. Dysuria - POCT Urinalysis Dipstick  6. Acute cystitis with hematuria - CULTURE, URINE COMPREHENSIVE  General Counseling: Analissa verbalizes understanding of the findings of todays visit and agrees with plan of treatment. I have discussed any further diagnostic evaluation that may be needed or ordered today. We also reviewed her medications today. she has been encouraged to call the office with any questions or concerns that should arise related to todays visit.    Counseling:  This patient was seen by Leretha Pol FNP Collaboration with Dr Lavera Guise as a part of collaborative care agreement  Orders Placed This Encounter  Procedures  . CULTURE, URINE COMPREHENSIVE  . POCT Urinalysis Dipstick    Meds ordered this encounter  Medications  . sulfamethoxazole-trimethoprim (BACTRIM DS) 800-160 MG tablet    Sig: Take 1 tablet by mouth 2 (two) times daily.     Dispense:  20 tablet    Refill:  0    Order Specific Question:   Supervising Provider    Answer:   Lavera Guise [[id number]]  . neomycin-polymyxin-hydrocortisone (CORTISPORIN) 3.5-10000-1 ophthalmic suspension    Sig: Insert four drops in both ears twice daily for seven days.    Dispense:  7.5 mL  Refill:  0    Order Specific Question:   Supervising Provider    Answer:   Lavera Guise [[id number]]  . triamcinolone (KENALOG) 0.025 % cream    Sig: Apply 1 application topically 2 (two) times daily.    Dispense:  80 g    Refill:  2    Order Specific Question:   Supervising Provider    Answer:   Lavera Guise [[id number]]  . meloxicam (MOBIC) 7.5 MG tablet    Sig: Take 1 tablet (7.5 mg total) by mouth daily.    Dispense:  30 tablet    Refill:  0    Order Specific Question:   Supervising Provider    Answer:   Lavera Guise [[id number]]  . nitrofurantoin, macrocrystal-monohydrate, (MACROBID) 100 MG capsule    Sig: Take 1 capsule (100 mg total) by mouth 2 (two) times daily.    Dispense:  20 capsule    Refill:  0    Please have patient discontinue bactrim.    Order Specific Question:   Supervising Provider    Answer:   Lavera Guise [[id number]]    Time spent: 25 Minutes

## 2019-08-30 LAB — CULTURE, URINE COMPREHENSIVE

## 2019-09-02 ENCOUNTER — Telehealth: Payer: Self-pay | Admitting: Nurse Practitioner

## 2019-09-02 MED ORDER — NITROFURANTOIN MONOHYD MACRO 100 MG PO CAPS: 100.0000 mg | ORAL_CAPSULE | Freq: Two times a day (BID) | ORAL | 0 refills | Status: DC

## 2019-09-02 NOTE — Progress Notes (Signed)
Please let the patient know I needed to switch antibiotics for her UTI. Changed ti macrobid 100mg  bid for 10 days. I sent new prescription to [facility].

## 2019-09-02 NOTE — Telephone Encounter (Signed)
-----   Message from Ronnell Freshwater, NP sent at 09/02/2019 11:44 AM EDT ----- Please let the patient know I needed to switch antibiotics for her UTI. Changed ti macrobid 100mg  bid for 10 days. I sent new prescription to [facility].

## 2019-09-02 NOTE — Telephone Encounter (Signed)
Pt advised on medication change. 

## 2019-09-08 DIAGNOSIS — N3001 Acute cystitis with hematuria: Secondary | ICD-10-CM | POA: Insufficient documentation

## 2019-09-08 DIAGNOSIS — M064 Inflammatory polyarthropathy: Secondary | ICD-10-CM | POA: Insufficient documentation

## 2019-09-08 DIAGNOSIS — L209 Atopic dermatitis, unspecified: Secondary | ICD-10-CM | POA: Insufficient documentation

## 2019-09-16 ENCOUNTER — Other Ambulatory Visit: Payer: Self-pay

## 2019-09-16 MED ORDER — MELOXICAM 15 MG PO TABS: 15.0000 mg | ORAL_TABLET | Freq: Every day | ORAL | 1 refills | Status: DC

## 2019-09-16 NOTE — Telephone Encounter (Signed)
As per heather is change to meloxicam 15 mg to phar due to pt was on 15mg  and we accidentally send 7.5

## 2019-09-24 DIAGNOSIS — H903 Sensorineural hearing loss, bilateral: Secondary | ICD-10-CM | POA: Diagnosis not present

## 2019-09-24 DIAGNOSIS — H6063 Unspecified chronic otitis externa, bilateral: Secondary | ICD-10-CM | POA: Diagnosis not present

## 2019-09-25 ENCOUNTER — Ambulatory Visit: Payer: Medicare Other | Admitting: Nurse Practitioner

## 2019-10-08 ENCOUNTER — Telehealth: Payer: Self-pay

## 2019-10-08 NOTE — Telephone Encounter (Signed)
PATIENT DISCHARGED FROM [facility] DUE TO NON COMPLIANCE WITH PHYSICIAN APPOINTMENTS.  MAILED D.C. LETTER 10/08/19

## 2019-10-09 ENCOUNTER — Other Ambulatory Visit: Payer: Self-pay | Admitting: Internal Medicine

## 2019-11-02 ENCOUNTER — Other Ambulatory Visit: Payer: Self-pay

## 2019-11-02 DIAGNOSIS — F331 Major depressive disorder, recurrent, moderate: Secondary | ICD-10-CM

## 2019-11-02 MED ORDER — MELOXICAM 15 MG PO TABS: 15.0000 mg | ORAL_TABLET | Freq: Every day | ORAL | 0 refills | Status: DC

## 2019-11-02 MED ORDER — VENLAFAXINE HCL ER 75 MG PO CP24: 150.0000 mg | ORAL_CAPSULE | Freq: Every day | ORAL | 0 refills | Status: DC

## 2019-11-24 DIAGNOSIS — R011 Cardiac murmur, unspecified: Secondary | ICD-10-CM | POA: Diagnosis not present

## 2019-11-24 DIAGNOSIS — R030 Elevated blood-pressure reading, without diagnosis of hypertension: Secondary | ICD-10-CM | POA: Diagnosis not present

## 2019-11-24 DIAGNOSIS — Z1231 Encounter for screening mammogram for malignant neoplasm of breast: Secondary | ICD-10-CM | POA: Diagnosis not present

## 2019-11-24 DIAGNOSIS — N3000 Acute cystitis without hematuria: Secondary | ICD-10-CM | POA: Diagnosis not present

## 2019-11-24 DIAGNOSIS — Z1389 Encounter for screening for other disorder: Secondary | ICD-10-CM | POA: Diagnosis not present

## 2019-11-25 ENCOUNTER — Other Ambulatory Visit: Payer: Self-pay | Admitting: Family Medicine

## 2019-11-25 DIAGNOSIS — Z1231 Encounter for screening mammogram for malignant neoplasm of breast: Secondary | ICD-10-CM

## 2019-12-22 DIAGNOSIS — R011 Cardiac murmur, unspecified: Secondary | ICD-10-CM | POA: Diagnosis not present

## 2019-12-22 DIAGNOSIS — L03019 Cellulitis of unspecified finger: Secondary | ICD-10-CM | POA: Diagnosis not present

## 2019-12-22 DIAGNOSIS — Z87440 Personal history of urinary (tract) infections: Secondary | ICD-10-CM | POA: Diagnosis not present

## 2019-12-22 DIAGNOSIS — N3000 Acute cystitis without hematuria: Secondary | ICD-10-CM | POA: Diagnosis not present

## 2019-12-29 DIAGNOSIS — L03012 Cellulitis of left finger: Secondary | ICD-10-CM | POA: Diagnosis not present

## 2020-01-05 ENCOUNTER — Emergency Department: Payer: Medicare Other

## 2020-01-05 ENCOUNTER — Encounter
Admission: EM | Disposition: A | Discharge status: Home / Self Care | Payer: Self-pay | Source: Home / Self Care | Attending: Emergency Medicine

## 2020-01-05 ENCOUNTER — Observation Stay: Payer: Medicare Other | Admitting: Anesthesiology

## 2020-01-05 ENCOUNTER — Other Ambulatory Visit: Payer: Self-pay

## 2020-01-05 ENCOUNTER — Observation Stay
Admission: EM | Admit: 2020-01-05 | Discharge: 2020-01-06 | Disposition: A | Discharge status: Home / Self Care | Payer: Medicare Other | Attending: Internal Medicine | Admitting: Internal Medicine

## 2020-01-05 ENCOUNTER — Encounter: Payer: Self-pay | Admitting: Emergency Medicine

## 2020-01-05 ENCOUNTER — Observation Stay: Payer: Medicare Other

## 2020-01-05 DIAGNOSIS — B9620 Unspecified Escherichia coli [E. coli] as the cause of diseases classified elsewhere: Secondary | ICD-10-CM | POA: Insufficient documentation

## 2020-01-05 DIAGNOSIS — G4733 Obstructive sleep apnea (adult) (pediatric): Secondary | ICD-10-CM | POA: Insufficient documentation

## 2020-01-05 DIAGNOSIS — M1990 Unspecified osteoarthritis, unspecified site: Secondary | ICD-10-CM | POA: Insufficient documentation

## 2020-01-05 DIAGNOSIS — N200 Calculus of kidney: Secondary | ICD-10-CM | POA: Diagnosis not present

## 2020-01-05 DIAGNOSIS — D860 Sarcoidosis of lung: Secondary | ICD-10-CM | POA: Diagnosis not present

## 2020-01-05 DIAGNOSIS — Z791 Long term (current) use of non-steroidal anti-inflammatories (NSAID): Secondary | ICD-10-CM | POA: Diagnosis not present

## 2020-01-05 DIAGNOSIS — N201 Calculus of ureter: Secondary | ICD-10-CM

## 2020-01-05 DIAGNOSIS — F419 Anxiety disorder, unspecified: Secondary | ICD-10-CM | POA: Insufficient documentation

## 2020-01-05 DIAGNOSIS — N12 Tubulo-interstitial nephritis, not specified as acute or chronic: Secondary | ICD-10-CM

## 2020-01-05 DIAGNOSIS — E669 Obesity, unspecified: Secondary | ICD-10-CM | POA: Diagnosis not present

## 2020-01-05 DIAGNOSIS — Z87442 Personal history of urinary calculi: Secondary | ICD-10-CM | POA: Diagnosis not present

## 2020-01-05 DIAGNOSIS — Z96642 Presence of left artificial hip joint: Secondary | ICD-10-CM | POA: Diagnosis not present

## 2020-01-05 DIAGNOSIS — F319 Bipolar disorder, unspecified: Secondary | ICD-10-CM | POA: Insufficient documentation

## 2020-01-05 DIAGNOSIS — Z8249 Family history of ischemic heart disease and other diseases of the circulatory system: Secondary | ICD-10-CM | POA: Insufficient documentation

## 2020-01-05 DIAGNOSIS — I700 Atherosclerosis of aorta: Secondary | ICD-10-CM | POA: Insufficient documentation

## 2020-01-05 DIAGNOSIS — N132 Hydronephrosis with renal and ureteral calculous obstruction: Secondary | ICD-10-CM | POA: Diagnosis not present

## 2020-01-05 DIAGNOSIS — F17210 Nicotine dependence, cigarettes, uncomplicated: Secondary | ICD-10-CM | POA: Insufficient documentation

## 2020-01-05 DIAGNOSIS — Z20822 Contact with and (suspected) exposure to covid-19: Secondary | ICD-10-CM | POA: Diagnosis not present

## 2020-01-05 DIAGNOSIS — Z7982 Long term (current) use of aspirin: Secondary | ICD-10-CM | POA: Diagnosis not present

## 2020-01-05 DIAGNOSIS — J449 Chronic obstructive pulmonary disease, unspecified: Secondary | ICD-10-CM | POA: Diagnosis not present

## 2020-01-05 DIAGNOSIS — N2889 Other specified disorders of kidney and ureter: Secondary | ICD-10-CM | POA: Diagnosis not present

## 2020-01-05 DIAGNOSIS — Z79899 Other long term (current) drug therapy: Secondary | ICD-10-CM | POA: Diagnosis not present

## 2020-01-05 DIAGNOSIS — Z6837 Body mass index (BMI) 37.0-37.9, adult: Secondary | ICD-10-CM | POA: Diagnosis not present

## 2020-01-05 DIAGNOSIS — N390 Urinary tract infection, site not specified: Secondary | ICD-10-CM | POA: Diagnosis not present

## 2020-01-05 DIAGNOSIS — Z9981 Dependence on supplemental oxygen: Secondary | ICD-10-CM | POA: Insufficient documentation

## 2020-01-05 DIAGNOSIS — Z03818 Encounter for observation for suspected exposure to other biological agents ruled out: Secondary | ICD-10-CM | POA: Diagnosis not present

## 2020-01-05 DIAGNOSIS — J4520 Mild intermittent asthma, uncomplicated: Secondary | ICD-10-CM | POA: Insufficient documentation

## 2020-01-05 DIAGNOSIS — Z7951 Long term (current) use of inhaled steroids: Secondary | ICD-10-CM | POA: Diagnosis not present

## 2020-01-05 DIAGNOSIS — R1032 Left lower quadrant pain: Secondary | ICD-10-CM | POA: Diagnosis not present

## 2020-01-05 HISTORY — PX: CYSTOSCOPY WITH STENT PLACEMENT: SHX5790

## 2020-01-05 LAB — URINALYSIS, COMPLETE (UACMP) WITH MICROSCOPIC
Bacteria, UA: NONE SEEN
Bilirubin Urine: NEGATIVE
Glucose, UA: NEGATIVE mg/dL
Ketones, ur: NEGATIVE mg/dL
Nitrite: POSITIVE — AB
Protein, ur: 100 mg/dL — AB
Specific Gravity, Urine: 1.015 (ref 1.005–1.030)
WBC, UA: >50 WBC/hpf — ABNORMAL HIGH (ref 0–5)
pH: 5 (ref 5.0–8.0)

## 2020-01-05 LAB — CBC
HCT: 44 % (ref 36.0–46.0)
Hemoglobin: 14.7 g/dL (ref 12.0–15.0)
MCH: 30.4 pg (ref 26.0–34.0)
MCHC: 33.4 g/dL (ref 30.0–36.0)
MCV: 90.9 fL (ref 80.0–100.0)
Platelets: 269 K/uL (ref 150–400)
RBC: 4.84 MIL/uL (ref 3.87–5.11)
RDW: 14.1 % (ref 11.5–15.5)
WBC: 10.7 K/uL — ABNORMAL HIGH (ref 4.0–10.5)
nRBC: 0 % (ref 0.0–0.2)

## 2020-01-05 LAB — COMPREHENSIVE METABOLIC PANEL
ALT: 12 U/L (ref 0–44)
AST: 17 U/L (ref 15–41)
Albumin: 4.3 g/dL (ref 3.5–5.0)
Alkaline Phosphatase: 72 U/L (ref 38–126)
Anion gap: 9 (ref 5–15)
BUN: 18 mg/dL (ref 6–20)
CO2: 27 mmol/L (ref 22–32)
Calcium: 9.2 mg/dL (ref 8.9–10.3)
Chloride: 103 mmol/L (ref 98–111)
Creatinine, Ser: 0.73 mg/dL (ref 0.44–1.00)
GFR calc Af Amer: >60 mL/min (ref >60)
GFR calc non Af Amer: >60 mL/min (ref >60)
Glucose, Bld: 131 mg/dL — ABNORMAL HIGH (ref 70–99)
Potassium: 4 mmol/L (ref 3.5–5.1)
Sodium: 139 mmol/L (ref 135–145)
Total Bilirubin: 0.6 mg/dL (ref 0.3–1.2)
Total Protein: 8.9 g/dL — ABNORMAL HIGH (ref 6.5–8.1)

## 2020-01-05 LAB — RESPIRATORY PANEL BY RT PCR (FLU A&B, COVID)
Influenza A by PCR: NEGATIVE
Influenza B by PCR: NEGATIVE
SARS Coronavirus 2 by RT PCR: NEGATIVE

## 2020-01-05 LAB — LIPASE, BLOOD: Lipase: 23 U/L (ref 11–51)

## 2020-01-05 SURGERY — CYSTOSCOPY, WITH STENT INSERTION
Anesthesia: General | Laterality: Left

## 2020-01-05 MED ORDER — ONDANSETRON HCL 4 MG/2ML IJ SOLN: 4.0000 mg | Freq: Once | INTRAMUSCULAR | Status: DC | PRN

## 2020-01-05 MED ORDER — TRAZODONE HCL 50 MG PO TABS
25.0000 mg | ORAL_TABLET | Freq: Every evening | ORAL | Status: DC | PRN
  Filled 2020-01-05: qty 0.5

## 2020-01-05 MED ORDER — ONDANSETRON HCL 4 MG/2ML IJ SOLN: 4.0000 mg | Freq: Four times a day (QID) | INTRAMUSCULAR | Status: DC | PRN

## 2020-01-05 MED ORDER — ALBUTEROL SULFATE HFA 108 (90 BASE) MCG/ACT IN AERS
2.0000 | INHALATION_SPRAY | RESPIRATORY_TRACT | Status: DC | PRN
  Filled 2020-01-05: qty 6.7

## 2020-01-05 MED ORDER — ENOXAPARIN SODIUM 40 MG/0.4ML ~~LOC~~ SOLN
40.0000 mg | SUBCUTANEOUS | Status: DC
  Filled 2020-01-05: qty 0.4

## 2020-01-05 MED ORDER — MORPHINE SULFATE (PF) 2 MG/ML IV SOLN
2.0000 mg | Freq: Once | INTRAVENOUS | Status: AC
  Administered 2020-01-05: 2 mg via INTRAVENOUS
  Filled 2020-01-05: qty 1

## 2020-01-05 MED ORDER — ROCURONIUM BROMIDE 50 MG/5ML IV SOLN
INTRAVENOUS | Status: AC
  Filled 2020-01-05: qty 1

## 2020-01-05 MED ORDER — HYDROCODONE-ACETAMINOPHEN 5-325 MG PO TABS
1.0000 | ORAL_TABLET | ORAL | Status: DC | PRN
  Administered 2020-01-06: 1 via ORAL
  Filled 2020-01-05: qty 1

## 2020-01-05 MED ORDER — LIDOCAINE HCL (PF) 2 % IJ SOLN
INTRAMUSCULAR | Status: AC
  Filled 2020-01-05: qty 5

## 2020-01-05 MED ORDER — FLUTICASONE PROPIONATE 50 MCG/ACT NA SUSP
2.0000 | Freq: Every day | NASAL | Status: DC
  Filled 2020-01-05: qty 16

## 2020-01-05 MED ORDER — SODIUM CHLORIDE 0.9 % IV SOLN: INTRAVENOUS | Status: DC

## 2020-01-05 MED ORDER — ONDANSETRON HCL 4 MG/2ML IJ SOLN
INTRAMUSCULAR | Status: DC | PRN
  Administered 2020-01-05: 4 mg via INTRAVENOUS

## 2020-01-05 MED ORDER — PROPOFOL 10 MG/ML IV BOLUS
INTRAVENOUS | Status: AC
  Filled 2020-01-05: qty 20

## 2020-01-05 MED ORDER — SUCCINYLCHOLINE CHLORIDE 20 MG/ML IJ SOLN
INTRAMUSCULAR | Status: AC
  Filled 2020-01-05: qty 1

## 2020-01-05 MED ORDER — ONDANSETRON HCL 4 MG/2ML IJ SOLN
INTRAMUSCULAR | Status: AC
  Filled 2020-01-05: qty 2

## 2020-01-05 MED ORDER — PROPOFOL 10 MG/ML IV BOLUS
INTRAVENOUS | Status: DC | PRN
  Administered 2020-01-05: 20 mg via INTRAVENOUS
  Administered 2020-01-05: 180 mg via INTRAVENOUS
  Administered 2020-01-05: 30 mg via INTRAVENOUS

## 2020-01-05 MED ORDER — GLYCOPYRROLATE 0.2 MG/ML IJ SOLN
INTRAMUSCULAR | Status: DC | PRN
  Administered 2020-01-05: .2 mg via INTRAVENOUS

## 2020-01-05 MED ORDER — PHENYLEPHRINE HCL (PRESSORS) 10 MG/ML IV SOLN
INTRAVENOUS | Status: DC | PRN
  Administered 2020-01-05: 100 ug via INTRAVENOUS

## 2020-01-05 MED ORDER — MIDAZOLAM HCL 2 MG/2ML IJ SOLN
INTRAMUSCULAR | Status: AC
  Filled 2020-01-05: qty 2

## 2020-01-05 MED ORDER — FENTANYL CITRATE (PF) 100 MCG/2ML IJ SOLN
INTRAMUSCULAR | Status: DC | PRN
  Administered 2020-01-05: 50 ug via INTRAVENOUS
  Administered 2020-01-05 (×2): 25 ug via INTRAVENOUS

## 2020-01-05 MED ORDER — FENTANYL CITRATE (PF) 100 MCG/2ML IJ SOLN
INTRAMUSCULAR | Status: AC
  Filled 2020-01-05: qty 2

## 2020-01-05 MED ORDER — DEXAMETHASONE SODIUM PHOSPHATE 10 MG/ML IJ SOLN
INTRAMUSCULAR | Status: AC
  Filled 2020-01-05: qty 1

## 2020-01-05 MED ORDER — MIDAZOLAM HCL 2 MG/2ML IJ SOLN
INTRAMUSCULAR | Status: DC | PRN
  Administered 2020-01-05: 2 mg via INTRAVENOUS

## 2020-01-05 MED ORDER — EPHEDRINE SULFATE 50 MG/ML IJ SOLN
INTRAMUSCULAR | Status: DC | PRN
  Administered 2020-01-05: 10 mg via INTRAVENOUS

## 2020-01-05 MED ORDER — ACETAMINOPHEN 10 MG/ML IV SOLN
INTRAVENOUS | Status: AC
  Filled 2020-01-05: qty 100

## 2020-01-05 MED ORDER — ACETAMINOPHEN 10 MG/ML IV SOLN
INTRAVENOUS | Status: DC | PRN
  Administered 2020-01-05: 1000 mg via INTRAVENOUS

## 2020-01-05 MED ORDER — BISACODYL 5 MG PO TBEC: 5.0000 mg | DELAYED_RELEASE_TABLET | Freq: Every day | ORAL | Status: DC | PRN

## 2020-01-05 MED ORDER — ONDANSETRON HCL 4 MG PO TABS: 4.0000 mg | ORAL_TABLET | Freq: Four times a day (QID) | ORAL | Status: DC | PRN

## 2020-01-05 MED ORDER — DEXAMETHASONE SODIUM PHOSPHATE 10 MG/ML IJ SOLN
INTRAMUSCULAR | Status: DC | PRN
  Administered 2020-01-05: 10 mg via INTRAVENOUS

## 2020-01-05 MED ORDER — FENTANYL CITRATE (PF) 100 MCG/2ML IJ SOLN: 25.0000 ug | INTRAMUSCULAR | Status: DC | PRN

## 2020-01-05 MED ORDER — SODIUM CHLORIDE 0.9 % IV SOLN
1.0000 g | Freq: Once | INTRAVENOUS | Status: AC
  Administered 2020-01-05: 1 g via INTRAVENOUS
  Filled 2020-01-05: qty 10

## 2020-01-05 MED ORDER — IOHEXOL 300 MG/ML  SOLN
100.0000 mL | Freq: Once | INTRAMUSCULAR | Status: AC | PRN
  Administered 2020-01-05: 100 mL via INTRAVENOUS
  Filled 2020-01-05: qty 100

## 2020-01-05 MED ORDER — ONDANSETRON HCL 4 MG/2ML IJ SOLN
4.0000 mg | Freq: Once | INTRAMUSCULAR | Status: AC
  Administered 2020-01-05: 4 mg via INTRAVENOUS
  Filled 2020-01-05: qty 2

## 2020-01-05 MED ORDER — LIDOCAINE HCL (CARDIAC) PF 100 MG/5ML IV SOSY
PREFILLED_SYRINGE | INTRAVENOUS | Status: DC | PRN
  Administered 2020-01-05: 100 mg via INTRAVENOUS

## 2020-01-05 MED ORDER — MELOXICAM 7.5 MG PO TABS
15.0000 mg | ORAL_TABLET | Freq: Every day | ORAL | Status: DC
  Administered 2020-01-05: 15 mg via ORAL
  Filled 2020-01-05 (×3): qty 2

## 2020-01-05 MED ORDER — DOCUSATE SODIUM 100 MG PO CAPS
100.0000 mg | ORAL_CAPSULE | Freq: Two times a day (BID) | ORAL | Status: DC
  Administered 2020-01-05 – 2020-01-06 (×2): 100 mg via ORAL
  Filled 2020-01-05 (×3): qty 1

## 2020-01-05 MED ORDER — VENLAFAXINE HCL ER 150 MG PO CP24
150.0000 mg | ORAL_CAPSULE | Freq: Every day | ORAL | Status: DC
  Administered 2020-01-06: 150 mg via ORAL
  Filled 2020-01-05: qty 1

## 2020-01-05 MED ORDER — ACETAMINOPHEN 650 MG RE SUPP: 650.0000 mg | Freq: Four times a day (QID) | RECTAL | Status: DC | PRN

## 2020-01-05 MED ORDER — ACETAMINOPHEN 325 MG PO TABS: 650.0000 mg | ORAL_TABLET | Freq: Four times a day (QID) | ORAL | Status: DC | PRN

## 2020-01-05 SURGICAL SUPPLY — 19 items
BAG DRAIN CYSTO-URO LG1000N (MISCELLANEOUS) ×3 IMPLANT
BRUSH SCRUB EZ  4% CHG (MISCELLANEOUS)
BRUSH SCRUB EZ 4% CHG (MISCELLANEOUS) IMPLANT
CATH URETL 5X70 OPEN END (CATHETERS) ×1 IMPLANT
GLOVE BIO SURGEON STRL SZ8 (GLOVE) ×3 IMPLANT
GOWN STRL REUS W/ TWL XL LVL3 (GOWN DISPOSABLE) ×1 IMPLANT
GOWN STRL REUS W/TWL XL LVL3 (GOWN DISPOSABLE) ×3
GUIDEWIRE STR DUAL SENSOR (WIRE) ×3 IMPLANT
KIT TURNOVER CYSTO (KITS) ×3 IMPLANT
PACK CYSTO AR (MISCELLANEOUS) ×3 IMPLANT
SET CYSTO W/LG BORE CLAMP LF (SET/KITS/TRAYS/PACK) ×3 IMPLANT
SOL .9 NS 3000ML IRR  AL (IV SOLUTION) ×2
SOL .9 NS 3000ML IRR AL (IV SOLUTION) ×1
SOL .9 NS 3000ML IRR UROMATIC (IV SOLUTION) ×1 IMPLANT
STENT URET 6FRX22 CONTOUR (STENTS) ×2 IMPLANT
STENT URET 6FRX24 CONTOUR (STENTS) IMPLANT
STENT URET 6FRX26 CONTOUR (STENTS) IMPLANT
SURGILUBE 2OZ TUBE FLIPTOP (MISCELLANEOUS) ×3 IMPLANT
WATER STERILE IRR 1000ML POUR (IV SOLUTION) ×3 IMPLANT

## 2020-01-05 NOTE — Consult Note (Signed)
Urology Consult  Requesting physician: Dr. Manson Passey Reason for consultation: Obstructing stone with infection  Chief Complaint: Abdominal pain  History of Present Illness: Nicole Wyatt is a 54 y.o. female who was awakened early this morning with severe left lower quadrant abdominal pain and bladder spasms.  There were no identifiable precipitating, aggravating or alleviating factors.  Severity was rated 10/10.  She states she has been treated for a recurrent UTI for the past 2-1/2 months.  She does complain of urinary frequency, urgency and dysuria.  Urinalysis with significant pyuria and nitrite positive.  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  She has a prior history of stone disease and states she was treated by Dr. Evelene Croon several years ago.    Past Medical History:  Diagnosis Date  . Abnormal Pap smear of cervix   . Anxiety   . Arthritis   . Bipolar 1 disorder (HCC)   . COPD (chronic obstructive pulmonary disease) (HCC)   . Depression   . Dyspnea    with exertion   . Kidney stone   . Oxygen dependent    patient uses 2L oz PRN ; reports hardly ever uses it unless very SOB    . Rocky Mountain spotted fever 2018  . Sarcoidosis of lung (HCC)    managed by Dr Lindie Spruce   . Seizures (HCC) last seizure 05/2013  . Sleep apnea    uses CPAP nightly   . UTI (urinary tract infection) 11/13/2018   see ED visit in epic ; was dc'd with oral abx and reports completed and relief of sx     Past Surgical History:  Procedure Laterality Date  . ABDOMINAL HYSTERECTOMY  1999  . BLADDER SURGERY    . CARDIAC CATHETERIZATION Bilateral 05/29/2016   Procedure: Right/Left Heart Cath and Coronary Angiography;  Surgeon: Lamar Blinks, MD;  Location: [facility] INVASIVE CV LAB;  Service: Cardiovascular;  Laterality: Bilateral;  .  COLONOSCOPY    . COLONOSCOPY N/A 07/19/2015   Procedure: COLONOSCOPY;  Surgeon: Earline Mayotte, MD;  Location: [facility] ENDOSCOPY;  Service: Endoscopy;  Laterality: N/A;  . FLEXIBLE BRONCHOSCOPY N/A 07/27/2016   Procedure: FLEXIBLE BRONCHOSCOPY;  Surgeon: Yevonne Pax, MD;  Location: [facility] ORS;  Service: Pulmonary;  Laterality: N/A;  . FOOT SURGERY    . KIDNEY STONE SURGERY  7 years  . TOTAL HIP ARTHROPLASTY Left 12/02/2018   Procedure: TOTAL HIP ARTHROPLASTY ANTERIOR APPROACH;  Surgeon: Sheral Apley, MD;  Location: [facility] ORS;  Service: Orthopedics;  Laterality: Left;  . TUBAL LIGATION      Home Medications:  No outpatient medications have been marked as taking for the 01/05/20 encounter [facility] Encounter).    Allergies:  Allergies  Allergen Reactions  . Bee Venom Anaphylaxis  . Ciprofloxacin Nausea Only  . Penicillins Rash and Other (See Comments)    Has patient had a PCN reaction causing immediate rash, facial/tongue/throat swelling, SOB or lightheadedness with hypotension: no Has patient had a PCN reaction causing severe rash involving mucus membranes or skin necrosis: no Has patient had a PCN reaction that required hospitalization no Has patient had a PCN reaction occurring within the last 10 years: {yes If all of the above answers are "NO", then may proceed with Cephalosporin use.     Family History  Problem Relation Age of Onset  .  Hypertension Mother   . Arthritis Mother   . Hypertension Father   . Arthritis Father   . Prostate cancer Father   . Ovarian cancer Maternal Grandmother     Social History:  reports that she has been smoking cigarettes. She has a 7.00 pack-year smoking history. She has never used smokeless tobacco. She reports current alcohol use. She reports that she does not use drugs.  ROS: A complete review of systems was performed.  All systems are negative except for pertinent findings as noted.  Physical Exam:  Vital signs in last 24 hours: Temp:   [98.8 F (37.1 C)-99.6 F (37.6 C)] 98.8 F (37.1 C) (01/12 1315) Pulse Rate:  [60-70] 70 (01/12 1508) Resp:  [18-20] 20 (01/12 1508) BP: (121-165)/(50-86) 121/50 (01/12 1508) SpO2:  [100 %] 100 % (01/12 1508) Weight:  [108.9 kg] 108.9 kg (01/12 1142) Constitutional:  Alert and oriented, No acute distress HEENT: [place] AT, moist mucus membranes.  Trachea midline, no masses Cardiovascular: Regular rate and rhythm, no clubbing, cyanosis, or edema. Respiratory: Normal respiratory effort, lungs clear bilaterally GI: Abdomen is soft, nontender, nondistended, no abdominal masses GU: Positive left CVA tenderness and mild left lower quadrant tenderness Skin: No rashes, bruises or suspicious lesions Lymph: No cervical or inguinal adenopathy Neurologic: Grossly intact, no focal deficits, moving all 4 extremities Psychiatric: Normal mood and affect   Laboratory Data:  Recent Labs    01/05/20 1248  WBC 10.7*  HGB 14.7  HCT 44.0   Recent Labs    01/05/20 1248  NA 139  K 4.0  CL 103  CO2 27  GLUCOSE 131*  BUN 18  CREATININE 0.73  CALCIUM 9.2   No results for input(s): LABPT, INR in the last 72 hours. No results for input(s): LABURIN in the last 72 hours. Results for orders placed or performed in visit on 08/27/19  CULTURE, URINE COMPREHENSIVE     Status: Abnormal   Collection Time: 08/27/19 12:00 AM   Specimen: Urine   URINE  Result Value Ref Range Status   Urine Culture, Comprehensive Final report (A)  Final   Organism ID, Bacteria Escherichia coli (A)  Final    Comment: Greater than 100,000 colony forming units per mL Cefazolin <=4 ug/mL Cefazolin with an MIC <=16 predicts susceptibility to the oral agents cefaclor, cefdinir, cefpodoxime, cefprozil, cefuroxime, cephalexin, and loracarbef when used for therapy of uncomplicated urinary tract infections due to E. coli, Klebsiella pneumoniae, and Proteus mirabilis.    Organism ID, Bacteria Comment  Final    Comment: Mixed  urogenital flora 10,000-25,000 colony forming units per mL    ANTIMICROBIAL SUSCEPTIBILITY Comment  Final    Comment:       ** S = Susceptible; I = Intermediate; R = Resistant **                    P = Positive; N = Negative             MICS are expressed in micrograms per mL    Antibiotic                 RSLT#1    RSLT#2    RSLT#3    RSLT#4 Amoxicillin/Clavulanic Acid    S Ampicillin                     S Cefepime  S Ceftriaxone                    S Cefuroxime                     S Ciprofloxacin                  R Ertapenem                      S Gentamicin                     S Imipenem                       S Levofloxacin                   R Meropenem                      S Nitrofurantoin                 S Piperacillin/Tazobactam        S Tetracycline                   R Tobramycin                     S Trimethoprim/Sulfa             R      Radiologic Imaging: CT ABDOMEN PELVIS W CONTRAST  Result Date: 01/05/2020 CLINICAL DATA:  54 year old female with history of left lower quadrant abdominal pain. EXAM: CT ABDOMEN AND PELVIS WITH CONTRAST TECHNIQUE: Multidetector CT imaging of the abdomen and pelvis was performed using the standard protocol following bolus administration of intravenous contrast. CONTRAST:  153mL OMNIPAQUE IOHEXOL 300 MG/ML  SOLN COMPARISON:  CT of the abdomen and pelvis 11/13/2018. FINDINGS: Lower chest: Unremarkable. Hepatobiliary: No suspicious cystic or solid hepatic lesions. No intra or extrahepatic biliary ductal dilatation. Gallbladder is normal in appearance. Pancreas: No pancreatic mass. No pancreatic ductal dilatation. No pancreatic or peripancreatic fluid collections or inflammatory changes. Spleen: Unremarkable. Adrenals/Urinary Tract: At the left ureteropelvic junction there is a 1.2 cm obstructive calculus (axial image 84 of series 2) which partially prolapses into the urinary bladder because of an underlying ureterocele. This is  associated with moderate proximal left hydroureteronephrosis and extensive left perinephric stranding. Right kidney is normal in appearance. Urinary bladder is otherwise unremarkable in appearance. Small bilateral adrenal nodules (right greater than left) measuring up to 2.2 x 1.4 cm on the right side, incompletely characterize, but stable on prior examinations, favored to represent benign lesions such as lipid poor adenomas. Stomach/Bowel: Normal appearance of the stomach. No pathologic dilatation of small bowel or colon. Normal appendix. Vascular/Lymphatic: Aortic atherosclerosis, without evidence of aneurysm or dissection in the abdominal or pelvic vasculature. Multiple prominent borderline enlarged para-aortic retroperitoneal lymph nodes measuring up to 8 mm in short axis, nonspecific, but likely reactive in the setting of left-sided urinary tract obstruction. No other lymphadenopathy noted elsewhere in the abdomen or pelvis. Reproductive: Status post hysterectomy. Ovaries are unremarkable in appearance. Other: Trace volume of ascites in the cul-de-sac. No pneumoperitoneum. Musculoskeletal: Status post left hip arthroplasty. There are no aggressive appearing lytic or blastic lesions noted in the visualized portions of the skeleton. IMPRESSION: 1. Obstructive calculus at the left ureterovesicular junction within a large left ureterocele, with moderate left proximal  hydroureteronephrosis. 2. Trace volume of ascites. 3. Aortic atherosclerosis. 4. Additional incidental findings, similar to prior studies, as above. Electronically Signed   By: Trudie Reed M.D.   On: 01/05/2020 14:48    Impression:  54 y.o. female with a 12 mm distal ureteral calculus which appears to be within a ureterocele.  She has moderate to severe left hydronephrosis/hydroureter.  Mild leukocytosis and chills with subjective fever.  Urinalysis with >50 WBCs and nitrite positive.  Recommendation:  -Urgent cystoscopy with left ureteral  stent placement.  The procedure was discussed in detail including potential risks of bleeding, sepsis, ureteral injury.  We discussed the potential for inability to place the stent secondary to an impacted stone and if this were to occur she would need a percutaneous nephrostomy.  We also discussed that due to infection no attempt will be made at stone removal and she will need a follow-up procedure for definitive treatment.  Postoperative stent symptoms were also discussed.  She indicated all questions were answered and she desires to proceed.  -She will be admitted to the hospital service for IV antibiotics  -Covid test was negative   01/05/2020, 4:15 PM  Irineo Axon,  MD

## 2020-01-05 NOTE — Transfer of Care (Signed)
Immediate Anesthesia Transfer of Care Note  Patient: Nicole Wyatt  Procedure(s) Performed: CYSTOSCOPY WITH STENT PLACEMENT (Left )  Patient Location: PACU  Anesthesia Type:General  Level of Consciousness: awake, drowsy and patient cooperative  Airway & Oxygen Therapy: Patient Spontanous Breathing and Patient connected to face mask oxygen  Post-op Assessment: Report given to RN and Post -op Vital signs reviewed and stable  Post vital signs: Reviewed and stable  Last Vitals:  Vitals Value Taken Time  BP 110/55 01/05/20 1747  Temp 37.1 C 01/05/20 1747  Pulse 83 01/05/20 1753  Resp 24 01/05/20 1753  SpO2 100 % 01/05/20 1753  Vitals shown include unvalidated device data.  Last Pain:  Vitals:   01/05/20 1747  TempSrc:   PainSc: 0-No pain         Complications: No apparent anesthesia complications

## 2020-01-05 NOTE — ED Notes (Addendum)
Pt to OR.

## 2020-01-05 NOTE — Anesthesia Procedure Notes (Signed)
Procedure Name: LMA Insertion Date/Time: 01/05/2020 5:10 PM Performed by: Lynden Oxford, CRNA Pre-anesthesia Checklist: Patient identified, Emergency Drugs available, Suction available and Patient being monitored Patient Re-evaluated:Patient Re-evaluated prior to induction Oxygen Delivery Method: Circle system utilized Preoxygenation: Pre-oxygenation with 100% oxygen Induction Type: IV induction Ventilation: Mask ventilation without difficulty LMA: LMA inserted LMA Size: 4.0 Tube type: Oral Number of attempts: 2 Airway Equipment and Method: Patient positioned with wedge pillow Placement Confirmation: ETT inserted through vocal cords under direct vision,  positive ETCO2,  CO2 detector and breath sounds checked- equal and bilateral Tube secured with: Tape Dental Injury: Teeth and Oropharynx as per pre-operative assessment

## 2020-01-05 NOTE — ED Notes (Signed)
Report given to [place], Charity fundraiser, [place]

## 2020-01-05 NOTE — ED Triage Notes (Signed)
Pt reports last night woke up out of her sleep with severe pain to her left abd and trouble with urinating.

## 2020-01-05 NOTE — Anesthesia Postprocedure Evaluation (Signed)
Anesthesia Post Note  Patient: Nicole Wyatt  Procedure(s) Performed: CYSTOSCOPY WITH STENT PLACEMENT (Left )  Patient location during evaluation: PACU Anesthesia Type: General Level of consciousness: awake and alert Pain management: pain level controlled Vital Signs Assessment: post-procedure vital signs reviewed and stable Respiratory status: spontaneous breathing and respiratory function stable Cardiovascular status: stable Anesthetic complications: no     Last Vitals:  Vitals:   01/05/20 1802 01/05/20 1817  BP: 114/64 (!) 103/56  Pulse: 90 82  Resp: 20 11  Temp:  36.9 C  SpO2: 99% 99%    Last Pain:  Vitals:   01/05/20 1817  TempSrc:   PainSc: 0-No pain                 [name]

## 2020-01-05 NOTE — ED Provider Notes (Addendum)
[facility] Emergency Department Provider Note  ____________________________________________   First MD Initiated Contact with Patient 01/05/20 1310     (approximate)  I have reviewed the triage vital signs and the nursing notes.   HISTORY  Chief Complaint Abdominal Pain and Urinary Retention   HPI Nicole Wyatt is a 54 y.o. female with below list of previous medical conditions including kidney stones presents to the emergency department secondary to acute onset of left lower quadrant 10 out of 10 abdominal pain with associated nausea and vomiting which began last night.  Patient denies any fever.  Patient denies any diarrhea no constipation.  Patient denies any urinary symptoms.        Past Medical History:  Diagnosis Date  . Abnormal Pap smear of cervix   . Anxiety   . Arthritis   . Bipolar 1 disorder ([place])   . COPD (chronic obstructive pulmonary disease) ([place])   . Depression   . Dyspnea    with exertion   . Kidney stone   . Oxygen dependent    patient uses 2L oz PRN ; reports hardly ever uses it unless very SOB    . Rocky Mountain spotted fever 2018  . Sarcoidosis of lung ([place])    managed by Dr Lillette Boxer   . Seizures ([place]) last seizure 05/2013  . Sleep apnea    uses CPAP nightly   . UTI (urinary tract infection) 11/13/2018   see ED visit in epic ; was dc'd with oral abx and reports completed and relief of sx     Patient Active Problem List   Diagnosis Date Noted  . Atopic dermatitis 09/08/2019  . Inflammatory polyarthritis ([place]) 09/08/2019  . Acute cystitis with hematuria 09/08/2019  . Other fatigue 07/19/2019  . Hypoglycemia 07/19/2019  . Arm weakness 03/07/2019  . Influenza A 02/24/2019  . Acute upper respiratory infection 02/24/2019  . Chronic obstructive pulmonary disease ([place]) 02/24/2019  . Acute otitis externa of both ears 01/06/2019  . Mild intermittent asthma without complication 67/11/4579  . Primary osteoarthritis of hip  12/02/2018  . 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 OSA (obstructive sleep apnea) 10/13/2018  . 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 Rocky [place] spotted fever 07/02/2018  . Urinary tract infection without hematuria 07/02/2018  . Dysuria 06/23/2018  . Depression, major, recurrent, moderate ([place]) 03/26/2018  . Generalized edema 03/26/2018  . Anaphylactic syndrome 03/26/2018  . Cutaneous candidiasis 03/26/2018  . Bursitis of hip 02/03/2018  . Osteoarthritis of knee 02/03/2018  . Chronic pulmonary hypertension ([place]) 06/18/2016  . S/P cardiac cath 06/18/2016  . Unstable angina ([place]) 05/24/2016  . Bilateral leg edema 05/22/2016  . Shortness of breath 04/30/2016  . Skin lesion of right arm 03/01/2016  . Screening for breast cancer 06/10/2015  . Skin lesion of breast 06/10/2015  . Hx of seizure disorder 06/24/2014  . Left-sided weakness 06/24/2014  . Tobacco abuse 06/24/2014    Past Surgical History:  Procedure Laterality Date  . ABDOMINAL HYSTERECTOMY  1999  . BLADDER SURGERY    . CARDIAC CATHETERIZATION Bilateral 05/29/2016   Procedure: Right/Left Heart Cath and Coronary Angiography;  Surgeon: Corey Skains, MD;  Location: [facility];  Service: Cardiovascular;  Laterality: Bilateral;  . COLONOSCOPY    . COLONOSCOPY N/A 07/19/2015   Procedure: COLONOSCOPY;  Surgeon: Robert Bellow, MD;  Location: [facility] ENDOSCOPY;  Service: Endoscopy;  Laterality: N/A;  . FLEXIBLE BRONCHOSCOPY N/A 07/27/2016   Procedure: FLEXIBLE BRONCHOSCOPY;  Surgeon: Lanice Schwab  Crista Luria, MD;  Location: [facility] ORS;  Service: Pulmonary;  Laterality: N/A;  . FOOT SURGERY    . KIDNEY STONE SURGERY  7 years  . TOTAL HIP ARTHROPLASTY Left 12/02/2018   Procedure: TOTAL HIP ARTHROPLASTY ANTERIOR APPROACH;  Surgeon: Sheral Apley, MD;  Location: [facility] ORS;  Service: Orthopedics;  Laterality: Left;  . TUBAL LIGATION      Prior to Admission medications   Medication Sig Start Date End Date Taking? Authorizing  Provider  albuterol (PROVENTIL HFA;VENTOLIN HFA) 108 (90 Base) MCG/ACT inhaler Inhale 2 puffs into the lungs every 4 (four) hours as needed for wheezing or shortness of breath. 01/06/19   Carlean Jews, NP  aspirin EC 81 MG tablet Take 1 tablet (81 mg total) by mouth 2 (two) times daily. For DVT prophylaxis for 30 days after surgery. Patient taking differently: Take 81 mg by mouth daily.  12/02/18   Albina Billet III, PA-C  fluticasone (FLONASE) 50 MCG/ACT nasal spray Place 2 sprays into both nostrils daily. 02/16/19   Sherrie Mustache Roselyn Bering, PA-C  fluticasone (FLOVENT HFA) 110 MCG/ACT inhaler Inhale 2 puffs 2 x day for copd. Rinse mouth afterwards 03/30/19   Lyndon Code, MD  furosemide (LASIX) 40 MG tablet Take 1 tablet (40 mg total) by mouth daily as needed for fluid. Patient taking differently: Take 40 mg by mouth daily.  01/06/19   Carlean Jews, NP  meloxicam (MOBIC) 15 MG tablet Take 1 tablet (15 mg total) by mouth daily. 11/02/19   Carlean Jews, NP  mometasone-formoterol (DULERA) 100-5 MCG/ACT AERO Inhale 2 puffs into the lungs daily as needed for wheezing or shortness of breath. 03/10/19   Altamese Dilling, MD  neomycin-polymyxin-hydrocortisone (CORTISPORIN) 3.5-10000-1 ophthalmic suspension Insert four drops in both ears twice daily for seven days. 08/27/19   Carlean Jews, NP  nitrofurantoin, macrocrystal-monohydrate, (MACROBID) 100 MG capsule Take 1 capsule (100 mg total) by mouth 2 (two) times daily. 09/02/19   Carlean Jews, NP  sulfamethoxazole-trimethoprim (BACTRIM DS) 800-160 MG tablet Take 1 tablet by mouth 2 (two) times daily. 08/27/19   Carlean Jews, NP  triamcinolone (KENALOG) 0.025 % cream Apply 1 application topically 2 (two) times daily. 08/27/19   Carlean Jews, NP  venlafaxine XR (EFFEXOR-XR) 75 MG 24 hr capsule Take 2 capsules (150 mg total) by mouth daily with breakfast. 11/02/19   Carlean Jews, NP    Allergies Bee venom, Ciprofloxacin, and  Penicillins  Family History  Problem Relation Age of Onset  . Hypertension Mother   . Arthritis Mother   . Hypertension Father   . Arthritis Father   . Prostate cancer Father   . Ovarian cancer Maternal Grandmother     Social History Social History   Tobacco Use  . Smoking status: Current Every Day Smoker    Packs/day: 0.50    Years: 14.00    Pack years: 7.00    Types: Cigarettes    Last attempt to quit: 05/07/2016    Years since quitting: 3.6  . Smokeless tobacco: Never Used  . Tobacco comment: 11-25-18 reports  down to just 1 cigarette per day   Substance Use Topics  . Alcohol use: Yes    Alcohol/week: 0.0 standard drinks    Comment: seldom   . Drug use: No    Review of Systems Constitutional: No fever/chills Eyes: No visual changes. ENT: No sore throat. Cardiovascular: Denies chest pain. Respiratory: Denies shortness of breath. Gastrointestinal: As of abdominal pain  nausea and vomiting no diarrhea.  No constipation. Genitourinary: Negative for dysuria. Musculoskeletal: Negative for neck pain.  Negative for back pain. Integumentary: Negative for rash. Neurological: Negative for headaches, focal weakness or numbness.   ____________________________________________   PHYSICAL EXAM:  VITAL SIGNS: ED Triage Vitals  Enc Vitals Group     BP 01/05/20 1245 (!) 163/86     Pulse Rate 01/05/20 1245 60     Resp 01/05/20 1245 18     Temp 01/05/20 1245 99.6 F (37.6 C)     Temp Source 01/05/20 1245 Oral     SpO2 01/05/20 1245 100 %     Weight 01/05/20 1142 108.9 kg (240 lb)     Height 01/05/20 1142 1.676 m (5\' 6" )     Head Circumference --      Peak Flow --      Pain Score 01/05/20 1142 10     Pain Loc --      Pain Edu? --      Excl. in GC? --     Constitutional: Alert and oriented.  Eyes: Conjunctivae are normal.  Mouth/Throat: Patient is wearing a mask. Neck: No stridor.  No meningeal signs.   Cardiovascular: Normal rate, regular rhythm. Good peripheral  circulation. Grossly normal heart sounds. Respiratory: Normal respiratory effort.  No retractions. Gastrointestinal: Left lower quadrant/left lower quadrant tenderness to palpation.. Left CVA tenderness no distention.  Musculoskeletal: No lower extremity tenderness nor edema. No gross deformities of extremities. Neurologic:  Normal speech and language. No gross focal neurologic deficits are appreciated.  Skin:  Skin is warm, dry and intact. Psychiatric: Mood and affect are normal. Speech and behavior are normal.  ____________________________________________   LABS (all labs ordered are listed, but only abnormal results are displayed)  Labs Reviewed  COMPREHENSIVE METABOLIC PANEL - Abnormal; Notable for the following components:      Result Value   Glucose, Bld 131 (*)    Total Protein 8.9 (*)    All other components within normal limits  CBC - Abnormal; Notable for the following components:   WBC 10.7 (*)    All other components within normal limits  URINALYSIS, COMPLETE (UACMP) WITH MICROSCOPIC - Abnormal; Notable for the following components:   Color, Urine YELLOW (*)    APPearance TURBID (*)    Hgb urine dipstick MODERATE (*)    Protein, ur 100 (*)    Nitrite POSITIVE (*)    Leukocytes,Ua LARGE (*)    WBC, UA >50 (*)    All other components within normal limits  URINE CULTURE  RESPIRATORY PANEL BY RT PCR (FLU A&B, COVID)  LIPASE, BLOOD   ____  RADIOLOGY I, [place] N Ashlynd Michna, personally viewed and evaluated these images (plain radiographs) as part of my medical decision making, as well as reviewing the written report by the radiologist.  ED MD interpretation: 1.2 cm obstructing left UVJ stone  Official radiology report(s): CT ABDOMEN PELVIS W CONTRAST  Result Date: 01/05/2020 CLINICAL DATA:  54 year old female with history of left lower quadrant abdominal pain. EXAM: CT ABDOMEN AND PELVIS WITH CONTRAST TECHNIQUE: Multidetector CT imaging of the abdomen and pelvis was  performed using the standard protocol following bolus administration of intravenous contrast. CONTRAST:  53 OMNIPAQUE IOHEXOL 300 MG/ML  SOLN COMPARISON:  CT of the abdomen and pelvis 11/13/2018. FINDINGS: Lower chest: Unremarkable. Hepatobiliary: No suspicious cystic or solid hepatic lesions. No intra or extrahepatic biliary ductal dilatation. Gallbladder is normal in appearance. Pancreas: No pancreatic mass. No pancreatic ductal dilatation.  No pancreatic or peripancreatic fluid collections or inflammatory changes. Spleen: Unremarkable. Adrenals/Urinary Tract: At the left ureteropelvic junction there is a 1.2 cm obstructive calculus (axial image 84 of series 2) which partially prolapses into the urinary bladder because of an underlying ureterocele. This is associated with moderate proximal left hydroureteronephrosis and extensive left perinephric stranding. Right kidney is normal in appearance. Urinary bladder is otherwise unremarkable in appearance. Small bilateral adrenal nodules (right greater than left) measuring up to 2.2 x 1.4 cm on the right side, incompletely characterize, but stable on prior examinations, favored to represent benign lesions such as lipid poor adenomas. Stomach/Bowel: Normal appearance of the stomach. No pathologic dilatation of small bowel or colon. Normal appendix. Vascular/Lymphatic: Aortic atherosclerosis, without evidence of aneurysm or dissection in the abdominal or pelvic vasculature. Multiple prominent borderline enlarged para-aortic retroperitoneal lymph nodes measuring up to 8 mm in short axis, nonspecific, but likely reactive in the setting of left-sided urinary tract obstruction. No other lymphadenopathy noted elsewhere in the abdomen or pelvis. Reproductive: Status post hysterectomy. Ovaries are unremarkable in appearance. Other: Trace volume of ascites in the cul-de-sac. No pneumoperitoneum. Musculoskeletal: Status post left hip arthroplasty. There are no aggressive  appearing lytic or blastic lesions noted in the visualized portions of the skeleton. IMPRESSION: 1. Obstructive calculus at the left ureterovesicular junction within a large left ureterocele, with moderate left proximal hydroureteronephrosis. 2. Trace volume of ascites. 3. Aortic atherosclerosis. 4. Additional incidental findings, similar to prior studies, as above. Electronically Signed   By: Trudie Reed M.D.   On: 01/05/2020 14:48     Procedures   ____________________________________________   INITIAL IMPRESSION / MDM / ASSESSMENT AND PLAN / ED COURSE  As part of my medical decision making, I reviewed the following data within the electronic MEDICAL RECORD NUMBER   54 year old female presented with above-stated history and physical exam with differential diagnosis including but not limited to pyelonephritis, ureterolithiasis, less likely diverticulitis.  Reviewed the patient's urine cultures in the past revealed evidence of Proteus mirabilis on culture increasing possibility of stone and as such CT scan performed which revealed a 1.2 cm obstructing left UVJ stone.  Patient given IV ceftriaxone 1 g.  Patient given IV morphine 2 mg x 2 doses.  Patient also given IV Zofran.  Patient discussed with Dr. Lonna Cobb urologist on-call with plan for urethral stent placement.  Patient discussed with Dr. Clelia Croft hospitalist on-call who will perform admission.  ____________________________________________  FINAL CLINICAL IMPRESSION(S) / ED DIAGNOSES  Final diagnoses:  Ureterolithiasis  Pyelonephritis     MEDICATIONS GIVEN DURING THIS VISIT:  Medications  morphine 2 MG/ML injection 2 mg (2 mg Intravenous Given 01/05/20 1332)  ondansetron (ZOFRAN) injection 4 mg (4 mg Intravenous Given 01/05/20 1332)  iohexol (OMNIPAQUE) 300 MG/ML solution 100 mL (100 mLs Intravenous Contrast Given 01/05/20 1424)  cefTRIAXone (ROCEPHIN) 1 g in sodium chloride 0.9 % 100 mL IVPB (1 g Intravenous New Bag/Given 01/05/20 1459)      ED Discharge Orders    None      *Please note:  Nicole Wyatt was evaluated in Emergency Department on 01/05/2020 for the symptoms described in the history of present illness. She was evaluated in the context of the global COVID-19 pandemic, which necessitated consideration that the patient might be at risk for infection with the SARS-CoV-2 virus that causes COVID-19. Institutional protocols and algorithms that pertain to the evaluation of patients at risk for COVID-19 are in a state of rapid change based on information released by  regulatory bodies including the CDC and federal and state organizations. These policies and algorithms were followed during the patient's care in the ED.  Some ED evaluations and interventions may be delayed as a result of limited staffing during the pandemic.*  Note:  This document was prepared using Dragon voice recognition software and may include unintentional dictation errors.   Darci CurrentBrown, [place] N, MD 01/05/20 1536    Darci CurrentBrown, [place] N, MD 01/05/20 1537

## 2020-01-05 NOTE — H&P (Signed)
[facility] [place] NAME: Nicole Wyatt    MR#:  [id number]  DATE OF BIRTH:  07-21-1966  DATE OF ADMISSION:  01/05/2020  PRIMARY CARE PHYSICIAN: [facility] [facility]   REQUESTING/REFERRING PHYSICIAN: Gregor Hams, MD  CHIEF COMPLAINT:   Chief Complaint  Patient presents with  . Abdominal Pain  . Urinary Retention    HISTORY OF PRESENT ILLNESS:  Nicole Wyatt  is a 54 y.o. female with multiple medical problem is being admitted for obstructive uropathy.  Patient presented to emergency department due to acute onset of left lower quadrant 10 out of 10 abdominal pain with associated nausea and vomiting which began last night.  Patient denies any fever. Patient denies any other urinary symptoms. PAST MEDICAL HISTORY:   Past Medical History:  Diagnosis Date  . Abnormal Pap smear of cervix   . Anxiety   . Arthritis   . Bipolar 1 disorder ([place])   . COPD (chronic obstructive pulmonary disease) ([place])   . Depression   . Dyspnea    with exertion   . Kidney stone   . Oxygen dependent    patient uses 2L oz PRN ; reports hardly ever uses it unless very SOB    . Rocky [place] spotted fever 2018  . Sarcoidosis of lung ([place])    managed by Dr Lillette Boxer   . Seizures ([place]) last seizure 05/2013  . Sleep apnea    uses CPAP nightly   . UTI (urinary tract infection) 11/13/2018   see ED visit in epic ; was dc'd with oral abx and reports completed and relief of sx     PAST SURGICAL HISTORY:   Past Surgical History:  Procedure Laterality Date  . ABDOMINAL HYSTERECTOMY  1999  . BLADDER SURGERY    . CARDIAC CATHETERIZATION Bilateral 05/29/2016   Procedure: Right/Left Heart Cath and Coronary Angiography;  Surgeon: Corey Skains, MD;  Location: [facility];  Service: Cardiovascular;  Laterality: Bilateral;  . COLONOSCOPY    . COLONOSCOPY N/A 07/19/2015   Procedure: COLONOSCOPY;  Surgeon: Robert Bellow, MD;  Location: [facility] ENDOSCOPY;   Service: Endoscopy;  Laterality: N/A;  . FLEXIBLE BRONCHOSCOPY N/A 07/27/2016   Procedure: FLEXIBLE BRONCHOSCOPY;  Surgeon: Allyne Gee, MD;  Location: [facility] ORS;  Service: Pulmonary;  Laterality: N/A;  . FOOT SURGERY    . KIDNEY STONE SURGERY  7 years  . TOTAL HIP ARTHROPLASTY Left 12/02/2018   Procedure: TOTAL HIP ARTHROPLASTY ANTERIOR APPROACH;  Surgeon: Renette Butters, MD;  Location: [facility] ORS;  Service: Orthopedics;  Laterality: Left;  . TUBAL LIGATION      SOCIAL HISTORY:   Social History   Tobacco Use  . Smoking status: Current Every Day Smoker    Packs/day: 0.50    Years: 14.00    Pack years: 7.00    Types: Cigarettes    Last attempt to quit: 05/07/2016    Years since quitting: 3.6  . Smokeless tobacco: Never Used  . Tobacco comment: 11-25-18 reports  down to just 1 cigarette per day   Substance Use Topics  . Alcohol use: Yes    Alcohol/week: 0.0 standard drinks    Comment: seldom     FAMILY HISTORY:   Family History  Problem Relation Age of Onset  . Hypertension Mother   . Arthritis Mother   . Hypertension Father   . Arthritis Father   . Prostate cancer Father   . Ovarian cancer Maternal Grandmother  DRUG ALLERGIES:   Allergies  Allergen Reactions  . Bee Venom Anaphylaxis  . Ciprofloxacin Nausea Only  . Penicillins Rash and Other (See Comments)    Has patient had a PCN reaction causing immediate rash, facial/tongue/throat swelling, SOB or lightheadedness with hypotension: no Has patient had a PCN reaction causing severe rash involving mucus membranes or skin necrosis: no Has patient had a PCN reaction that required hospitalization no Has patient had a PCN reaction occurring within the last 10 years: {yes If all of the above answers are "NO", then may proceed with Cephalosporin use.     REVIEW OF SYSTEMS:   Review of Systems  Constitutional: Negative for diaphoresis, fever, malaise/fatigue and weight loss.  HENT: Negative for ear discharge, ear  pain, hearing loss, nosebleeds, sore throat and tinnitus.   Eyes: Negative for blurred vision and pain.  Respiratory: Negative for cough, hemoptysis, shortness of breath and wheezing.   Cardiovascular: Negative for chest pain, palpitations, orthopnea and leg swelling.  Gastrointestinal: Positive for abdominal pain, nausea and vomiting. Negative for blood in stool, constipation, diarrhea and heartburn.  Genitourinary: Negative for dysuria, frequency and urgency.  Musculoskeletal: Negative for back pain and myalgias.  Skin: Negative for itching and rash.  Neurological: Negative for dizziness, tingling, tremors, focal weakness, seizures, weakness and headaches.  Psychiatric/Behavioral: Negative for depression. The patient is not nervous/anxious.     MEDICATIONS AT HOME:   Prior to Admission medications   Medication Sig Start Date End Date Taking? Authorizing Provider  albuterol (PROVENTIL HFA;VENTOLIN HFA) 108 (90 Base) MCG/ACT inhaler Inhale 2 puffs into the lungs every 4 (four) hours as needed for wheezing or shortness of breath. 01/06/19   Carlean Jews, NP  aspirin EC 81 MG tablet Take 1 tablet (81 mg total) by mouth 2 (two) times daily. For DVT prophylaxis for 30 days after surgery. Patient taking differently: Take 81 mg by mouth daily.  12/02/18   Albina Billet III, PA-C  fluticasone (FLONASE) 50 MCG/ACT nasal spray Place 2 sprays into both nostrils daily. 02/16/19   Sherrie Mustache Roselyn Bering, PA-C  fluticasone (FLOVENT HFA) 110 MCG/ACT inhaler Inhale 2 puffs 2 x day for copd. Rinse mouth afterwards 03/30/19   Lyndon Code, MD  furosemide (LASIX) 40 MG tablet Take 1 tablet (40 mg total) by mouth daily as needed for fluid. Patient taking differently: Take 40 mg by mouth daily.  01/06/19   Carlean Jews, NP  meloxicam (MOBIC) 15 MG tablet Take 1 tablet (15 mg total) by mouth daily. 11/02/19   Carlean Jews, NP  mometasone-formoterol (DULERA) 100-5 MCG/ACT AERO Inhale 2 puffs into the  lungs daily as needed for wheezing or shortness of breath. 03/10/19   Altamese Dilling, MD  neomycin-polymyxin-hydrocortisone (CORTISPORIN) 3.5-10000-1 ophthalmic suspension Insert four drops in both ears twice daily for seven days. 08/27/19   Carlean Jews, NP  nitrofurantoin, macrocrystal-monohydrate, (MACROBID) 100 MG capsule Take 1 capsule (100 mg total) by mouth 2 (two) times daily. 09/02/19   Carlean Jews, NP  sulfamethoxazole-trimethoprim (BACTRIM DS) 800-160 MG tablet Take 1 tablet by mouth 2 (two) times daily. 08/27/19   Carlean Jews, NP  triamcinolone (KENALOG) 0.025 % cream Apply 1 application topically 2 (two) times daily. 08/27/19   Carlean Jews, NP  venlafaxine XR (EFFEXOR-XR) 75 MG 24 hr capsule Take 2 capsules (150 mg total) by mouth daily with breakfast. 11/02/19   Carlean Jews, NP      VITAL SIGNS:  Blood pressure Marland Kitchen)  121/50, pulse 70, temperature 98.8 F (37.1 C), resp. rate 20, height 5\' 6"  (1.676 m), weight 108.9 kg, SpO2 100 %.  PHYSICAL EXAMINATION:  Physical Exam  GENERAL:  54 y.o.-year-old patient lying in the bed with no acute distress.  EYES: Pupils equal, round, reactive to light and accommodation. No scleral icterus. Extraocular muscles intact.  HEENT: Head atraumatic, normocephalic. Oropharynx and nasopharynx clear.  NECK:  Supple, no jugular venous distention. No thyroid enlargement, no tenderness.  LUNGS: Normal breath sounds bilaterally, no wheezing, rales,rhonchi or crepitation. No use of accessory muscles of respiration.  CARDIOVASCULAR: S1, S2 normal. No murmurs, rubs, or gallops.  ABDOMEN: Soft, Left CVA tenderness +. No organomegaly or mass.  EXTREMITIES: No pedal edema, cyanosis, or clubbing.  NEUROLOGIC: Cranial nerves II through XII are intact. Muscle strength 5/5 in all extremities. Sensation intact. Gait not checked.  PSYCHIATRIC: The patient is alert and oriented x 3.  SKIN: No obvious rash, lesion, or ulcer.   LABORATORY  PANEL:   CBC Recent Labs  Lab 01/05/20 1248  WBC 10.7*  HGB 14.7  HCT 44.0  PLT 269   ------------------------------------------------------------------------------------------------------------------  Chemistries  Recent Labs  Lab 01/05/20 1248  NA 139  K 4.0  CL 103  CO2 27  GLUCOSE 131*  BUN 18  CREATININE 0.73  CALCIUM 9.2  AST 17  ALT 12  ALKPHOS 72  BILITOT 0.6   ------------------------------------------------------------------------------------------------------------------  Cardiac Enzymes No results for input(s): TROPONINI in the last 168 hours. ------------------------------------------------------------------------------------------------------------------  RADIOLOGY:  CT ABDOMEN PELVIS W CONTRAST  Result Date: 01/05/2020 CLINICAL DATA:  54 year old female with history of left lower quadrant abdominal pain. EXAM: CT ABDOMEN AND PELVIS WITH CONTRAST TECHNIQUE: Multidetector CT imaging of the abdomen and pelvis was performed using the standard protocol following bolus administration of intravenous contrast. CONTRAST:  53 OMNIPAQUE IOHEXOL 300 MG/ML  SOLN COMPARISON:  CT of the abdomen and pelvis 11/13/2018. FINDINGS: Lower chest: Unremarkable. Hepatobiliary: No suspicious cystic or solid hepatic lesions. No intra or extrahepatic biliary ductal dilatation. Gallbladder is normal in appearance. Pancreas: No pancreatic mass. No pancreatic ductal dilatation. No pancreatic or peripancreatic fluid collections or inflammatory changes. Spleen: Unremarkable. Adrenals/Urinary Tract: At the left ureteropelvic junction there is a 1.2 cm obstructive calculus (axial image 84 of series 2) which partially prolapses into the urinary bladder because of an underlying ureterocele. This is associated with moderate proximal left hydroureteronephrosis and extensive left perinephric stranding. Right kidney is normal in appearance. Urinary bladder is otherwise unremarkable in appearance.  Small bilateral adrenal nodules (right greater than left) measuring up to 2.2 x 1.4 cm on the right side, incompletely characterize, but stable on prior examinations, favored to represent benign lesions such as lipid poor adenomas. Stomach/Bowel: Normal appearance of the stomach. No pathologic dilatation of small bowel or colon. Normal appendix. Vascular/Lymphatic: Aortic atherosclerosis, without evidence of aneurysm or dissection in the abdominal or pelvic vasculature. Multiple prominent borderline enlarged para-aortic retroperitoneal lymph nodes measuring up to 8 mm in short axis, nonspecific, but likely reactive in the setting of left-sided urinary tract obstruction. No other lymphadenopathy noted elsewhere in the abdomen or pelvis. Reproductive: Status post hysterectomy. Ovaries are unremarkable in appearance. Other: Trace volume of ascites in the cul-de-sac. No pneumoperitoneum. Musculoskeletal: Status post left hip arthroplasty. There are no aggressive appearing lytic or blastic lesions noted in the visualized portions of the skeleton. IMPRESSION: 1. Obstructive calculus at the left ureterovesicular junction within a large left ureterocele, with moderate left proximal hydroureteronephrosis. 2. Trace volume of ascites.  3. Aortic atherosclerosis. 4. Additional incidental findings, similar to prior studies, as above. Electronically Signed   By: Trudie Reed M.D.   On: 01/05/2020 14:48   IMPRESSION AND PLAN:  54 year old female being admitted for obstructive uropathy  *Obstructive uropathy on the left - with large left ureteral cell and moderate left proximal hydroureteronephrosis seen on CT scan -Dr. Lonna Cobb aware and planning for stenting today -Pain management with as needed pain medication  Covid status pending    All the records are reviewed and case discussed with ED provider. Management plans discussed with the patient, family and they are in agreement.  CODE STATUS: Full code  TOTAL  TIME TAKING CARE OF THIS PATIENT: 45 minutes.    Delfino Lovett M.D on 01/05/2020 at 3:38 PM  Between 7am to 6pm - Pager - [phone]  After 6pm go to [email or web address] - password [email or web address]  [facility]   CC: Primary care physician; [facility]   Note: This dictation was prepared with Dragon dictation along with smaller phrase technology. Any transcriptional errors that result from this process are unintentional.

## 2020-01-05 NOTE — Op Note (Signed)
Preoperative diagnosis:  1. Left distal ureteral calculus with obstruction 2. Pyuria/UTI   Postoperative diagnosis:  1. Same  Procedure:  1. Cystoscopy 2. Left ureteral stent placement (6FR/22 cm) 3. Left retrograde pyelography with interpretation   Surgeon: Lorin Picket C. Raiyah Speakman, M.D.  Anesthesia: General  Complications: None  Intraoperative findings: -Left distal ureteral calculus within a ureterocele  -Left retrograde pyelogram with moderate hydronephrosis/hydroureter to the distal ureter  EBL: Minimal  Specimens: Urine from the left renal pelvis for culture  Indication: Nicole Wyatt is a 54 y.o. female who presented to the ED today with acute onset of left lower quadrant abdominal pain earlier this morning associated with nausea, vomiting, chills and lower urinary tract symptoms.  She has mild leukocytosis and urinalysis showed pyuria and was nitrite positive.  CT showed a 12 mm distal ureteral calculus within a ureterocele with moderate hydronephrosis/hydroureter.  After reviewing the management options for treatment, he elected to proceed with the above surgical procedure(s). We have discussed the potential benefits and risks of the procedure, side effects of the proposed treatment, the likelihood of the patient achieving the goals of the procedure, and any potential problems that might occur during the procedure or recuperation. Informed consent has been obtained.  Description of procedure:  The patient was taken to the operating room and general anesthesia was induced.  The patient was placed in the dorsal lithotomy position, prepped and draped in the usual sterile fashion, and preoperative antibiotics were administered. A preoperative time-out was performed.   A 21 French cystoscope was lubricated and passed per urethra.  The bladder was systematically examined in its entirety.  The urine was cloudy and the bladder was emptied and flushed several times for better  visualization.  There was no evidence of any bladder tumors.  There was mild mucosal erythema and edema of the left ureteral orifice.  A ureterocele was noted with a stone crowning at the orifice.   A 0.038 Sensor guidewire could not be advanced into the ureter.  A 5 French ureteral catheter was then placed through the cystoscope and the stone was pushed back from the orifice which was followed by a torrent of cloudy urine effluxing from the orifice.  The sensor wire was placed through the 5 French catheter and advanced up the ureter into the renal pelvis under fluoroscopic guidance.  The ureteral catheter was then advanced over guidewire to the renal pelvis where slightly cloudy urine was aspirated and sent for culture.  The guidewire was replaced in the catheter and the catheter was removed.  A 6 French/22 cm Contour ureteral stent was then placed over the guidewire without difficulty.  There was a good curl noted proximally under fluoroscopic guidance and distally under direct vision after guidewire removal.    The bladder was then emptied and the cystoscope was removed.  The patient appeared to tolerate the procedure well and without complications.  After anesthetic reversal she was transported to the PACU in stable condition.  Plan: She will be placed in observation by the hospitalist service for IV antibiotics and monitoring.  Irineo Axon, MD

## 2020-01-05 NOTE — Anesthesia Preprocedure Evaluation (Signed)
Anesthesia Evaluation  Patient identified by MRN, date of birth, ID band Patient awake    Reviewed: Allergy & Precautions, NPO status , Patient's Chart, lab work & pertinent test results  History of Anesthesia Complications Negative for: history of anesthetic complications  Airway Mallampati: III       Dental   Pulmonary asthma , sleep apnea , COPD,  COPD inhaler, Current Smoker,  Sarcoidosis          Cardiovascular (-) hypertension(-) Past MI and (-) CHF (-) Valvular Problems/Murmurs     Neuro/Psych Seizures - (pt state that she was told conflicting stories about whether she really had szs. No problems in several years and no meds),  Anxiety Depression Bipolar Disorder    GI/Hepatic Neg liver ROS, neg GERD  ,  Endo/Other  neg diabetes  Renal/GU Renal disease (stones)     Musculoskeletal   Abdominal   Peds  Hematology   Anesthesia Other Findings   Reproductive/Obstetrics                             Anesthesia Physical Anesthesia Plan  ASA: III and emergent  Anesthesia Plan: General   Post-op Pain Management:    Induction: Intravenous  PONV Risk Score and Plan: 2 and Dexamethasone and Ondansetron  Airway Management Planned: LMA  Additional Equipment:   Intra-op Plan:   Post-operative Plan:   Informed Consent: I have reviewed the patients History and Physical, chart, labs and discussed the procedure including the risks, benefits and alternatives for the proposed anesthesia with the patient or authorized representative who has indicated his/her understanding and acceptance.       Plan Discussed with:   Anesthesia Plan Comments:         Anesthesia Quick Evaluation

## 2020-01-06 ENCOUNTER — Other Ambulatory Visit: Payer: Self-pay | Admitting: Urology

## 2020-01-06 ENCOUNTER — Telehealth: Payer: Self-pay | Admitting: Urology

## 2020-01-06 DIAGNOSIS — N201 Calculus of ureter: Secondary | ICD-10-CM | POA: Diagnosis not present

## 2020-01-06 LAB — BASIC METABOLIC PANEL
Anion gap: 8 (ref 5–15)
BUN: 16 mg/dL (ref 6–20)
CO2: 25 mmol/L (ref 22–32)
Calcium: 8.5 mg/dL — ABNORMAL LOW (ref 8.9–10.3)
Chloride: 106 mmol/L (ref 98–111)
Creatinine, Ser: 0.69 mg/dL (ref 0.44–1.00)
GFR calc Af Amer: >60 mL/min (ref >60)
GFR calc non Af Amer: >60 mL/min (ref >60)
Glucose, Bld: 124 mg/dL — ABNORMAL HIGH (ref 70–99)
Potassium: 3.7 mmol/L (ref 3.5–5.1)
Sodium: 139 mmol/L (ref 135–145)

## 2020-01-06 LAB — CBC
HCT: 37.4 % (ref 36.0–46.0)
Hemoglobin: 12.3 g/dL (ref 12.0–15.0)
MCH: 30.4 pg (ref 26.0–34.0)
MCHC: 32.9 g/dL (ref 30.0–36.0)
MCV: 92.3 fL (ref 80.0–100.0)
Platelets: 250 10*3/uL (ref 150–400)
RBC: 4.05 MIL/uL (ref 3.87–5.11)
RDW: 14.3 % (ref 11.5–15.5)
WBC: 11.2 10*3/uL — ABNORMAL HIGH (ref 4.0–10.5)
nRBC: 0 % (ref 0.0–0.2)

## 2020-01-06 LAB — URINE CULTURE: Culture: NO GROWTH

## 2020-01-06 LAB — GLUCOSE, CAPILLARY: Glucose-Capillary: 83 mg/dL (ref 70–99)

## 2020-01-06 MED ORDER — OXYBUTYNIN CHLORIDE 5 MG PO TABS: 5.0000 mg | ORAL_TABLET | Freq: Three times a day (TID) | ORAL | 0 refills | Status: DC

## 2020-01-06 MED ORDER — TAMSULOSIN HCL 0.4 MG PO CAPS: 0.4000 mg | ORAL_CAPSULE | Freq: Every day | ORAL | 1 refills | Status: DC

## 2020-01-06 MED ORDER — HYDROCODONE-ACETAMINOPHEN 5-325 MG PO TABS: 1.0000 | ORAL_TABLET | Freq: Two times a day (BID) | ORAL | 0 refills | Status: AC | PRN

## 2020-01-06 MED ORDER — CEPHALEXIN 500 MG PO CAPS: 500.0000 mg | ORAL_CAPSULE | Freq: Four times a day (QID) | ORAL | 0 refills | Status: AC

## 2020-01-06 NOTE — Telephone Encounter (Signed)
Please schedule an appointment for Nicole Wyatt to discuss definitive stone treatment.  Dr. Lonna Cobb stented her yesterday.

## 2020-01-06 NOTE — Telephone Encounter (Signed)
Do you want it with you or [place]?

## 2020-01-06 NOTE — Plan of Care (Signed)
Vs stable; up ad lib; pt encouraged to call for help up if she feels lightheaded or dizzy; pt complains of no pain; tolerating regular diet; urine output has improved in color; RT has been to the room to set up CPAP

## 2020-01-06 NOTE — Telephone Encounter (Signed)
I can be with me, Sam or Dr. Lonna Cobb.

## 2020-01-06 NOTE — Progress Notes (Signed)
Urology Consult Follow Up  Subjective: Nicole Wyatt has some mild left lower quadrant tenderness otherwise she feels fine.    WBC count 11.2, serum creatinine 0.69 and UA grossly infected.  Urine culture pending.    VSS afebrile  Anti-infectives: Anti-infectives (From admission, onward)   Start     Dose/Rate Route Frequency Ordered Stop   01/05/20 1500  cefTRIAXone (ROCEPHIN) 1 g in sodium chloride 0.9 % 100 mL IVPB     1 g 200 mL/hr over 30 Minutes Intravenous  Once 01/05/20 1452 01/05/20 1540      Current Facility-Administered Medications  Medication Dose Route Frequency Provider Last Rate Last Admin  . acetaminophen (TYLENOL) tablet 650 mg  650 mg Oral Q6H PRN Max Sane, MD       Or  . acetaminophen (TYLENOL) suppository 650 mg  650 mg Rectal Q6H PRN Max Sane, MD      . albuterol (VENTOLIN HFA) 108 (90 Base) MCG/ACT inhaler 2 puff  2 puff Inhalation Q4H PRN Max Sane, MD      . bisacodyl (DULCOLAX) EC tablet 5 mg  5 mg Oral Daily PRN Max Sane, MD      . docusate sodium (COLACE) capsule 100 mg  100 mg Oral BID Max Sane, MD   100 mg at 01/05/20 2050  . enoxaparin (LOVENOX) injection 40 mg  40 mg Subcutaneous Q24H Manuella Ghazi, Vipul, MD      . fluticasone (FLONASE) 50 MCG/ACT nasal spray 2 spray  2 spray Each Nare Daily Manuella Ghazi, Vipul, MD      . HYDROcodone-acetaminophen (NORCO/VICODIN) 5-325 MG per tablet 1-2 tablet  1-2 tablet Oral Q4H PRN Max Sane, MD      . meloxicam (MOBIC) tablet 15 mg  15 mg Oral Daily Max Sane, MD   15 mg at 01/05/20 2045  . ondansetron (ZOFRAN) tablet 4 mg  4 mg Oral Q6H PRN Max Sane, MD       Or  . ondansetron (ZOFRAN) injection 4 mg  4 mg Intravenous Q6H PRN Max Sane, MD      . traZODone (DESYREL) tablet 25 mg  25 mg Oral QHS PRN Max Sane, MD      . venlafaxine XR (EFFEXOR-XR) 24 hr capsule 150 mg  150 mg Oral Q breakfast Max Sane, MD         Objective: Vital signs in last 24 hours: Temp:  [97.5 F (36.4 C)-99.6 F (37.6 C)]  97.5 F (36.4 C) (01/13 0356) Pulse Rate:  [58-92] 58 (01/13 0356) Resp:  [11-23] 18 (01/13 0356) BP: (99-165)/(50-86) 108/55 (01/13 0356) SpO2:  [95 %-100 %] 99 % (01/13 0356) Weight:  [105.7 kg-108.9 kg] 105.7 kg (01/13 0524)  Intake/Output from previous day: 01/12 0701 - 01/13 0700 In: 887.5 [I.V.:887.5] Out: 800 [Urine:800] Intake/Output this shift: No intake/output data recorded.   Physical Exam Constitutional:  Well nourished. Alert and oriented, No acute distress. HEENT: [place] AT, CPAP mask in place.  Trachea midline, no masses. Cardiovascular: No clubbing, cyanosis, or edema. Respiratory: Normal respiratory effort, no increased work of breathing. GI: Abdomen is soft, non tender, non distended, no abdominal masses. Liver and spleen not palpable.  No hernias appreciated.  Stool sample for occult testing is not indicated.   GU: No CVA tenderness.  No bladder fullness or masses.  Neurologic: Grossly intact, no focal deficits, moving all 4 extremities. Psychiatric: Normal mood and affect.   Lab Results:  Recent Labs    01/05/20 1248 01/06/20 0510  WBC 10.7* 11.2*  HGB 14.7 12.3  HCT 44.0 37.4  PLT 269 250   BMET Recent Labs    01/05/20 1248 01/06/20 0510  NA 139 139  K 4.0 3.7  CL 103 106  CO2 27 25  GLUCOSE 131* 124*  BUN 18 16  CREATININE 0.73 0.69  CALCIUM 9.2 8.5*   PT/INR No results for input(s): LABPROT, INR in the last 72 hours. ABG No results for input(s): PHART, HCO3 in the last 72 hours.  Invalid input(s): PCO2, PO2  Studies/Results: CT ABDOMEN PELVIS W CONTRAST  Result Date: 01/05/2020 CLINICAL DATA:  54 year old female with history of left lower quadrant abdominal pain. EXAM: CT ABDOMEN AND PELVIS WITH CONTRAST TECHNIQUE: Multidetector CT imaging of the abdomen and pelvis was performed using the standard protocol following bolus administration of intravenous contrast. CONTRAST:  OMNIPAQUE IOHEXOL 300 MG/ML  SOLN COMPARISON:  CT of the  abdomen and pelvis 11/13/2018. FINDINGS: Lower chest: Unremarkable. Hepatobiliary: No suspicious cystic or solid hepatic lesions. No intra or extrahepatic biliary ductal dilatation. Gallbladder is normal in appearance. Pancreas: No pancreatic mass. No pancreatic ductal dilatation. No pancreatic or peripancreatic fluid collections or inflammatory changes. Spleen: Unremarkable. Adrenals/Urinary Tract: At the left ureteropelvic junction there is a 1.2 cm obstructive calculus (axial image 84 of series 2) which partially prolapses into the urinary bladder because of an underlying ureterocele. This is associated with moderate proximal left hydroureteronephrosis and extensive left perinephric stranding. Right kidney is normal in appearance. Urinary bladder is otherwise unremarkable in appearance. Small bilateral adrenal nodules (right greater than left) measuring up to 2.2 x 1.4 cm on the right side, incompletely characterize, but stable on prior examinations, favored to represent benign lesions such as lipid poor adenomas. Stomach/Bowel: Normal appearance of the stomach. No pathologic dilatation of small bowel or colon. Normal appendix. Vascular/Lymphatic: Aortic atherosclerosis, without evidence of aneurysm or dissection in the abdominal or pelvic vasculature. Multiple prominent borderline enlarged para-aortic retroperitoneal lymph nodes measuring up to 8 mm in short axis, nonspecific, but likely reactive in the setting of left-sided urinary tract obstruction. No other lymphadenopathy noted elsewhere in the abdomen or pelvis. Reproductive: Status post hysterectomy. Ovaries are unremarkable in appearance. Other: Trace volume of ascites in the cul-de-sac. No pneumoperitoneum. Musculoskeletal: Status post left hip arthroplasty. There are no aggressive appearing lytic or blastic lesions noted in the visualized portions of the skeleton. IMPRESSION: 1. Obstructive calculus at the left ureterovesicular junction within a large  left ureterocele, with moderate left proximal hydroureteronephrosis. 2. Trace volume of ascites. 3. Aortic atherosclerosis. 4. Additional incidental findings, similar to prior studies, as above. Electronically Signed   By: Trudie Reed M.D.   On: 01/05/2020 14:48   DG OR UROLOGY CYSTO IMAGE ([facility] ONLY)  Result Date: 01/05/2020 There is no interpretation for this exam.  This order is for images obtained during a surgical procedure.  Please See "Surgeries" Tab for more information regarding the procedure.     Assessment and Plan: Mrs. Mihelich is a 54 year old female with a history of nephrolithiasis who presented with a left 12 mm calculus within an ureterocele associated with severe left hydronephrosis/hydroureter who is status post left ureteral stent placement on 01/05/2020.  - Stable for discharge from an urological standpoint   - We will arrange definitive stone treatment with either lithotripsy or ureteroscopy after discharge    LOS: 0 days    [facility] 01/06/2020

## 2020-01-06 NOTE — Progress Notes (Signed)
Patient discharged home. Discharge instructions and prescriptions given and reviewed with patient. Patient verbalized understanding. Will be escorted out by staff once daughter arrives.

## 2020-01-07 ENCOUNTER — Other Ambulatory Visit: Payer: Self-pay

## 2020-01-07 ENCOUNTER — Emergency Department
Admission: EM | Admit: 2020-01-07 | Discharge: 2020-01-07 | Disposition: A | Discharge status: Home / Self Care | Payer: Medicare Other | Attending: Emergency Medicine | Admitting: Emergency Medicine

## 2020-01-07 DIAGNOSIS — G8918 Other acute postprocedural pain: Secondary | ICD-10-CM | POA: Insufficient documentation

## 2020-01-07 DIAGNOSIS — J449 Chronic obstructive pulmonary disease, unspecified: Secondary | ICD-10-CM | POA: Diagnosis not present

## 2020-01-07 DIAGNOSIS — I10 Essential (primary) hypertension: Secondary | ICD-10-CM | POA: Diagnosis not present

## 2020-01-07 DIAGNOSIS — Z7982 Long term (current) use of aspirin: Secondary | ICD-10-CM | POA: Diagnosis not present

## 2020-01-07 DIAGNOSIS — N23 Unspecified renal colic: Secondary | ICD-10-CM | POA: Diagnosis not present

## 2020-01-07 DIAGNOSIS — F17210 Nicotine dependence, cigarettes, uncomplicated: Secondary | ICD-10-CM | POA: Insufficient documentation

## 2020-01-07 DIAGNOSIS — Z79899 Other long term (current) drug therapy: Secondary | ICD-10-CM | POA: Diagnosis not present

## 2020-01-07 DIAGNOSIS — R110 Nausea: Secondary | ICD-10-CM | POA: Diagnosis present

## 2020-01-07 LAB — COMPREHENSIVE METABOLIC PANEL
ALT: 14 U/L (ref 0–44)
AST: 16 U/L (ref 15–41)
Albumin: 4 g/dL (ref 3.5–5.0)
Alkaline Phosphatase: 58 U/L (ref 38–126)
Anion gap: 10 (ref 5–15)
BUN: 18 mg/dL (ref 6–20)
CO2: 26 mmol/L (ref 22–32)
Calcium: 9 mg/dL (ref 8.9–10.3)
Chloride: 102 mmol/L (ref 98–111)
Creatinine, Ser: 0.68 mg/dL (ref 0.44–1.00)
GFR calc Af Amer: >60 mL/min (ref >60)
GFR calc non Af Amer: >60 mL/min (ref >60)
Glucose, Bld: 91 mg/dL (ref 70–99)
Potassium: 3.7 mmol/L (ref 3.5–5.1)
Sodium: 138 mmol/L (ref 135–145)
Total Bilirubin: 0.7 mg/dL (ref 0.3–1.2)
Total Protein: 8.5 g/dL — ABNORMAL HIGH (ref 6.5–8.1)

## 2020-01-07 LAB — CBC
HCT: 39.6 % (ref 36.0–46.0)
Hemoglobin: 12.9 g/dL (ref 12.0–15.0)
MCH: 30.2 pg (ref 26.0–34.0)
MCHC: 32.6 g/dL (ref 30.0–36.0)
MCV: 92.7 fL (ref 80.0–100.0)
Platelets: 238 10*3/uL (ref 150–400)
RBC: 4.27 MIL/uL (ref 3.87–5.11)
RDW: 14.6 % (ref 11.5–15.5)
WBC: 7.2 10*3/uL (ref 4.0–10.5)
nRBC: 0 % (ref 0.0–0.2)

## 2020-01-07 LAB — LIPASE, BLOOD: Lipase: 21 U/L (ref 11–51)

## 2020-01-07 MED ORDER — ONDANSETRON HCL 4 MG/2ML IJ SOLN
4.0000 mg | Freq: Once | INTRAMUSCULAR | Status: AC
  Administered 2020-01-07: 16:00:00 4 mg via INTRAVENOUS
  Filled 2020-01-07: qty 2

## 2020-01-07 MED ORDER — IBUPROFEN 600 MG PO TABS
600.0000 mg | ORAL_TABLET | ORAL | Status: AC
  Administered 2020-01-07: 18:00:00 600 mg via ORAL
  Filled 2020-01-07: qty 1

## 2020-01-07 MED ORDER — SODIUM CHLORIDE 0.9% FLUSH
3.0000 mL | Freq: Once | INTRAVENOUS | Status: AC
  Administered 2020-01-07: 3 mL via INTRAVENOUS

## 2020-01-07 MED ORDER — FENTANYL CITRATE (PF) 100 MCG/2ML IJ SOLN
50.0000 ug | INTRAMUSCULAR | Status: DC | PRN
  Administered 2020-01-07: 15:00:00 50 ug via INTRAVENOUS
  Filled 2020-01-07: qty 2

## 2020-01-07 MED ORDER — SODIUM CHLORIDE 0.9 % IV BOLUS
500.0000 mL | Freq: Once | INTRAVENOUS | Status: AC
  Administered 2020-01-07: 16:00:00 500 mL via INTRAVENOUS

## 2020-01-07 MED ORDER — ONDANSETRON HCL 4 MG/2ML IJ SOLN
4.0000 mg | Freq: Once | INTRAMUSCULAR | Status: AC | PRN
  Administered 2020-01-07: 15:00:00 4 mg via INTRAVENOUS
  Filled 2020-01-07: qty 2

## 2020-01-07 MED ORDER — MORPHINE SULFATE (PF) 4 MG/ML IV SOLN
4.0000 mg | Freq: Once | INTRAVENOUS | Status: AC
  Administered 2020-01-07: 16:00:00 4 mg via INTRAVENOUS
  Filled 2020-01-07: qty 1

## 2020-01-07 NOTE — ED Notes (Signed)
Bladder scan reveals 275 ml of urine

## 2020-01-07 NOTE — Discharge Summary (Signed)
[facility] Discharge Summary   Patient: Nicole Wyatt PVX:[id number]  PCP: Center, [facility]  Date of admission: 01/05/2020   Date of discharge: 01/06/2020      Discharge Diagnoses:  Principal diagnosis obstructive uropathy   Active Problems:   Kidney stone  Admitted From: home Disposition:  Home  Recommendations for Outpatient Follow-up:  1. PCP: please follow up in 1 week 2. Follow up LABS/TEST: None  Follow-up Information    Center, [facility]. Schedule an appointment as soon as possible for a visit in 1 week(s).   Specialty: General Practice Contact information: [address] [place] [address] [phone]          Diet recommendation: Cardiac diet  Activity: The patient is advised to gradually reintroduce usual activities, as tolerated  Discharge Condition: stable  Code Status: Full code   History of present illness: As per the H and P dictated on admission, "Nicole Wyatt  is a 54 y.o. female with multiple medical problem is being admitted for obstructive uropathy.  Patient presented to emergency department due to acute onset of left lower quadrant 10 out of 10 abdominal pain with associated nausea and vomiting which began last night. Patient denies any fever. Patient denies any other urinary symptoms."  Hospital Course:  Summary of her active problems in the hospital is as following. 1.  Obstructive uropathy. UTI with E. coli. E faecalis also in the urine although not significant. SP left distal ureteral calculus stent placement. Pain well controlled. Patient is going to follow-up with urology outpatient. Discharged on oral Keflex. Discontinue patient's home Macrobid. Patient is on Bactrim for her left arm infection which I will continue.  2.  Mood disorder. Continue home medication.  3.  COPD. Continue inhalers. No evidence of acute exacerbation.  4.  Obesity Body mass index is 37.61 kg/m.  Outpatient  follow-up with dietitian.   Patient was ambulatory without any assistance. On the day of the discharge the patient's vitals were stable, and no other acute medical condition were reported by patient. the patient was felt safe to be discharge at Home with no therapy needed on discharge.  Consultants: Urology Procedures: Left ureteral stent placement  Discharge Exam: General: Appear in no distress, no Rash; Oral Mucosa Clear, moist. Cardiovascular: S1 and S2 Present, no Murmur, Respiratory: normal respiratory effort, Bilateral Air entry present and no Crackles, no wheezes Abdomen: Bowel Sound present, Soft and no tenderness, no hernia Extremities: no Pedal edema, no calf tenderness Neurology: alert and oriented to time, place, and person affect appropriate.  Filed Weights   01/05/20 1142 01/06/20 0524  Weight: 108.9 kg 105.7 kg   Vitals:   01/06/20 0356 01/06/20 0841  BP: (!) 108/55 113/70  Pulse: (!) 58 60  Resp: 18 17  Temp: (!) 97.5 F (36.4 C) 98.1 F (36.7 C)  SpO2: 99% 100%    DISCHARGE MEDICATION: Allergies as of 01/06/2020      Reactions   Bee Venom Anaphylaxis   Ciprofloxacin Nausea Only   Penicillins Rash, Other (See Comments)   Has patient had a PCN reaction causing immediate rash, facial/tongue/throat swelling, SOB or lightheadedness with hypotension: no Has patient had a PCN reaction causing severe rash involving mucus membranes or skin necrosis: no Has patient had a PCN reaction that required hospitalization no Has patient had a PCN reaction occurring within the last 10 years: {yes If all of the above answers are "NO", then may proceed with Cephalosporin use.  Medication List    STOP taking these medications   nitrofurantoin (macrocrystal-monohydrate) 100 MG capsule Commonly known as: MACROBID     TAKE these medications   albuterol 108 (90 Base) MCG/ACT inhaler Commonly known as: VENTOLIN HFA Inhale 2 puffs into the lungs every 4 (four) hours as  needed for wheezing or shortness of breath.   aspirin EC 81 MG tablet Take 1 tablet (81 mg total) by mouth 2 (two) times daily. For DVT prophylaxis for 30 days after surgery. What changed:   when to take this  additional instructions   cephALEXin 500 MG capsule Commonly known as: KEFLEX Take 1 capsule (500 mg total) by mouth 4 (four) times daily for 14 days.   fluticasone 110 MCG/ACT inhaler Commonly known as: Flovent HFA Inhale 2 puffs 2 x day for copd. Rinse mouth afterwards   fluticasone 50 MCG/ACT nasal spray Commonly known as: FLONASE Place 2 sprays into both nostrils daily.   furosemide 40 MG tablet Commonly known as: LASIX Take 1 tablet (40 mg total) by mouth daily as needed for fluid. What changed: when to take this   HYDROcodone-acetaminophen 5-325 MG tablet Commonly known as: NORCO/VICODIN Take 1 tablet by mouth 2 (two) times daily as needed for up to 5 days for moderate pain or severe pain.   meloxicam 15 MG tablet Commonly known as: MOBIC Take 1 tablet (15 mg total) by mouth daily.   mometasone-formoterol 100-5 MCG/ACT Aero Commonly known as: DULERA Inhale 2 puffs into the lungs daily as needed for wheezing or shortness of breath.   neomycin-polymyxin-hydrocortisone 3.5-10000-1 ophthalmic suspension Commonly known as: CORTISPORIN Insert four drops in both ears twice daily for seven days.   oxybutynin 5 MG tablet Commonly known as: DITROPAN Take 1 tablet (5 mg total) by mouth 3 (three) times daily.   sulfamethoxazole-trimethoprim 800-160 MG tablet Commonly known as: BACTRIM DS Take 1 tablet by mouth 2 (two) times daily.   tamsulosin 0.4 MG Caps capsule Commonly known as: FLOMAX Take 1 capsule (0.4 mg total) by mouth daily.   triamcinolone 0.025 % cream Commonly known as: KENALOG Apply 1 application topically 2 (two) times daily.   venlafaxine XR 75 MG 24 hr capsule Commonly known as: EFFEXOR-XR Take 2 capsules (150 mg total) by mouth daily with  breakfast.      Allergies  Allergen Reactions  . Bee Venom Anaphylaxis  . Ciprofloxacin Nausea Only  . Penicillins Rash and Other (See Comments)    Has patient had a PCN reaction causing immediate rash, facial/tongue/throat swelling, SOB or lightheadedness with hypotension: no Has patient had a PCN reaction causing severe rash involving mucus membranes or skin necrosis: no Has patient had a PCN reaction that required hospitalization no Has patient had a PCN reaction occurring within the last 10 years: {yes If all of the above answers are "NO", then may proceed with Cephalosporin use.    Discharge Instructions    Diet - low sodium heart healthy   Complete by: As directed    Increase activity slowly   Complete by: As directed       The results of significant diagnostics from this hospitalization (including imaging, microbiology, ancillary and laboratory) are listed below for reference.    Significant Diagnostic Studies: CT ABDOMEN PELVIS W CONTRAST  Result Date: 01/05/2020 CLINICAL DATA:  54 year old female with history of left lower quadrant abdominal pain. EXAM: CT ABDOMEN AND PELVIS WITH CONTRAST TECHNIQUE: Multidetector CT imaging of the abdomen and pelvis was performed using the standard protocol  following bolus administration of intravenous contrast. CONTRAST:  OMNIPAQUE IOHEXOL 300 MG/ML  SOLN COMPARISON:  CT of the abdomen and pelvis 11/13/2018. FINDINGS: Lower chest: Unremarkable. Hepatobiliary: No suspicious cystic or solid hepatic lesions. No intra or extrahepatic biliary ductal dilatation. Gallbladder is normal in appearance. Pancreas: No pancreatic mass. No pancreatic ductal dilatation. No pancreatic or peripancreatic fluid collections or inflammatory changes. Spleen: Unremarkable. Adrenals/Urinary Tract: At the left ureteropelvic junction there is a 1.2 cm obstructive calculus (axial image 84 of series 2) which partially prolapses into the urinary bladder because of an  underlying ureterocele. This is associated with moderate proximal left hydroureteronephrosis and extensive left perinephric stranding. Right kidney is normal in appearance. Urinary bladder is otherwise unremarkable in appearance. Small bilateral adrenal nodules (right greater than left) measuring up to 2.2 x 1.4 cm on the right side, incompletely characterize, but stable on prior examinations, favored to represent benign lesions such as lipid poor adenomas. Stomach/Bowel: Normal appearance of the stomach. No pathologic dilatation of small bowel or colon. Normal appendix. Vascular/Lymphatic: Aortic atherosclerosis, without evidence of aneurysm or dissection in the abdominal or pelvic vasculature. Multiple prominent borderline enlarged para-aortic retroperitoneal lymph nodes measuring up to 8 mm in short axis, nonspecific, but likely reactive in the setting of left-sided urinary tract obstruction. No other lymphadenopathy noted elsewhere in the abdomen or pelvis. Reproductive: Status post hysterectomy. Ovaries are unremarkable in appearance. Other: Trace volume of ascites in the cul-de-sac. No pneumoperitoneum. Musculoskeletal: Status post left hip arthroplasty. There are no aggressive appearing lytic or blastic lesions noted in the visualized portions of the skeleton. IMPRESSION: 1. Obstructive calculus at the left ureterovesicular junction within a large left ureterocele, with moderate left proximal hydroureteronephrosis. 2. Trace volume of ascites. 3. Aortic atherosclerosis. 4. Additional incidental findings, similar to prior studies, as above. Electronically Signed   By: Trudie Reed M.D.   On: 01/05/2020 14:48   DG OR UROLOGY CYSTO IMAGE ([facility] ONLY)  Result Date: 01/05/2020 There is no interpretation for this exam.  This order is for images obtained during a surgical procedure.  Please See "Surgeries" Tab for more information regarding the procedure.    Microbiology: Recent Results (from the past 240  hour(s))  Urine culture     Status: Abnormal (Preliminary result)   Collection Time: 01/05/20 12:48 PM   Specimen: Urine, Clean Catch  Result Value Ref Range Status   Specimen Description   Final    URINE, CLEAN CATCH Performed at [facility], [address], [place], [place] [address]    Special Requests   Final    NONE Performed at [facility], [address], [place], [place] [address]    Culture (A)  Final    >=100,000 COLONIES/mL ESCHERICHIA COLI 50,000 COLONIES/mL ENTEROCOCCUS FAECALIS SUSCEPTIBILITIES TO FOLLOW Performed at [facility], [address], [place], [place] [address]    Report Status PENDING  Incomplete   Organism ID, Bacteria ESCHERICHIA COLI (A)  Final      Susceptibility   Escherichia coli - MIC*    AMPICILLIN <=2 SENSITIVE Sensitive     CEFAZOLIN <=4 SENSITIVE Sensitive     CEFTRIAXONE <=0.25 SENSITIVE Sensitive     CIPROFLOXACIN >=4 RESISTANT Resistant     GENTAMICIN <=1 SENSITIVE Sensitive     IMIPENEM <=0.25 SENSITIVE Sensitive     NITROFURANTOIN <=16 SENSITIVE Sensitive     TRIMETH/SULFA >=320 RESISTANT Resistant     AMPICILLIN/SULBACTAM <=2 SENSITIVE Sensitive     PIP/TAZO <=4 SENSITIVE Sensitive     * >=  100,000 COLONIES/mL ESCHERICHIA COLI  Respiratory Panel by RT PCR (Flu A&B, Covid) - Nasopharyngeal Swab     Status: None   Collection Time: 01/05/20  3:20 PM   Specimen: Nasopharyngeal Swab  Result Value Ref Range Status   SARS Coronavirus 2 by RT PCR NEGATIVE NEGATIVE Final    Comment: (NOTE) SARS-CoV-2 target nucleic acids are NOT DETECTED. The SARS-CoV-2 RNA is generally detectable in upper respiratoy specimens during the acute phase of infection. The lowest concentration of SARS-CoV-2 viral copies this assay can detect is 131 copies/mL. A negative result does not preclude SARS-Cov-2 infection and should not be used as the sole basis for treatment or other patient management decisions. A negative result may  occur with  improper specimen collection/handling, submission of specimen other than nasopharyngeal swab, presence of viral mutation(s) within the areas targeted by this assay, and inadequate number of viral copies (<131 copies/mL). A negative result must be combined with clinical observations, patient history, and epidemiological information. The expected result is Negative. Fact Sheet for Patients:  https://www.moore.com/https://www.fda.gov/media/142436/download Fact Sheet for Healthcare Providers:  https://www.young.biz/https://www.fda.gov/media/142435/download This test is not yet ap proved or cleared by the [place] FDA and  has been authorized for detection and/or diagnosis of SARS-CoV-2 by FDA under an Emergency Use Authorization (EUA). This EUA will remain  in effect (meaning this test can be used) for the duration of the COVID-19 declaration under Section 564(b)(1) of the Act, 21 U.S.C. section 360bbb-3(b)(1), unless the authorization is terminated or revoked sooner.    Influenza A by PCR NEGATIVE NEGATIVE Final   Influenza B by PCR NEGATIVE NEGATIVE Final    Comment: (NOTE) The Xpert Xpress SARS-CoV-2/FLU/RSV assay is intended as an aid in  the diagnosis of influenza from Nasopharyngeal swab specimens and  should not be used as a sole basis for treatment. Nasal washings and  aspirates are unacceptable for Xpert Xpress SARS-CoV-2/FLU/RSV  testing. Fact Sheet for Patients: https://www.moore.com/https://www.fda.gov/media/142436/download Fact Sheet for Healthcare Providers: https://www.young.biz/https://www.fda.gov/media/142435/download This test is not yet approved or cleared by the [place] FDA and  has been authorized for detection and/or diagnosis of SARS-CoV-2 by  FDA under an Emergency Use Authorization (EUA). This EUA will remain  in effect (meaning this test can be used) for the duration of the  Covid-19 declaration under Section 564(b)(1) of the Act, 21  U.S.C. section 360bbb-3(b)(1), unless the authorization is  terminated or  revoked. Performed at [facility], [address] [place], [place] [address]   Urine Culture     Status: None   Collection Time: 01/05/20  5:30 PM   Specimen: PATH Cytology Urine  Result Value Ref Range Status   Specimen Description   Final    URINE, RANDOM CYTO Performed at [facility], [address] [place], [place] [address]    Special Requests   Final    NONE Performed at [facility], [address] [place], [place] [address]    Culture   Final    NO GROWTH Performed at [facility], [address] [place], [place] [address]    Report Status 01/06/2020 FINAL  Final     Labs: CBC: Recent Labs  Lab 01/05/20 1248 01/06/20 0510 01/07/20 1510  WBC 10.7* 11.2* 7.2  HGB 14.7 12.3 12.9  HCT 44.0 37.4 39.6  MCV 90.9 92.3 92.7  PLT 269 250 238   Basic Metabolic Panel: Recent Labs  Lab 01/05/20 1248 01/06/20 0510 01/07/20 1510  NA 139 139 138  K  4.0 3.7 3.7  CL 103 106 102  CO2 27 25 26   GLUCOSE 131* 124* 91  BUN 18 16 18   CREATININE 0.73 0.69 0.68  CALCIUM 9.2 8.5* 9.0   Liver Function Tests: Recent Labs  Lab 01/05/20 1248 01/07/20 1510  AST 17 16  ALT 12 14  ALKPHOS 72 58  BILITOT 0.6 0.7  PROT 8.9* 8.5*  ALBUMIN 4.3 4.0   Recent Labs  Lab 01/05/20 1248 01/07/20 1510  LIPASE 23 21   No results for input(s): AMMONIA in the last 168 hours. Cardiac Enzymes: No results for input(s): CKTOTAL, CKMB, CKMBINDEX, TROPONINI in the last 168 hours. BNP (last 3 results) No results for input(s): BNP in the last 8760 hours. CBG: Recent Labs  Lab 01/06/20 0839  GLUCAP 83    Time spent: 35 minutes  Signed:  Berle Mull  [facility] 01/06/2020 3:38 PM

## 2020-01-07 NOTE — Telephone Encounter (Signed)
She has a stent but did have a UTI.  It looks like she was discharged on antibiotics.  Would recommend she be initially evaluated in the ED.

## 2020-01-07 NOTE — ED Triage Notes (Signed)
Pt states she was admitted with kidney stone blockage and had a renal stent placed on Tuesday and was discharged yesterday, states last night and today having nausea, has not passed urine today and is jut feeling bad in general with chills.

## 2020-01-07 NOTE — Telephone Encounter (Signed)
Pt called and has started having nausea vomiting and chills. She is unable to keep anything down. Please advise.

## 2020-01-07 NOTE — ED Provider Notes (Signed)
[facility] Emergency Department Provider Note   ____________________________________________   First MD Initiated Contact with Patient 01/07/20 1607     (approximate)  I have reviewed the triage vital signs and the nursing notes.   HISTORY  Chief Complaint Post-op Problem    HPI Nicole Wyatt is a 54 y.o. female for evaluation of ongoing left lower/flank pain and nausea.   Patient reports that since leaving the hospital she is having pain discomfort left lower quadrant.  Associate with nausea.  She reports has been dry heaving quite a lot due to the nausea.  Moderate pain.  No fevers or chills.  Still able to eat and drink.  Her bowels do not feel constipated, she had a normal bowel movement this morning and last urinated about 8 AM but has a constant sensation as though she needs to empty her bladder  Recent postop with urology, she discussed with Dr. Diamantina Providence recommend she come for evaluation  Past Medical History:  Diagnosis Date  . Abnormal Pap smear of cervix   . Anxiety   . Arthritis   . Bipolar 1 disorder ([place])   . COPD (chronic obstructive pulmonary disease) ([place])   . Depression   . Dyspnea    with exertion   . Kidney stone   . Oxygen dependent    patient uses 2L oz PRN ; reports hardly ever uses it unless very SOB    . Rocky [place] spotted fever 2018  . Sarcoidosis of lung ([place])    managed by Dr Lillette Boxer   . Seizures ([place]) last seizure 05/2013  . Sleep apnea    uses CPAP nightly   . UTI (urinary tract infection) 11/13/2018   see ED visit in epic ; was dc'd with oral abx and reports completed and relief of sx     Patient Active Problem List   Diagnosis Date Noted  . Kidney stone 01/05/2020  . Atopic dermatitis 09/08/2019  . Inflammatory polyarthritis ([place]) 09/08/2019  . Acute cystitis with hematuria 09/08/2019  . Other fatigue 07/19/2019  . Hypoglycemia 07/19/2019  . Arm weakness 03/07/2019  . Influenza A 02/24/2019  .  Acute upper respiratory infection 02/24/2019  . Chronic obstructive pulmonary disease ([place]) 02/24/2019  . Acute otitis externa of both ears 01/06/2019  . Mild intermittent asthma without complication [id number]/[id number]  . Primary osteoarthritis of hip 12/02/2018  . 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 OSA (obstructive sleep apnea) 10/13/2018  . 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 Rocky [place] spotted fever 07/02/2018  . Urinary tract infection without hematuria 07/02/2018  . Dysuria 06/23/2018  . Depression, major, recurrent, moderate ([place]) 03/26/2018  . Generalized edema 03/26/2018  . Anaphylactic syndrome 03/26/2018  . Cutaneous candidiasis 03/26/2018  . Bursitis of hip 02/03/2018  . Osteoarthritis of knee 02/03/2018  . Chronic pulmonary hypertension ([place]) 06/18/2016  . S/P cardiac cath 06/18/2016  . Unstable angina ([place]) 05/24/2016  . Bilateral leg edema 05/22/2016  . Shortness of breath 04/30/2016  . Skin lesion of right arm 03/01/2016  . Screening for breast cancer 06/10/2015  . Skin lesion of breast 06/10/2015  . Hx of seizure disorder 06/24/2014  . Left-sided weakness 06/24/2014  . Tobacco abuse 06/24/2014    Past Surgical History:  Procedure Laterality Date  . ABDOMINAL HYSTERECTOMY  1999  . BLADDER SURGERY    . CARDIAC CATHETERIZATION Bilateral 05/29/2016   Procedure: Right/Left Heart Cath and Coronary Angiography;  Surgeon: Corey Skains, MD;  Location: [facility];  Service: Cardiovascular;  Laterality: Bilateral;  . COLONOSCOPY    . COLONOSCOPY N/A 07/19/2015   Procedure: COLONOSCOPY;  Surgeon: Earline Mayotte, MD;  Location: [facility] ENDOSCOPY;  Service: Endoscopy;  Laterality: N/A;  . CYSTOSCOPY WITH STENT PLACEMENT Left 01/05/2020   Procedure: CYSTOSCOPY WITH STENT PLACEMENT;  Surgeon: Riki Altes, MD;  Location: [facility] ORS;  Service: Urology;  Laterality: Left;  . FLEXIBLE BRONCHOSCOPY N/A 07/27/2016   Procedure: FLEXIBLE BRONCHOSCOPY;  Surgeon: Yevonne Pax, MD;  Location: [facility] ORS;  Service: Pulmonary;  Laterality: N/A;  . FOOT SURGERY    . KIDNEY STONE SURGERY  7 years  . TOTAL HIP ARTHROPLASTY Left 12/02/2018   Procedure: TOTAL HIP ARTHROPLASTY ANTERIOR APPROACH;  Surgeon: Sheral Apley, MD;  Location: [facility] ORS;  Service: Orthopedics;  Laterality: Left;  . TUBAL LIGATION      Prior to Admission medications   Medication Sig Start Date End Date Taking? Authorizing Provider  albuterol (PROVENTIL HFA;VENTOLIN HFA) 108 (90 Base) MCG/ACT inhaler Inhale 2 puffs into the lungs every 4 (four) hours as needed for wheezing or shortness of breath. 01/06/19   Carlean Jews, NP  aspirin EC 81 MG tablet Take 1 tablet (81 mg total) by mouth 2 (two) times daily. For DVT prophylaxis for 30 days after surgery. Patient taking differently: Take 81 mg by mouth daily.  12/02/18   Albina Billet III, PA-C  cephALEXin (KEFLEX) 500 MG capsule Take 1 capsule (500 mg total) by mouth 4 (four) times daily for 14 days. 01/06/20 01/20/20  Michiel Cowboy A, PA-C  fluticasone (FLONASE) 50 MCG/ACT nasal spray Place 2 sprays into both nostrils daily. 02/16/19   Sherrie Mustache Roselyn Bering, PA-C  fluticasone (FLOVENT HFA) 110 MCG/ACT inhaler Inhale 2 puffs 2 x day for copd. Rinse mouth afterwards 03/30/19   Lyndon Code, MD  furosemide (LASIX) 40 MG tablet Take 1 tablet (40 mg total) by mouth daily as needed for fluid. Patient taking differently: Take 40 mg by mouth daily.  01/06/19   Carlean Jews, NP  HYDROcodone-acetaminophen (NORCO/VICODIN) 5-325 MG tablet Take 1 tablet by mouth 2 (two) times daily as needed for up to 5 days for moderate pain or severe pain. 01/06/20 01/11/20  Rolly Salter, MD  meloxicam (MOBIC) 15 MG tablet Take 1 tablet (15 mg total) by mouth daily. 11/02/19   Carlean Jews, NP  mometasone-formoterol (DULERA) 100-5 MCG/ACT AERO Inhale 2 puffs into the lungs daily as needed for wheezing or shortness of breath. 03/10/19   Altamese Dilling, MD   neomycin-polymyxin-hydrocortisone (CORTISPORIN) 3.5-10000-1 ophthalmic suspension Insert four drops in both ears twice daily for seven days. 08/27/19   Carlean Jews, NP  oxybutynin (DITROPAN) 5 MG tablet Take 1 tablet (5 mg total) by mouth 3 (three) times daily. 01/06/20   Michiel Cowboy A, PA-C  sulfamethoxazole-trimethoprim (BACTRIM DS) 800-160 MG tablet Take 1 tablet by mouth 2 (two) times daily. 08/27/19   Carlean Jews, NP  tamsulosin (FLOMAX) 0.4 MG CAPS capsule Take 1 capsule (0.4 mg total) by mouth daily. 01/06/20   Michiel Cowboy A, PA-C  triamcinolone (KENALOG) 0.025 % cream Apply 1 application topically 2 (two) times daily. 08/27/19   Carlean Jews, NP  venlafaxine XR (EFFEXOR-XR) 75 MG 24 hr capsule Take 2 capsules (150 mg total) by mouth daily with breakfast. 11/02/19   Carlean Jews, NP    Allergies Bee venom, Ciprofloxacin, and Penicillins  Family History  Problem Relation Age of Onset  .  Hypertension Mother   . Arthritis Mother   . Hypertension Father   . Arthritis Father   . Prostate cancer Father   . Ovarian cancer Maternal Grandmother     Social History Social History   Tobacco Use  . Smoking status: Current Every Day Smoker    Packs/day: 0.50    Years: 14.00    Pack years: 7.00    Types: Cigarettes    Last attempt to quit: 05/07/2016    Years since quitting: 3.6  . Smokeless tobacco: Never Used  . Tobacco comment: 11-25-18 reports  down to just 1 cigarette per day   Substance Use Topics  . Alcohol use: Yes    Alcohol/week: 0.0 standard drinks    Comment: seldom   . Drug use: No    Review of Systems Constitutional: No fever/chills Eyes: No visual changes. ENT: No sore throat. Cardiovascular: Denies chest pain. Respiratory: Denies shortness of breath. Gastrointestinal: Discomfort in the left lower pelvis flank area, similar to where she had stone pain.  No pain on the right.  Moving her bowels and passing flatus.  No vomiting, but she  describes as really dry heaving. Genitourinary: Negative for dysuria.  Constant urge to urinate Musculoskeletal: Negative for back pain. Skin: Negative for rash. Neurological: Negative for headaches, areas of focal weakness or numbness.    ____________________________________________   PHYSICAL EXAM:  VITAL SIGNS: ED Triage Vitals  Enc Vitals Group     BP 01/07/20 1505 (!) 143/69     Pulse Rate 01/07/20 1505 62     Resp 01/07/20 1505 17     Temp 01/07/20 1505 98.9 F (37.2 C)     Temp Source 01/07/20 1505 Oral     SpO2 01/07/20 1505 98 %     Weight 01/07/20 1507 233 lb (105.7 kg)     Height 01/07/20 1507 5\' 6"  (1.676 m)     Head Circumference --      Peak Flow --      Pain Score 01/07/20 1507 10     Pain Loc --      Pain Edu? --      Excl. in GC? --     Constitutional: Alert and oriented.  Mildly ill-appearing, no acute distress but appears nauseated fatigued Eyes: Conjunctivae are normal. Head: Atraumatic. Nose: No congestion/rhinnorhea. Mouth/Throat: Mucous membranes are moist. Neck: No stridor.  Cardiovascular: Normal rate, regular rhythm. Grossly normal heart sounds.  Good peripheral circulation. Respiratory: Normal respiratory effort.  No retractions. Lungs CTAB. Gastrointestinal: Soft and nontender for some discomfort in the left flank region.  No rebound or guarding.  No right-sided abdominal pain.  Abdomen soft nondistended Musculoskeletal: No lower extremity tenderness nor edema. Neurologic:  Normal speech and language. No gross focal neurologic deficits are appreciated.  Skin:  Skin is warm, dry and intact. No rash noted. Psychiatric: Mood and affect are normal. Speech and behavior are normal.  ____________________________________________   LABS (all labs ordered are listed, but only abnormal results are displayed)  Labs Reviewed  COMPREHENSIVE METABOLIC PANEL - Abnormal; Notable for the following components:      Result Value   Total Protein 8.5 (*)     All other components within normal limits  LIPASE, BLOOD  CBC   ____________________________________________  EKG   ____________________________________________  RADIOLOGY   ____________________________________________   PROCEDURES  Procedure(s) performed: None  Procedures  Critical Care performed: No  ____________________________________________   INITIAL IMPRESSION / ASSESSMENT AND PLAN / ED COURSE  Pertinent labs &  imaging results that were available during my care of the patient were reviewed by me and considered in my medical decision making (see chart for details).   Patient representing with pain similar to when she had a kidney stone, recently had ureteral stenting and was placed on culture appropriate cephalexin.  She is feeling nauseated with dry heaves.  Some left-sided abdominal discomfort and sensation to void.  Based on the clinical history, may have some ongoing symptomatology from pyelonephritis, but also would consider possible ureteral spasm, irritation, urgency secondary to his renal stent,.  Her lab work reassuring, creatinine reassuring white count reassuring.  Given she is on antibiotic which appropriate based on her culture.  Case reviewed with nephrology, Dr. Richardo Hanks  Clinical Course as of Jan 06 1801  Thu Jan 07, 2020  1625 Per Dr. Richardo Hanks: with normal renal function and no leukocytosis I dont think any imaging is warranted, stent looks great from intra-op KUB 1/12. Would recommend continuing abx, flomax 0.4mg  nightly for stent pain, NSAIDs PRN for stent pain, and zofran. I would be she has some resolving pyelo- but abscess unlikely with no leukocytosis and normal vitals   [MQ]    Clinical Course User Index [MQ] Sharyn Creamer, MD    ----------------------------------------- 6:03 PM on 01/07/2020 -----------------------------------------  Reevaluated.  Patient reports she is feeling quite a bit better.  Discussed with her that reviewed her  case with urologist.  She is comfortable continuing her antibiotics and current medications.  We will also add on ibuprofen over-the-counter as needed for the next few days as well.  Plans to follow-up with urologist.  She is alert oriented, appears appropriate for discharge.  No distress feeling much improved.  Has someone coming to pick her up  Return precautions and treatment recommendations and follow-up discussed with the patient who is agreeable with the plan.  ____________________________________________   FINAL CLINICAL IMPRESSION(S) / ED DIAGNOSES  Final diagnoses:  Renal colic on left side        Note:  This document was prepared using Dragon voice recognition software and may include unintentional dictation errors       Sharyn Creamer, MD 01/07/20 1803

## 2020-01-08 ENCOUNTER — Encounter: Payer: Self-pay | Admitting: Cardiology

## 2020-01-08 ENCOUNTER — Ambulatory Visit (INDEPENDENT_AMBULATORY_CARE_PROVIDER_SITE_OTHER): Payer: Medicare Other | Admitting: Cardiology

## 2020-01-08 VITALS — BP 118/70 | HR 67 | Ht 67.0 in | Wt 237.2 lb

## 2020-01-08 DIAGNOSIS — F17200 Nicotine dependence, unspecified, uncomplicated: Secondary | ICD-10-CM | POA: Diagnosis not present

## 2020-01-08 DIAGNOSIS — R011 Cardiac murmur, unspecified: Secondary | ICD-10-CM | POA: Diagnosis not present

## 2020-01-08 LAB — URINE CULTURE: Culture: >=100000 — AB

## 2020-01-08 NOTE — Progress Notes (Signed)
Cardiology Office Note:    Date:  01/08/2020   ID:  Nicole Wyatt, DOB [birth year], MRN [id number]  PCP:  [facility]  Cardiologist:  Debbe Odea, MD  Electrophysiologist:  None   Referring MD: Lorn Junes, FNP   Chief Complaint  Patient presents with  . New Patient (Initial Visit)    Ref by Lorenda Cahill, NP for heart murmur. Meds reviewed by the pt. verbally. Pt. c/o chest discomfort, shortness of breath, palpitations and rapid irreg. heart beats.     History of Present Illness:    Nicole Wyatt is a 54 y.o. female with a hx of COPD, anxiety, pulmonary sarcoid diagnosed 5 years ago, current smoker who presents due to heart murmur.  Patient denies any personal history of cardiac disease.  Denies any chest pain or shortness of breath at rest or with exertion.  Her mother passed away recently from congestive heart failure.  She is going through the stresses of losing both parents within the past year.  He is to see a pulmonologist in [facility] for the pulmonologist moved her practice.  Due to her parents becoming sick of late she had put follow-up care on hold.  Past Medical History:  Diagnosis Date  . Abnormal Pap smear of cervix   . Anxiety   . Arthritis   . Bipolar 1 disorder (HCC)   . COPD (chronic obstructive pulmonary disease) (HCC)   . Depression   . Dyspnea    with exertion   . Kidney stone   . Oxygen dependent    patient uses 2L oz PRN ; reports hardly ever uses it unless very SOB    . [place] spotted fever 2018  . Sarcoidosis of lung (HCC)    managed by Dr Lindie Spruce   . Seizures (HCC) last seizure 05/2013  . Sleep apnea    uses CPAP nightly   . UTI (urinary tract infection) 11/13/2018   see ED visit in epic ; was dc'd with oral abx and reports completed and relief of sx     Past Surgical History:  Procedure Laterality Date  . ABDOMINAL HYSTERECTOMY  1999  . BLADDER SURGERY    . CARDIAC CATHETERIZATION Bilateral 05/29/2016     Procedure: Right/Left Heart Cath and Coronary Angiography;  Surgeon: Lamar Blinks, MD;  Location: [facility] INVASIVE CV LAB;  Service: Cardiovascular;  Laterality: Bilateral;  . COLONOSCOPY    . COLONOSCOPY N/A 07/19/2015   Procedure: COLONOSCOPY;  Surgeon: Earline Mayotte, MD;  Location: [facility] ENDOSCOPY;  Service: Endoscopy;  Laterality: N/A;  . CYSTOSCOPY WITH STENT PLACEMENT Left 01/05/2020   Procedure: CYSTOSCOPY WITH STENT PLACEMENT;  Surgeon: Riki Altes, MD;  Location: [facility] ORS;  Service: Urology;  Laterality: Left;  . FLEXIBLE BRONCHOSCOPY N/A 07/27/2016   Procedure: FLEXIBLE BRONCHOSCOPY;  Surgeon: Yevonne Pax, MD;  Location: [facility] ORS;  Service: Pulmonary;  Laterality: N/A;  . FOOT SURGERY    . KIDNEY STONE SURGERY  7 years  . TOTAL HIP ARTHROPLASTY Left 12/02/2018   Procedure: TOTAL HIP ARTHROPLASTY ANTERIOR APPROACH;  Surgeon: Sheral Apley, MD;  Location: [facility] ORS;  Service: Orthopedics;  Laterality: Left;  . TUBAL LIGATION      Current Medications: Current Meds  Medication Sig  . albuterol (PROVENTIL HFA;VENTOLIN HFA) 108 (90 Base) MCG/ACT inhaler Inhale 2 puffs into the lungs every 4 (four) hours as needed for wheezing or shortness of breath.  Marland Kitchen aspirin EC 81 MG tablet Take 1 tablet (  81 mg total) by mouth 2 (two) times daily. For DVT prophylaxis for 30 days after surgery. (Patient taking differently: Take 81 mg by mouth daily. )  . cephALEXin (KEFLEX) 500 MG capsule Take 1 capsule (500 mg total) by mouth 4 (four) times daily for 14 days.  . fluticasone (FLONASE) 50 MCG/ACT nasal spray Place 2 sprays into both nostrils daily.  . fluticasone (FLOVENT HFA) 110 MCG/ACT inhaler Inhale 2 puffs 2 x day for copd. Rinse mouth afterwards  . furosemide (LASIX) 40 MG tablet Take 1 tablet (40 mg total) by mouth daily as needed for fluid. (Patient taking differently: Take 40 mg by mouth daily. )  . HYDROcodone-acetaminophen (NORCO/VICODIN) 5-325 MG tablet Take 1 tablet by mouth 2  (two) times daily as needed for up to 5 days for moderate pain or severe pain.  . meloxicam (MOBIC) 15 MG tablet Take 1 tablet (15 mg total) by mouth daily.  . mometasone-formoterol (DULERA) 100-5 MCG/ACT AERO Inhale 2 puffs into the lungs daily as needed for wheezing or shortness of breath.  . neomycin-polymyxin-hydrocortisone (CORTISPORIN) 3.5-10000-1 ophthalmic suspension Insert four drops in both ears twice daily for seven days.  Marland Kitchen oxybutynin (DITROPAN) 5 MG tablet Take 1 tablet (5 mg total) by mouth 3 (three) times daily.  Marland Kitchen sulfamethoxazole-trimethoprim (BACTRIM DS) 800-160 MG tablet Take 1 tablet by mouth 2 (two) times daily.  . tamsulosin (FLOMAX) 0.4 MG CAPS capsule Take 1 capsule (0.4 mg total) by mouth daily.  Marland Kitchen triamcinolone (KENALOG) 0.025 % cream Apply 1 application topically 2 (two) times daily.  Marland Kitchen venlafaxine XR (EFFEXOR-XR) 75 MG 24 hr capsule Take 2 capsules (150 mg total) by mouth daily with breakfast.      Allergies:   Bee venom, Ciprofloxacin, and Penicillins   Social History   Socioeconomic History  . Marital status: Single    Spouse name: Not on file  . Number of children: Not on file  . Years of education: Not on file  . Highest education level: Not on file  Occupational History  . Not on file  Tobacco Use  . Smoking status: Current Every Day Smoker    Packs/day: 0.50    Years: 14.00    Pack years: 7.00    Types: Cigarettes    Last attempt to quit: 05/07/2016    Years since quitting: 3.6  . Smokeless tobacco: Never Used  . Tobacco comment: 11-25-18 reports  down to just 1 cigarette per day   Substance and Sexual Activity  . Alcohol use: Yes    Alcohol/week: 0.0 standard drinks    Comment: seldom   . Drug use: No  . Sexual activity: Never  Other Topics Concern  . Not on file  Social History Narrative  . Not on file   Social Determinants of Health   Financial Resource Strain:   . Difficulty of Paying Living Expenses: Not on file  Food Insecurity:    . Worried About Programme researcher, broadcasting/film/video in the Last Year: Not on file  . Ran Out of Food in the Last Year: Not on file  Transportation Needs:   . Lack of Transportation (Medical): Not on file  . Lack of Transportation (Non-Medical): Not on file  Physical Activity:   . Days of Exercise per Week: Not on file  . Minutes of Exercise per Session: Not on file  Stress:   . Feeling of Stress : Not on file  Social Connections:   . Frequency of Communication with Friends and Family: Not  on file  . Frequency of Social Gatherings with Friends and Family: Not on file  . Attends Religious Services: Not on file  . Active Member of Clubs or Organizations: Not on file  . Attends BankerClub or Organization Meetings: Not on file  . Marital Status: Not on file     Family History: The patient's family history includes Arrhythmia (age of onset: 7868) in her father; Arthritis in her father and mother; Heart attack in her father; Heart disease in her father and mother; Heart failure in her mother; Hypertension in her father and mother; Ovarian cancer in her maternal grandmother; Prostate cancer in her father.  ROS:   Please see the history of present illness.     All other systems reviewed and are negative.  EKGs/Labs/Other Studies Reviewed:    The following studies were reviewed today:   EKG:  EKG is  ordered today.  The ekg ordered today demonstrates sinus rhythm, right bundle branch block, left anterior fascicular block.  Recent Labs: 07/20/2019: TSH 2.040 01/07/2020: ALT 14; BUN 18; Creatinine, Ser 0.68; Hemoglobin 12.9; Platelets 238; Potassium 3.7; Sodium 138  Recent Lipid Panel    Component Value Date/Time   CHOL 164 03/08/2019 0357   CHOL 177 07/02/2018 1309   CHOL 194 11/29/2013 0504   TRIG 136 03/08/2019 0357   TRIG 61 11/29/2013 0504   HDL 36 (L) 03/08/2019 0357   HDL 51 07/02/2018 1309   HDL 43 11/29/2013 0504   CHOLHDL 4.6 03/08/2019 0357   VLDL 27 03/08/2019 0357   VLDL 12 11/29/2013 0504    LDLCALC 101 (H) 03/08/2019 0357   LDLCALC 113 (H) 07/02/2018 1309   LDLCALC 139 (H) 11/29/2013 0504    Physical Exam:    VS:  BP 118/70 (BP Location: Right Arm, Patient Position: Sitting, Cuff Size: Normal)   Pulse 67   Ht 5\' 7"  (1.702 m)   Wt 237 lb 4 oz (107.6 kg)   BMI 37.16 kg/m     Wt Readings from Last 3 Encounters:  01/08/20 237 lb 4 oz (107.6 kg)  01/07/20 233 lb (105.7 kg)  01/06/20 233 lb (105.7 kg)     GEN:  Well nourished, well developed in no acute distress HEENT: Normal NECK: No JVD; No carotid bruits LYMPHATICS: No lymphadenopathy CARDIAC: RRR, 1-2/6 systolic murmur.  RESPIRATORY:  Clear to auscultation without rales, wheezing or rhonchi  ABDOMEN: Soft, non-tender, non-distended MUSCULOSKELETAL:  No edema; No deformity  SKIN: Warm and dry NEUROLOGIC:  Alert and oriented x 3 PSYCHIATRIC:  Normal affect   ASSESSMENT:    1. Systolic murmur   2. Smoking    PLAN:    In order of problems listed above:  1. Patient with systolic murmur on cardiac exam.  Will get echocardiogram to evaluate valvular disease. 2. Smoking cessation advised.  Over 5 minutes spent counseling patient.  Follow-up in 2 months after echocardiogram  This note was generated in part or whole with voice recognition software. Voice recognition is usually quite accurate but there are transcription errors that can and very often do occur. I apologize for any typographical errors that were not detected and corrected.  Medication Adjustments/Labs and Tests Ordered: Current medicines are reviewed at length with the patient today.  Concerns regarding medicines are outlined above.  Orders Placed This Encounter  Procedures  . EKG 12-Lead  . ECHOCARDIOGRAM COMPLETE   No orders of the defined types were placed in this encounter.   Patient Instructions  Medication Instructions:  -  Your physician recommends that you continue on your current medications as directed. Please refer to the  Current Medication list given to you today.  *If you need a refill on your cardiac medications before your next appointment, please call your pharmacy*  Lab Work: - none ordered  If you have labs (blood work) drawn today and your tests are completely normal, you will receive your results only by: Marland Kitchen MyChart Message (if you have MyChart) OR . A paper copy in the mail If you have any lab test that is abnormal or we need to change your treatment, we will call you to review the results.  Testing/Procedures: - Your physician has requested that you have an echocardiogram. Echocardiography is a painless test that uses sound waves to create images of your heart. It provides your doctor with information about the size and shape of your heart and how well your heart's chambers and valves are working. This procedure takes approximately one hour. There are no restrictions for this procedure.   Follow-Up: At [facility], you and your health needs are our priority.  As part of our continuing mission to provide you with exceptional heart care, we have created designated Provider Care Teams.  These Care Teams include your primary Cardiologist (physician) and Advanced Practice Providers (APPs -  Physician Assistants and Nurse Practitioners) who all work together to provide you with the care you need, when you need it.  Your next appointment:   4-6  week(s)  The format for your next appointment:   In Person  Provider:   Kate Sable, MD  Other Instructions N/a   Echocardiogram An echocardiogram is a procedure that uses painless sound waves (ultrasound) to produce an image of the heart. Images from an echocardiogram can provide important information about:  Signs of coronary artery disease (CAD).  Aneurysm detection. An aneurysm is a weak or damaged part of an artery wall that bulges out from the normal force of blood pumping through the body.  Heart size and shape. Changes in the size or  shape of the heart can be associated with certain conditions, including heart failure, aneurysm, and CAD.  Heart muscle function.  Heart valve function.  Signs of a past heart attack.  Fluid buildup around the heart.  Thickening of the heart muscle.  A tumor or infectious growth around the heart valves. Tell a health care provider about:  Any allergies you have.  All medicines you are taking, including vitamins, herbs, eye drops, creams, and over-the-counter medicines.  Any blood disorders you have.  Any surgeries you have had.  Any medical conditions you have.  Whether you are pregnant or may be pregnant. What are the risks? Generally, this is a safe procedure. However, problems may occur, including:  Allergic reaction to dye (contrast) that may be used during the procedure. What happens before the procedure? No specific preparation is needed. You may eat and drink normally. What happens during the procedure?   An IV tube may be inserted into one of your veins.  You may receive contrast through this tube. A contrast is an injection that improves the quality of the pictures from your heart.  A gel will be applied to your chest.  A wand-like tool (transducer) will be moved over your chest. The gel will help to transmit the sound waves from the transducer.  The sound waves will harmlessly bounce off of your heart to allow the heart images to be captured in real-time motion. The images will  be recorded on a computer. The procedure may vary among health care providers and hospitals. What happens after the procedure?  You may return to your normal, everyday life, including diet, activities, and medicines, unless your health care provider tells you not to do that. Summary  An echocardiogram is a procedure that uses painless sound waves (ultrasound) to produce an image of the heart.  Images from an echocardiogram can provide important information about the size and shape  of your heart, heart muscle function, heart valve function, and fluid buildup around your heart.  You do not need to do anything to prepare before this procedure. You may eat and drink normally.  After the echocardiogram is completed, you may return to your normal, everyday life, unless your health care provider tells you not to do that. This information is not intended to replace advice given to you by your health care provider. Make sure you discuss any questions you have with your health care provider. Document Revised: 04/02/2019 Document Reviewed: 01/12/2017 Elsevier Patient Education  2020 [employer].      Signed, Debbe Odea, MD  01/08/2020 4:20 PM    [facility]

## 2020-01-08 NOTE — Patient Instructions (Signed)
Medication Instructions:  - Your physician recommends that you continue on your current medications as directed. Please refer to the Current Medication list given to you today.  *If you need a refill on your cardiac medications before your next appointment, please call your pharmacy*  Lab Work: - none ordered  If you have labs (blood work) drawn today and your tests are completely normal, you will receive your results only by: Marland Kitchen MyChart Message (if you have MyChart) OR . A paper copy in the mail If you have any lab test that is abnormal or we need to change your treatment, we will call you to review the results.  Testing/Procedures: - Your physician has requested that you have an echocardiogram. Echocardiography is a painless test that uses sound waves to create images of your heart. It provides your doctor with information about the size and shape of your heart and how well your heart's chambers and valves are working. This procedure takes approximately one hour. There are no restrictions for this procedure.   Follow-Up: At [facility], you and your health needs are our priority.  As part of our continuing mission to provide you with exceptional heart care, we have created designated Provider Care Teams.  These Care Teams include your primary Cardiologist (physician) and Advanced Practice Providers (APPs -  Physician Assistants and Nurse Practitioners) who all work together to provide you with the care you need, when you need it.  Your next appointment:   4-6  week(s)  The format for your next appointment:   In Person  Provider:   Kate Sable, MD  Other Instructions N/a   Echocardiogram An echocardiogram is a procedure that uses painless sound waves (ultrasound) to produce an image of the heart. Images from an echocardiogram can provide important information about:  Signs of coronary artery disease (CAD).  Aneurysm detection. An aneurysm is a weak or damaged part of an  artery wall that bulges out from the normal force of blood pumping through the body.  Heart size and shape. Changes in the size or shape of the heart can be associated with certain conditions, including heart failure, aneurysm, and CAD.  Heart muscle function.  Heart valve function.  Signs of a past heart attack.  Fluid buildup around the heart.  Thickening of the heart muscle.  A tumor or infectious growth around the heart valves. Tell a health care provider about:  Any allergies you have.  All medicines you are taking, including vitamins, herbs, eye drops, creams, and over-the-counter medicines.  Any blood disorders you have.  Any surgeries you have had.  Any medical conditions you have.  Whether you are pregnant or may be pregnant. What are the risks? Generally, this is a safe procedure. However, problems may occur, including:  Allergic reaction to dye (contrast) that may be used during the procedure. What happens before the procedure? No specific preparation is needed. You may eat and drink normally. What happens during the procedure?   An IV tube may be inserted into one of your veins.  You may receive contrast through this tube. A contrast is an injection that improves the quality of the pictures from your heart.  A gel will be applied to your chest.  A wand-like tool (transducer) will be moved over your chest. The gel will help to transmit the sound waves from the transducer.  The sound waves will harmlessly bounce off of your heart to allow the heart images to be captured in real-time motion.  The images will be recorded on a computer. The procedure may vary among health care providers and hospitals. What happens after the procedure?  You may return to your normal, everyday life, including diet, activities, and medicines, unless your health care provider tells you not to do that. Summary  An echocardiogram is a procedure that uses painless sound waves  (ultrasound) to produce an image of the heart.  Images from an echocardiogram can provide important information about the size and shape of your heart, heart muscle function, heart valve function, and fluid buildup around your heart.  You do not need to do anything to prepare before this procedure. You may eat and drink normally.  After the echocardiogram is completed, you may return to your normal, everyday life, unless your health care provider tells you not to do that. This information is not intended to replace advice given to you by your health care provider. Make sure you discuss any questions you have with your health care provider. Document Revised: 04/02/2019 Document Reviewed: 01/12/2017 Elsevier Patient Education  2020 [employer].

## 2020-01-11 NOTE — Telephone Encounter (Signed)
[facility] from [facility] has called asking if you are willing to prescribe this pt something for nausea, states pt went to ED last Thurs 01/07/20, pt was put on antibiotics, pt still c/o nausea. Please advise

## 2020-01-12 MED ORDER — ONDANSETRON HCL 4 MG PO TABS: 4.0000 mg | ORAL_TABLET | Freq: Three times a day (TID) | ORAL | 0 refills | Status: DC | PRN

## 2020-01-13 ENCOUNTER — Other Ambulatory Visit: Payer: Self-pay | Admitting: Radiology

## 2020-01-13 DIAGNOSIS — N201 Calculus of ureter: Secondary | ICD-10-CM

## 2020-01-14 ENCOUNTER — Other Ambulatory Visit: Payer: Self-pay

## 2020-01-14 ENCOUNTER — Encounter: Payer: Self-pay | Admitting: Physician Assistant

## 2020-01-14 ENCOUNTER — Ambulatory Visit (INDEPENDENT_AMBULATORY_CARE_PROVIDER_SITE_OTHER): Payer: Medicare Other | Admitting: Physician Assistant

## 2020-01-14 VITALS — BP 134/85 | HR 89 | Ht 67.0 in | Wt 237.0 lb

## 2020-01-14 DIAGNOSIS — N201 Calculus of ureter: Secondary | ICD-10-CM

## 2020-01-14 LAB — URINALYSIS, COMPLETE
Bilirubin, UA: NEGATIVE
Glucose, UA: NEGATIVE
Ketones, UA: NEGATIVE
Nitrite, UA: NEGATIVE
Specific Gravity, UA: 1.02 (ref 1.005–1.030)
Urobilinogen, Ur: 0.2 mg/dL (ref 0.2–1.0)
pH, UA: 6 (ref 5.0–7.5)

## 2020-01-14 LAB — MICROSCOPIC EXAMINATION

## 2020-01-14 NOTE — Progress Notes (Signed)
01/14/2020 9:15 AM   Nicole Wyatt 04/18/1966 [id number]  CC: 84mm distal left ureteral stone, LLQ pain, nausea  HPI: Nicole Wyatt is a 54 y.o. female who presents for management of known 38mm distal left ureteral stone.   She presented to the ED on 01/05/2020 with a 1-day history of LLQ pain and bladder spasms with a recent history of recurrent UTI. CT stone study revealed a 12 mm distal left ureteral calculus within an apparent ureterocele with associated severe left hydroureteronephrosis.  UA significant for pyuria and nitrites.  Admission urine culture positive for Cipro and Bactrim resistant E. coli and Enterococcus faecalis.   She subsequently underwent cystoscopy with left ureteral stent placement with Dr. Bernardo Heater on 01/05/2020. There were no complications.  Intraoperative urine culture negative.  She was treated with 1g IV ceftriaxone x1 during admission and discharged on Keflex 500 mg 4 times daily x14 days.   She presented again to the ED on 11/06/2020 with reports of nausea, chills, and decreased urinary output.  She was discharged with recommendations to continue Flomax, oxybutynin, Zofran, and NSAIDs as needed for stent pain.  In the interim, she reports feeling better. She does report some continued nausea, urinary frequency, and anorexia. She denies fever, chills, vomiting, and gross hematuria. She reports that she has taken Keflex as prescribed and is tolerating it well.  In-office UA today positive for 2+ blood, 1+ protein, and trace leukocyte esterase; urine microscopy with 6-10 WBCs/HPF and 11-20 RBCs/HPF  PMH significant for sarcoidosis, mild-moderate pulmonary hypertension per right heart cath in 2017, dCHF, and COPD on supplemental oxygen as needed. She was also recently seen by Dr. Garen Lah in cardiology; she is scheduled to undergo echocardiogram for evaluation of a systolic murmur on 07/30/5642.  PMH: Past Medical History:  Diagnosis Date  . Abnormal Pap  smear of cervix   . Anxiety   . Arthritis   . Bipolar 1 disorder ([place])   . COPD (chronic obstructive pulmonary disease) ([place])   . Depression   . Dyspnea    with exertion   . Kidney stone   . Oxygen dependent    patient uses 2L oz PRN ; reports hardly ever uses it unless very SOB    . [place] spotted fever 2018  . Sarcoidosis of lung ([place])    managed by Dr Lillette Boxer   . Seizures ([place]) last seizure 05/2013  . Sleep apnea    uses CPAP nightly   . UTI (urinary tract infection) 11/13/2018   see ED visit in epic ; was dc'd with oral abx and reports completed and relief of sx     Surgical History: Past Surgical History:  Procedure Laterality Date  . ABDOMINAL HYSTERECTOMY  1999  . BLADDER SURGERY    . CARDIAC CATHETERIZATION Bilateral 05/29/2016   Procedure: Right/Left Heart Cath and Coronary Angiography;  Surgeon: Corey Skains, MD;  Location: [facility];  Service: Cardiovascular;  Laterality: Bilateral;  . COLONOSCOPY    . COLONOSCOPY N/A 07/19/2015   Procedure: COLONOSCOPY;  Surgeon: Robert Bellow, MD;  Location: [facility] ENDOSCOPY;  Service: Endoscopy;  Laterality: N/A;  . CYSTOSCOPY WITH STENT PLACEMENT Left 01/05/2020   Procedure: CYSTOSCOPY WITH STENT PLACEMENT;  Surgeon: Abbie Sons, MD;  Location: [facility] ORS;  Service: Urology;  Laterality: Left;  . FLEXIBLE BRONCHOSCOPY N/A 07/27/2016   Procedure: FLEXIBLE BRONCHOSCOPY;  Surgeon: Allyne Gee, MD;  Location: [facility] ORS;  Service: Pulmonary;  Laterality: N/A;  . FOOT SURGERY    .  KIDNEY STONE SURGERY  7 years  . TOTAL HIP ARTHROPLASTY Left 12/02/2018   Procedure: TOTAL HIP ARTHROPLASTY ANTERIOR APPROACH;  Surgeon: Sheral Apley, MD;  Location: [facility];  Service: Orthopedics;  Laterality: Left;  . TUBAL LIGATION      Home Medications:  Allergies as of 01/14/2020      Reactions   Bee Venom Anaphylaxis   Ciprofloxacin Nausea Only   Penicillins Diarrhea, Rash   Did it involve swelling of the  face/tongue/throat, SOB, or low BP? No Did it involve sudden or severe rash/hives, skin peeling, or any reaction on the inside of your mouth or nose? No Did you need to seek medical attention at a hospital or doctor's office? No When did it last happen?4-5 years If all above answers are "NO", may proceed with cephalosporin use.      Medication List       Accurate as of January 14, 2020  9:15 AM. If you have any questions, ask your nurse or doctor.        STOP taking these medications   sulfamethoxazole-trimethoprim 800-160 MG tablet Commonly known as: BACTRIM DS Stopped by: Carman Ching, PA-C     TAKE these medications   albuterol 108 (90 Base) MCG/ACT inhaler Commonly known as: VENTOLIN HFA Inhale 2 puffs into the lungs every 4 (four) hours as needed for wheezing or shortness of breath.   aspirin EC 81 MG tablet Take 1 tablet (81 mg total) by mouth 2 (two) times daily. For DVT prophylaxis for 30 days after surgery. What changed:   when to take this  additional instructions   cephALEXin 500 MG capsule Commonly known as: KEFLEX Take 1 capsule (500 mg total) by mouth 4 (four) times daily for 14 days.   fluticasone 110 MCG/ACT inhaler Commonly known as: Flovent HFA Inhale 2 puffs 2 x day for copd. Rinse mouth afterwards What changed:   how much to take  how to take this  when to take this  reasons to take this  additional instructions   fluticasone 50 MCG/ACT nasal spray Commonly known as: FLONASE Place 2 sprays into both nostrils daily. What changed:   when to take this  reasons to take this   furosemide 40 MG tablet Commonly known as: LASIX Take 1 tablet (40 mg total) by mouth daily as needed for fluid. What changed: reasons to take this   meloxicam 15 MG tablet Commonly known as: MOBIC Take 1 tablet (15 mg total) by mouth daily.   mometasone-formoterol 100-5 MCG/ACT Aero Commonly known as: DULERA Inhale 2 puffs into the lungs  daily as needed for wheezing or shortness of breath.   neomycin-polymyxin-hydrocortisone 3.5-10000-1 ophthalmic suspension Commonly known as: CORTISPORIN Insert four drops in both ears twice daily for seven days. What changed:   how much to take  when to take this  additional instructions   ondansetron 4 MG tablet Commonly known as: ZOFRAN Take 1 tablet (4 mg total) by mouth every 8 (eight) hours as needed for nausea or vomiting.   oxybutynin 5 MG tablet Commonly known as: DITROPAN Take 1 tablet (5 mg total) by mouth 3 (three) times daily.   tamsulosin 0.4 MG Caps capsule Commonly known as: FLOMAX Take 1 capsule (0.4 mg total) by mouth daily.   triamcinolone 0.025 % cream Commonly known as: KENALOG Apply 1 application topically 2 (two) times daily. What changed: when to take this   venlafaxine XR 75 MG 24 hr capsule Commonly known as: EFFEXOR-XR Take 2  capsules (150 mg total) by mouth daily with breakfast.       Allergies:  Allergies  Allergen Reactions  . Bee Venom Anaphylaxis  . Ciprofloxacin Nausea Only  . Penicillins Diarrhea and Rash    Did it involve swelling of the face/tongue/throat, SOB, or low BP? No Did it involve sudden or severe rash/hives, skin peeling, or any reaction on the inside of your mouth or nose? No Did you need to seek medical attention at a hospital or doctor's office? No When did it last happen?4-5 years If all above answers are "NO", may proceed with cephalosporin use.      Family History: Family History  Problem Relation Age of Onset  . Hypertension Mother   . Arthritis Mother   . Heart disease Mother   . Heart failure Mother   . Hypertension Father   . Arthritis Father   . Prostate cancer Father   . Heart disease Father   . Heart attack Father   . Arrhythmia Father 84       pacemaker implant  . Ovarian cancer Maternal Grandmother     Social History:  reports that she has been smoking cigarettes. She has a 7.00  pack-year smoking history. She has never used smokeless tobacco. She reports current alcohol use. She reports that she does not use drugs.  ROS: UROLOGY Frequent Urination?: No Hard to postpone urination?: No Burning/pain with urination?: No Get up at night to urinate?: No Leakage of urine?: No Urine stream starts and stops?: No Trouble starting stream?: No Do you have to strain to urinate?: No Blood in urine?: No Urinary tract infection?: No Sexually transmitted disease?: No Injury to kidneys or bladder?: No Painful intercourse?: No Weak stream?: No Currently pregnant?: No Vaginal bleeding?: No Last menstrual period?: n  Gastrointestinal Nausea?: No Vomiting?: No Indigestion/heartburn?: No Diarrhea?: No Constipation?: No  Constitutional Fever: No Night sweats?: No Weight loss?: No Fatigue?: No  Skin Skin rash/lesions?: No Itching?: No  Eyes Blurred vision?: No Double vision?: No  Ears/Nose/Throat Sore throat?: No Sinus problems?: No  Hematologic/Lymphatic Swollen glands?: No Easy bruising?: No  Cardiovascular Leg swelling?: No Chest pain?: No  Respiratory Cough?: No Shortness of breath?: No  Endocrine Excessive thirst?: No  Musculoskeletal Back pain?: No Joint pain?: No  Neurological Headaches?: No  Psychologic Depression?: Yes Anxiety?: Yes  Physical Exam: BP 134/85   Pulse 89   Ht 5\' 7"  (1.702 m)   Wt 237 lb (107.5 kg)   BMI 37.12 kg/m   Constitutional:  Alert and oriented, No acute distress. HEENT: [place] AT, moist mucus membranes.  Trachea midline, no masses. Cardiovascular: No clubbing, cyanosis, or edema. Respiratory: Normal respiratory effort, no increased work of breathing. GI: Abdomen is soft, nontender, nondistended, no abdominal masses GU: No CVA tenderness Lymph: No cervical or inguinal lymphadenopathy. Skin: No rashes, bruises or suspicious lesions. Neurologic: Grossly intact, no focal deficits, moving all 4  extremities. Psychiatric: Normal mood and affect.  Laboratory Data: Results for orders placed or performed in visit on 01/14/20  Microscopic Examination   URINE  Result Value Ref Range   WBC, UA 6-10 (A) 0 - 5 /hpf   RBC 11-30 (A) 0 - 2 /hpf   Epithelial Cells (non renal) 0-10 0 - 10 /hpf   Bacteria, UA Few None seen/Few  Urinalysis, Complete  Result Value Ref Range   Specific Gravity, UA 1.020 1.005 - 1.030   pH, UA 6.0 5.0 - 7.5   Color, UA Yellow Yellow  Appearance Ur Cloudy (A) Clear   Leukocytes,UA Trace (A) Negative   Protein,UA 1+ (A) Negative/Trace   Glucose, UA Negative Negative   Ketones, UA Negative Negative   RBC, UA 2+ (A) Negative   Bilirubin, UA Negative Negative   Urobilinogen, Ur 0.2 0.2 - 1.0 mg/dL   Nitrite, UA Negative Negative   Microscopic Examination See below:    1. Left ureteral stone 54 year old comorbid female with recent history of recurrent UTI here to discuss definitive stone management of a 12 mm distal left ureteral stone within an apparent ureterocele, s/p urgent ureteral stent placement, on culture appropriate antibiotics.  We discussed treatment options for her stone including ureteroscopy with laser lithotripsy and stent exchange. We discussed the risks and benefits including bleeding, infection, damage to surrounding structures, efficacy with need for possible further intervention, and need for temporary ureteral stent.  Based on his conversation, she would like to proceed with URS.  I am amenable with this plan.  Preoperative UA and culture obtained today.  Given that she is currently undergoing work-up with cardiology for new murmur, will work with our surgical scheduler to try to obtain this prior to surgery. - Urinalysis, Complete - CULTURE, URINE COMPREHENSIVE  Carman Ching, PA-C  [facility] [address], [address] [place], [place] [address] [phone]

## 2020-01-15 ENCOUNTER — Other Ambulatory Visit: Payer: Medicare Other

## 2020-01-15 ENCOUNTER — Telehealth: Payer: Self-pay | Admitting: Cardiology

## 2020-01-15 NOTE — Telephone Encounter (Signed)
Pt c/o Syncope: STAT if syncope occurred within 30 minutes and pt complains of lightheadedness [place] if episode of passing out, completely, today or in last 24 hours   1. Did you pass out today?no  2. When is the last time you passed out? no  3. Has this occurred multiple times? no  4. Did you have any symptoms prior to passing out? Patient is reporting being lightheaded when standing, and also states she is having heart burn and fluttering. Patient states this has been going on for a few days. Patient has taken tums for heartburn and has had no relief  Please advise

## 2020-01-15 NOTE — Telephone Encounter (Addendum)
Spoke with the patient. She is unable to provide any VS readings and does not have a BP machine at home. Pt denies pre-syncope or syncope, chest pain, sob, palpitations.  Patient sts that the dizziness is associated with position change, when she goes from sitting to standing. She also reports a burning sedation in the center of her chest that she refers to as heart burn.  The discomfort is not associated with meals and is intermittent. Her appetite has been poor since staring new medications prescribe by urology.  Patient sts that she also has a intermittent  fluttering in her chest that she does not associate with any other symptoms.  She was recently started on antibiotics and flomax for treatment of a ureteral stone. Advised the pt that flomax can cause low bp and dizziness.  Also the antibiotics could cause some GI upset.  Advised the patient in the interim that she should change position slowly. Advised her to limit her use of Tums due to hx and present ureteral stones. Advised her to contact her Urologist to update them on her current symptoms.  Advised the patient that I will fwd the message to Dr. Azucena Cecil to get his recommendations.  Patient verbalized understanding and voiced appreciation for the call back.

## 2020-01-18 LAB — CULTURE, URINE COMPREHENSIVE

## 2020-01-18 NOTE — Telephone Encounter (Signed)
Patient doing better this morning. She verbalized understanding of Dr. Merita Norton recommendations.

## 2020-01-18 NOTE — Telephone Encounter (Signed)
Debbe Odea, MD  You; Jarvis Newcomer, RN 1 hour ago (8:16 AM)   Patient symptoms of dizziness with change in position sounds like positional vertigo. She should follow-up with her primary care physician for management. For heartburn, she can try over-the-counter Prilosec/omeprazole 1 tab daily. Also she should follow-up with her primary care physician for these, especially if symptoms do not resolve.   Routing comment

## 2020-01-19 ENCOUNTER — Telehealth: Payer: Self-pay | Admitting: Cardiology

## 2020-01-19 ENCOUNTER — Encounter: Payer: Self-pay | Admitting: Urology

## 2020-01-19 ENCOUNTER — Other Ambulatory Visit: Payer: Self-pay

## 2020-01-19 ENCOUNTER — Encounter
Admission: RE | Admit: 2020-01-19 | Discharge: 2020-01-19 | Disposition: A | Discharge status: Home / Self Care | Payer: Medicare Other | Source: Ambulatory Visit | Attending: Urology | Admitting: Urology

## 2020-01-19 DIAGNOSIS — N201 Calculus of ureter: Secondary | ICD-10-CM | POA: Diagnosis not present

## 2020-01-19 DIAGNOSIS — Z01811 Encounter for preprocedural respiratory examination: Secondary | ICD-10-CM | POA: Insufficient documentation

## 2020-01-19 NOTE — Patient Instructions (Signed)
Your procedure is scheduled on: Tues 2/2 Report to Day Surgery. To find out your arrival time please call [phone] between 1PM - 3PM on Mon 2/1.  Remember: Instructions that are not followed completely may result in serious medical risk,  up to and including death, or upon the discretion of your surgeon and anesthesiologist your  surgery may need to be rescheduled.     _X__ 1. Do not eat food after midnight the night before your procedure.                 No gum chewing or hard candies. You may drink clear liquids up to 2 hours                 before you are scheduled to arrive for your surgery- DO not drink clear                 liquids within 2 hours of the start of your surgery.                 Clear Liquids include:  water, apple juice without pulp, clear carbohydrate                 drink such as Clearfast of Gatorade, Black Coffee or Tea (Do not add                 anything to coffee or tea).  __X__2.  On the morning of surgery brush your teeth with toothpaste and water, you                may rinse your mouth with mouthwash if you wish.  Do not swallow any toothpaste of mouthwash.     _X__ 3.  No Alcohol for 24 hours before or after surgery.   _X__ 4.  Do Not Smoke or use e-cigarettes For 24 Hours Prior to Your Surgery.                 Do not use any chewable tobacco products for at least 6 hours prior to                 surgery.  ____  5.  Bring all medications with you on the day of surgery if instructed.   __x_  6.  Notify your doctor if there is any change in your medical condition      (cold, fever, infections).     Do not wear jewelry, make-up, hairpins, clips or nail polish. Do not wear lotions, powders, or perfumes. You may wear deodorant. Do not shave 48 hours prior to surgery. Men may shave face and neck. Do not bring valuables to the hospital.    [facility] is not responsible for any belongings or valuables.  Contacts, dentures or bridgework may not be  worn into surgery. Leave your suitcase in the car. After surgery it may be brought to your room. For patients admitted to the hospital, discharge time is determined by your treatment team.   Patients discharged the day of surgery will not be allowed to drive home.   Please read over the following fact sheets that you were given:     ___x_ Take these medicines the morning of surgery with A SIP OF WATER:    1. oxybutynin (DITROPAN) 5 MG tablet  2. tamsulosin (FLOMAX) 0.4 MG CAPS capsule  3. venlafaxine XR (EFFEXOR-XR) 75 MG 24 hr capsule  4.fluticasone (FLONASE) 50 MCG/ACT nasal spray if needed  5.  6.  ____ Fleet Enema (as directed)   ____ Shower the night before and the morning of surgery.  Clean sheets on bed.  __x__ Use inhalers on the day of surgeryalbuterol (PROVENTIL HFA;VENTOLIN HFA) 108 (90 Base) MCG/ACT inhaler and bring it with you fluticasone (FLOVENT HFA) 110 MCG/ACT inhaler,mometasone-formoterol (DULERA) 100-5 MCG/ACT AERO ____ Stop metformin 2 days prior to surgery    ____ Take 1/2 of usual insulin dose the night before surgery. No insulin the morning          of surgery.   _x___ Stopped aspirin yesterday  _x___ Stop Anti-inflammatories meloxicam (MOBIC) 15 MG tablet today   May take tylenol   ____ Stop supplements until after surgery.    ____ Bring C-Pap to the hospital.

## 2020-01-19 NOTE — Patient Instructions (Signed)
Your procedure is scheduled on: Tues 2/2 Report to Day Surgery. To find out your arrival time please call [phone] between 1PM - 3PM on Mon 2/1.  Remember: Instructions that are not followed completely may result in serious medical risk,  up to and including death, or upon the discretion of your surgeon and anesthesiologist your  surgery may need to be rescheduled.     _X__ 1. Do not eat food after midnight the night before your procedure.                 No gum chewing or hard candies. You may drink clear liquids up to 2 hours                 before you are scheduled to arrive for your surgery- DO not drink clear                 liquids within 2 hours of the start of your surgery.                 Clear Liquids include:  water, apple juice without pulp, clear carbohydrate                 drink such as Clearfast of Gatorade, Black Coffee or Tea (Do not add                 anything to coffee or tea).  __X__2.  On the morning of surgery brush your teeth with toothpaste and water, you                may rinse your mouth with mouthwash if you wish.  Do not swallow any toothpaste of mouthwash.     _X__ 3.  No Alcohol for 24 hours before or after surgery.   _X__ 4.  Do Not Smoke or use e-cigarettes For 24 Hours Prior to Your Surgery.                 Do not use any chewable tobacco products for at least 6 hours prior to                 surgery.  ____  5.  Bring all medications with you on the day of surgery if instructed.   __x_  6.  Notify your doctor if there is any change in your medical condition      (cold, fever, infections).     Do not wear jewelry, make-up, hairpins, clips or nail polish. Do not wear lotions, powders, or perfumes. You may wear deodorant. Do not shave 48 hours prior to surgery. Men may shave face and neck. Do not bring valuables to the hospital.    [facility] is not responsible for any belongings or valuables.  Contacts, dentures or bridgework may not be  worn into surgery. Leave your suitcase in the car. After surgery it may be brought to your room. For patients admitted to the hospital, discharge time is determined by your treatment team.   Patients discharged the day of surgery will not be allowed to drive home.   Please read over the following fact sheets that you were given:     ___x_ Take these medicines the morning of surgery with A SIP OF WATER:    1. oxybutynin (DITROPAN) 5 MG tablet  2. tamsulosin (FLOMAX) 0.4 MG CAPS capsule  3. venlafaxine XR (EFFEXOR-XR) 75 MG 24 hr capsule  4.fluticasone (FLONASE) 50 MCG/ACT nasal spray if needed  5.    6.  ____ Fleet Enema (as directed)   ____ Shower the night before and the morning of surgery.  Clean sheets on bed.  __x__ Use inhalers on the day of surgeryalbuterol (PROVENTIL HFA;VENTOLIN HFA) 108 (90 Base) MCG/ACT inhaler and bring it with you fluticasone (FLOVENT HFA) 110 MCG/ACT inhaler,mometasone-formoterol (DULERA) 100-5 MCG/ACT AERO ____ Stop metformin 2 days prior to surgery    ____ Take 1/2 of usual insulin dose the night before surgery. No insulin the morning          of surgery.   _x___ Stopped aspirin yesterday  _x___ Stop Anti-inflammatories meloxicam (MOBIC) 15 MG tablet today   May take tylenol   ____ Stop supplements until after surgery.    ____ Bring C-Pap to the hospital.                

## 2020-01-19 NOTE — Telephone Encounter (Signed)
   [facility] Pre-operative Risk Assessment    Request for surgical clearance:  1. What type of surgery is being performed? Cystoscopy, ureteroscopy, laser lithotripsy, stone removal, ureteral stent exchange  2. When is this surgery scheduled? 01/19/20  3. What type of clearance is required (medical clearance vs. Pharmacy clearance to hold med vs. Both)? Medical for (unstable angina, systolic murmur and pulmonary hypertension)  4. Are there any medications that need to be held prior to surgery and how long? Not noted  5. Practice name and name of physician performing surgery? [facility] Dr. Bernardo Heater  6. What is your office phone number [phone]   7.   What is your office fax number [phone]  8.   Anesthesia type (None, local, MAC, general) ? Not noted   Marykay Lex 01/19/2020, 2:56 PM  _________________________________________________________________   (provider comments below)

## 2020-01-20 ENCOUNTER — Other Ambulatory Visit (HOSPITAL_COMMUNITY): Payer: Medicare Other

## 2020-01-20 NOTE — Telephone Encounter (Signed)
Pt s/w our scheduler Sabrina G. in [place] and was able to explain to the pt what we are trying to do to move her appts up so that we don't delay her surgery. Pt is able to come in 01/21/20 Thursday for echo and will see Eula Listen, [facility] 01/22/20 Friday for pre op clearance and review of echo. I have s/w Amy at [facility] and have made her aware we were able to move appts up sooner for pre op clearance. I will forward clearance note to Eula Listen, [facility] for appt 01/22/20 Friday. I will remove from the pre op call back pool.

## 2020-01-20 NOTE — Telephone Encounter (Signed)
Pt's VM is full, could not lmom. I attempted to call her daughter # as well though VM not set up. I then called DPR Gerrit Heck and asked if she would please ask the pt to call the cardiologist office ASAP in regards to needing to move her appts sooner for her pre op clearance. Gerrit Heck has offered to help try and reach the pt and will have her call the office ASAP. I will let the echo dept know we have added this pt on for pre op clearance. We will need to move appt with Dr. Sandie Ano in [place] to Friday at 4 pm. Pt surgery is set for 01/26/20. I am hoping to be able to move these appts up so that surgery is not delayed for the pt.

## 2020-01-20 NOTE — Telephone Encounter (Signed)
Will review at time of her office visit and fax recommendations to Urology afterwards.

## 2020-01-20 NOTE — Telephone Encounter (Signed)
I s/w surgeon's office who stated the pt's surgery has been moved out to 01/26/20. I explained to the surgeon's office the pt is set up for an echo 2/9 and the cardiologist 2/12 for review of echo and clearance to be assessed at that time. Surgeon's office is asking if we can move up the echo and the appt with MD.   I have sent a message to our [place] office to see if they can reach out to the pt and help reschedule appts.   I assured the surgeon's office that I will call back if we are not able to move pt's appts up sooner.

## 2020-01-20 NOTE — Telephone Encounter (Signed)
   Primary Cardiologist: Debbe Odea, MD  Chart reviewed as part of pre-operative protocol coverage.   Covering staff, please ask requesting provider if clearance still needed or not. Surgery date was yesterday.   If patient needs clearance, will review after echo 2/9 during follow up visit 2/12.   [place], [place] 01/20/2020, 11:25 AM

## 2020-01-21 ENCOUNTER — Other Ambulatory Visit: Payer: Self-pay

## 2020-01-21 ENCOUNTER — Encounter: Payer: Self-pay | Admitting: Physician Assistant

## 2020-01-21 ENCOUNTER — Ambulatory Visit: Payer: Medicare Other | Admitting: Urology

## 2020-01-21 ENCOUNTER — Ambulatory Visit
Admission: RE | Admit: 2020-01-21 | Discharge: 2020-01-21 | Disposition: A | Discharge status: Home / Self Care | Payer: Medicare Other | Source: Ambulatory Visit | Attending: Urology | Admitting: Urology

## 2020-01-21 ENCOUNTER — Encounter: Payer: Self-pay | Admitting: Urology

## 2020-01-21 ENCOUNTER — Ambulatory Visit (INDEPENDENT_AMBULATORY_CARE_PROVIDER_SITE_OTHER): Payer: Medicare Other

## 2020-01-21 ENCOUNTER — Other Ambulatory Visit: Payer: Self-pay | Admitting: Urology

## 2020-01-21 VITALS — BP 137/84 | HR 98 | Ht 67.0 in | Wt 237.0 lb

## 2020-01-21 DIAGNOSIS — R011 Cardiac murmur, unspecified: Secondary | ICD-10-CM

## 2020-01-21 DIAGNOSIS — N201 Calculus of ureter: Secondary | ICD-10-CM | POA: Insufficient documentation

## 2020-01-21 MED ORDER — HYDROCODONE-ACETAMINOPHEN 5-325 MG PO TABS: 1.0000 | ORAL_TABLET | Freq: Four times a day (QID) | ORAL | 0 refills | Status: DC | PRN

## 2020-01-21 MED ORDER — SULFAMETHOXAZOLE-TRIMETHOPRIM 800-160 MG PO TABS: 1.0000 | ORAL_TABLET | Freq: Two times a day (BID) | ORAL | 0 refills | Status: DC

## 2020-01-21 NOTE — Progress Notes (Signed)
Cardiology Office Note    Date:  01/22/2020   ID:  Nicole Wyatt, DOB [birth year], MRN [id number]  PCP:  [facility]  Cardiologist:  Debbe OdeaBrian Agbor-Etang, MD  Electrophysiologist:  None   Chief Complaint: Preprocedure cardiac evaluation  History of Present Illness:   Nicole Wyatt is a 54 y.o. female with history of normal coronary arteries by LHC in 2017, chronic hypoxic respiratory failure secondary to sarcoidosis, COPD, pulmonary hypertension, obesity, sleep apnea, bipolar disorder, ongoing tobacco use, and ureteral stones who presents for preprocedure cardiac evaluation.  She was previously followed by Dr[facility] Gwen [facility]Kowalski [facility], though has more recently established with Dr. Melody HaverAgbor-Etnag with [facility]  Patient underwent R/LHC in 05/2016, by [facility], which showed normal coronary arteries with mild pulmonary hypertension.  More recently, she was admitted to the hospital in 02/2019 with recurrent seizure disorder versus pseudoseizure and hypoglycemia.  Echo at that time showed an EF of 60 to 65%, normal LV diastolic function, no regional wall motion abnormalities, normal RVSF and cavity size, mildly dilated left atrium, trivial mitral regurgitation, trileaflet aortic valve with mild calcification and mild stenosis, normal size and structure aortic root and ascending aorta, PASP 40 mmHg.  She was evaluated by Dr. Azucena CecilAgbor-Etang on 01/08/2020 at the request of Lorenda CahillAnna Crawford, NP for murmur.  She was going through significant stress at that time after losing both her parents within the past year.  She denied any chest pain or dyspnea.  Echo was performed on 01/21/2020 to evaluate murmur which showed an EF of 60 to 65%, mild LVH, grade 1 diastolic dysfunction, no regional wall motion analysis of the LV, normal RVSF and cavity size, mild mitral regurgitation, mild tricuspid regurgitation, no evidence of aortic stenosis, unable to estimate PASP.  She is  scheduled to undergo cystoscopy/ureteroscopy, laser lithotripsy, and ureteral stent exchange on 01/26/2020.  She comes in doing well from a cardiac perspective.  She denies any chest pain, shortness of breath, dizziness, presyncope, syncope.  No lower extremity swelling, abdominal distention, PND, or early satiety.  She has stable two-pillow orthopnea.  No falls.  She is able to achieve greater than 4 METs without cardiac limitation.  She indicates she takes her as needed furosemide approximately once per week.  Weight has been stable.  She does request a refill on her inhalers today.  Otherwise, she does not have any issues or concerns.   Labs independently reviewed: 12/2019 - Hgb 12.9, PLT 238, potassium 3.7, BUN 18, serum creatinine 0.68, albumin 4.0, AST/ALT normal 06/2019 - TSH normal, A1c 5.1 02/2019 - TC 164, TG 136, HDL 36, LDL 101  Past Medical History:  Diagnosis Date  . Abnormal Pap smear of cervix   . Anxiety   . Arthritis   . Bipolar 1 disorder (HCC)   . COPD (chronic obstructive pulmonary disease) (HCC)   . Depression   . Dyspnea    with exertion   . History of kidney stones   . Oxygen dependent    patient uses 2L oz PRN ; reports hardly ever uses it unless very SOB    . Rocky [place] spotted fever 2018  . Sarcoidosis of lung (HCC)    managed by Dr Lindie SpruceSaddat   . Seizures (HCC) last seizure 05/2013  . Sleep apnea    uses CPAP nightly   . UTI (urinary tract infection) 11/13/2018   see ED visit in epic ; was dc'd with oral abx and reports completed and relief of  sx     Past Surgical History:  Procedure Laterality Date  . ABDOMINAL HYSTERECTOMY  1999  . BLADDER SURGERY    . CARDIAC CATHETERIZATION Bilateral 05/29/2016   Procedure: Right/Left Heart Cath and Coronary Angiography;  Surgeon: Corey Skains, MD;  Location: [place] [facility];  Service: Cardiovascular;  Laterality: Bilateral;  . COLONOSCOPY    . COLONOSCOPY N/A 07/19/2015   Procedure: COLONOSCOPY;  Surgeon:  Robert Bellow, MD;  Location: [facility] ENDOSCOPY;  Service: Endoscopy;  Laterality: N/A;  . CYSTOSCOPY WITH STENT PLACEMENT Left 01/05/2020   Procedure: CYSTOSCOPY WITH STENT PLACEMENT;  Surgeon: Abbie Sons, MD;  Location: [facility] ORS;  Service: Urology;  Laterality: Left;  . FLEXIBLE BRONCHOSCOPY N/A 07/27/2016   Procedure: FLEXIBLE BRONCHOSCOPY;  Surgeon: Allyne Gee, MD;  Location: [facility] ORS;  Service: Pulmonary;  Laterality: N/A;  . FOOT SURGERY    . JOINT REPLACEMENT Left    hip  . KIDNEY STONE SURGERY  7 years  . TOTAL HIP ARTHROPLASTY Left 12/02/2018   Procedure: TOTAL HIP ARTHROPLASTY ANTERIOR APPROACH;  Surgeon: Renette Butters, MD;  Location: [facility] ORS;  Service: Orthopedics;  Laterality: Left;  . TUBAL LIGATION      Current Medications: Current Meds  Medication Sig  . albuterol (PROVENTIL HFA;VENTOLIN HFA) 108 (90 Base) MCG/ACT inhaler Inhale 2 puffs into the lungs every 4 (four) hours as needed for wheezing or shortness of breath.  Marland Kitchen aspirin EC 81 MG tablet Take 1 tablet (81 mg total) by mouth 2 (two) times daily. For DVT prophylaxis for 30 days after surgery. (Patient taking differently: Take 81 mg by mouth daily. )  . cephALEXin (KEFLEX) 500 MG capsule Take 500 mg by mouth 4 (four) times daily.  . fluticasone (FLONASE) 50 MCG/ACT nasal spray Place 2 sprays into both nostrils daily. (Patient taking differently: Place 2 sprays into both nostrils daily as needed for allergies. )  . fluticasone (FLOVENT HFA) 110 MCG/ACT inhaler Inhale 2 puffs 2 x day for copd. Rinse mouth afterwards (Patient taking differently: Inhale 2 puffs into the lungs 2 (two) times daily as needed (shortness of breath). )  . furosemide (LASIX) 40 MG tablet Take 1 tablet (40 mg total) by mouth daily as needed for fluid. (Patient taking differently: Take 40 mg by mouth daily as needed for fluid or edema. )  . HYDROcodone-acetaminophen (NORCO/VICODIN) 5-325 MG tablet Take 1 tablet by mouth every 6 (six) hours as  needed for moderate pain.  . meloxicam (MOBIC) 15 MG tablet Take 1 tablet (15 mg total) by mouth daily.  . mometasone-formoterol (DULERA) 100-5 MCG/ACT AERO Inhale 2 puffs into the lungs daily as needed for wheezing or shortness of breath.  . neomycin-polymyxin-hydrocortisone (CORTISPORIN) 3.5-10000-1 ophthalmic suspension Insert four drops in both ears twice daily for seven days. (Patient taking differently: 4 drops See admin instructions. Insert four drops in both ears twice daily for seven days as needed for ear pain)  . ondansetron (ZOFRAN) 4 MG tablet Take 1 tablet (4 mg total) by mouth every 8 (eight) hours as needed for nausea or vomiting.  Marland Kitchen oxybutynin (DITROPAN) 5 MG tablet Take 1 tablet (5 mg total) by mouth 3 (three) times daily.  . tamsulosin (FLOMAX) 0.4 MG CAPS capsule Take 1 capsule (0.4 mg total) by mouth daily.  Marland Kitchen triamcinolone (KENALOG) 0.025 % cream Apply 1 application topically 2 (two) times daily. (Patient taking differently: Apply 1 application topically 3 (three) times daily. )  . venlafaxine XR (EFFEXOR-XR) 75 MG 24  hr capsule Take 2 capsules (150 mg total) by mouth daily with breakfast.    Allergies:   Bee venom, Ciprofloxacin, and Penicillins   Social History   Socioeconomic History  . Marital status: Single    Spouse name: Not on file  . Number of children: Not on file  . Years of education: Not on file  . Highest education level: Not on file  Occupational History  . Not on file  Tobacco Use  . Smoking status: Current Every Day Smoker    Packs/day: 0.50    Years: 14.00    Pack years: 7.00    Types: Cigarettes    Last attempt to quit: 05/07/2016    Years since quitting: 3.7  . Smokeless tobacco: Never Used  . Tobacco comment: 11-25-18 reports  down to just 1 cigarette per day   Substance and Sexual Activity  . Alcohol use: Yes    Alcohol/week: 0.0 standard drinks    Comment: seldom   . Drug use: No  . Sexual activity: Never  Other Topics Concern  . Not  on file  Social History Narrative  . Not on file   Social Determinants of Health   Financial Resource Strain:   . Difficulty of Paying Living Expenses: Not on file  Food Insecurity:   . Worried About Programme researcher, broadcasting/film/video in the Last Year: Not on file  . Ran Out of Food in the Last Year: Not on file  Transportation Needs:   . Lack of Transportation (Medical): Not on file  . Lack of Transportation (Non-Medical): Not on file  Physical Activity:   . Days of Exercise per Week: Not on file  . Minutes of Exercise per Session: Not on file  Stress:   . Feeling of Stress : Not on file  Social Connections:   . Frequency of Communication with Friends and Family: Not on file  . Frequency of Social Gatherings with Friends and Family: Not on file  . Attends Religious Services: Not on file  . Active Member of Clubs or Organizations: Not on file  . Attends Banker Meetings: Not on file  . Marital Status: Not on file     Family History:  The patient's family history includes Arrhythmia (age of onset: 81) in her father; Arthritis in her father and mother; Heart attack in her father; Heart disease in her father and mother; Heart failure in her mother; Hypertension in her father and mother; Ovarian cancer in her maternal grandmother; Prostate cancer in her father.  ROS:   Review of Systems  Constitutional: Negative for chills, diaphoresis, fever, malaise/fatigue and weight loss.  HENT: Negative for congestion.   Eyes: Negative for discharge and redness.  Respiratory: Negative for cough, hemoptysis, sputum production, shortness of breath and wheezing.   Cardiovascular: Negative for chest pain, palpitations, orthopnea, claudication, leg swelling and PND.  Gastrointestinal: Negative for abdominal pain, blood in stool, heartburn, melena, nausea and vomiting.  Musculoskeletal: Negative for falls and myalgias.  Skin: Negative for rash.  Neurological: Negative for dizziness, tingling,  tremors, sensory change, speech change, focal weakness, loss of consciousness and weakness.  Endo/Heme/Allergies: Does not bruise/bleed easily.  Psychiatric/Behavioral: Negative for substance abuse. The patient is not nervous/anxious.   All other systems reviewed and are negative.    EKGs/Labs/Other Studies Reviewed:    Studies reviewed were summarized above. The additional studies were reviewed today:  2D echo 01/21/2020: 1. Left ventricular ejection fraction, by visual estimation, is 60 to  65%.  The left ventricle has normal function. There is mildly increased  left ventricular hypertrophy.  2. Left ventricular diastolic parameters are consistent with Grade I  diastolic dysfunction (impaired relaxation).  3. The left ventricle has no regional wall motion abnormalities.  4. Global right ventricle has normal systolic function.The right  ventricular size is normal. No increase in right ventricular wall  thickness.  5. Left atrial size was normal.  6. TR signal is inadequate for assessing pulmonary artery systolic  pressure.  7. Aortic valve mean gradient measures 7.0 mmHg.  8. Aortic valve area, by VTI measures 3.16 cm.  __________  2D echo 02/2019: 1. The left ventricle has normal systolic function with an ejection fraction of 60-65%. The cavity size was normal. Left ventricular diastolic parameters were normal. No evidence of left ventricular regional wall motion abnormalities.  2. The right ventricle has normal systolic function. The cavity was normal. There is no increase in right ventricular wall thickness.  3. Left atrial size was mildly dilated.  4. No evidence of mitral valve stenosis. Trivial mitral regurgitation.  5. The aortic valve is tricuspid Mild calcification of the aortic valve. mild stenosis of the aortic valve. Mean gradient 14 mmHg, AVA 1.8 cm^2.  6. The aortic root and ascending aorta are normal in size and structure.  7. Normal IVC size. PA systolic  pressure 40 mmHg. __________  [facility] 05/2016 (performed by outside group): Assessment  The patient has had canadian class 4 angina with low risk stress test and risk factors which include high blood pressure and high cholesterol. Left ventricular function is normal with ejection fraction of 60%. Normal coronary arteries with 0% stenosis Mild pulmonary hypertension and mild elevation of LVEDP Plan No further cardiac intervention at this time. Continue medical management and risk factor modification Further investigation of recent symptoms   EKG:  EKG is ordered today.  The EKG ordered today demonstrates NSR, 73 bpm, bifascicular block with LAFB and RBBB, unchanged from prior study  Recent Labs: 07/20/2019: TSH 2.040 01/07/2020: ALT 14; BUN 18; Creatinine, Ser 0.68; Hemoglobin 12.9; Platelets 238; Potassium 3.7; Sodium 138  Recent Lipid Panel    Component Value Date/Time   CHOL 164 03/08/2019 0357   CHOL 177 07/02/2018 1309   CHOL 194 11/29/2013 0504   TRIG 136 03/08/2019 0357   TRIG 61 11/29/2013 0504   HDL 36 (L) 03/08/2019 0357   HDL 51 07/02/2018 1309   HDL 43 11/29/2013 0504   CHOLHDL 4.6 03/08/2019 0357   VLDL 27 03/08/2019 0357   VLDL 12 11/29/2013 0504   LDLCALC 101 (H) 03/08/2019 0357   LDLCALC 113 (H) 07/02/2018 1309   LDLCALC 139 (H) 11/29/2013 0504    PHYSICAL EXAM:    VS:  BP 120/84 (BP Location: Right Arm, Patient Position: Sitting, Cuff Size: Normal)   Pulse 73   Ht 5\' 7"  (1.702 m)   Wt 236 lb 4 oz (107.2 kg)   SpO2 99%   BMI 37.00 kg/m   BMI: Body mass index is 37 kg/m.  Physical Exam  Constitutional: She is oriented to person, place, and time. She appears well-developed and well-nourished.  HENT:  Head: Normocephalic and atraumatic.  Eyes: Right eye exhibits no discharge. Left eye exhibits no discharge.  Neck: No JVD present.  Cardiovascular: Normal rate, regular rhythm, S1 normal and S2 normal. Exam reveals no distant heart sounds, no friction rub,  no midsystolic click and no opening snap.  Murmur heard. High-pitched blowing holosystolic murmur is present with  a grade of 1/6 at the apex. Pulses:      Posterior tibial pulses are 2+ on the right side and 2+ on the left side.  Pulmonary/Chest: Effort normal and breath sounds normal. No respiratory distress. She has no decreased breath sounds. She has no wheezes. She has no rales. She exhibits no tenderness.  Abdominal: Soft. She exhibits no distension. There is no abdominal tenderness.  Musculoskeletal:        General: No edema.     Cervical back: Normal range of motion.  Neurological: She is alert and oriented to person, place, and time.  Skin: Skin is warm and dry. No cyanosis. Nails show no clubbing.  Psychiatric: She has a normal mood and affect. Her speech is normal and behavior is normal. Judgment and thought content normal.    Wt Readings from Last 3 Encounters:  01/22/20 236 lb 4 oz (107.2 kg)  01/21/20 237 lb (107.5 kg)  01/14/20 237 lb (107.5 kg)     ASSESSMENT & PLAN:   1. Preoperative cardiac risk stratification: She is doing well from a cardiac perspective and is able to achieve greater than 4 METs without cardiac limitation.  Per Revised Cardiac Index, she is overall low risk with an estimated rate of 0.9% for adverse cardiac event for noncardiac procedure and does not require any further cardiac testing at this time.  She may proceed at an overall low risk.  Patient's aspirin use is deferred to PCP as she is on this for noncardiac etiology.  2. Murmur/mitral regurgitation: Asymptomatic.  Previously noted to have mild aortic stenosis in 02/2019 with no evidence of this on most recent echo from earlier this week.  Follow-up echo in 12 months.  3. Pulmonary hypertension: Stable.  Likely in setting of underlying COPD.  Continue as needed Lasix.  4. COPD: Refill inhalers x1 given upcoming procedure.  Further refills must come from PCP.  Disposition: F/u with Dr. Azucena Cecil  or an APP in 12 months, sooner if needed.   Medication Adjustments/Labs and Tests Ordered: Current medicines are reviewed at length with the patient today.  Concerns regarding medicines are outlined above. Medication changes, Labs and Tests ordered today are summarized above and listed in the Patient Instructions accessible in Encounters.   Signed, Eula Listen, PA-C 01/22/2020 10:31 AM     [facility] [place] [address] [place], [place] [address] [phone]

## 2020-01-21 NOTE — H&P (View-Only) (Signed)
  01/21/2020 4:44 PM   Nicole Wyatt 07/25/1966 [id number]  Referring provider: [facility] [address] [place],  [place] [address]  Chief Complaint  Patient presents with  . Nephrolithiasis    HPI: Nicole Wyatt is a 54 year old female with a left ureteral stent due a 12 mm distal left ureteral stone who presents today fearful that she has dislodged her stent.    The stent was placed on 01/05/2020 with Dr. Stoioff and she is schedule for URS/LL/ureteral stent exchange on 02/02/021.    She states she started experiencing intense bladder spasms yesterday.  She is taking oxybutynin IR 5 mg TID and tamsulosin 0.4 mg daily.   Patient denies any modifying or aggravating factors.  Patient denies any gross hematuria, dysuria or suprapubic/flank pain.  Patient denies any fevers, chills, nausea or vomiting.  UA orange cloudy, 3+ blood, 2+ protein, nitrite positive, 1+ leukocyte and 6-10 WBCs, 11-30 RBCs few bacteria.  Patient denies taking any Pyridium or other over-the-counter medications for urinary pain.    KUB 01/21/2020 Status post left ureteral stent placement. The previously seen UVJ stone is been displaced into the distal ureter.    PMH: Past Medical History:  Diagnosis Date  . Abnormal Pap smear of cervix   . Anxiety   . Arthritis   . Bipolar 1 disorder (HCC)   . COPD (chronic obstructive pulmonary disease) (HCC)   . Depression   . Dyspnea    with exertion   . History of kidney stones   . Oxygen dependent    patient uses 2L oz PRN ; reports hardly ever uses it unless very SOB    . [place] spotted fever 2018  . Sarcoidosis of lung (HCC)    managed by Dr Saddat   . Seizures (HCC) last seizure 05/2013  . Sleep apnea    uses CPAP nightly   . UTI (urinary tract infection) 11/13/2018   see ED visit in epic ; was dc'd with oral abx and reports completed and relief of sx     Surgical History: Past Surgical History:  Procedure Laterality Date    . ABDOMINAL HYSTERECTOMY  1999  . BLADDER SURGERY    . CARDIAC CATHETERIZATION Bilateral 05/29/2016   Procedure: Right/Left Heart Cath and Coronary Angiography;  Surgeon: Bruce J Kowalski, MD;  Location: [facility];  Service: Cardiovascular;  Laterality: Bilateral;  . COLONOSCOPY    . COLONOSCOPY N/A 07/19/2015   Procedure: COLONOSCOPY;  Surgeon: Jeffrey W Byrnett, MD;  Location: [facility] ENDOSCOPY;  Service: Endoscopy;  Laterality: N/A;  . CYSTOSCOPY WITH STENT PLACEMENT Left 01/05/2020   Procedure: CYSTOSCOPY WITH STENT PLACEMENT;  Surgeon: Stoioff, Scott C, MD;  Location: [facility] ORS;  Service: Urology;  Laterality: Left;  . FLEXIBLE BRONCHOSCOPY N/A 07/27/2016   Procedure: FLEXIBLE BRONCHOSCOPY;  Surgeon: Saadat A Khan, MD;  Location: [facility] ORS;  Service: Pulmonary;  Laterality: N/A;  . FOOT SURGERY    . JOINT REPLACEMENT Left    hip  . KIDNEY STONE SURGERY  7 years  . TOTAL HIP ARTHROPLASTY Left 12/02/2018   Procedure: TOTAL HIP ARTHROPLASTY ANTERIOR APPROACH;  Surgeon: Murphy, Timothy D, MD;  Location: [facility];  Service: Orthopedics;  Laterality: Left;  . TUBAL LIGATION      Home Medications:  Allergies as of 01/21/2020      Reactions   Bee Venom Anaphylaxis   Ciprofloxacin Nausea Only   Penicillins Diarrhea, Rash   Did it involve swelling of   the face/tongue/throat, SOB, or low BP? No Did it involve sudden or severe rash/hives, skin peeling, or any reaction on the inside of your mouth or nose? No Did you need to seek medical attention at a hospital or doctor's office? No When did it last happen?4-5 years If all above answers are "NO", may proceed with cephalosporin use.      Medication List       Accurate as of January 21, 2020  4:44 PM. If you have any questions, ask your nurse or doctor.        albuterol 108 (90 Base) MCG/ACT inhaler Commonly known as: VENTOLIN HFA Inhale 2 puffs into the lungs every 4 (four) hours as needed for wheezing or shortness of breath.    aspirin EC 81 MG tablet Take 1 tablet (81 mg total) by mouth 2 (two) times daily. For DVT prophylaxis for 30 days after surgery. What changed:   when to take this  additional instructions   fluticasone 110 MCG/ACT inhaler Commonly known as: Flovent HFA Inhale 2 puffs 2 x day for copd. Rinse mouth afterwards What changed:   how much to take  how to take this  when to take this  reasons to take this  additional instructions   fluticasone 50 MCG/ACT nasal spray Commonly known as: FLONASE Place 2 sprays into both nostrils daily. What changed:   when to take this  reasons to take this   furosemide 40 MG tablet Commonly known as: LASIX Take 1 tablet (40 mg total) by mouth daily as needed for fluid. What changed: reasons to take this   HYDROcodone-acetaminophen 5-325 MG tablet Commonly known as: NORCO/VICODIN Take 1 tablet by mouth every 6 (six) hours as needed for moderate pain. Started by: Wilmina Maxham, PA-C   meloxicam 15 MG tablet Commonly known as: MOBIC Take 1 tablet (15 mg total) by mouth daily.   mometasone-formoterol 100-5 MCG/ACT Aero Commonly known as: DULERA Inhale 2 puffs into the lungs daily as needed for wheezing or shortness of breath.   neomycin-polymyxin-hydrocortisone 3.5-10000-1 ophthalmic suspension Commonly known as: CORTISPORIN Insert four drops in both ears twice daily for seven days. What changed:   how much to take  when to take this  additional instructions   ondansetron 4 MG tablet Commonly known as: ZOFRAN Take 1 tablet (4 mg total) by mouth every 8 (eight) hours as needed for nausea or vomiting.   oxybutynin 5 MG tablet Commonly known as: DITROPAN Take 1 tablet (5 mg total) by mouth 3 (three) times daily.   sulfamethoxazole-trimethoprim 800-160 MG tablet Commonly known as: BACTRIM DS Take 1 tablet by mouth every 12 (twelve) hours. Started by: Selwyn Reason, PA-C   tamsulosin 0.4 MG Caps capsule Commonly known as:  FLOMAX Take 1 capsule (0.4 mg total) by mouth daily.   triamcinolone 0.025 % cream Commonly known as: KENALOG Apply 1 application topically 2 (two) times daily. What changed: when to take this   venlafaxine XR 75 MG 24 hr capsule Commonly known as: EFFEXOR-XR Take 2 capsules (150 mg total) by mouth daily with breakfast.       Allergies:  Allergies  Allergen Reactions  . Bee Venom Anaphylaxis  . Ciprofloxacin Nausea Only  . Penicillins Diarrhea and Rash    Did it involve swelling of the face/tongue/throat, SOB, or low BP? No Did it involve sudden or severe rash/hives, skin peeling, or any reaction on the inside of your mouth or nose? No Did you need to seek medical attention at   a hospital or doctor's office? No When did it last happen?4-5 years If all above answers are "NO", may proceed with cephalosporin use.      Family History: Family History  Problem Relation Age of Onset  . Hypertension Mother   . Arthritis Mother   . Heart disease Mother   . Heart failure Mother   . Hypertension Father   . Arthritis Father   . Prostate cancer Father   . Heart disease Father   . Heart attack Father   . Arrhythmia Father 68       pacemaker implant  . Ovarian cancer Maternal Grandmother     Social History:  reports that she has been smoking cigarettes. She has a 7.00 pack-year smoking history. She has never used smokeless tobacco. She reports current alcohol use. She reports that she does not use drugs.  ROS: UROLOGY Frequent Urination?: No Hard to postpone urination?: No Burning/pain with urination?: No Get up at night to urinate?: No Leakage of urine?: No Urine stream starts and stops?: No Trouble starting stream?: No Do you have to strain to urinate?: No Blood in urine?: No Urinary tract infection?: No Sexually transmitted disease?: No Injury to kidneys or bladder?: No Painful intercourse?: No Weak stream?: No Currently pregnant?: No Vaginal bleeding?:  No Last menstrual period?: n  Gastrointestinal Nausea?: Yes Vomiting?: No Indigestion/heartburn?: No Diarrhea?: No Constipation?: No  Constitutional Fever: No Night sweats?: No Weight loss?: No Fatigue?: No  Skin Skin rash/lesions?: No Itching?: No  Eyes Blurred vision?: No Double vision?: No  Ears/Nose/Throat Sore throat?: No Sinus problems?: No  Hematologic/Lymphatic Swollen glands?: No Easy bruising?: No  Cardiovascular Leg swelling?: No Chest pain?: No  Respiratory Cough?: No Shortness of breath?: No  Endocrine Excessive thirst?: No  Musculoskeletal Back pain?: No Joint pain?: No  Neurological Headaches?: No Dizziness?: No  Psychologic Depression?: Yes Anxiety?: Yes  Physical Exam: BP 137/84   Pulse 98   Ht 5' 7" (1.702 m)   Wt 237 lb (107.5 kg)   BMI 37.12 kg/m   Constitutional:  Well nourished. Alert and oriented, No acute distress. HEENT: [place] AT, mask in place.  Trachea midline, no masses. Cardiovascular: No clubbing, cyanosis, or edema. Respiratory: Normal respiratory effort, no increased work of breathing. Neurologic: Grossly intact, no focal deficits, moving all 4 extremities. Psychiatric: Normal mood and affect.  Laboratory Data: Lab Results  Component Value Date   WBC 7.2 01/07/2020   HGB 12.9 01/07/2020   HCT 39.6 01/07/2020   MCV 92.7 01/07/2020   PLT 238 01/07/2020    Lab Results  Component Value Date   CREATININE 0.68 01/07/2020    No results found for: PSA  No results found for: TESTOSTERONE  Lab Results  Component Value Date   HGBA1C 5.1 07/20/2019    Lab Results  Component Value Date   TSH 2.040 07/20/2019       Component Value Date/Time   CHOL 164 03/08/2019 0357   CHOL 177 07/02/2018 1309   CHOL 194 11/29/2013 0504   HDL 36 (L) 03/08/2019 0357   HDL 51 07/02/2018 1309   HDL 43 11/29/2013 0504   CHOLHDL 4.6 03/08/2019 0357   VLDL 27 03/08/2019 0357   VLDL 12 11/29/2013 0504   LDLCALC 101  (H) 03/08/2019 0357   LDLCALC 113 (H) 07/02/2018 1309   LDLCALC 139 (H) 11/29/2013 0504    Lab Results  Component Value Date   AST 16 01/07/2020   Lab Results  Component Value Date     ALT 14 01/07/2020   No components found for: ALKALINEPHOPHATASE No components found for: BILIRUBINTOTAL  No results found for: ESTRADIOL  Urinalysis Component     Latest Ref Rng & Units 01/21/2020  Specific Gravity, UA     1.005 - 1.030 >1.030 (H)  pH, UA     5.0 - 7.5 6.0  Color, UA     Yellow Orange  Appearance Ur     Clear Cloudy (A)  Leukocytes,UA     Negative 1+ (A)  Protein,UA     Negative/Trace 2+ (A)  Glucose, UA     Negative Negative  Ketones, UA     Negative Negative  RBC, UA     Negative 3+ (A)  Bilirubin, UA     Negative Negative  Urobilinogen, Ur     0.2 - 1.0 mg/dL 1.0  Nitrite, UA     Negative Positive (A)  Microscopic Examination      See below:   Component     Latest Ref Rng & Units 01/21/2020  WBC, UA     0 - 5 /hpf 6-10 (A)  RBC     0 - 2 /hpf 11-30 (A)  Epithelial Cells (non renal)     0 - 10 /hpf 0-10  Casts     None seen /lpf Present (A)  Cast Type     N/A Hyaline casts  Bacteria, UA     None seen/Few Few   I have reviewed the labs.   Pertinent Imaging: CLINICAL DATA:  Follow-up left ureteral stone  EXAM: ABDOMEN - 1 VIEW  COMPARISON:  01/05/2020  FINDINGS: Scattered large and small bowel gas is noted. No abnormal mass is seen. Previously seen left ureteral stone has been displaced into the distal ureter near the pelvic inlet. The left ureteral stent is seen in satisfactory position. Left hip replacement is noted. No acute bony abnormality is seen.  IMPRESSION: Status post left ureteral stent placement. The previously seen UVJ stone is been displaced into the distal ureter.   Electronically Signed   By: Mark  Lukens M.D.   On: 01/21/2020 16:13  I have independently reviewed the films and stent is in good position.      Assessment & Plan:    1. Left ureteral stone Patient is status post left ureteral stent placement awaiting ureteroscopy with laser lithotripsy and stent exchange on January 26, 2020 KUB confirms stent is in proper position UA is nitrate positive and will start Septra DS, twice daily and urine is sent for culture Patient is given refill on her Vicodin 5/325, #7, 1 tablet as needed every 6 hours  Return for return for left URS/LL/ureteral stent exchange .  These notes generated with voice recognition software. I apologize for typographical errors.  Sundus Pete, PA-C  [facility] [address]  [address] [place], [place] [address] [phone]  

## 2020-01-21 NOTE — Progress Notes (Signed)
01/21/2020 4:44 PM   Nicole Wyatt [birth year] [id number]  Referring provider: [facility], [facility] [address] [place],  [place] [address]  Chief Complaint  Patient presents with  . Nephrolithiasis    HPI: Nicole Wyatt is a 54 year old female with a left ureteral stent due a 12 mm distal left ureteral stone who presents today fearful that she has dislodged her stent.    The stent was placed on 01/05/2020 with Dr. Lonna Cobb and she is schedule for URS/LL/ureteral stent exchange on 02/02/021.    She states she started experiencing intense bladder spasms yesterday.  She is taking oxybutynin IR 5 mg TID and tamsulosin 0.4 mg daily.   Patient denies any modifying or aggravating factors.  Patient denies any gross hematuria, dysuria or suprapubic/flank pain.  Patient denies any fevers, chills, nausea or vomiting.  UA orange cloudy, 3+ blood, 2+ protein, nitrite positive, 1+ leukocyte and 6-10 WBCs, 11-30 RBCs few bacteria.  Patient denies taking any Pyridium or other over-the-counter medications for urinary pain.    KUB 01/21/2020 Status post left ureteral stent placement. The previously seen UVJ stone is been displaced into the distal ureter.    PMH: Past Medical History:  Diagnosis Date  . Abnormal Pap smear of cervix   . Anxiety   . Arthritis   . Bipolar 1 disorder (HCC)   . COPD (chronic obstructive pulmonary disease) (HCC)   . Depression   . Dyspnea    with exertion   . History of kidney stones   . Oxygen dependent    patient uses 2L oz PRN ; reports hardly ever uses it unless very SOB    . [place] spotted fever 2018  . Sarcoidosis of lung (HCC)    managed by Dr Lindie Spruce   . Seizures (HCC) last seizure 05/2013  . Sleep apnea    uses CPAP nightly   . UTI (urinary tract infection) 11/13/2018   see ED visit in epic ; was dc'd with oral abx and reports completed and relief of sx     Surgical History: Past Surgical History:  Procedure Laterality Date    . ABDOMINAL HYSTERECTOMY  1999  . BLADDER SURGERY    . CARDIAC CATHETERIZATION Bilateral 05/29/2016   Procedure: Right/Left Heart Cath and Coronary Angiography;  Surgeon: Lamar Blinks, MD;  Location: [facility] INVASIVE CV LAB;  Service: Cardiovascular;  Laterality: Bilateral;  . COLONOSCOPY    . COLONOSCOPY N/A 07/19/2015   Procedure: COLONOSCOPY;  Surgeon: Earline Mayotte, MD;  Location: [facility] ENDOSCOPY;  Service: Endoscopy;  Laterality: N/A;  . CYSTOSCOPY WITH STENT PLACEMENT Left 01/05/2020   Procedure: CYSTOSCOPY WITH STENT PLACEMENT;  Surgeon: Riki Altes, MD;  Location: [facility] ORS;  Service: Urology;  Laterality: Left;  . FLEXIBLE BRONCHOSCOPY N/A 07/27/2016   Procedure: FLEXIBLE BRONCHOSCOPY;  Surgeon: Yevonne Pax, MD;  Location: [facility] ORS;  Service: Pulmonary;  Laterality: N/A;  . FOOT SURGERY    . JOINT REPLACEMENT Left    hip  . KIDNEY STONE SURGERY  7 years  . TOTAL HIP ARTHROPLASTY Left 12/02/2018   Procedure: TOTAL HIP ARTHROPLASTY ANTERIOR APPROACH;  Surgeon: Sheral Apley, MD;  Location: [facility] ORS;  Service: Orthopedics;  Laterality: Left;  . TUBAL LIGATION      Home Medications:  Allergies as of 01/21/2020      Reactions   Bee Venom Anaphylaxis   Ciprofloxacin Nausea Only   Penicillins Diarrhea, Rash   Did it involve swelling of  the face/tongue/throat, SOB, or low BP? No Did it involve sudden or severe rash/hives, skin peeling, or any reaction on the inside of your mouth or nose? No Did you need to seek medical attention at a hospital or doctor's office? No When did it last happen?4-5 years If all above answers are "NO", may proceed with cephalosporin use.      Medication List       Accurate as of January 21, 2020  4:44 PM. If you have any questions, ask your nurse or doctor.        albuterol 108 (90 Base) MCG/ACT inhaler Commonly known as: VENTOLIN HFA Inhale 2 puffs into the lungs every 4 (four) hours as needed for wheezing or shortness of breath.    aspirin EC 81 MG tablet Take 1 tablet (81 mg total) by mouth 2 (two) times daily. For DVT prophylaxis for 30 days after surgery. What changed:   when to take this  additional instructions   fluticasone 110 MCG/ACT inhaler Commonly known as: Flovent HFA Inhale 2 puffs 2 x day for copd. Rinse mouth afterwards What changed:   how much to take  how to take this  when to take this  reasons to take this  additional instructions   fluticasone 50 MCG/ACT nasal spray Commonly known as: FLONASE Place 2 sprays into both nostrils daily. What changed:   when to take this  reasons to take this   furosemide 40 MG tablet Commonly known as: LASIX Take 1 tablet (40 mg total) by mouth daily as needed for fluid. What changed: reasons to take this   HYDROcodone-acetaminophen 5-325 MG tablet Commonly known as: NORCO/VICODIN Take 1 tablet by mouth every 6 (six) hours as needed for moderate pain. Started by: Zara Council, PA-C   meloxicam 15 MG tablet Commonly known as: MOBIC Take 1 tablet (15 mg total) by mouth daily.   mometasone-formoterol 100-5 MCG/ACT Aero Commonly known as: DULERA Inhale 2 puffs into the lungs daily as needed for wheezing or shortness of breath.   neomycin-polymyxin-hydrocortisone 3.5-10000-1 ophthalmic suspension Commonly known as: CORTISPORIN Insert four drops in both ears twice daily for seven days. What changed:   how much to take  when to take this  additional instructions   ondansetron 4 MG tablet Commonly known as: ZOFRAN Take 1 tablet (4 mg total) by mouth every 8 (eight) hours as needed for nausea or vomiting.   oxybutynin 5 MG tablet Commonly known as: DITROPAN Take 1 tablet (5 mg total) by mouth 3 (three) times daily.   sulfamethoxazole-trimethoprim 800-160 MG tablet Commonly known as: BACTRIM DS Take 1 tablet by mouth every 12 (twelve) hours. Started by: Zara Council, PA-C   tamsulosin 0.4 MG Caps capsule Commonly known as:  FLOMAX Take 1 capsule (0.4 mg total) by mouth daily.   triamcinolone 0.025 % cream Commonly known as: KENALOG Apply 1 application topically 2 (two) times daily. What changed: when to take this   venlafaxine XR 75 MG 24 hr capsule Commonly known as: EFFEXOR-XR Take 2 capsules (150 mg total) by mouth daily with breakfast.       Allergies:  Allergies  Allergen Reactions  . Bee Venom Anaphylaxis  . Ciprofloxacin Nausea Only  . Penicillins Diarrhea and Rash    Did it involve swelling of the face/tongue/throat, SOB, or low BP? No Did it involve sudden or severe rash/hives, skin peeling, or any reaction on the inside of your mouth or nose? No Did you need to seek medical attention at  a hospital or doctor's office? No When did it last happen?4-5 years If all above answers are "NO", may proceed with cephalosporin use.      Family History: Family History  Problem Relation Age of Onset  . Hypertension Mother   . Arthritis Mother   . Heart disease Mother   . Heart failure Mother   . Hypertension Father   . Arthritis Father   . Prostate cancer Father   . Heart disease Father   . Heart attack Father   . Arrhythmia Father 35       pacemaker implant  . Ovarian cancer Maternal Grandmother     Social History:  reports that she has been smoking cigarettes. She has a 7.00 pack-year smoking history. She has never used smokeless tobacco. She reports current alcohol use. She reports that she does not use drugs.  ROS: UROLOGY Frequent Urination?: No Hard to postpone urination?: No Burning/pain with urination?: No Get up at night to urinate?: No Leakage of urine?: No Urine stream starts and stops?: No Trouble starting stream?: No Do you have to strain to urinate?: No Blood in urine?: No Urinary tract infection?: No Sexually transmitted disease?: No Injury to kidneys or bladder?: No Painful intercourse?: No Weak stream?: No Currently pregnant?: No Vaginal bleeding?:  No Last menstrual period?: n  Gastrointestinal Nausea?: Yes Vomiting?: No Indigestion/heartburn?: No Diarrhea?: No Constipation?: No  Constitutional Fever: No Night sweats?: No Weight loss?: No Fatigue?: No  Skin Skin rash/lesions?: No Itching?: No  Eyes Blurred vision?: No Double vision?: No  Ears/Nose/Throat Sore throat?: No Sinus problems?: No  Hematologic/Lymphatic Swollen glands?: No Easy bruising?: No  Cardiovascular Leg swelling?: No Chest pain?: No  Respiratory Cough?: No Shortness of breath?: No  Endocrine Excessive thirst?: No  Musculoskeletal Back pain?: No Joint pain?: No  Neurological Headaches?: No Dizziness?: No  Psychologic Depression?: Yes Anxiety?: Yes  Physical Exam: BP 137/84   Pulse 98   Ht 5\' 7"  (1.702 m)   Wt 237 lb (107.5 kg)   BMI 37.12 kg/m   Constitutional:  Well nourished. Alert and oriented, No acute distress. HEENT: [place] AT, mask in place.  Trachea midline, no masses. Cardiovascular: No clubbing, cyanosis, or edema. Respiratory: Normal respiratory effort, no increased work of breathing. Neurologic: Grossly intact, no focal deficits, moving all 4 extremities. Psychiatric: Normal mood and affect.  Laboratory Data: Lab Results  Component Value Date   WBC 7.2 01/07/2020   HGB 12.9 01/07/2020   HCT 39.6 01/07/2020   MCV 92.7 01/07/2020   PLT 238 01/07/2020    Lab Results  Component Value Date   CREATININE 0.68 01/07/2020    No results found for: PSA  No results found for: TESTOSTERONE  Lab Results  Component Value Date   HGBA1C 5.1 07/20/2019    Lab Results  Component Value Date   TSH 2.040 07/20/2019       Component Value Date/Time   CHOL 164 03/08/2019 0357   CHOL 177 07/02/2018 1309   CHOL 194 11/29/2013 0504   HDL 36 (L) 03/08/2019 0357   HDL 51 07/02/2018 1309   HDL 43 11/29/2013 0504   CHOLHDL 4.6 03/08/2019 0357   VLDL 27 03/08/2019 0357   VLDL 12 11/29/2013 0504   LDLCALC 101  (H) 03/08/2019 0357   LDLCALC 113 (H) 07/02/2018 1309   LDLCALC 139 (H) 11/29/2013 0504    Lab Results  Component Value Date   AST 16 01/07/2020   Lab Results  Component Value Date  ALT 14 01/07/2020   No components found for: ALKALINEPHOPHATASE No components found for: BILIRUBINTOTAL  No results found for: ESTRADIOL  Urinalysis Component     Latest Ref Rng & Units 01/21/2020  Specific Gravity, UA     1.005 - 1.030 >1.030 (H)  pH, UA     5.0 - 7.5 6.0  Color, UA     Yellow Orange  Appearance Ur     Clear Cloudy (A)  Leukocytes,UA     Negative 1+ (A)  Protein,UA     Negative/Trace 2+ (A)  Glucose, UA     Negative Negative  Ketones, UA     Negative Negative  RBC, UA     Negative 3+ (A)  Bilirubin, UA     Negative Negative  Urobilinogen, Ur     0.2 - 1.0 mg/dL 1.0  Nitrite, UA     Negative Positive (A)  Microscopic Examination      See below:   Component     Latest Ref Rng & Units 01/21/2020  WBC, UA     0 - 5 /hpf 6-10 (A)  RBC     0 - 2 /hpf 11-30 (A)  Epithelial Cells (non renal)     0 - 10 /hpf 0-10  Casts     None seen /lpf Present (A)  Cast Type     N/A Hyaline casts  Bacteria, UA     None seen/Few Few   I have reviewed the labs.   Pertinent Imaging: CLINICAL DATA:  Follow-up left ureteral stone  EXAM: ABDOMEN - 1 VIEW  COMPARISON:  01/05/2020  FINDINGS: Scattered large and small bowel gas is noted. No abnormal mass is seen. Previously seen left ureteral stone has been displaced into the distal ureter near the pelvic inlet. The left ureteral stent is seen in satisfactory position. Left hip replacement is noted. No acute bony abnormality is seen.  IMPRESSION: Status post left ureteral stent placement. The previously seen UVJ stone is been displaced into the distal ureter.   Electronically Signed   By: Alcide Clever M.D.   On: 01/21/2020 16:13  I have independently reviewed the films and stent is in good position.      Assessment & Plan:    1. Left ureteral stone Patient is status post left ureteral stent placement awaiting ureteroscopy with laser lithotripsy and stent exchange on January 26, 2020 KUB confirms stent is in proper position UA is nitrate positive and will start Septra DS, twice daily and urine is sent for culture Patient is given refill on her Vicodin 5/325, #7, 1 tablet as needed every 6 hours  Return for return for left URS/LL/ureteral stent exchange .  These notes generated with voice recognition software. I apologize for typographical errors.  Michiel Cowboy, PA-C  [facility] [address]  [address] [place], [place] [address] [phone]

## 2020-01-22 ENCOUNTER — Other Ambulatory Visit
Admission: RE | Admit: 2020-01-22 | Discharge: 2020-01-22 | Disposition: A | Discharge status: Home / Self Care | Payer: Medicare Other | Source: Ambulatory Visit | Attending: Urology | Admitting: Urology

## 2020-01-22 ENCOUNTER — Encounter: Payer: Self-pay | Admitting: Physician Assistant

## 2020-01-22 ENCOUNTER — Ambulatory Visit (INDEPENDENT_AMBULATORY_CARE_PROVIDER_SITE_OTHER): Payer: Medicare Other | Admitting: Physician Assistant

## 2020-01-22 VITALS — BP 120/84 | HR 73 | Ht 67.0 in | Wt 236.2 lb

## 2020-01-22 DIAGNOSIS — Z01812 Encounter for preprocedural laboratory examination: Secondary | ICD-10-CM | POA: Diagnosis not present

## 2020-01-22 DIAGNOSIS — J4520 Mild intermittent asthma, uncomplicated: Secondary | ICD-10-CM

## 2020-01-22 DIAGNOSIS — Z01810 Encounter for preprocedural cardiovascular examination: Secondary | ICD-10-CM

## 2020-01-22 DIAGNOSIS — J449 Chronic obstructive pulmonary disease, unspecified: Secondary | ICD-10-CM

## 2020-01-22 DIAGNOSIS — I272 Pulmonary hypertension, unspecified: Secondary | ICD-10-CM

## 2020-01-22 DIAGNOSIS — R011 Cardiac murmur, unspecified: Secondary | ICD-10-CM

## 2020-01-22 DIAGNOSIS — Z20822 Contact with and (suspected) exposure to covid-19: Secondary | ICD-10-CM | POA: Diagnosis not present

## 2020-01-22 DIAGNOSIS — I340 Nonrheumatic mitral (valve) insufficiency: Secondary | ICD-10-CM | POA: Diagnosis not present

## 2020-01-22 DIAGNOSIS — J301 Allergic rhinitis due to pollen: Secondary | ICD-10-CM

## 2020-01-22 LAB — MICROSCOPIC EXAMINATION

## 2020-01-22 LAB — SARS CORONAVIRUS 2 (TAT 6-24 HRS): SARS Coronavirus 2: NEGATIVE

## 2020-01-22 LAB — URINALYSIS, COMPLETE
Bilirubin, UA: NEGATIVE
Glucose, UA: NEGATIVE
Ketones, UA: NEGATIVE
Nitrite, UA: POSITIVE — AB
Specific Gravity, UA: >1.03 — ABNORMAL HIGH (ref 1.005–1.030)
Urobilinogen, Ur: 1 mg/dL (ref 0.2–1.0)
pH, UA: 6 (ref 5.0–7.5)

## 2020-01-22 MED ORDER — ALBUTEROL SULFATE HFA 108 (90 BASE) MCG/ACT IN AERS: 2.0000 | INHALATION_SPRAY | RESPIRATORY_TRACT | 3 refills | Status: DC | PRN

## 2020-01-22 MED ORDER — FLOVENT HFA 110 MCG/ACT IN AERO: INHALATION_SPRAY | RESPIRATORY_TRACT | 3 refills | Status: DC

## 2020-01-22 NOTE — Patient Instructions (Signed)
Medication Instructions:  Your physician recommends that you continue on your current medications as directed. Please refer to the Current Medication list given to you today.  *If you need a refill on your cardiac medications before your next appointment, please call your pharmacy*  Lab Work: None ordered  If you have labs (blood work) drawn today and your tests are completely normal, you will receive your results only by: Marland Kitchen MyChart Message (if you have MyChart) OR . A paper copy in the mail If you have any lab test that is abnormal or we need to change your treatment, we will call you to review the results.  Testing/Procedures: 1- Echo  Please return to [facility] on ________(12 months)______ at _______________ AM/PM for an Echocardiogram. Your physician has requested that you have an echocardiogram. Echocardiography is a painless test that uses sound waves to create images of your heart. It provides your doctor with information about the size and shape of your heart and how well your heart's chambers and valves are working. This procedure takes approximately one hour. There are no restrictions for this procedure. Please note; depending on visual quality an IV may need to be placed.    Follow-Up: At [facility], you and your health needs are our priority.  As part of our continuing mission to provide you with exceptional heart care, we have created designated Provider Care Teams.  These Care Teams include your primary Cardiologist (physician) and Advanced Practice Providers (APPs -  Physician Assistants and Nurse Practitioners) who all work together to provide you with the care you need, when you need it.  Your next appointment:   12 month(s)  The format for your next appointment:   In Person  Provider:    You may see Debbe Odea, MD or Eula Listen, PA-C.

## 2020-01-24 LAB — CULTURE, URINE COMPREHENSIVE

## 2020-01-25 ENCOUNTER — Telehealth: Payer: Self-pay | Admitting: Family Medicine

## 2020-01-25 MED ORDER — CIPROFLOXACIN HCL 500 MG PO TABS: 500.0000 mg | ORAL_TABLET | Freq: Two times a day (BID) | ORAL | 0 refills | Status: DC

## 2020-01-25 NOTE — Telephone Encounter (Signed)
-----   Message from Harle Battiest, PA-C sent at 01/25/2020  8:02 AM EST ----- Please have Mrs. Carte start Cipro 500 mg, BID x 7 days today and discontinue her Septra DS.

## 2020-01-25 NOTE — Telephone Encounter (Signed)
Patient notified. Cipro has been sent to pharmacy.

## 2020-01-25 NOTE — Telephone Encounter (Signed)
-----   Message from Shannon A McGowan, PA-C sent at 01/25/2020  8:02 AM EST ----- Please have Nicole Wyatt start Cipro 500 mg, BID x 7 days today and discontinue her Septra DS.  

## 2020-01-26 ENCOUNTER — Other Ambulatory Visit: Payer: Self-pay

## 2020-01-26 ENCOUNTER — Ambulatory Visit: Payer: Medicare Other | Admitting: Anesthesiology

## 2020-01-26 ENCOUNTER — Ambulatory Visit: Payer: Medicare Other

## 2020-01-26 ENCOUNTER — Encounter: Payer: Self-pay | Admitting: Urology

## 2020-01-26 ENCOUNTER — Encounter
Admission: RE | Disposition: A | Discharge status: Home / Self Care | Payer: Self-pay | Source: Home / Self Care | Attending: Urology

## 2020-01-26 ENCOUNTER — Ambulatory Visit
Admission: RE | Admit: 2020-01-26 | Discharge: 2020-01-26 | Disposition: A | Discharge status: Home / Self Care | Payer: Medicare Other | Attending: Urology | Admitting: Urology

## 2020-01-26 ENCOUNTER — Ambulatory Visit: Payer: Self-pay | Admitting: Urology

## 2020-01-26 DIAGNOSIS — Z79899 Other long term (current) drug therapy: Secondary | ICD-10-CM | POA: Diagnosis not present

## 2020-01-26 DIAGNOSIS — Z880 Allergy status to penicillin: Secondary | ICD-10-CM | POA: Insufficient documentation

## 2020-01-26 DIAGNOSIS — Z7982 Long term (current) use of aspirin: Secondary | ICD-10-CM | POA: Diagnosis not present

## 2020-01-26 DIAGNOSIS — Z881 Allergy status to other antibiotic agents status: Secondary | ICD-10-CM | POA: Diagnosis not present

## 2020-01-26 DIAGNOSIS — R569 Unspecified convulsions: Secondary | ICD-10-CM | POA: Diagnosis not present

## 2020-01-26 DIAGNOSIS — M1710 Unilateral primary osteoarthritis, unspecified knee: Secondary | ICD-10-CM | POA: Diagnosis not present

## 2020-01-26 DIAGNOSIS — F17210 Nicotine dependence, cigarettes, uncomplicated: Secondary | ICD-10-CM | POA: Diagnosis not present

## 2020-01-26 DIAGNOSIS — Z96642 Presence of left artificial hip joint: Secondary | ICD-10-CM | POA: Insufficient documentation

## 2020-01-26 DIAGNOSIS — D860 Sarcoidosis of lung: Secondary | ICD-10-CM | POA: Diagnosis not present

## 2020-01-26 DIAGNOSIS — Z9981 Dependence on supplemental oxygen: Secondary | ICD-10-CM | POA: Diagnosis not present

## 2020-01-26 DIAGNOSIS — Z8261 Family history of arthritis: Secondary | ICD-10-CM | POA: Diagnosis not present

## 2020-01-26 DIAGNOSIS — G4733 Obstructive sleep apnea (adult) (pediatric): Secondary | ICD-10-CM | POA: Insufficient documentation

## 2020-01-26 DIAGNOSIS — M1610 Unilateral primary osteoarthritis, unspecified hip: Secondary | ICD-10-CM | POA: Insufficient documentation

## 2020-01-26 DIAGNOSIS — Z791 Long term (current) use of non-steroidal anti-inflammatories (NSAID): Secondary | ICD-10-CM | POA: Insufficient documentation

## 2020-01-26 DIAGNOSIS — J449 Chronic obstructive pulmonary disease, unspecified: Secondary | ICD-10-CM | POA: Diagnosis not present

## 2020-01-26 DIAGNOSIS — F319 Bipolar disorder, unspecified: Secondary | ICD-10-CM | POA: Insufficient documentation

## 2020-01-26 DIAGNOSIS — N201 Calculus of ureter: Secondary | ICD-10-CM | POA: Diagnosis not present

## 2020-01-26 DIAGNOSIS — Z466 Encounter for fitting and adjustment of urinary device: Secondary | ICD-10-CM | POA: Diagnosis not present

## 2020-01-26 HISTORY — DX: Personal history of urinary calculi: Z87.442

## 2020-01-26 HISTORY — PX: CYSTOSCOPY/URETEROSCOPY/HOLMIUM LASER/STENT PLACEMENT: SHX6546

## 2020-01-26 SURGERY — CYSTOSCOPY/URETEROSCOPY/HOLMIUM LASER/STENT PLACEMENT
Anesthesia: General | Laterality: Left

## 2020-01-26 MED ORDER — PROPOFOL 10 MG/ML IV BOLUS
INTRAVENOUS | Status: AC
  Filled 2020-01-26: qty 20

## 2020-01-26 MED ORDER — MIDAZOLAM HCL 2 MG/2ML IJ SOLN
INTRAMUSCULAR | Status: DC | PRN
  Administered 2020-01-26: 2 mg via INTRAVENOUS

## 2020-01-26 MED ORDER — IOHEXOL 180 MG/ML  SOLN
INTRAMUSCULAR | Status: DC | PRN
  Administered 2020-01-26: 20 mL

## 2020-01-26 MED ORDER — CEFAZOLIN SODIUM-DEXTROSE 2-4 GM/100ML-% IV SOLN
INTRAVENOUS | Status: AC
  Filled 2020-01-26: qty 100

## 2020-01-26 MED ORDER — FENTANYL CITRATE (PF) 100 MCG/2ML IJ SOLN
INTRAMUSCULAR | Status: AC
  Filled 2020-01-26: qty 2

## 2020-01-26 MED ORDER — ROCURONIUM BROMIDE 100 MG/10ML IV SOLN
INTRAVENOUS | Status: DC | PRN
  Administered 2020-01-26: 10 mg via INTRAVENOUS
  Administered 2020-01-26 (×2): 20 mg via INTRAVENOUS

## 2020-01-26 MED ORDER — FENTANYL CITRATE (PF) 100 MCG/2ML IJ SOLN
25.0000 ug | INTRAMUSCULAR | Status: DC | PRN
  Administered 2020-01-26: 25 ug via INTRAVENOUS

## 2020-01-26 MED ORDER — CEFAZOLIN SODIUM-DEXTROSE 2-4 GM/100ML-% IV SOLN
2.0000 g | INTRAVENOUS | Status: AC
  Administered 2020-01-26: 08:00:00 2 g via INTRAVENOUS

## 2020-01-26 MED ORDER — FAMOTIDINE 20 MG PO TABS
ORAL_TABLET | ORAL | Status: AC
  Administered 2020-01-26: 20 mg via ORAL
  Filled 2020-01-26: qty 1

## 2020-01-26 MED ORDER — PROPOFOL 10 MG/ML IV BOLUS
INTRAVENOUS | Status: DC | PRN
  Administered 2020-01-26: 200 mg via INTRAVENOUS

## 2020-01-26 MED ORDER — MIDAZOLAM HCL 2 MG/2ML IJ SOLN
INTRAMUSCULAR | Status: AC
  Filled 2020-01-26: qty 2

## 2020-01-26 MED ORDER — SUCCINYLCHOLINE CHLORIDE 20 MG/ML IJ SOLN
INTRAMUSCULAR | Status: DC | PRN
  Administered 2020-01-26: 120 mg via INTRAVENOUS

## 2020-01-26 MED ORDER — FENTANYL CITRATE (PF) 100 MCG/2ML IJ SOLN
INTRAMUSCULAR | Status: DC | PRN
  Administered 2020-01-26: 25 ug via INTRAVENOUS
  Administered 2020-01-26: 50 ug via INTRAVENOUS
  Administered 2020-01-26: 25 ug via INTRAVENOUS

## 2020-01-26 MED ORDER — ONDANSETRON HCL 4 MG/2ML IJ SOLN: 4.0000 mg | Freq: Once | INTRAMUSCULAR | Status: DC | PRN

## 2020-01-26 MED ORDER — FENTANYL CITRATE (PF) 100 MCG/2ML IJ SOLN
INTRAMUSCULAR | Status: AC
  Administered 2020-01-26: 10:00:00 25 ug via INTRAVENOUS
  Filled 2020-01-26: qty 2

## 2020-01-26 MED ORDER — FAMOTIDINE 20 MG PO TABS: 20.0000 mg | ORAL_TABLET | Freq: Once | ORAL | Status: AC

## 2020-01-26 MED ORDER — DEXAMETHASONE SODIUM PHOSPHATE 10 MG/ML IJ SOLN
INTRAMUSCULAR | Status: DC | PRN
  Administered 2020-01-26: 10 mg via INTRAVENOUS

## 2020-01-26 MED ORDER — LIDOCAINE HCL (CARDIAC) PF 100 MG/5ML IV SOSY
PREFILLED_SYRINGE | INTRAVENOUS | Status: DC | PRN
  Administered 2020-01-26: 100 mg via INTRAVENOUS

## 2020-01-26 MED ORDER — ESMOLOL HCL 100 MG/10ML IV SOLN
INTRAVENOUS | Status: DC | PRN
  Administered 2020-01-26: 10 ug via INTRAVENOUS
  Administered 2020-01-26: 30 ug via INTRAVENOUS
  Administered 2020-01-26: 10 ug via INTRAVENOUS

## 2020-01-26 MED ORDER — LACTATED RINGERS IV SOLN
INTRAVENOUS | Status: DC
  Administered 2020-01-26: 07:00:00 50 mL/h via INTRAVENOUS

## 2020-01-26 MED ORDER — ONDANSETRON HCL 4 MG/2ML IJ SOLN
INTRAMUSCULAR | Status: DC | PRN
  Administered 2020-01-26: 4 mg via INTRAVENOUS

## 2020-01-26 MED ORDER — GLYCOPYRROLATE 0.2 MG/ML IJ SOLN
INTRAMUSCULAR | Status: DC | PRN
  Administered 2020-01-26: .2 mg via INTRAVENOUS

## 2020-01-26 MED ORDER — DEXMEDETOMIDINE HCL IN NACL 200 MCG/50ML IV SOLN
INTRAVENOUS | Status: DC | PRN
  Administered 2020-01-26: 8 ug via INTRAVENOUS

## 2020-01-26 MED ORDER — SUGAMMADEX SODIUM 500 MG/5ML IV SOLN
INTRAVENOUS | Status: DC | PRN
  Administered 2020-01-26: 500 mg via INTRAVENOUS

## 2020-01-26 SURGICAL SUPPLY — 33 items
BAG DRAIN CYSTO-URO LG1000N (MISCELLANEOUS) ×2 IMPLANT
BASKET ZERO TIP 1.9FR (BASKET) ×1 IMPLANT
BRUSH SCRUB EZ 1% IODOPHOR (MISCELLANEOUS) ×2 IMPLANT
BSKT STON RTRVL ZERO TP 1.9FR (BASKET) ×1
CATH URETL 5X70 OPEN END (CATHETERS) IMPLANT
CNTNR SPEC 2.5X3XGRAD LEK (MISCELLANEOUS) ×1
CONT SPEC 4OZ STER OR WHT (MISCELLANEOUS) ×1
CONT SPEC 4OZ STRL OR WHT (MISCELLANEOUS) ×1
CONTAINER SPEC 2.5X3XGRAD LEK (MISCELLANEOUS) IMPLANT
DRAPE UTILITY 15X26 TOWEL STRL (DRAPES) ×2 IMPLANT
FIBER LASER TRAC TIP (UROLOGICAL SUPPLIES) IMPLANT
FIBER LASER TRACTIP 200 (UROLOGICAL SUPPLIES) ×1 IMPLANT
GLOVE BIO SURGEON STRL SZ8 (GLOVE) ×2 IMPLANT
GOWN STRL REUS W/ TWL LRG LVL3 (GOWN DISPOSABLE) ×1 IMPLANT
GOWN STRL REUS W/ TWL XL LVL3 (GOWN DISPOSABLE) ×1 IMPLANT
GOWN STRL REUS W/TWL LRG LVL3 (GOWN DISPOSABLE) ×2
GOWN STRL REUS W/TWL XL LVL3 (GOWN DISPOSABLE) ×2
GUIDEWIRE STR DUAL SENSOR (WIRE) ×2 IMPLANT
INFUSOR MANOMETER BAG 3000ML (MISCELLANEOUS) ×2 IMPLANT
INTRODUCER DILATOR DOUBLE (INTRODUCER) IMPLANT
KIT TURNOVER CYSTO (KITS) ×2 IMPLANT
PACK CYSTO AR (MISCELLANEOUS) ×2 IMPLANT
SET CYSTO W/LG BORE CLAMP LF (SET/KITS/TRAYS/PACK) ×2 IMPLANT
SHEATH URETERAL 12FRX35CM (MISCELLANEOUS) IMPLANT
SOL .9 NS 3000ML IRR  AL (IV SOLUTION) ×1
SOL .9 NS 3000ML IRR AL (IV SOLUTION) ×1
SOL .9 NS 3000ML IRR UROMATIC (IV SOLUTION) ×1 IMPLANT
STENT URET 6FRX22 CONTOUR (STENTS) ×1 IMPLANT
STENT URET 6FRX24 CONTOUR (STENTS) IMPLANT
STENT URET 6FRX26 CONTOUR (STENTS) IMPLANT
SURGILUBE 2OZ TUBE FLIPTOP (MISCELLANEOUS) ×2 IMPLANT
VALVE UROSEAL ADJ ENDO (VALVE) IMPLANT
WATER STERILE IRR 1000ML POUR (IV SOLUTION) ×2 IMPLANT

## 2020-01-26 NOTE — Discharge Instructions (Addendum)
DISCHARGE INSTRUCTIONS FOR KIDNEY STONE/URETERAL STENT   MEDICATIONS:  1. Resume all your other meds from home.  2.  AZO (over-the-counter) can help with the burning/stinging when you urinate.   ACTIVITY:  1. May resume regular activities in 24 hours. 2. No driving while on narcotic pain medications  3. Drink plenty of water  4. Continue to walk at home - you can still get blood clots when you are at home, so keep active, but don't over do it.  5. May return to work/school tomorrow or when you feel ready   BATHING:  1. You can shower.   SIGNS/SYMPTOMS TO CALL:  Please call us if you have a fever greater than 101.5, uncontrolled nausea/vomiting, uncontrolled pain, dizziness, unable to urinate, excessively bloody urine, chest pain, shortness of breath, leg swelling, leg pain, or any other concerns or questions.   Urinary frequency, urgency, blood in the urine and bladder spasms are common postoperative symptoms.  You can reach [place] at [phone].   FOLLOW-UP:  1. You will be contacted by our office regarding an appointment for stent removal  AMBULATORY SURGERY  DISCHARGE INSTRUCTIONS   1) The drugs that you were given will stay in your system until tomorrow so for the next 24 hours you should not:  A) Drive an automobile B) Make any legal decisions C) Drink any alcoholic beverage   2) You may resume regular meals tomorrow.  Today it is better to start with liquids and gradually work up to solid foods.  You may eat anything you prefer, but it is better to start with liquids, then soup and crackers, and gradually work up to solid foods.   3) Please notify your doctor immediately if you have any unusual bleeding, trouble breathing, redness and pain at the surgery site, drainage, fever, or pain not relieved by medication.    4) Additional Instructions:        Please contact your physician with any problems or Same Day Surgery at [phone], Monday through Friday 6  am to 4 pm, or [place] at [facility] number at [phone].

## 2020-01-26 NOTE — Anesthesia Preprocedure Evaluation (Signed)
Anesthesia Evaluation  Patient identified by MRN, date of birth, ID band Patient awake    Reviewed: Allergy & Precautions, NPO status , Patient's Chart, lab work & pertinent test results  History of Anesthesia Complications Negative for: history of anesthetic complications  Airway Mallampati: III       Dental   Pulmonary asthma , sleep apnea and Continuous Positive Airway Pressure Ventilation , COPD,  COPD inhaler, Current Smoker,           Cardiovascular (-) hypertension(-) Past MI and (-) CHF (-) dysrhythmias (-) Valvular Problems/Murmurs     Neuro/Psych Seizures - (no meds for 1 year),  Anxiety Depression Bipolar Disorder    GI/Hepatic Neg liver ROS, neg GERD  ,  Endo/Other  neg diabetes  Renal/GU Renal disease (stones)     Musculoskeletal   Abdominal   Peds  Hematology   Anesthesia Other Findings   Reproductive/Obstetrics                             Anesthesia Physical Anesthesia Plan  ASA: III  Anesthesia Plan: General   Post-op Pain Management:    Induction: Intravenous  PONV Risk Score and Plan: 2 and Ondansetron and Dexamethasone  Airway Management Planned: Oral ETT  Additional Equipment:   Intra-op Plan:   Post-operative Plan:   Informed Consent: I have reviewed the patients History and Physical, chart, labs and discussed the procedure including the risks, benefits and alternatives for the proposed anesthesia with the patient or authorized representative who has indicated his/her understanding and acceptance.       Plan Discussed with:   Anesthesia Plan Comments:         Anesthesia Quick Evaluation

## 2020-01-26 NOTE — Interval H&P Note (Signed)
History and Physical Interval Note: 54 y.o. female presents for definitive stone management with left ureteroscopy/laser lithotripsy.  The procedure was discussed in detail including potential risks bleeding, infection, ureteral injury/ureteral stricture.  Potential anesthetic risks were discussed.  She indicated all questions were answered and desires to proceed.  CV: RRR Lungs: Clear  01/26/2020 7:23 AM  Nicole Wyatt  has presented today for surgery, with the diagnosis of left ureteral calculus.  The various methods of treatment have been discussed with the patient and family. After consideration of risks, benefits and other options for treatment, the patient has consented to  Procedure(s): CYSTOSCOPY/URETEROSCOPY/HOLMIUM LASER/STENT Exchange (Left) as a surgical intervention.  The patient's history has been reviewed, patient examined, no change in status, stable for surgery.  I have reviewed the patient's chart and labs.  Questions were answered to the patient's satisfaction.     Georges Victorio C Jaquarius Seder

## 2020-01-26 NOTE — Anesthesia Postprocedure Evaluation (Signed)
Anesthesia Post Note  Patient: Nicole Wyatt  Procedure(s) Performed: CYSTOSCOPY/URETEROSCOPY/HOLMIUM LASER/STENT Exchange (Left )  Patient location during evaluation: PACU Anesthesia Type: General Level of consciousness: awake and alert Pain management: pain level controlled Vital Signs Assessment: post-procedure vital signs reviewed and stable Respiratory status: spontaneous breathing and respiratory function stable Cardiovascular status: stable Anesthetic complications: no     Last Vitals:  Vitals:   01/26/20 0943 01/26/20 0959  BP: 107/66 (!) 135/53  Pulse: 63 61  Resp: 16 14  Temp:  36.8 C  SpO2: 97% 97%    Last Pain:  Vitals:   01/26/20 0959  TempSrc: Temporal  PainSc: 4                  [name] K

## 2020-01-26 NOTE — Op Note (Signed)
Preoperative diagnosis: Left distal ureteral calculus  Postoperative diagnosis: Left distal ureteral calculus  Procedure:  1. Cystoscopy 2. Left ureteroscopy and stone removal 3. Ureteroscopic laser lithotripsy 4. Left ureteral stent placement (6FR/22 cm 5. Left retrograde pyelography with interpretation  Surgeon: Lorin Picket C. Jolly Carlini, M.D.  Anesthesia: General  Complications: None  Intraoperative findings: 1.  Left retrograde pyelography post procedure showed no filling defects, stone fragments or contrast extravasation  EBL: Minimal  Specimens: 1. Calculus fragments for analysis   Indication: Nicole Wyatt is a 54 y.o. year old patient status post urgent left ureteral stent placement on 01/05/2020 for a 12 mm obstructing left UVJ calculus with chills and pyuria.  She presents today for definitive stone treatment. After reviewing the management options for treatment, the patient elected to proceed with the above surgical procedure(s). We have discussed the potential benefits and risks of the procedure, side effects of the proposed treatment, the likelihood of the patient achieving the goals of the procedure, and any potential problems that might occur during the procedure or recuperation. Informed consent has been obtained.  Description of procedure:  The patient was taken to the operating room and general anesthesia was induced.  The patient was placed in the dorsal lithotomy position, prepped and draped in the usual sterile fashion, and preoperative antibiotics were administered. A preoperative time-out was performed.   A 21 French cystoscope was lubricated and passed per urethra panendoscopy was performed and the bladder mucosa showed no erythema, solid or papillary lesions with the exception of inflammatory changes at the left ureteral orifice secondary to her indwelling stent.  The left ureteral stent was grasped with endoscopic forceps and brought out to the urethral meatus.  A  0.038 Sensor wire was then placed to the stent and into the renal pelvis under fluoroscopic guidance.  The ureteral stent was removed.  The calculus was visualized in the midportion of the distal ureter on fluoroscopy.  A 6.5 Fr semirigid ureteroscope was then advanced into the ureter next to the guidewire and the calculus was identified.  The stone was then fragmented with a 200 micron holmium laser fiber on a setting of 0.5 J and frequency of 10 Hz.   All fragments were then removed from the ureter with a zero tip nitinol basket.  Reinspection of the ureter revealed no remaining visible stones or significantly sized fragments.   Retrograde pyelogram was performed with findings as described above.  A 6 FR/22 CM stent was was placed under fluoroscopic guidance.  The wire was then removed with an adequate stent curl noted in the renal pelvis as well as in the bladder.  The bladder was then emptied and the procedure ended.  The patient appeared to tolerate the procedure well and without complications.  After anesthetic reversal the patient was transported to the PACU in stable condition.   Plan: She will be scheduled for office cystoscopy with stent removal in 1 week.   Irineo Axon, MD

## 2020-01-26 NOTE — Anesthesia Procedure Notes (Signed)
Procedure Name: Intubation Performed by: Fletcher-Harrison, Jaishaun Mcnab, CRNA Pre-anesthesia Checklist: Patient identified, Emergency Drugs available, Suction available and Patient being monitored Patient Re-evaluated:Patient Re-evaluated prior to induction Oxygen Delivery Method: Circle system utilized Preoxygenation: Pre-oxygenation with 100% oxygen Induction Type: IV induction Ventilation: Mask ventilation without difficulty Laryngoscope Size: McGraph and 3 Grade View: Grade I Tube type: Oral Tube size: 7.0 mm Number of attempts: 1 Airway Equipment and Method: Stylet Placement Confirmation: ETT inserted through vocal cords under direct vision,  positive ETCO2,  CO2 detector and breath sounds checked- equal and bilateral Secured at: 21 cm Tube secured with: Tape Dental Injury: Teeth and Oropharynx as per pre-operative assessment        

## 2020-01-26 NOTE — Transfer of Care (Signed)
Immediate Anesthesia Transfer of Care Note  Patient: Nicole Wyatt  Procedure(s) Performed: CYSTOSCOPY/URETEROSCOPY/HOLMIUM LASER/STENT Exchange (Left )  Patient Location: PACU  Anesthesia Type:General  Level of Consciousness: awake, drowsy and patient cooperative  Airway & Oxygen Therapy: Patient Spontanous Breathing and Patient connected to face mask oxygen  Post-op Assessment: Report given to RN and Post -op Vital signs reviewed and stable  Post vital signs: Reviewed and stable  Last Vitals:  Vitals Value Taken Time  BP 122/67 01/26/20 0900  Temp    Pulse 74 01/26/20 0905  Resp    SpO2 100 % 01/26/20 0905  Vitals shown include unvalidated device data.  Last Pain:  Vitals:   01/26/20 0615  TempSrc: Tympanic  PainSc: 3          Complications: No apparent anesthesia complications

## 2020-01-27 ENCOUNTER — Telehealth: Payer: Self-pay | Admitting: Urology

## 2020-01-27 NOTE — Telephone Encounter (Signed)
Notified patient as instructed, patient pleased. Discussed follow-up appointments, patient agrees appt 02/05/2020

## 2020-01-27 NOTE — Telephone Encounter (Signed)
Pt called and asked if she was to continue her Cipro and Oxybutnin post surgery. Please advise.

## 2020-01-27 NOTE — Telephone Encounter (Signed)
-----   Message from Scott C Stoioff, MD sent at 01/26/2020  8:57 AM EST ----- Regarding: Stent removal Needs appointment for cystoscopy/stent removal approximately 1 week  

## 2020-01-28 ENCOUNTER — Telehealth: Payer: Self-pay | Admitting: Urology

## 2020-01-28 NOTE — Telephone Encounter (Signed)
Tried calling the patient but first time no vm Second time the number we have on file the person that answered the phone said it was the wrong number   Nicole Wyatt

## 2020-01-28 NOTE — Telephone Encounter (Signed)
-----   Message from Riki Altes, MD sent at 01/26/2020  8:57 AM EST ----- Regarding: Stent removal Needs appointment for cystoscopy/stent removal approximately 1 week

## 2020-01-29 ENCOUNTER — Telehealth: Payer: Self-pay

## 2020-01-29 NOTE — Telephone Encounter (Signed)
-----   Message from Debbe Odea, MD sent at 01/22/2020  8:14 AM EST ----- Normal ejection fraction, mild LVH, impaired relaxation.  Mild mitral valve regurgitation.  Overall okay study.

## 2020-01-29 NOTE — Telephone Encounter (Signed)
Patient made aware of echo results with verbalized understanding. 

## 2020-01-29 NOTE — Telephone Encounter (Signed)
Patient has not reviewed the echo results in mychart. Called the patient to give results. Unable to lmom. Pt voice nail is not set up. Telephone encounter started.

## 2020-01-31 NOTE — Telephone Encounter (Signed)
Recc sending a letter

## 2020-02-02 ENCOUNTER — Other Ambulatory Visit: Payer: Medicare Other

## 2020-02-05 ENCOUNTER — Other Ambulatory Visit: Payer: Self-pay

## 2020-02-05 ENCOUNTER — Ambulatory Visit: Payer: Medicare Other | Admitting: Urology

## 2020-02-05 ENCOUNTER — Encounter: Payer: Self-pay | Admitting: Urology

## 2020-02-05 ENCOUNTER — Ambulatory Visit: Payer: Medicare Other | Admitting: Cardiology

## 2020-02-05 VITALS — BP 141/82 | HR 80 | Ht 67.0 in | Wt 236.0 lb

## 2020-02-05 DIAGNOSIS — N201 Calculus of ureter: Secondary | ICD-10-CM | POA: Diagnosis not present

## 2020-02-05 DIAGNOSIS — Z9889 Other specified postprocedural states: Secondary | ICD-10-CM

## 2020-02-05 LAB — URINALYSIS, COMPLETE
Bilirubin, UA: NEGATIVE
Glucose, UA: NEGATIVE
Ketones, UA: NEGATIVE
Nitrite, UA: NEGATIVE
Protein,UA: NEGATIVE
Specific Gravity, UA: 1.025 (ref 1.005–1.030)
Urobilinogen, Ur: 0.2 mg/dL (ref 0.2–1.0)
pH, UA: 7 (ref 5.0–7.5)

## 2020-02-05 LAB — MICROSCOPIC EXAMINATION
Bacteria, UA: NONE SEEN
RBC, Urine: >30 /hpf — AB (ref 0–2)

## 2020-02-05 NOTE — Progress Notes (Signed)
Indications: Patient is 54 y.o., female status post right ureteroscopy/laser lithotripsy of a 12 mm left distal ureteral calculus 01/26/2020.  She had no postop problems and is presenting today for stent removal.  Stone analysis is pending  Procedure:  Flexible Cystoscopy with stent removal (40370)  Timeout was performed and the correct patient, procedure and participants were identified.    Description:  The patient was prepped and draped in the usual sterile fashion. Flexible cystosopy was performed.  The stent was visualized, grasped, and removed intact without difficulty. The patient tolerated the procedure well.  A single dose of oral antibiotics was given.  Complications:  None  Plan:  - She was directed to call for flank pain or fever post stent removal -Follow-up 1 month for review of stone analysis and discussion of metabolic evaluation   Irineo Axon, MD

## 2020-02-08 LAB — CALCULI, WITH PHOTOGRAPH (CLINICAL LAB)
Calcium Oxalate Monohydrate: 30 %
Carbonate Apatite: 50 %
Mg NH4 PO4 (Struvite): 20 %
Weight Calculi: 44 mg

## 2020-02-17 ENCOUNTER — Ambulatory Visit: Payer: Medicare Other | Admitting: Urology

## 2020-02-24 ENCOUNTER — Emergency Department
Admission: EM | Admit: 2020-02-24 | Discharge: 2020-02-24 | Disposition: A | Discharge status: Home / Self Care | Payer: Medicare Other | Attending: Emergency Medicine | Admitting: Emergency Medicine

## 2020-02-24 ENCOUNTER — Other Ambulatory Visit: Payer: Self-pay

## 2020-02-24 ENCOUNTER — Emergency Department: Payer: Medicare Other

## 2020-02-24 ENCOUNTER — Ambulatory Visit: Payer: Medicare Other | Admitting: Urology

## 2020-02-24 DIAGNOSIS — R079 Chest pain, unspecified: Secondary | ICD-10-CM | POA: Diagnosis present

## 2020-02-24 DIAGNOSIS — K2960 Other gastritis without bleeding: Secondary | ICD-10-CM | POA: Diagnosis not present

## 2020-02-24 DIAGNOSIS — Z20822 Contact with and (suspected) exposure to covid-19: Secondary | ICD-10-CM | POA: Insufficient documentation

## 2020-02-24 DIAGNOSIS — Z7982 Long term (current) use of aspirin: Secondary | ICD-10-CM | POA: Diagnosis not present

## 2020-02-24 DIAGNOSIS — Z96642 Presence of left artificial hip joint: Secondary | ICD-10-CM | POA: Insufficient documentation

## 2020-02-24 DIAGNOSIS — Z79899 Other long term (current) drug therapy: Secondary | ICD-10-CM | POA: Insufficient documentation

## 2020-02-24 DIAGNOSIS — F17210 Nicotine dependence, cigarettes, uncomplicated: Secondary | ICD-10-CM | POA: Diagnosis not present

## 2020-02-24 DIAGNOSIS — J449 Chronic obstructive pulmonary disease, unspecified: Secondary | ICD-10-CM | POA: Diagnosis not present

## 2020-02-24 LAB — BASIC METABOLIC PANEL
Anion gap: 12 (ref 5–15)
BUN: 12 mg/dL (ref 6–20)
CO2: 26 mmol/L (ref 22–32)
Calcium: 9.2 mg/dL (ref 8.9–10.3)
Chloride: 101 mmol/L (ref 98–111)
Creatinine, Ser: 0.82 mg/dL (ref 0.44–1.00)
GFR calc Af Amer: >60 mL/min (ref >60)
GFR calc non Af Amer: >60 mL/min (ref >60)
Glucose, Bld: 98 mg/dL (ref 70–99)
Potassium: 3.7 mmol/L (ref 3.5–5.1)
Sodium: 139 mmol/L (ref 135–145)

## 2020-02-24 LAB — CBC
HCT: 42.5 % (ref 36.0–46.0)
Hemoglobin: 13.9 g/dL (ref 12.0–15.0)
MCH: 30.6 pg (ref 26.0–34.0)
MCHC: 32.7 g/dL (ref 30.0–36.0)
MCV: 93.6 fL (ref 80.0–100.0)
Platelets: 243 10*3/uL (ref 150–400)
RBC: 4.54 MIL/uL (ref 3.87–5.11)
RDW: 14.6 % (ref 11.5–15.5)
WBC: 5.5 10*3/uL (ref 4.0–10.5)
nRBC: 0 % (ref 0.0–0.2)

## 2020-02-24 LAB — TROPONIN I (HIGH SENSITIVITY)
Troponin I (High Sensitivity): 5 ng/L (ref <18)
Troponin I (High Sensitivity): 6 ng/L (ref <18)

## 2020-02-24 LAB — HEPATIC FUNCTION PANEL
ALT: 17 U/L (ref 0–44)
AST: 20 U/L (ref 15–41)
Albumin: 4.1 g/dL (ref 3.5–5.0)
Alkaline Phosphatase: 77 U/L (ref 38–126)
Bilirubin, Direct: <0.1 mg/dL (ref 0.0–0.2)
Total Bilirubin: 0.6 mg/dL (ref 0.3–1.2)
Total Protein: 8.1 g/dL (ref 6.5–8.1)

## 2020-02-24 LAB — POCT PREGNANCY, URINE: Preg Test, Ur: NEGATIVE

## 2020-02-24 LAB — POC SARS CORONAVIRUS 2 AG: SARS Coronavirus 2 Ag: NEGATIVE

## 2020-02-24 LAB — LIPASE, BLOOD: Lipase: 32 U/L (ref 11–51)

## 2020-02-24 MED ORDER — ONDANSETRON 4 MG PO TBDP: 4.0000 mg | ORAL_TABLET | Freq: Three times a day (TID) | ORAL | 0 refills | Status: DC | PRN

## 2020-02-24 MED ORDER — PANTOPRAZOLE SODIUM 40 MG PO TBEC
40.0000 mg | DELAYED_RELEASE_TABLET | Freq: Once | ORAL | Status: AC
  Administered 2020-02-24: 40 mg via ORAL
  Filled 2020-02-24: qty 1

## 2020-02-24 MED ORDER — PANTOPRAZOLE SODIUM 40 MG PO TBEC: 40.0000 mg | DELAYED_RELEASE_TABLET | Freq: Every day | ORAL | 1 refills | Status: DC

## 2020-02-24 MED ORDER — ONDANSETRON HCL 4 MG/2ML IJ SOLN
4.0000 mg | Freq: Once | INTRAMUSCULAR | Status: AC
  Administered 2020-02-24: 4 mg via INTRAVENOUS
  Filled 2020-02-24: qty 2

## 2020-02-24 MED ORDER — LIDOCAINE VISCOUS HCL 2 % MT SOLN
15.0000 mL | Freq: Once | OROMUCOSAL | Status: AC
  Administered 2020-02-24: 15 mL via ORAL
  Filled 2020-02-24: qty 15

## 2020-02-24 MED ORDER — SODIUM CHLORIDE 0.9 % IV BOLUS
1000.0000 mL | Freq: Once | INTRAVENOUS | Status: AC
  Administered 2020-02-24: 1000 mL via INTRAVENOUS

## 2020-02-24 MED ORDER — ALUM & MAG HYDROXIDE-SIMETH 200-200-20 MG/5ML PO SUSP
30.0000 mL | Freq: Once | ORAL | Status: AC
  Administered 2020-02-24: 30 mL via ORAL
  Filled 2020-02-24: qty 30

## 2020-02-24 NOTE — ED Provider Notes (Signed)
[facility] Emergency Department Provider Note  Time seen: 6:14 PM  I have reviewed the triage vital signs and the nursing notes.   HISTORY  Chief Complaint Chest Pain   HPI Nicole Wyatt is a 54 y.o. female with a past medical history anxiety, arthritis, bipolar, COPD, presents to the emergency department for intermittent chest pain.  According to the patient for the past 4 weeks or so she has been experiencing intermittent chest pain, described as a burning sensation in the center of her chest and sometimes tingling in her left arm.  States it has been happening on a daily basis.  She also states she has been experiencing worse gastric reflux, with occasional vomiting after eating.  States mild upper abdominal pain which she relates more to the vomiting.  Denies any fever cough or shortness of breath.  No diarrhea.   Past Medical History:  Diagnosis Date  . Abnormal Pap smear of cervix   . Anxiety   . Arthritis   . Bipolar 1 disorder ([place])   . COPD (chronic obstructive pulmonary disease) ([place])   . Depression   . Dyspnea    with exertion   . History of kidney stones   . Oxygen dependent    patient uses 2L oz PRN ; reports hardly ever uses it unless very SOB    . Rocky Mountain spotted fever 2018  . Sarcoidosis of lung ([place])    managed by Dr Lillette Boxer   . Seizures ([place]) last seizure 05/2013  . Sleep apnea    uses CPAP nightly   . UTI (urinary tract infection) 11/13/2018   see ED visit in epic ; was dc'd with oral abx and reports completed and relief of sx     Patient Active Problem List   Diagnosis Date Noted  . Kidney stone 01/05/2020  . Atopic dermatitis 09/08/2019  . Inflammatory polyarthritis ([place]) 09/08/2019  . Acute cystitis with hematuria 09/08/2019  . Other fatigue 07/19/2019  . Hypoglycemia 07/19/2019  . Arm weakness 03/07/2019  . Influenza A 02/24/2019  . Acute upper respiratory infection 02/24/2019  . Chronic obstructive pulmonary  disease ([place]) 02/24/2019  . Acute otitis externa of both ears 01/06/2019  . Mild intermittent asthma without complication 94/85/4627  . Primary osteoarthritis of hip 12/02/2018  . 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 OSA (obstructive sleep apnea) 10/13/2018  . 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 Rocky [place] spotted fever 07/02/2018  . Urinary tract infection without hematuria 07/02/2018  . Dysuria 06/23/2018  . Depression, major, recurrent, moderate ([place]) 03/26/2018  . Generalized edema 03/26/2018  . Anaphylactic syndrome 03/26/2018  . Cutaneous candidiasis 03/26/2018  . Bursitis of hip 02/03/2018  . Osteoarthritis of knee 02/03/2018  . Chronic pulmonary hypertension ([place]) 06/18/2016  . S/P cardiac cath 06/18/2016  . Unstable angina ([place]) 05/24/2016  . Bilateral leg edema 05/22/2016  . Shortness of breath 04/30/2016  . Skin lesion of right arm 03/01/2016  . Screening for breast cancer 06/10/2015  . Skin lesion of breast 06/10/2015  . Hx of seizure disorder 06/24/2014  . Left-sided weakness 06/24/2014  . Tobacco abuse 06/24/2014    Past Surgical History:  Procedure Laterality Date  . ABDOMINAL HYSTERECTOMY  1999  . BLADDER SURGERY    . CARDIAC CATHETERIZATION Bilateral 05/29/2016   Procedure: Right/Left Heart Cath and Coronary Angiography;  Surgeon: Corey Skains, MD;  Location: [facility];  Service: Cardiovascular;  Laterality: Bilateral;  . COLONOSCOPY    . COLONOSCOPY  N/A 07/19/2015   Procedure: COLONOSCOPY;  Surgeon: Earline Mayotte, MD;  Location[facility] ENDOSCOPY;  Service: Endoscopy;  Laterality: N/A;  . CYSTOSCOPY WITH STENT PLACEMENT Left 01/05/2020   Procedure: CYSTOSCOPY WITH STENT PLACEMENT;  Surgeon: Riki Altes, MD;  Location: [facility] ORS;  Service: Urology;  Laterality: Left;  . CYSTOSCOPY/URETEROSCOPY/HOLMIUM LASER/STENT PLACEMENT Left 01/26/2020   Procedure: CYSTOSCOPY/URETEROSCOPY/HOLMIUM LASER/STENT Exchange;  Surgeon: Riki Altes, MD;   Location: [facility] ORS;  Service: Urology;  Laterality: Left;  . FLEXIBLE BRONCHOSCOPY N/A 07/27/2016   Procedure: FLEXIBLE BRONCHOSCOPY;  Surgeon: Yevonne Pax, MD;  Location: [facility] ORS;  Service: Pulmonary;  Laterality: N/A;  . FOOT SURGERY    . JOINT REPLACEMENT Left    hip  . KIDNEY STONE SURGERY  7 years  . TOTAL HIP ARTHROPLASTY Left 12/02/2018   Procedure: TOTAL HIP ARTHROPLASTY ANTERIOR APPROACH;  Surgeon: Sheral Apley, MD;  Location: [facility] ORS;  Service: Orthopedics;  Laterality: Left;  . TUBAL LIGATION      Prior to Admission medications   Medication Sig Start Date End Date Taking? Authorizing Provider  albuterol (VENTOLIN HFA) 108 (90 Base) MCG/ACT inhaler Inhale 2 puffs into the lungs every 4 (four) hours as needed for wheezing or shortness of breath. 01/22/20   Dunn, Raymon Mutton, PA-C  aspirin EC 81 MG tablet Take 1 tablet (81 mg total) by mouth 2 (two) times daily. For DVT prophylaxis for 30 days after surgery. Patient taking differently: Take 81 mg by mouth daily.  12/02/18   Albina Billet III, PA-C  ciprofloxacin (CIPRO) 500 MG tablet Take 1 tablet (500 mg total) by mouth every 12 (twelve) hours. 01/25/20   Michiel Cowboy A, PA-C  fluticasone (FLONASE) 50 MCG/ACT nasal spray Place 2 sprays into both nostrils daily. Patient taking differently: Place 2 sprays into both nostrils daily as needed for allergies.  02/16/19   Sherrie Mustache Roselyn Bering, PA-C  fluticasone (FLOVENT HFA) 110 MCG/ACT inhaler Inhale 2 puffs 2 x day for copd. Rinse mouth afterwards 01/22/20   Sondra Barges, PA-C  furosemide (LASIX) 40 MG tablet Take 1 tablet (40 mg total) by mouth daily as needed for fluid. Patient taking differently: Take 40 mg by mouth daily as needed for fluid or edema.  01/06/19   Carlean Jews, NP  HYDROcodone-acetaminophen (NORCO/VICODIN) 5-325 MG tablet Take 1 tablet by mouth every 6 (six) hours as needed for moderate pain. Patient not taking: Reported on 02/05/2020 01/21/20   Michiel Cowboy A,  PA-C  meloxicam (MOBIC) 15 MG tablet Take 1 tablet (15 mg total) by mouth daily. 11/02/19   Carlean Jews, NP  mometasone-formoterol (DULERA) 100-5 MCG/ACT AERO Inhale 2 puffs into the lungs daily as needed for wheezing or shortness of breath. 03/10/19   Altamese Dilling, MD  neomycin-polymyxin-hydrocortisone (CORTISPORIN) 3.5-10000-1 ophthalmic suspension Insert four drops in both ears twice daily for seven days. Patient taking differently: 4 drops See admin instructions. Insert four drops in both ears twice daily for seven days as needed for ear pain 08/27/19   Carlean Jews, NP  ondansetron (ZOFRAN) 4 MG tablet Take 1 tablet (4 mg total) by mouth every 8 (eight) hours as needed for nausea or vomiting. Patient not taking: Reported on 02/05/2020 01/12/20   Riki Altes, MD  oxybutynin (DITROPAN) 5 MG tablet Take 1 tablet (5 mg total) by mouth 3 (three) times daily. Patient not taking: Reported on 02/05/2020 01/06/20   Michiel Cowboy A, PA-C  tamsulosin (FLOMAX) 0.4 MG CAPS capsule  Take 1 capsule (0.4 mg total) by mouth daily. Patient not taking: Reported on 02/05/2020 01/06/20   Michiel Cowboy A, PA-C  triamcinolone (KENALOG) 0.025 % cream Apply 1 application topically 2 (two) times daily. Patient taking differently: Apply 1 application topically 3 (three) times daily.  08/27/19   Carlean Jews, NP  venlafaxine XR (EFFEXOR-XR) 75 MG 24 hr capsule Take 2 capsules (150 mg total) by mouth daily with breakfast. 11/02/19   Carlean Jews, NP    Allergies  Allergen Reactions  . Bee Venom Anaphylaxis  . Ciprofloxacin Nausea Only  . Penicillins Diarrhea and Rash    Did it involve swelling of the face/tongue/throat, SOB, or low BP? No Did it involve sudden or severe rash/hives, skin peeling, or any reaction on the inside of your mouth or nose? No Did you need to seek medical attention at a hospital or doctor's office? No When did it last happen?4-5 years If all above answers  are "NO", may proceed with cephalosporin use.      Family History  Problem Relation Age of Onset  . Hypertension Mother   . Arthritis Mother   . Heart disease Mother   . Heart failure Mother   . Hypertension Father   . Arthritis Father   . Prostate cancer Father   . Heart disease Father   . Heart attack Father   . Arrhythmia Father 79       pacemaker implant  . Ovarian cancer Maternal Grandmother     Social History Social History   Tobacco Use  . Smoking status: Current Every Day Smoker    Packs/day: 0.50    Years: 14.00    Pack years: 7.00    Types: Cigarettes    Last attempt to quit: 05/07/2016    Years since quitting: 3.8  . Smokeless tobacco: Never Used  . Tobacco comment: 11-25-18 reports  down to just 1 cigarette per day   Substance Use Topics  . Alcohol use: Yes    Alcohol/week: 0.0 standard drinks    Comment: seldom   . Drug use: No    Review of Systems Constitutional: Negative for fever. Cardiovascular: Intermittent chest pain Respiratory: Negative for shortness of breath. Gastrointestinal: Mild upper abdominal soreness.  Positive for nausea vomiting.  Negative for diarrhea Genitourinary: Negative for urinary compaints Neurological: Negative for headache All other ROS negative  ____________________________________________   PHYSICAL EXAM:  VITAL SIGNS: ED Triage Vitals  Enc Vitals Group     BP 02/24/20 1627 (!) 145/79     Pulse Rate 02/24/20 1627 74     Resp 02/24/20 1627 18     Temp 02/24/20 1627 99.5 F (37.5 C)     Temp Source 02/24/20 1627 Oral     SpO2 02/24/20 1627 93 %     Weight 02/24/20 1625 235 lb 14.3 oz (107 kg)     Height 02/24/20 1625 5\' 7"  (1.702 m)     Head Circumference --      Peak Flow --      Pain Score 02/24/20 1625 8     Pain Loc --      Pain Edu? --      Excl. in GC? --    Constitutional: Alert and oriented. Well appearing and in no distress. Eyes: Normal exam ENT      Head: Normocephalic and atraumatic.       Mouth/Throat: Mucous membranes are moist. Cardiovascular: Normal rate, regular rhythm.  Respiratory: Normal respiratory effort without tachypnea nor  retractions. Breath sounds are clear  Gastrointestinal: Slight epigastric tenderness otherwise benign abdomen without rebound guarding or distention. Musculoskeletal: Nontender with normal range of motion in all extremities.  Neurologic:  Normal speech and language. No gross focal neurologic deficits  Skin:  Skin is warm, dry and intact.  Psychiatric: Mood and affect are normal.   ____________________________________________    EKG  EKG viewed and interpreted by myself shows a normal sinus rhythm at 72 bpm with a slightly widened QRS, largely normal axis, normal intervals besides borderline PR prolongation.  Nonspecific ST findings.  ____________________________________________    RADIOLOGY  Chest x-ray is negative  ____________________________________________   INITIAL IMPRESSION / ASSESSMENT AND PLAN / ED COURSE  Pertinent labs & imaging results that were available during my care of the patient were reviewed by me and considered in my medical decision making (see chart for details).   Patient presents emergency department for intermittent chest discomfort nausea vomiting.  Overall the patient appears well she does have mild epigastric tenderness.  We will add on a hepatic function panel and lipase.  Denies any alcohol use.  Patient's troponin is negative EKG shows no concerning findings at this time.  Chest x-ray is clear.  Overall patient appears well, we will treat with a GI cocktail to see if this helps with her symptoms.  We will also IV hydrate treat with Zofran.  Patient's work-up is essentially negative.  Reassuring labs including a negative troponin.  Negative lipase and LFTs.  Patient states she is feeling much better after GI cocktail.  We will discharge home with Protonix to treat for gastritis/esophagitis and have the  patient follow-up with GI medicine.  Tameria L Parr was evaluated in Emergency Department on 02/24/2020 for the symptoms described in the history of present illness. She was evaluated in the context of the global COVID-19 pandemic, which necessitated consideration that the patient might be at risk for infection with the SARS-CoV-2 virus that causes COVID-19. Institutional protocols and algorithms that pertain to the evaluation of patients at risk for COVID-19 are in a state of rapid change based on information released by regulatory bodies including the CDC and federal and state organizations. These policies and algorithms were followed during the patient's care in the ED.  ____________________________________________   FINAL CLINICAL IMPRESSION(S) / ED DIAGNOSES  Chest pain Reflux   Minna Antis, MD 02/24/20 1944

## 2020-02-24 NOTE — ED Notes (Signed)
Second troponin obtained.

## 2020-02-24 NOTE — ED Triage Notes (Signed)
Pt comes POV with CP for 3 to 4 weeks. Pt states that it's worse than reflux. Pt also has been vomiting and having trouble keeping even water down.

## 2020-02-29 ENCOUNTER — Ambulatory Visit: Payer: Medicare Other | Admitting: Urology

## 2020-02-29 ENCOUNTER — Encounter: Payer: Self-pay | Admitting: Urology

## 2020-05-10 ENCOUNTER — Emergency Department
Admission: EM | Admit: 2020-05-10 | Discharge: 2020-05-10 | Disposition: A | Discharge status: Home / Self Care | Payer: Medicare Other | Attending: Emergency Medicine | Admitting: Emergency Medicine

## 2020-05-10 ENCOUNTER — Other Ambulatory Visit: Payer: Self-pay

## 2020-05-10 ENCOUNTER — Emergency Department: Payer: Medicare Other

## 2020-05-10 ENCOUNTER — Encounter: Payer: Self-pay | Admitting: Emergency Medicine

## 2020-05-10 DIAGNOSIS — Z79899 Other long term (current) drug therapy: Secondary | ICD-10-CM | POA: Diagnosis not present

## 2020-05-10 DIAGNOSIS — Z7982 Long term (current) use of aspirin: Secondary | ICD-10-CM | POA: Diagnosis not present

## 2020-05-10 DIAGNOSIS — J449 Chronic obstructive pulmonary disease, unspecified: Secondary | ICD-10-CM | POA: Diagnosis not present

## 2020-05-10 DIAGNOSIS — R0789 Other chest pain: Secondary | ICD-10-CM | POA: Diagnosis present

## 2020-05-10 DIAGNOSIS — R079 Chest pain, unspecified: Secondary | ICD-10-CM

## 2020-05-10 DIAGNOSIS — F17210 Nicotine dependence, cigarettes, uncomplicated: Secondary | ICD-10-CM | POA: Diagnosis not present

## 2020-05-10 LAB — BASIC METABOLIC PANEL
Anion gap: 8 (ref 5–15)
BUN: 18 mg/dL (ref 6–20)
CO2: 27 mmol/L (ref 22–32)
Calcium: 8.7 mg/dL — ABNORMAL LOW (ref 8.9–10.3)
Chloride: 104 mmol/L (ref 98–111)
Creatinine, Ser: 0.75 mg/dL (ref 0.44–1.00)
GFR calc Af Amer: >60 mL/min (ref >60)
GFR calc non Af Amer: >60 mL/min (ref >60)
Glucose, Bld: 98 mg/dL (ref 70–99)
Potassium: 3.6 mmol/L (ref 3.5–5.1)
Sodium: 139 mmol/L (ref 135–145)

## 2020-05-10 LAB — CBC
HCT: 40.1 % (ref 36.0–46.0)
Hemoglobin: 13.5 g/dL (ref 12.0–15.0)
MCH: 31.6 pg (ref 26.0–34.0)
MCHC: 33.7 g/dL (ref 30.0–36.0)
MCV: 93.9 fL (ref 80.0–100.0)
Platelets: 248 10*3/uL (ref 150–400)
RBC: 4.27 MIL/uL (ref 3.87–5.11)
RDW: 14.5 % (ref 11.5–15.5)
WBC: 5.2 10*3/uL (ref 4.0–10.5)
nRBC: 0 % (ref 0.0–0.2)

## 2020-05-10 LAB — TROPONIN I (HIGH SENSITIVITY)
Troponin I (High Sensitivity): 5 ng/L (ref <18)
Troponin I (High Sensitivity): 6 ng/L (ref <18)

## 2020-05-10 MED ORDER — SODIUM CHLORIDE 0.9% FLUSH: 3.0000 mL | Freq: Once | INTRAVENOUS | Status: DC

## 2020-05-10 MED ORDER — ONDANSETRON HCL 4 MG/2ML IJ SOLN
4.0000 mg | Freq: Once | INTRAMUSCULAR | Status: AC
  Administered 2020-05-10: 4 mg via INTRAVENOUS
  Filled 2020-05-10: qty 2

## 2020-05-10 NOTE — ED Notes (Signed)
Pt vomited on the floor clear water like emesis. MD notified, verbal order for 4mg  of zorfan

## 2020-05-10 NOTE — ED Triage Notes (Signed)
Pt comes into the ED via [facility] with c/o CP for the past week with SOB, RA 96%, 160/90, 140/88, 60SR, pt is in NAD.

## 2020-05-10 NOTE — ED Provider Notes (Signed)
[facility] Emergency Department Provider Note    ____________________________________________   I have reviewed the triage vital signs and the nursing notes.   HISTORY  Chief Complaint Chest Pain   History limited by: Not Limited   HPI Nicole Wyatt is a 54 y.o. female who presents to the emergency department today because of concerns for chest pain.  She states has been going on for roughly 1 week.  It is located in the left center chest.  It does radiate up into her left arm.  The patient states that when the pain comes it will last for about a minute or two.  She has not noticed any pattern to what brings the pain on or makes it better.  She states she does have a history of GERD but that pain is felt different to her in the past.  Patient states she does have family history of heart disease.  Does smoke although is working on cutting down.   Records reviewed. Per medical record review patient has a history of ER visit a couple of months ago for chest pain.   Past Medical History:  Diagnosis Date  . Abnormal Pap smear of cervix   . Anxiety   . Arthritis   . Bipolar 1 disorder (HCC)   . COPD (chronic obstructive pulmonary disease) (HCC)   . Depression   . Dyspnea    with exertion   . History of kidney stones   . Oxygen dependent    patient uses 2L oz PRN ; reports hardly ever uses it unless very SOB    . Rocky Mountain spotted fever 2018  . Sarcoidosis of lung (HCC)    managed by Dr Lindie Spruce   . Seizures (HCC) last seizure 05/2013  . Sleep apnea    uses CPAP nightly   . UTI (urinary tract infection) 11/13/2018   see ED visit in epic ; was dc'd with oral abx and reports completed and relief of sx     Patient Active Problem List   Diagnosis Date Noted  . Kidney stone 01/05/2020  . Atopic dermatitis 09/08/2019  . 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/08/2019  . Acute cystitis with hematuria 09/08/2019  . Other fatigue 07/19/2019  .  Hypoglycemia 07/19/2019  . Arm weakness 03/07/2019  . Influenza A 02/24/2019  . Acute upper respiratory infection 02/24/2019  . Chronic obstructive pulmonary disease (HCC) 02/24/2019  . Acute otitis externa of both ears 01/06/2019  . Mild intermittent asthma without complication 01/06/2019  . Primary osteoarthritis of hip 12/02/2018  . 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 OSA (obstructive sleep apnea) 10/13/2018  . 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 Rocky [place] spotted fever 07/02/2018  . Urinary tract infection without hematuria 07/02/2018  . Dysuria 06/23/2018  . Depression, major, recurrent, moderate (HCC) 03/26/2018  . Generalized edema 03/26/2018  . Anaphylactic syndrome 03/26/2018  . Cutaneous candidiasis 03/26/2018  . Bursitis of hip 02/03/2018  . Osteoarthritis of knee 02/03/2018  . Chronic pulmonary hypertension (HCC) 06/18/2016  . S/P cardiac cath 06/18/2016  . Unstable angina (HCC) 05/24/2016  . Bilateral leg edema 05/22/2016  . Shortness of breath 04/30/2016  . Skin lesion of right arm 03/01/2016  . Screening for breast cancer 06/10/2015  . Skin lesion of breast 06/10/2015  . Hx of seizure disorder 06/24/2014  . Left-sided weakness 06/24/2014  . Tobacco abuse 06/24/2014    Past Surgical History:  Procedure Laterality Date  . ABDOMINAL HYSTERECTOMY  1999  . BLADDER SURGERY    .  CARDIAC CATHETERIZATION Bilateral 05/29/2016   Procedure: Right/Left Heart Cath and Coronary Angiography;  Surgeon: Corey Skains, MD;  Location: [place] [facility];  Service: Cardiovascular;  Laterality: Bilateral;  . COLONOSCOPY    . COLONOSCOPY N/A 07/19/2015   Procedure: COLONOSCOPY;  Surgeon: Robert Bellow, MD;  Location: [facility] ENDOSCOPY;  Service: Endoscopy;  Laterality: N/A;  . CYSTOSCOPY WITH STENT PLACEMENT Left 01/05/2020   Procedure: CYSTOSCOPY WITH STENT PLACEMENT;  Surgeon: Abbie Sons, MD;  Location: [facility] ORS;  Service: Urology;  Laterality: Left;  .  CYSTOSCOPY/URETEROSCOPY/HOLMIUM LASER/STENT PLACEMENT Left 01/26/2020   Procedure: CYSTOSCOPY/URETEROSCOPY/HOLMIUM LASER/STENT Exchange;  Surgeon: Abbie Sons, MD;  Location: [facility] ORS;  Service: Urology;  Laterality: Left;  . FLEXIBLE BRONCHOSCOPY N/A 07/27/2016   Procedure: FLEXIBLE BRONCHOSCOPY;  Surgeon: Allyne Gee, MD;  Location: [facility] ORS;  Service: Pulmonary;  Laterality: N/A;  . FOOT SURGERY    . JOINT REPLACEMENT Left    hip  . KIDNEY STONE SURGERY  7 years  . TOTAL HIP ARTHROPLASTY Left 12/02/2018   Procedure: TOTAL HIP ARTHROPLASTY ANTERIOR APPROACH;  Surgeon: Renette Butters, MD;  Location: [facility] ORS;  Service: Orthopedics;  Laterality: Left;  . TUBAL LIGATION      Prior to Admission medications   Medication Sig Start Date End Date Taking? Authorizing Provider  albuterol (VENTOLIN HFA) 108 (90 Base) MCG/ACT inhaler Inhale 2 puffs into the lungs every 4 (four) hours as needed for wheezing or shortness of breath. 01/22/20   Dunn, Areta Haber, PA-C  aspirin EC 81 MG tablet Take 1 tablet (81 mg total) by mouth 2 (two) times daily. For DVT prophylaxis for 30 days after surgery. Patient taking differently: Take 81 mg by mouth daily.  12/02/18   Prudencio Burly III, PA-C  ciprofloxacin (CIPRO) 500 MG tablet Take 1 tablet (500 mg total) by mouth every 12 (twelve) hours. 01/25/20   Zara Council A, PA-C  fluticasone (FLONASE) 50 MCG/ACT nasal spray Place 2 sprays into both nostrils daily. Patient taking differently: Place 2 sprays into both nostrils daily as needed for allergies.  02/16/19   Caryn Section Linden Dolin, PA-C  fluticasone (FLOVENT HFA) 110 MCG/ACT inhaler Inhale 2 puffs 2 x day for copd. Rinse mouth afterwards 01/22/20   Rise Mu, PA-C  furosemide (LASIX) 40 MG tablet Take 1 tablet (40 mg total) by mouth daily as needed for fluid. Patient taking differently: Take 40 mg by mouth daily as needed for fluid or edema.  01/06/19   Ronnell Freshwater, NP  HYDROcodone-acetaminophen  (NORCO/VICODIN) 5-325 MG tablet Take 1 tablet by mouth every 6 (six) hours as needed for moderate pain. Patient not taking: Reported on 02/05/2020 01/21/20   Zara Council A, PA-C  meloxicam (MOBIC) 15 MG tablet Take 1 tablet (15 mg total) by mouth daily. 11/02/19   Ronnell Freshwater, NP  mometasone-formoterol (DULERA) 100-5 MCG/ACT AERO Inhale 2 puffs into the lungs daily as needed for wheezing or shortness of breath. 03/10/19   Vaughan Basta, MD  neomycin-polymyxin-hydrocortisone (CORTISPORIN) 3.5-10000-1 ophthalmic suspension Insert four drops in both ears twice daily for seven days. Patient taking differently: 4 drops See admin instructions. Insert four drops in both ears twice daily for seven days as needed for ear pain 08/27/19   Ronnell Freshwater, NP  ondansetron (ZOFRAN ODT) 4 MG disintegrating tablet Take 1 tablet (4 mg total) by mouth every 8 (eight) hours as needed for nausea or vomiting. 02/24/20   Harvest Dark, MD  ondansetron [facility]) 4  MG tablet Take 1 tablet (4 mg total) by mouth every 8 (eight) hours as needed for nausea or vomiting. Patient not taking: Reported on 02/05/2020 01/12/20   Riki Altes, MD  oxybutynin (DITROPAN) 5 MG tablet Take 1 tablet (5 mg total) by mouth 3 (three) times daily. Patient not taking: Reported on 02/05/2020 01/06/20   Michiel Cowboy A, PA-C  pantoprazole (PROTONIX) 40 MG tablet Take 1 tablet (40 mg total) by mouth daily. 02/24/20 02/23/21  Minna Antis, MD  tamsulosin (FLOMAX) 0.4 MG CAPS capsule Take 1 capsule (0.4 mg total) by mouth daily. Patient not taking: Reported on 02/05/2020 01/06/20   Michiel Cowboy A, PA-C  triamcinolone (KENALOG) 0.025 % cream Apply 1 application topically 2 (two) times daily. Patient taking differently: Apply 1 application topically 3 (three) times daily.  08/27/19   Carlean Jews, NP  venlafaxine XR (EFFEXOR-XR) 75 MG 24 hr capsule Take 2 capsules (150 mg total) by mouth daily with breakfast. 11/02/19    Carlean Jews, NP    Allergies Bee venom, Ciprofloxacin, and Penicillins  Family History  Problem Relation Age of Onset  . Hypertension Mother   . Arthritis Mother   . Heart disease Mother   . Heart failure Mother   . Hypertension Father   . Arthritis Father   . Prostate cancer Father   . Heart disease Father   . Heart attack Father   . Arrhythmia Father 51       pacemaker implant  . Ovarian cancer Maternal Grandmother     Social History Social History   Tobacco Use  . Smoking status: Current Every Day Smoker    Packs/day: 0.50    Years: 14.00    Pack years: 7.00    Types: Cigarettes    Last attempt to quit: 05/07/2016    Years since quitting: 4.0  . Smokeless tobacco: Never Used  . Tobacco comment: 11-25-18 reports  down to just 1 cigarette per day   Substance Use Topics  . Alcohol use: Yes    Alcohol/week: 0.0 standard drinks    Comment: seldom   . Drug use: No    Review of Systems Constitutional: No fever/chills Eyes: No visual changes. ENT: No sore throat. Cardiovascular: Positive for chest pain. Respiratory: Denies shortness of breath. Gastrointestinal: No abdominal pain.  No nausea, no vomiting.  No diarrhea.   Genitourinary: Negative for dysuria. Musculoskeletal: Positive for left arm pain. Skin: Negative for rash. Neurological: Negative for headaches, focal weakness or numbness.  ____________________________________________   PHYSICAL EXAM:  VITAL SIGNS: ED Triage Vitals  Enc Vitals Group     BP 05/10/20 1543 (!) 160/64     Pulse Rate 05/10/20 1543 67     Resp 05/10/20 1543 20     Temp 05/10/20 1543 98.5 F (36.9 C)     Temp Source 05/10/20 1543 Oral     SpO2 05/10/20 1543 100 %     Weight 05/10/20 1550 232 lb (105.2 kg)     Height 05/10/20 1550 5\' 7"  (1.702 m)     Head Circumference --      Peak Flow --      Pain Score 05/10/20 1549 7   Constitutional: Alert and oriented.  Eyes: Conjunctivae are normal.  ENT      Head:  Normocephalic and atraumatic.      Nose: No congestion/rhinnorhea.      Mouth/Throat: Mucous membranes are moist.      Neck: No stridor. Hematological/Lymphatic/Immunilogical: No cervical lymphadenopathy.  Cardiovascular: Normal rate, regular rhythm.  No murmurs, rubs, or gallops.  Respiratory: Normal respiratory effort without tachypnea nor retractions. Breath sounds are clear and equal bilaterally. No wheezes/rales/rhonchi. Gastrointestinal: Soft and non tender. No rebound. No guarding.  Genitourinary: Deferred Musculoskeletal: Normal range of motion in all extremities. No lower extremity edema. Neurologic:  Normal speech and language. No gross focal neurologic deficits are appreciated.  Skin:  Skin is warm, dry and intact. No rash noted. Psychiatric: Mood and affect are normal. Speech and behavior are normal. Patient exhibits appropriate insight and judgment.  ____________________________________________    LABS (pertinent positives/negatives)  Trop hs 5 > 6 CBC wbc 5.2, hgb 13.5, plt 248 BMP wnl except ca 8.7   ____________________________________________   EKG  I, Phineas Semen, attending physician, personally viewed and interpreted this EKG  EKG Time: 1539 Rate: 67 Rhythm: normal sinus rhythm Axis: left axis deviation Intervals: qtc 462 QRS: RBBB, LAFB ST changes: no st elevation Impression: abnormal ekg  ____________________________________________    RADIOLOGY  CXR Atelectasis vs scarring in right lung  ____________________________________________   PROCEDURES  Procedures  ____________________________________________   INITIAL IMPRESSION / ASSESSMENT AND PLAN / ED COURSE  Pertinent labs & imaging results that were available during my care of the patient were reviewed by me and considered in my medical decision making (see chart for details).   Patient presented to the emergency department today because of concerns for chest pain.  Differential  would be broad including ACS, pneumonia, pneumothorax, esophagitis, GERD, costochondritis amongst other etiologies.  Patient does state that the pain is very intermittent.  No significant associated shortness of breath.  At this point I doubt PE or dissection.  Troponin was negative x2.  Did discuss with patient that there is is no signs of current heart damage however did recommend patient follow-up with her cardiologist.  She does have history of GERD but states this does feel somewhat different.  Patient was pain-free during my evaluation. Do think it is reasonable for patient to be discharged.  ____________________________________________   FINAL CLINICAL IMPRESSION(S) / ED DIAGNOSES  Final diagnoses:  Nonspecific chest pain     Note: This dictation was prepared with Dragon dictation. Any transcriptional errors that result from this process are unintentional     Phineas Semen, MD 05/10/20 [phone]

## 2020-05-10 NOTE — ED Triage Notes (Signed)
Pt presents to ED via [facility] with c/o L sided CP that radiates to her back and to her L arm. Pt states has been ongoing x several days, pt states is supposed to be on 2L chronic O2 but she does not normally wear O2. Pt A&O x4, NAD noted at this time.

## 2020-05-10 NOTE — Discharge Instructions (Addendum)
Please seek medical attention for any high fevers, chest pain, shortness of breath, change in behavior, persistent vomiting, bloody stool or any other new or concerning symptoms.  

## 2020-05-10 NOTE — ED Notes (Signed)
Pt denies chest pain and other symptoms at this time. Pt A&O x4.

## 2020-05-13 ENCOUNTER — Encounter: Payer: Self-pay | Admitting: Cardiology

## 2020-05-13 ENCOUNTER — Other Ambulatory Visit: Payer: Self-pay

## 2020-05-13 ENCOUNTER — Ambulatory Visit (INDEPENDENT_AMBULATORY_CARE_PROVIDER_SITE_OTHER): Payer: Medicare Other | Admitting: Cardiology

## 2020-05-13 VITALS — BP 137/78 | HR 64 | Ht 67.0 in | Wt 232.0 lb

## 2020-05-13 DIAGNOSIS — D860 Sarcoidosis of lung: Secondary | ICD-10-CM

## 2020-05-13 DIAGNOSIS — F17200 Nicotine dependence, unspecified, uncomplicated: Secondary | ICD-10-CM | POA: Diagnosis not present

## 2020-05-13 DIAGNOSIS — R072 Precordial pain: Secondary | ICD-10-CM

## 2020-05-13 DIAGNOSIS — E7800 Pure hypercholesterolemia, unspecified: Secondary | ICD-10-CM

## 2020-05-13 NOTE — Patient Instructions (Addendum)
Medication Instructions:  No changes. *If you need a refill on your cardiac medications before your next appointment, please call your pharmacy*   Lab Work: None Ordered. If you have labs (blood work) drawn today and your tests are completely normal, you will receive your results only by: Marland Kitchen MyChart Message (if you have MyChart) OR . A paper copy in the mail If you have any lab test that is abnormal or we need to change your treatment, we will call you to review the results.   Testing/Procedures: Your physician has requested that you have a lexiscan myoview.  [facility] MYOVIEW  Your caregiver has ordered a Stress Test with nuclear imaging. The purpose of this test is to evaluate the blood supply to your heart muscle. This procedure is referred to as a "Non-Invasive Stress Test." This is because other than having an IV started in your vein, nothing is inserted or "invades" your body. Cardiac stress tests are done to find areas of poor blood flow to the heart by determining the extent of coronary artery disease (CAD). Some patients exercise on a treadmill, which naturally increases the blood flow to your heart, while others who are  unable to walk on a treadmill due to physical limitations have a pharmacologic/chemical stress agent called Lexiscan . This medicine will mimic walking on a treadmill by temporarily increasing your coronary blood flow.   Please note: these test may take anywhere between 2-4 hours to complete  PLEASE REPORT TO [facility] MEDICAL MALL ENTRANCE  THE VOLUNTEERS AT THE FIRST DESK WILL DIRECT YOU WHERE TO GO  Date of Procedure:_____________________________________  Arrival Time for Procedure:______________________________  Instructions regarding medication:   __X__ : Continue with your regular medications the morning of your test.   PLEASE NOTIFY THE OFFICE AT LEAST 24 HOURS IN ADVANCE IF YOU ARE UNABLE TO KEEP YOUR APPOINTMENT.  [phone] AND  PLEASE NOTIFY NUCLEAR  MEDICINE AT [facility] AT LEAST 24 HOURS IN ADVANCE IF YOU ARE UNABLE TO KEEP YOUR APPOINTMENT. [phone]  How to prepare for your Myoview test:  1. Do not eat or drink after midnight 2. No caffeine for 24 hours prior to test 3. No smoking 24 hours prior to test. 4. Your medication may be taken with water.  If your doctor stopped a medication because of this test, do not take that medication. 5. Ladies, please do not wear dresses.  Skirts or pants are appropriate. Please wear a short sleeve shirt. 6. No perfume, cologne or lotion. 7. Wear comfortable walking shoes. No heels!     Follow-Up: At [facility], you and your health needs are our priority.  As part of our continuing mission to provide you with exceptional heart care, we have created designated Provider Care Teams.  These Care Teams include your primary Cardiologist (physician) and Advanced Practice Providers (APPs -  Physician Assistants and Nurse Practitioners) who all work together to provide you with the care you need, when you need it.  We recommend signing up for the patient portal called "MyChart".  Sign up information is provided on this After Visit Summary.  MyChart is used to connect with patients for Virtual Visits (Telemedicine).  Patients are able to view lab/test results, encounter notes, upcoming appointments, etc.  Non-urgent messages can be sent to your provider as well.   To learn more about what you can do with MyChart, go to [email or web address].    Your next appointment:   After Scheduled Test.  The format for your next appointment:  In Person  Provider:   Kate Sable, MD   Other Instructions  You have been referred to [facility] Pulmonary, they will contact you to schedule your appointment).  Cardiac Nuclear Scan A cardiac nuclear scan is a test that measures blood flow to the heart when a person is resting and when he or she is exercising. The test looks for problems such as:  Not enough  blood reaching a portion of the heart.  The heart muscle not working normally. You may need this test if:  You have heart disease.  You have had abnormal lab results.  You have had heart surgery or a balloon procedure to open up blocked arteries (angioplasty).  You have chest pain.  You have shortness of breath. In this test, a radioactive dye (tracer) is injected into your bloodstream. After the tracer has traveled to your heart, an imaging device is used to measure how much of the tracer is absorbed by or distributed to various areas of your heart. This procedure is usually done at a hospital and takes 2-4 hours. Tell a health care provider about:  Any allergies you have.  All medicines you are taking, including vitamins, herbs, eye drops, creams, and over-the-counter medicines.  Any problems you or family members have had with anesthetic medicines.  Any blood disorders you have.  Any surgeries you have had.  Any medical conditions you have.  Whether you are pregnant or may be pregnant. What are the risks? Generally, this is a safe procedure. However, problems may occur, including:  Serious chest pain and heart attack. This is only a risk if the stress portion of the test is done.  Rapid heartbeat.  Sensation of warmth in your chest. This usually passes quickly.  Allergic reaction to the tracer. What happens before the procedure?  Ask your health care provider about changing or stopping your regular medicines. This is especially important if you are taking diabetes medicines or blood thinners.  Follow instructions from your health care provider about eating or drinking restrictions.  Remove your jewelry on the day of the procedure. What happens during the procedure?  An IV will be inserted into one of your veins.  Your health care provider will inject a small amount of radioactive tracer through the IV.  You will wait for 20-40 minutes while the tracer travels  through your bloodstream.  Your heart activity will be monitored with an electrocardiogram (ECG).  You will lie down on an exam table.  Images of your heart will be taken for about 15-20 minutes.  You may also have a stress test. For this test, one of the following may be done: ? You will exercise on a treadmill or stationary bike. While you exercise, your heart's activity will be monitored with an ECG, and your blood pressure will be checked. ? You will be given medicines that will increase blood flow to parts of your heart. This is done if you are unable to exercise.  When blood flow to your heart has peaked, a tracer will again be injected through the IV.  After 20-40 minutes, you will get back on the exam table and have more images taken of your heart.  Depending on the type of tracer used, scans may need to be repeated 3-4 hours later.  Your IV line will be removed when the procedure is over. The procedure may vary among health care providers and hospitals. What happens after the procedure?  Unless your health care provider tells you  otherwise, you may return to your normal schedule, including diet, activities, and medicines.  Unless your health care provider tells you otherwise, you may increase your fluid intake. This will help to flush the contrast dye from your body. Drink enough fluid to keep your urine pale yellow.  Ask your health care provider, or the department that is doing the test: ? When will my results be ready? ? How will I get my results? Summary  A cardiac nuclear scan measures the blood flow to the heart when a person is resting and when he or she is exercising.  Tell your health care provider if you are pregnant.  Before the procedure, ask your health care provider about changing or stopping your regular medicines. This is especially important if you are taking diabetes medicines or blood thinners.  After the procedure, unless your health care provider  tells you otherwise, increase your fluid intake. This will help flush the contrast dye from your body.  After the procedure, unless your health care provider tells you otherwise, you may return to your normal schedule, including diet, activities, and medicines. This information is not intended to replace advice given to you by your health care provider. Make sure you discuss any questions you have with your health care provider. Document Revised: 05/26/2018 Document Reviewed: 05/26/2018 Elsevier Patient Education  2020 [employer].

## 2020-05-13 NOTE — Progress Notes (Signed)
Cardiology Office Note:    Date:  05/13/2020   ID:  JANARIA Wyatt, DOB Dec 31, 1965, MRN [id number]  PCP:  [facility]  Cardiologist:  Debbe Odea, MD  Electrophysiologist:  None   Referring MD: [facility]*   Chief Complaint  Patient presents with  . Other    ED follow up for chest pain. Meds reviewed verbally with patient.     History of Present Illness:    Nicole Wyatt is a 54 y.o. female with a hx of COPD, anxiety, hyperlipidemia, pulmonary sarcoid, current smoker x30+ years who presents due to chest pain.  Patient states having symptoms of left-sided chest pain for the past 2 weeks.  Symptoms sometimes occur with exertion and sometimes at rest.  She rates symptoms as 8 out of 10 in severity lasting a few seconds when it occurs.  She describes symptoms as pressure-like and sometimes associated with left arm tingling.  She went to primary care's office 3 days ago to pick up prescription for elevated cholesterol.  She had symptoms of chest pain at the PCPs office and EMS was called and patient taken to the emergency room.  In the ED, work-up with EKG, troponins was unrevealing.  Her symptoms resolved and she was subsequently discharged.  Echocardiogram on 12/2019 showed normal systolic function, EF 60 to 65%, impaired relaxation, mild LVH, mild MR.  Left heart cath in 05/2016 showed normal coronary arteries.  She used to see a pulmonologist in [facility] for pulmonary sarcoid but the pulmonologist moved her practice.  She wants a referral for a local pulmonary physician.  Past Medical History:  Diagnosis Date  . Abnormal Pap smear of cervix   . Anxiety   . Arthritis   . Bipolar 1 disorder (HCC)   . COPD (chronic obstructive pulmonary disease) (HCC)   . Depression   . Dyspnea    with exertion   . History of kidney stones   . Hyperlipidemia   . Oxygen dependent    patient uses 2L oz PRN ; reports hardly ever uses it unless very SOB    .  [place] spotted fever 2018  . Sarcoidosis of lung (HCC)    managed by Dr Lindie Spruce   . Seizures (HCC) last seizure 05/2013  . Sleep apnea    uses CPAP nightly   . UTI (urinary tract infection) 11/13/2018   see ED visit in epic ; was dc'd with oral abx and reports completed and relief of sx     Past Surgical History:  Procedure Laterality Date  . ABDOMINAL HYSTERECTOMY  1999  . BLADDER SURGERY    . CARDIAC CATHETERIZATION Bilateral 05/29/2016   Procedure: Right/Left Heart Cath and Coronary Angiography;  Surgeon: Lamar Blinks, MD;  Location: [facility] INVASIVE CV LAB;  Service: Cardiovascular;  Laterality: Bilateral;  . COLONOSCOPY    . COLONOSCOPY N/A 07/19/2015   Procedure: COLONOSCOPY;  Surgeon: Earline Mayotte, MD;  Location: [facility] ENDOSCOPY;  Service: Endoscopy;  Laterality: N/A;  . CYSTOSCOPY WITH STENT PLACEMENT Left 01/05/2020   Procedure: CYSTOSCOPY WITH STENT PLACEMENT;  Surgeon: Riki Altes, MD;  Location: [facility] ORS;  Service: Urology;  Laterality: Left;  . CYSTOSCOPY/URETEROSCOPY/HOLMIUM LASER/STENT PLACEMENT Left 01/26/2020   Procedure: CYSTOSCOPY/URETEROSCOPY/HOLMIUM LASER/STENT Exchange;  Surgeon: Riki Altes, MD;  Location: [facility] ORS;  Service: Urology;  Laterality: Left;  . FLEXIBLE BRONCHOSCOPY N/A 07/27/2016   Procedure: FLEXIBLE BRONCHOSCOPY;  Surgeon: Yevonne Pax, MD;  Location: [facility] ORS;  Service: Pulmonary;  Laterality: N/A;  . FOOT SURGERY    . JOINT REPLACEMENT Left    hip  . KIDNEY STONE SURGERY  7 years  . TOTAL HIP ARTHROPLASTY Left 12/02/2018   Procedure: TOTAL HIP ARTHROPLASTY ANTERIOR APPROACH;  Surgeon: Sheral Apley, MD;  Location: [facility] ORS;  Service: Orthopedics;  Laterality: Left;  . TUBAL LIGATION      Current Medications: Current Meds  Medication Sig  . albuterol (VENTOLIN HFA) 108 (90 Base) MCG/ACT inhaler Inhale 2 puffs into the lungs every 4 (four) hours as needed for wheezing or shortness of breath.  Marland Kitchen aspirin EC 81 MG tablet Take 1  tablet (81 mg total) by mouth 2 (two) times daily. For DVT prophylaxis for 30 days after surgery. (Patient taking differently: Take 81 mg by mouth daily. )  . atorvastatin (LIPITOR) 20 MG tablet Take 20 mg by mouth daily.  Marland Kitchen EPINEPHrine (EPIPEN 2-PAK) 0.3 mg/0.3 mL IJ SOAJ injection Inject 0.3 mg into the muscle as needed for anaphylaxis.  . famotidine (PEPCID) 20 MG tablet Take 20 mg by mouth 2 (two) times daily.  . fluticasone (FLONASE) 50 MCG/ACT nasal spray Place 2 sprays into both nostrils daily. (Patient taking differently: Place 2 sprays into both nostrils daily as needed for allergies. )  . fluticasone (FLOVENT HFA) 110 MCG/ACT inhaler Inhale 2 puffs 2 x day for copd. Rinse mouth afterwards  . furosemide (LASIX) 40 MG tablet Take 1 tablet (40 mg total) by mouth daily as needed for fluid. (Patient taking differently: Take 40 mg by mouth daily as needed for fluid or edema. )  . meloxicam (MOBIC) 15 MG tablet Take 1 tablet (15 mg total) by mouth daily.  . mometasone-formoterol (DULERA) 100-5 MCG/ACT AERO Inhale 2 puffs into the lungs daily as needed for wheezing or shortness of breath.  . venlafaxine XR (EFFEXOR-XR) 75 MG 24 hr capsule Take 2 capsules (150 mg total) by mouth daily with breakfast.      Allergies:   Bee venom, Ciprofloxacin, and Penicillins   Social History   Socioeconomic History  . Marital status: Single    Spouse name: Not on file  . Number of children: Not on file  . Years of education: Not on file  . Highest education level: Not on file  Occupational History  . Not on file  Tobacco Use  . Smoking status: Current Every Day Smoker    Packs/day: 0.50    Years: 14.00    Pack years: 7.00    Types: Cigarettes    Last attempt to quit: 05/07/2016    Years since quitting: 4.0  . Smokeless tobacco: Never Used  . Tobacco comment: 11-25-18 reports  down to just 1 cigarette per day   Substance and Sexual Activity  . Alcohol use: Yes    Alcohol/week: 0.0 standard drinks      Comment: seldom   . Drug use: No  . Sexual activity: Never  Other Topics Concern  . Not on file  Social History Narrative  . Not on file   Social Determinants of Health   Financial Resource Strain:   . Difficulty of Paying Living Expenses:   Food Insecurity:   . Worried About Programme researcher, broadcasting/film/video in the Last Year:   . Barista in the Last Year:   Transportation Needs:   . Freight forwarder (Medical):   Marland Kitchen Lack of Transportation (Non-Medical):   Physical Activity:   . Days of Exercise per Week:   . Minutes  of Exercise per Session:   Stress:   . Feeling of Stress :   Social Connections:   . Frequency of Communication with Friends and Family:   . Frequency of Social Gatherings with Friends and Family:   . Attends Religious Services:   . Active Member of Clubs or Organizations:   . Attends Banker Meetings:   Marland Kitchen Marital Status:      Family History: The patient's family history includes Arrhythmia (age of onset: 71) in her father; Arthritis in her father and mother; Heart attack in her father; Heart disease in her father and mother; Heart failure in her mother; Hypertension in her father and mother; Ovarian cancer in her maternal grandmother; Prostate cancer in her father.  ROS:   Please see the history of present illness.     All other systems reviewed and are negative.  EKGs/Labs/Other Studies Reviewed:    The following studies were reviewed today:   EKG:  EKG is  ordered today.  The ekg ordered today demonstrates sinus rhythm, right bundle branch block, left anterior fascicular block.  Recent Labs: 07/20/2019: TSH 2.040 02/24/2020: ALT 17 05/10/2020: BUN 18; Creatinine, Ser 0.75; Hemoglobin 13.5; Platelets 248; Potassium 3.6; Sodium 139  Recent Lipid Panel    Component Value Date/Time   CHOL 164 03/08/2019 0357   CHOL 177 07/02/2018 1309   CHOL 194 11/29/2013 0504   TRIG 136 03/08/2019 0357   TRIG 61 11/29/2013 0504   HDL 36 (L) 03/08/2019  0357   HDL 51 07/02/2018 1309   HDL 43 11/29/2013 0504   CHOLHDL 4.6 03/08/2019 0357   VLDL 27 03/08/2019 0357   VLDL 12 11/29/2013 0504   LDLCALC 101 (H) 03/08/2019 0357   LDLCALC 113 (H) 07/02/2018 1309   LDLCALC 139 (H) 11/29/2013 0504    Physical Exam:    VS:  BP 137/78 (BP Location: Left Arm, Patient Position: Sitting, Cuff Size: Normal)   Pulse 64   Ht  (1.702 m)   Wt 232 lb (105.2 kg)   SpO2 98%   BMI 36.34 kg/m     Wt Readings from Last 3 Encounters:  05/13/20 232 lb (105.2 kg)  05/10/20 232 lb (105.2 kg)  02/24/20 235 lb 14.3 oz (107 kg)     GEN:  Well nourished, well developed in no acute distress HEENT: Normal NECK: No JVD; No carotid bruits LYMPHATICS: No lymphadenopathy CARDIAC: RRR, 1/6 systolic murmur.  RESPIRATORY:  Clear to auscultation without rales, wheezing or rhonchi  ABDOMEN: Soft, non-tender, non-distended MUSCULOSKELETAL:  No edema; No deformity  SKIN: Warm and dry NEUROLOGIC:  Alert and oriented x 3 PSYCHIATRIC:  Normal affect   ASSESSMENT:    1. Precordial pain   2. Pure hypercholesterolemia   3. Smoking   4. Pulmonary sarcoidosis (HCC)    PLAN:    In order of problems listed above:  1. Patient with atypical chest pain.  She has risk factors of hyperlipidemia, current smoker.  Last echocardiogram showed normal systolic function with impaired relaxation.  Left heart cath 4 years ago with no CAD.  Will evaluate patient with a Myoview for any ischemia. 2. History of hyperlipidemia, continue statin. 3. Patient is a current smoker, smoking cessation advised.  4. Patient has a history of pulmonary sarcoid and sleep apnea.  Will refer to pulmonary medicine regarding management.  Follow-up after stress testing.  This note was generated in part or whole with voice recognition software. Voice recognition is usually quite accurate but there  are transcription errors that can and very often do occur. I apologize for any typographical errors  that were not detected and corrected.  Medication Adjustments/Labs and Tests Ordered: Current medicines are reviewed at length with the patient today.  Concerns regarding medicines are outlined above.  Orders Placed This Encounter  Procedures  . NM Myocar Multi W/Spect W/Wall Motion / EF  . Ambulatory referral to Pulmonology  . EKG 12-Lead   No orders of the defined types were placed in this encounter.   Patient Instructions  Medication Instructions:  No changes. *If you need a refill on your cardiac medications before your next appointment, please call your pharmacy*   Lab Work: None Ordered. If you have labs (blood work) drawn today and your tests are completely normal, you will receive your results only by: Marland Kitchen MyChart Message (if you have MyChart) OR . A paper copy in the mail If you have any lab test that is abnormal or we need to change your treatment, we will call you to review the results.   Testing/Procedures: Your physician has requested that you have a lexiscan myoview.  [facility] MYOVIEW  Your caregiver has ordered a Stress Test with nuclear imaging. The purpose of this test is to evaluate the blood supply to your heart muscle. This procedure is referred to as a "Non-Invasive Stress Test." This is because other than having an IV started in your vein, nothing is inserted or "invades" your body. Cardiac stress tests are done to find areas of poor blood flow to the heart by determining the extent of coronary artery disease (CAD). Some patients exercise on a treadmill, which naturally increases the blood flow to your heart, while others who are  unable to walk on a treadmill due to physical limitations have a pharmacologic/chemical stress agent called Lexiscan . This medicine will mimic walking on a treadmill by temporarily increasing your coronary blood flow.   Please note: these test may take anywhere between 2-4 hours to complete  PLEASE REPORT TO [facility] MEDICAL [facility] ENTRANCE    THE VOLUNTEERS AT THE FIRST DESK WILL DIRECT YOU WHERE TO GO  Date of Procedure:_____________________________________  Arrival Time for Procedure:______________________________  Instructions regarding medication:   __X__ : Continue with your regular medications the morning of your test.   PLEASE NOTIFY THE OFFICE AT LEAST 24 HOURS IN ADVANCE IF YOU ARE UNABLE TO KEEP YOUR APPOINTMENT.  [phone] AND  PLEASE NOTIFY NUCLEAR MEDICINE AT [facility] AT LEAST 24 HOURS IN ADVANCE IF YOU ARE UNABLE TO KEEP YOUR APPOINTMENT. [phone]  How to prepare for your Myoview test:  1. Do not eat or drink after midnight 2. No caffeine for 24 hours prior to test 3. No smoking 24 hours prior to test. 4. Your medication may be taken with water.  If your doctor stopped a medication because of this test, do not take that medication. 5. Ladies, please do not wear dresses.  Skirts or pants are appropriate. Please wear a short sleeve shirt. 6. No perfume, cologne or lotion. 7. Wear comfortable walking shoes. No heels!     Follow-Up: At [facility], you and your health needs are our priority.  As part of our continuing mission to provide you with exceptional heart care, we have created designated Provider Care Teams.  These Care Teams include your primary Cardiologist (physician) and Advanced Practice Providers (APPs -  Physician Assistants and Nurse Practitioners) who all work together to provide you with the care you need, when you need it.  We recommend signing up for the patient portal called "MyChart".  Sign up information is provided on this After Visit Summary.  MyChart is used to connect with patients for Virtual Visits (Telemedicine).  Patients are able to view lab/test results, encounter notes, upcoming appointments, etc.  Non-urgent messages can be sent to your provider as well.   To learn more about what you can do with MyChart, go to [email or web address].    Your next appointment:    After Scheduled Test.  The format for your next appointment:   In Person  Provider:   Kate Sable, MD   Other Instructions  You have been referred to [facility], they will contact you to schedule your appointment).  Cardiac Nuclear Scan A cardiac nuclear scan is a test that measures blood flow to the heart when a person is resting and when he or she is exercising. The test looks for problems such as:  Not enough blood reaching a portion of the heart.  The heart muscle not working normally. You may need this test if:  You have heart disease.  You have had abnormal lab results.  You have had heart surgery or a balloon procedure to open up blocked arteries (angioplasty).  You have chest pain.  You have shortness of breath. In this test, a radioactive dye (tracer) is injected into your bloodstream. After the tracer has traveled to your heart, an imaging device is used to measure how much of the tracer is absorbed by or distributed to various areas of your heart. This procedure is usually done at a hospital and takes 2-4 hours. Tell a health care provider about:  Any allergies you have.  All medicines you are taking, including vitamins, herbs, eye drops, creams, and over-the-counter medicines.  Any problems you or family members have had with anesthetic medicines.  Any blood disorders you have.  Any surgeries you have had.  Any medical conditions you have.  Whether you are pregnant or may be pregnant. What are the risks? Generally, this is a safe procedure. However, problems may occur, including:  Serious chest pain and heart attack. This is only a risk if the stress portion of the test is done.  Rapid heartbeat.  Sensation of warmth in your chest. This usually passes quickly.  Allergic reaction to the tracer. What happens before the procedure?  Ask your health care provider about changing or stopping your regular medicines. This is especially  important if you are taking diabetes medicines or blood thinners.  Follow instructions from your health care provider about eating or drinking restrictions.  Remove your jewelry on the day of the procedure. What happens during the procedure?  An IV will be inserted into one of your veins.  Your health care provider will inject a small amount of radioactive tracer through the IV.  You will wait for 20-40 minutes while the tracer travels through your bloodstream.  Your heart activity will be monitored with an electrocardiogram (ECG).  You will lie down on an exam table.  Images of your heart will be taken for about 15-20 minutes.  You may also have a stress test. For this test, one of the following may be done: ? You will exercise on a treadmill or stationary bike. While you exercise, your heart's activity will be monitored with an ECG, and your blood pressure will be checked. ? You will be given medicines that will increase blood flow to parts of your heart. This is done if you are unable  to exercise.  When blood flow to your heart has peaked, a tracer will again be injected through the IV.  After 20-40 minutes, you will get back on the exam table and have more images taken of your heart.  Depending on the type of tracer used, scans may need to be repeated 3-4 hours later.  Your IV line will be removed when the procedure is over. The procedure may vary among health care providers and hospitals. What happens after the procedure?  Unless your health care provider tells you otherwise, you may return to your normal schedule, including diet, activities, and medicines.  Unless your health care provider tells you otherwise, you may increase your fluid intake. This will help to flush the contrast dye from your body. Drink enough fluid to keep your urine pale yellow.  Ask your health care provider, or the department that is doing the test: ? When will my results be ready? ? How will I  get my results? Summary  A cardiac nuclear scan measures the blood flow to the heart when a person is resting and when he or she is exercising.  Tell your health care provider if you are pregnant.  Before the procedure, ask your health care provider about changing or stopping your regular medicines. This is especially important if you are taking diabetes medicines or blood thinners.  After the procedure, unless your health care provider tells you otherwise, increase your fluid intake. This will help flush the contrast dye from your body.  After the procedure, unless your health care provider tells you otherwise, you may return to your normal schedule, including diet, activities, and medicines. This information is not intended to replace advice given to you by your health care provider. Make sure you discuss any questions you have with your health care provider. Document Revised: 05/26/2018 Document Reviewed: 05/26/2018 Elsevier Patient Education  2020 [employer]      Signed, Debbe OdeaBrian Agbor-Etang, MD  05/13/2020 5:02 PM    [facility]

## 2020-05-20 ENCOUNTER — Other Ambulatory Visit: Payer: Self-pay

## 2020-05-20 ENCOUNTER — Encounter
Admission: RE | Admit: 2020-05-20 | Discharge: 2020-05-20 | Disposition: A | Discharge status: Home / Self Care | Payer: Medicare Other | Source: Ambulatory Visit | Attending: Cardiology | Admitting: Cardiology

## 2020-05-20 DIAGNOSIS — R072 Precordial pain: Secondary | ICD-10-CM | POA: Insufficient documentation

## 2020-05-20 LAB — NM MYOCAR MULTI W/SPECT W/WALL MOTION / EF
Estimated workload: 1 METS
Exercise duration (min): 0 min
Exercise duration (sec): 0 s
LV dias vol: 119 mL (ref 46–106)
LV sys vol: 42 mL
MPHR: 165 {beats}/min
Peak HR: 93 {beats}/min
Percent HR: 56 %
Rest HR: 57 {beats}/min
SDS: 6
SRS: 16
SSS: 10
TID: 0.95

## 2020-05-20 MED ORDER — TECHNETIUM TC 99M TETROFOSMIN IV KIT
10.0000 | PACK | Freq: Once | INTRAVENOUS | Status: AC | PRN
  Administered 2020-05-20: 10 via INTRAVENOUS

## 2020-05-20 MED ORDER — TECHNETIUM TC 99M TETROFOSMIN IV KIT
32.1700 | PACK | Freq: Once | INTRAVENOUS | Status: AC | PRN
  Administered 2020-05-20: 32.17 via INTRAVENOUS

## 2020-05-20 MED ORDER — REGADENOSON 0.4 MG/5ML IV SOLN
0.4000 mg | Freq: Once | INTRAVENOUS | Status: AC
  Administered 2020-05-20: 0.4 mg via INTRAVENOUS

## 2020-05-25 ENCOUNTER — Encounter: Payer: Self-pay | Admitting: Emergency Medicine

## 2020-05-25 ENCOUNTER — Emergency Department
Admission: EM | Admit: 2020-05-25 | Discharge: 2020-05-25 | Disposition: A | Discharge status: Home / Self Care | Payer: Medicare Other | Attending: Student in an Organized Health Care Education/Training Program | Admitting: Student in an Organized Health Care Education/Training Program

## 2020-05-25 ENCOUNTER — Other Ambulatory Visit: Payer: Self-pay

## 2020-05-25 ENCOUNTER — Emergency Department: Payer: Medicare Other

## 2020-05-25 DIAGNOSIS — F17210 Nicotine dependence, cigarettes, uncomplicated: Secondary | ICD-10-CM | POA: Diagnosis not present

## 2020-05-25 DIAGNOSIS — R1031 Right lower quadrant pain: Secondary | ICD-10-CM | POA: Diagnosis present

## 2020-05-25 DIAGNOSIS — Z87442 Personal history of urinary calculi: Secondary | ICD-10-CM | POA: Insufficient documentation

## 2020-05-25 DIAGNOSIS — R300 Dysuria: Secondary | ICD-10-CM | POA: Diagnosis not present

## 2020-05-25 LAB — URINALYSIS, COMPLETE (UACMP) WITH MICROSCOPIC
Bilirubin Urine: NEGATIVE
Glucose, UA: NEGATIVE mg/dL
Hgb urine dipstick: NEGATIVE
Ketones, ur: 5 mg/dL — AB
Nitrite: NEGATIVE
Protein, ur: NEGATIVE mg/dL
Specific Gravity, Urine: 1.014 (ref 1.005–1.030)
WBC, UA: >50 WBC/hpf — ABNORMAL HIGH (ref 0–5)
pH: 7 (ref 5.0–8.0)

## 2020-05-25 LAB — CBC
HCT: 43.7 % (ref 36.0–46.0)
Hemoglobin: 15 g/dL (ref 12.0–15.0)
MCH: 31.5 pg (ref 26.0–34.0)
MCHC: 34.3 g/dL (ref 30.0–36.0)
MCV: 91.8 fL (ref 80.0–100.0)
Platelets: 235 10*3/uL (ref 150–400)
RBC: 4.76 MIL/uL (ref 3.87–5.11)
RDW: 14.2 % (ref 11.5–15.5)
WBC: 4.8 10*3/uL (ref 4.0–10.5)
nRBC: 0 % (ref 0.0–0.2)

## 2020-05-25 LAB — COMPREHENSIVE METABOLIC PANEL
ALT: 15 U/L (ref 0–44)
AST: 18 U/L (ref 15–41)
Albumin: 4.4 g/dL (ref 3.5–5.0)
Alkaline Phosphatase: 80 U/L (ref 38–126)
Anion gap: 9 (ref 5–15)
BUN: 10 mg/dL (ref 6–20)
CO2: 26 mmol/L (ref 22–32)
Calcium: 9.1 mg/dL (ref 8.9–10.3)
Chloride: 106 mmol/L (ref 98–111)
Creatinine, Ser: 0.74 mg/dL (ref 0.44–1.00)
GFR calc Af Amer: >60 mL/min (ref >60)
GFR calc non Af Amer: >60 mL/min (ref >60)
Glucose, Bld: 95 mg/dL (ref 70–99)
Potassium: 4 mmol/L (ref 3.5–5.1)
Sodium: 141 mmol/L (ref 135–145)
Total Bilirubin: 0.9 mg/dL (ref 0.3–1.2)
Total Protein: 8.2 g/dL — ABNORMAL HIGH (ref 6.5–8.1)

## 2020-05-25 LAB — LIPASE, BLOOD: Lipase: 27 U/L (ref 11–51)

## 2020-05-25 MED ORDER — METOCLOPRAMIDE HCL 5 MG/ML IJ SOLN
10.0000 mg | Freq: Once | INTRAMUSCULAR | Status: AC
  Administered 2020-05-25: 10 mg via INTRAVENOUS
  Filled 2020-05-25: qty 2

## 2020-05-25 MED ORDER — IOHEXOL 350 MG/ML SOLN
100.0000 mL | Freq: Once | INTRAVENOUS | Status: AC | PRN
  Administered 2020-05-25: 100 mL via INTRAVENOUS
  Filled 2020-05-25: qty 100

## 2020-05-25 MED ORDER — CEPHALEXIN 500 MG PO CAPS
500.0000 mg | ORAL_CAPSULE | Freq: Once | ORAL | Status: AC
  Administered 2020-05-25: 500 mg via ORAL
  Filled 2020-05-25: qty 1

## 2020-05-25 MED ORDER — SODIUM CHLORIDE 0.9% FLUSH
3.0000 mL | Freq: Once | INTRAVENOUS | Status: AC
  Administered 2020-05-25: 3 mL via INTRAVENOUS

## 2020-05-25 MED ORDER — CEPHALEXIN 500 MG PO CAPS
500.0000 mg | ORAL_CAPSULE | Freq: Three times a day (TID) | ORAL | 0 refills | Status: AC
Start: 2020-05-25 — End: 2020-05-30

## 2020-05-25 MED ORDER — ONDANSETRON HCL 4 MG PO TABS: 4.0000 mg | ORAL_TABLET | Freq: Every day | ORAL | 0 refills | Status: DC | PRN

## 2020-05-25 MED ORDER — MORPHINE SULFATE (PF) 4 MG/ML IV SOLN
4.0000 mg | INTRAVENOUS | Status: DC | PRN
  Administered 2020-05-25: 4 mg via INTRAVENOUS
  Filled 2020-05-25: qty 1

## 2020-05-25 MED ORDER — ONDANSETRON HCL 4 MG/2ML IJ SOLN
4.0000 mg | Freq: Once | INTRAMUSCULAR | Status: AC
  Administered 2020-05-25: 4 mg via INTRAVENOUS
  Filled 2020-05-25: qty 2

## 2020-05-25 NOTE — ED Triage Notes (Signed)
RLQ pain x 2 days.  STates pain feels similar to kidney stones.  Has had stent placed to right side in the past.

## 2020-05-25 NOTE — ED Provider Notes (Signed)
[facility] Emergency Department Provider Note    First MD Initiated Contact with Patient 05/25/20 1737     (approximate)  I have reviewed the triage vital signs and the nursing notes.   HISTORY  Chief Complaint Abdominal Pain    HPI Nicole Wyatt is a 54 y.o. female   illness past medical history presents to the ER for evaluation of right lower quadrant pain for the past 24 hours.  States he did have episode of some dysuria no darker colored urine.  Denies any discharge.  No diarrhea.  No measured fevers pathology she had some chills.  Denies any flank pain or back pain.  Does still have her appendix.  States the discomfort is mild to moderate.  Does not take anything for that.  Does endorse some nausea associated with it.  Has a history of complicated stone requiring ureteral stent placement.  She called her urologist today who directed her to the ER for further evaluation.   Past Medical History:  Diagnosis Date  . Abnormal Pap smear of cervix   . Anxiety   . Arthritis   . Bipolar 1 disorder (HCC)   . COPD (chronic obstructive pulmonary disease) (HCC)   . Depression   . Dyspnea    with exertion   . History of kidney stones   . Hyperlipidemia   . Oxygen dependent    patient uses 2L oz PRN ; reports hardly ever uses it unless very SOB    . Rocky Mountain spotted fever 2018  . Sarcoidosis of lung (HCC)    managed by Dr Lindie SpruceSaddat   . Seizures (HCC) last seizure 05/2013  . Sleep apnea    uses CPAP nightly   . UTI (urinary tract infection) 11/13/2018   see ED visit in epic ; was dc'd with oral abx and reports completed and relief of sx    Family History  Problem Relation Age of Onset  . Hypertension Mother   . Arthritis Mother   . Heart disease Mother   . Heart failure Mother   . Hypertension Father   . Arthritis Father   . Prostate cancer Father   . Heart disease Father   . Heart attack Father   . Arrhythmia Father [id number]       pacemaker  implant  . Ovarian cancer Maternal Grandmother    Past Surgical History:  Procedure Laterality Date  . ABDOMINAL HYSTERECTOMY  1999  . BLADDER SURGERY    . CARDIAC CATHETERIZATION Bilateral 05/29/2016   Procedure: Right/Left Heart Cath and Coronary Angiography;  Surgeon: Lamar BlinksBruce J Kowalski, MD;  Location: [facility] INVASIVE CV LAB;  Service: Cardiovascular;  Laterality: Bilateral;  . COLONOSCOPY    . COLONOSCOPY N/A 07/19/2015   Procedure: COLONOSCOPY;  Surgeon: Earline MayotteJeffrey W Byrnett, MD;  [facility];  Service: Endoscopy;  Laterality: N/A;  . CYSTOSCOPY WITH STENT PLACEMENT Left 01/05/2020   Procedure: CYSTOSCOPY WITH STENT PLACEMENT;  Surgeon: Riki AltesStoioff, Scott C, MD;  Location: [facility] ORS;  Service: Urology;  Laterality: Left;  . CYSTOSCOPY/URETEROSCOPY/HOLMIUM LASER/STENT PLACEMENT Left 01/26/2020   Procedure: CYSTOSCOPY/URETEROSCOPY/HOLMIUM LASER/STENT Exchange;  Surgeon: Riki AltesStoioff, Scott C, MD;  Location: [facility] ORS;  Service: Urology;  Laterality: Left;  . FLEXIBLE BRONCHOSCOPY N/A 07/27/2016   Procedure: FLEXIBLE BRONCHOSCOPY;  Surgeon: Yevonne PaxSaadat A Khan, MD;  Location: [facility] ORS;  Service: Pulmonary;  Laterality: N/A;  . FOOT SURGERY    . JOINT REPLACEMENT Left    hip  . KIDNEY STONE SURGERY  7 years  .  TOTAL HIP ARTHROPLASTY Left 12/02/2018   Procedure: TOTAL HIP ARTHROPLASTY ANTERIOR APPROACH;  Surgeon: Renette Butters, MD;  Location: [facility] ORS;  Service: Orthopedics;  Laterality: Left;  . TUBAL LIGATION     Patient Active Problem List   Diagnosis Date Noted  . Kidney stone 01/05/2020  . Atopic dermatitis 09/08/2019  . Inflammatory polyarthritis ([place]) 09/08/2019  . Acute cystitis with hematuria 09/08/2019  . Other fatigue 07/19/2019  . Hypoglycemia 07/19/2019  . Arm weakness 03/07/2019  . Influenza A 02/24/2019  . Acute upper respiratory infection 02/24/2019  . Chronic obstructive pulmonary disease ([place]) 02/24/2019  . Acute otitis externa of both ears 01/06/2019  . Mild intermittent  asthma without complication 67/61/9509  . Primary osteoarthritis of hip 12/02/2018  . 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 OSA (obstructive sleep apnea) 10/13/2018  . 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 Rocky [place] spotted fever 07/02/2018  . Urinary tract infection without hematuria 07/02/2018  . Dysuria 06/23/2018  . Depression, major, recurrent, moderate ([place]) 03/26/2018  . Generalized edema 03/26/2018  . Anaphylactic syndrome 03/26/2018  . Cutaneous candidiasis 03/26/2018  . Bursitis of hip 02/03/2018  . Osteoarthritis of knee 02/03/2018  . Chronic pulmonary hypertension ([place]) 06/18/2016  . S/P cardiac cath 06/18/2016  . Unstable angina ([place]) 05/24/2016  . Bilateral leg edema 05/22/2016  . Shortness of breath 04/30/2016  . Skin lesion of right arm 03/01/2016  . Screening for breast cancer 06/10/2015  . Skin lesion of breast 06/10/2015  . Hx of seizure disorder 06/24/2014  . Left-sided weakness 06/24/2014  . Tobacco abuse 06/24/2014      Prior to Admission medications   Medication Sig Start Date End Date Taking? Authorizing Provider  albuterol (VENTOLIN HFA) 108 (90 Base) MCG/ACT inhaler Inhale 2 puffs into the lungs every 4 (four) hours as needed for wheezing or shortness of breath. 01/22/20   Dunn, Areta Haber, PA-C  aspirin EC 81 MG tablet Take 1 tablet (81 mg total) by mouth 2 (two) times daily. For DVT prophylaxis for 30 days after surgery. Patient taking differently: Take 81 mg by mouth daily.  12/02/18   Prudencio Burly III, PA-C  atorvastatin (LIPITOR) 20 MG tablet Take 20 mg by mouth daily.    [provider]  cephALEXin (KEFLEX) 500 MG capsule Take 1 capsule (500 mg total) by mouth 3 (three) times daily for 5 days. 05/25/20 05/30/20  Merlyn Lot, MD  EPINEPHrine (EPIPEN 2-PAK) 0.3 mg/0.3 mL IJ SOAJ injection Inject 0.3 mg into the muscle as needed for anaphylaxis.    [provider]  famotidine (PEPCID) 20 MG tablet Take 20 mg by mouth 2  (two) times daily.    [provider]  fluticasone (FLONASE) 50 MCG/ACT nasal spray Place 2 sprays into both nostrils daily. Patient taking differently: Place 2 sprays into both nostrils daily as needed for allergies.  02/16/19   Caryn Section Linden Dolin, PA-C  fluticasone (FLOVENT HFA) 110 MCG/ACT inhaler Inhale 2 puffs 2 x day for copd. Rinse mouth afterwards 01/22/20   Rise Mu, PA-C  furosemide (LASIX) 40 MG tablet Take 1 tablet (40 mg total) by mouth daily as needed for fluid. Patient taking differently: Take 40 mg by mouth daily as needed for fluid or edema.  01/06/19   Ronnell Freshwater, NP  meloxicam (MOBIC) 15 MG tablet Take 1 tablet (15 mg total) by mouth daily. 11/02/19   Ronnell Freshwater, NP  mometasone-formoterol (DULERA) 100-5 MCG/ACT AERO Inhale 2 puffs into the lungs daily  as needed for wheezing or shortness of breath. 03/10/19   Altamese Dilling, MD  ondansetron (ZOFRAN) 4 MG tablet Take 1 tablet (4 mg total) by mouth daily as needed. 05/25/20 05/25/21  Willy Eddy, MD  venlafaxine XR (EFFEXOR-XR) 75 MG 24 hr capsule Take 2 capsules (150 mg total) by mouth daily with breakfast. 11/02/19   Carlean Jews, NP    Allergies Bee venom, Ciprofloxacin, and Penicillins    Social History Social History   Tobacco Use  . Smoking status: Current Every Day Smoker    Packs/day: 0.50    Years: 14.00    Pack years: 7.00    Types: Cigarettes    Last attempt to quit: 05/07/2016    Years since quitting: 4.0  . Smokeless tobacco: Never Used  . Tobacco comment: 11-25-18 reports  down to just 1 cigarette per day   Substance Use Topics  . Alcohol use: Yes    Alcohol/week: 0.0 standard drinks    Comment: seldom   . Drug use: No    Review of Systems Patient denies headaches, rhinorrhea, blurry vision, numbness, shortness of breath, chest pain, edema, cough, abdominal pain, nausea, vomiting, diarrhea, dysuria, fevers, rashes or hallucinations unless otherwise stated above in  HPI. ____________________________________________   PHYSICAL EXAM:  VITAL SIGNS: Vitals:   05/25/20 1450  BP: (!) 143/74  Pulse: 67  Resp: 16  Temp: 99.3 F (37.4 C)  SpO2: 98%    Constitutional: Alert and oriented.  Eyes: Conjunctivae are normal.  Head: Atraumatic. Nose: No congestion/rhinnorhea. Mouth/Throat: Mucous membranes are moist.   Neck: No stridor. Painless ROM.  Cardiovascular: Normal rate, regular rhythm. Grossly normal heart sounds.  Good peripheral circulation. Respiratory: Normal respiratory effort.  No retractions. Lungs CTAB. Gastrointestinal: Soft with mild ttp in suprapubic and rlq.  No guarding or rebound. No distention. No abdominal bruits. No CVA tenderness. Genitourinary:  Musculoskeletal: No lower extremity tenderness nor edema.  No joint effusions. Neurologic:  Normal speech and language. No gross focal neurologic deficits are appreciated. No facial droop Skin:  Skin is warm, dry and intact. No rash noted. Psychiatric: Mood and affect are normal. Speech and behavior are normal.  ____________________________________________   LABS (all labs ordered are listed, but only abnormal results are displayed)  Results for orders placed or performed during the hospital encounter of 05/25/20 (from the past 24 hour(s))  Lipase, blood     Status: None   Collection Time: 05/25/20  2:55 PM  Result Value Ref Range   Lipase 27 11 - 51 U/L  Comprehensive metabolic panel     Status: Abnormal   Collection Time: 05/25/20  2:55 PM  Result Value Ref Range   Sodium 141 135 - 145 mmol/L   Potassium 4.0 3.5 - 5.1 mmol/L   Chloride 106 98 - 111 mmol/L   CO2 26 22 - 32 mmol/L   Glucose, Bld 95 70 - 99 mg/dL   BUN 10 6 - 20 mg/dL   Creatinine, Ser 7.06 0.44 - 1.00 mg/dL   Calcium 9.1 8.9 - 23.7 mg/dL   Total Protein 8.2 (H) 6.5 - 8.1 g/dL   Albumin 4.4 3.5 - 5.0 g/dL   AST 18 15 - 41 U/L   ALT 15 0 - 44 U/L   Alkaline Phosphatase 80 38 - 126 U/L   Total Bilirubin  0.9 0.3 - 1.2 mg/dL   GFR calc non Af Amer >60 >60 mL/min   GFR calc Af Amer >60 >60 mL/min   Anion gap  9 5 - 15  CBC     Status: None   Collection Time: 05/25/20  2:55 PM  Result Value Ref Range   WBC 4.8 4.0 - 10.5 K/uL   RBC 4.76 3.87 - 5.11 MIL/uL   Hemoglobin 15.0 12.0 - 15.0 g/dL   HCT 34.1 96.2 - 22.9 %   MCV 91.8 80.0 - 100.0 fL   MCH 31.5 26.0 - 34.0 pg   MCHC 34.3 30.0 - 36.0 g/dL   RDW 79.8 92.1 - 19.4 %   Platelets 235 150 - 400 K/uL   nRBC 0.0 0.0 - 0.2 %  Urinalysis, Complete w Microscopic     Status: Abnormal   Collection Time: 05/25/20  2:55 PM  Result Value Ref Range   Color, Urine YELLOW (A) YELLOW   APPearance HAZY (A) CLEAR   Specific Gravity, Urine 1.014 1.005 - 1.030   pH 7.0 5.0 - 8.0   Glucose, UA NEGATIVE NEGATIVE mg/dL   Hgb urine dipstick NEGATIVE NEGATIVE   Bilirubin Urine NEGATIVE NEGATIVE   Ketones, ur 5 (A) NEGATIVE mg/dL   Protein, ur NEGATIVE NEGATIVE mg/dL   Nitrite NEGATIVE NEGATIVE   Leukocytes,Ua SMALL (A) NEGATIVE   RBC / HPF 0-5 0 - 5 RBC/hpf   WBC, UA >50 (H) 0 - 5 WBC/hpf   Bacteria, UA RARE (A) NONE SEEN   Squamous Epithelial / LPF 0-5 0 - 5   Mucus PRESENT    ____________________________________________  ____________________________________________  RADIOLOGY  I personally reviewed all radiographic images ordered to evaluate for the above acute complaints and reviewed radiology reports and findings.  These findings were personally discussed with the patient.  Please see medical record for radiology report.  ____________________________________________   PROCEDURES  Procedure(s) performed:  Procedures    Critical Care performed: no ____________________________________________   INITIAL IMPRESSION / ASSESSMENT AND PLAN / ED COURSE  Pertinent labs & imaging results that were available during my care of the patient were reviewed by me and considered in my medical decision making (see chart for details).   DDX:  Cystitis, [place], stone, appendicitis, colitis, diverticulitis, mass  Nicole Wyatt is a 54 y.o. who presents to the ED with symptoms as described above.  Patient nontoxic-appearing low-grade temperature no white count.  Given location of pain will order CT imaging to evaluate for above differential.  She was given IV fluid IV pain medication IV antiemetic.  CT imaging does not show any evidence of acute intra-abdominal process.  Given her symptoms and location of discomfort will cover for cystitis.  Patient tolerated p.o. antibiotics.  She stable and appropriate for outpatient follow-up.  We discussed strict return precautions and signs and symptoms for which she should return to the ER.  Have discussed with the patient and available family all diagnostics and treatments performed thus far and all questions were answered to the best of my ability. The patient demonstrates understanding and agreement with plan.      The patient was evaluated in Emergency Department today for the symptoms described in the history of present illness. He/she was evaluated in the context of the global COVID-19 pandemic, which necessitated consideration that the patient might be at risk for infection with the SARS-CoV-2 virus that causes COVID-19. Institutional protocols and algorithms that pertain to the evaluation of patients at risk for COVID-19 are in a state of rapid change based on information released by regulatory bodies including the CDC and federal and state organizations. These policies and algorithms were followed during  the patient's care in the ED.  As part of my medical decision making, I reviewed the following data within the electronic MEDICAL RECORD NUMBER Nursing notes reviewed and incorporated, Labs reviewed, notes from prior ED visits and [place] Controlled Substance Database   ____________________________________________   FINAL CLINICAL IMPRESSION(S) / ED DIAGNOSES  Final diagnoses:  RLQ abdominal pain   Dysuria      NEW MEDICATIONS STARTED DURING THIS VISIT:  New Prescriptions   CEPHALEXIN (KEFLEX) 500 MG CAPSULE    Take 1 capsule (500 mg total) by mouth 3 (three) times daily for 5 days.   ONDANSETRON (ZOFRAN) 4 MG TABLET    Take 1 tablet (4 mg total) by mouth daily as needed.     Note:  This document was prepared using Dragon voice recognition software and may include unintentional dictation errors.    Willy Eddy, MD 05/25/20 [phone]

## 2020-05-25 NOTE — ED Notes (Signed)
See triage note  Presents with right flank and sided abd pain  States sx's started about 4 days ago  Hx of kidney stones in past   Positive nausea/vomiting

## 2020-05-25 NOTE — Discharge Instructions (Addendum)

## 2020-05-27 ENCOUNTER — Ambulatory Visit: Payer: Medicare Other | Admitting: Cardiology

## 2020-05-30 ENCOUNTER — Encounter: Payer: Self-pay | Admitting: Cardiology

## 2020-07-07 ENCOUNTER — Emergency Department: Payer: Medicare Other

## 2020-07-07 ENCOUNTER — Encounter: Payer: Self-pay | Admitting: Emergency Medicine

## 2020-07-07 ENCOUNTER — Other Ambulatory Visit: Payer: Self-pay

## 2020-07-07 ENCOUNTER — Emergency Department
Admission: EM | Admit: 2020-07-07 | Discharge: 2020-07-07 | Disposition: A | Discharge status: Home / Self Care | Payer: Medicare Other | Attending: Emergency Medicine | Admitting: Emergency Medicine

## 2020-07-07 DIAGNOSIS — Z8042 Family history of malignant neoplasm of prostate: Secondary | ICD-10-CM | POA: Insufficient documentation

## 2020-07-07 DIAGNOSIS — Z8041 Family history of malignant neoplasm of ovary: Secondary | ICD-10-CM | POA: Diagnosis not present

## 2020-07-07 DIAGNOSIS — R05 Cough: Secondary | ICD-10-CM | POA: Diagnosis present

## 2020-07-07 DIAGNOSIS — Z79899 Other long term (current) drug therapy: Secondary | ICD-10-CM | POA: Diagnosis not present

## 2020-07-07 DIAGNOSIS — Z7982 Long term (current) use of aspirin: Secondary | ICD-10-CM | POA: Diagnosis not present

## 2020-07-07 DIAGNOSIS — J9801 Acute bronchospasm: Secondary | ICD-10-CM | POA: Diagnosis not present

## 2020-07-07 DIAGNOSIS — F17210 Nicotine dependence, cigarettes, uncomplicated: Secondary | ICD-10-CM | POA: Insufficient documentation

## 2020-07-07 DIAGNOSIS — J449 Chronic obstructive pulmonary disease, unspecified: Secondary | ICD-10-CM | POA: Diagnosis not present

## 2020-07-07 LAB — URINALYSIS, COMPLETE (UACMP) WITH MICROSCOPIC
Bilirubin Urine: NEGATIVE
Glucose, UA: NEGATIVE mg/dL
Hgb urine dipstick: NEGATIVE
Ketones, ur: NEGATIVE mg/dL
Leukocytes,Ua: NEGATIVE
Nitrite: NEGATIVE
Protein, ur: NEGATIVE mg/dL
Specific Gravity, Urine: 1.019 (ref 1.005–1.030)
pH: 7 (ref 5.0–8.0)

## 2020-07-07 LAB — COMPREHENSIVE METABOLIC PANEL
ALT: 17 U/L (ref 0–44)
AST: 18 U/L (ref 15–41)
Albumin: 4.5 g/dL (ref 3.5–5.0)
Alkaline Phosphatase: 82 U/L (ref 38–126)
Anion gap: 7 (ref 5–15)
BUN: 18 mg/dL (ref 6–20)
CO2: 28 mmol/L (ref 22–32)
Calcium: 8.8 mg/dL — ABNORMAL LOW (ref 8.9–10.3)
Chloride: 105 mmol/L (ref 98–111)
Creatinine, Ser: 0.77 mg/dL (ref 0.44–1.00)
GFR calc Af Amer: >60 mL/min (ref >60)
GFR calc non Af Amer: >60 mL/min (ref >60)
Glucose, Bld: 95 mg/dL (ref 70–99)
Potassium: 3.9 mmol/L (ref 3.5–5.1)
Sodium: 140 mmol/L (ref 135–145)
Total Bilirubin: 0.9 mg/dL (ref 0.3–1.2)
Total Protein: 8.3 g/dL — ABNORMAL HIGH (ref 6.5–8.1)

## 2020-07-07 LAB — CBC
HCT: 42.2 % (ref 36.0–46.0)
Hemoglobin: 14.4 g/dL (ref 12.0–15.0)
MCH: 32 pg (ref 26.0–34.0)
MCHC: 34.1 g/dL (ref 30.0–36.0)
MCV: 93.8 fL (ref 80.0–100.0)
Platelets: 249 10*3/uL (ref 150–400)
RBC: 4.5 MIL/uL (ref 3.87–5.11)
RDW: 14.4 % (ref 11.5–15.5)
WBC: 5 10*3/uL (ref 4.0–10.5)
nRBC: 0 % (ref 0.0–0.2)

## 2020-07-07 LAB — LIPASE, BLOOD: Lipase: 25 U/L (ref 11–51)

## 2020-07-07 MED ORDER — IPRATROPIUM-ALBUTEROL 0.5-2.5 (3) MG/3ML IN SOLN
3.0000 mL | Freq: Once | RESPIRATORY_TRACT | Status: AC
  Administered 2020-07-07: 3 mL via RESPIRATORY_TRACT
  Filled 2020-07-07: qty 3

## 2020-07-07 MED ORDER — PREDNISONE 50 MG PO TABS: 50.0000 mg | ORAL_TABLET | Freq: Every day | ORAL | 0 refills | Status: DC

## 2020-07-07 MED ORDER — ALBUTEROL SULFATE HFA 108 (90 BASE) MCG/ACT IN AERS
2.0000 | INHALATION_SPRAY | Freq: Four times a day (QID) | RESPIRATORY_TRACT | 0 refills | Status: DC | PRN
Start: 2020-07-07 — End: 2020-08-18

## 2020-07-07 MED ORDER — SODIUM CHLORIDE 0.9% FLUSH: 3.0000 mL | Freq: Once | INTRAVENOUS | Status: DC

## 2020-07-07 NOTE — ED Triage Notes (Signed)
Pt via pov from home with cough and abdominal pain since Friday. Pt states she has been coughing up blood and showed this nurse a photo of phlegm with small red spots in it. Pt also report left side abdominal pain, states this feels similar to kidney stones in the past. Pt alert & oriented, nad noted.

## 2020-07-07 NOTE — ED Notes (Signed)
See triage note, pt to ED for "not feeling good" for past few weeks, reports rib pain from coughing, states she has been coughing up some blood. Reports nausea, vomiting and diarrhea. Denies fevers.  Pt in NAD at this time

## 2020-07-07 NOTE — ED Provider Notes (Signed)
[facility] Emergency Department Provider Note   ____________________________________________    I have reviewed the triage vital signs and the nursing notes.   HISTORY  Chief Complaint Cough and abdominal pain    HPI Nicole Wyatt is a 54 y.o. female with a history as noted below who presents with complaints of productive cough for several days.  She reports yellow phlegm.  She denies fevers or chills.  Denies shortness of breath.  Yesterday she coughed once all small amount of blood in sputum.  Has discomfort along her upper abdomen/lower chest the diaphragm.  She thinks this is related to coughing.  No sick contacts.  Is receiving her first Covid vaccine tomorrow.  Has not take anything for this  Past Medical History:  Diagnosis Date  . Abnormal Pap smear of cervix   . Anxiety   . Arthritis   . Bipolar 1 disorder (HCC)   . COPD (chronic obstructive pulmonary disease) (HCC)   . Depression   . Dyspnea    with exertion   . History of kidney stones   . Hyperlipidemia   . Oxygen dependent    patient uses 2L oz PRN ; reports hardly ever uses it unless very SOB    . Rocky Mountain spotted fever 2018  . Sarcoidosis of lung (HCC)    managed by Dr Lindie Spruce   . Seizures (HCC) last seizure 05/2013  . Sleep apnea    uses CPAP nightly   . UTI (urinary tract infection) 11/13/2018   see ED visit in epic ; was dc'd with oral abx and reports completed and relief of sx     Patient Active Problem List   Diagnosis Date Noted  . Kidney stone 01/05/2020  . Atopic dermatitis 09/08/2019  . 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/08/2019  . Acute cystitis with hematuria 09/08/2019  . Other fatigue 07/19/2019  . Hypoglycemia 07/19/2019  . Arm weakness 03/07/2019  . Influenza A 02/24/2019  . Acute upper respiratory infection 02/24/2019  . Chronic obstructive pulmonary disease (HCC) 02/24/2019  . Acute otitis externa of both ears 01/06/2019  . Mild intermittent  asthma without complication 01/06/2019  . Primary osteoarthritis of hip 12/02/2018  . 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 OSA (obstructive sleep apnea) 10/13/2018  . 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 [place] spotted fever 07/02/2018  . Urinary tract infection without hematuria 07/02/2018  . Dysuria 06/23/2018  . Depression, major, recurrent, moderate (HCC) 03/26/2018  . Generalized edema 03/26/2018  . Anaphylactic syndrome 03/26/2018  . Cutaneous candidiasis 03/26/2018  . Bursitis of hip 02/03/2018  . Osteoarthritis of knee 02/03/2018  . Chronic pulmonary hypertension (HCC) 06/18/2016  . S/P cardiac cath 06/18/2016  . Unstable angina (HCC) 05/24/2016  . Bilateral leg edema 05/22/2016  . Shortness of breath 04/30/2016  . Skin lesion of right arm 03/01/2016  . Screening for breast cancer 06/10/2015  . Skin lesion of breast 06/10/2015  . Hx of seizure disorder 06/24/2014  . Left-sided weakness 06/24/2014  . Tobacco abuse 06/24/2014    Past Surgical History:  Procedure Laterality Date  . ABDOMINAL HYSTERECTOMY  1999  . BLADDER SURGERY    . CARDIAC CATHETERIZATION Bilateral 05/29/2016   Procedure: Right/Left Heart Cath and Coronary Angiography;  Surgeon: Lamar Blinks, MD;  Location: [facility] INVASIVE CV LAB;  Service: Cardiovascular;  Laterality: Bilateral;  . COLONOSCOPY    . COLONOSCOPY N/A 07/19/2015   Procedure: COLONOSCOPY;  Surgeon: Earline Mayotte, MD;  Location: [facility] ENDOSCOPY;  Service: Endoscopy;  Laterality: N/A;  . CYSTOSCOPY WITH STENT PLACEMENT Left 01/05/2020   Procedure: CYSTOSCOPY WITH STENT PLACEMENT;  Surgeon: Riki Altes, MD;  Location: [facility] ORS;  Service: Urology;  Laterality: Left;  . CYSTOSCOPY/URETEROSCOPY/HOLMIUM LASER/STENT PLACEMENT Left 01/26/2020   Procedure: CYSTOSCOPY/URETEROSCOPY/HOLMIUM LASER/STENT Exchange;  Surgeon: Riki Altes, MD;  Location: [facility] ORS;  Service: Urology;  Laterality: Left;  . FLEXIBLE BRONCHOSCOPY N/A  07/27/2016   Procedure: FLEXIBLE BRONCHOSCOPY;  Surgeon: Yevonne Pax, MD;  Location: [facility] ORS;  Service: Pulmonary;  Laterality: N/A;  . FOOT SURGERY    . JOINT REPLACEMENT Left    hip  . KIDNEY STONE SURGERY  7 years  . TOTAL HIP ARTHROPLASTY Left 12/02/2018   Procedure: TOTAL HIP ARTHROPLASTY ANTERIOR APPROACH;  Surgeon: Sheral Apley, MD;  Location: [facility] ORS;  Service: Orthopedics;  Laterality: Left;  . TUBAL LIGATION      Prior to Admission medications   Medication Sig Start Date End Date Taking? Authorizing Provider  albuterol (VENTOLIN HFA) 108 (90 Base) MCG/ACT inhaler Inhale 2 puffs into the lungs every 6 (six) hours as needed for wheezing or shortness of breath. 07/07/20   Jene Every, MD  aspirin EC 81 MG tablet Take 1 tablet (81 mg total) by mouth 2 (two) times daily. For DVT prophylaxis for 30 days after surgery. Patient taking differently: Take 81 mg by mouth daily.  12/02/18   Albina Billet III, PA-C  atorvastatin (LIPITOR) 20 MG tablet Take 20 mg by mouth daily.    [provider]  EPINEPHrine (EPIPEN 2-PAK) 0.3 mg/0.3 mL IJ SOAJ injection Inject 0.3 mg into the muscle as needed for anaphylaxis.    [provider]  famotidine (PEPCID) 20 MG tablet Take 20 mg by mouth 2 (two) times daily.    [provider]  fluticasone (FLONASE) 50 MCG/ACT nasal spray Place 2 sprays into both nostrils daily. Patient taking differently: Place 2 sprays into both nostrils daily as needed for allergies.  02/16/19   Sherrie Mustache Roselyn Bering, PA-C  fluticasone (FLOVENT HFA) 110 MCG/ACT inhaler Inhale 2 puffs 2 x day for copd. Rinse mouth afterwards 01/22/20   Sondra Barges, PA-C  furosemide (LASIX) 40 MG tablet Take 1 tablet (40 mg total) by mouth daily as needed for fluid. Patient taking differently: Take 40 mg by mouth daily as needed for fluid or edema.  01/06/19   Carlean Jews, NP  meloxicam (MOBIC) 15 MG tablet Take 1 tablet (15 mg total) by mouth daily. 11/02/19    Carlean Jews, NP  mometasone-formoterol (DULERA) 100-5 MCG/ACT AERO Inhale 2 puffs into the lungs daily as needed for wheezing or shortness of breath. 03/10/19   Altamese Dilling, MD  ondansetron (ZOFRAN) 4 MG tablet Take 1 tablet (4 mg total) by mouth daily as needed. 05/25/20 05/25/21  Willy Eddy, MD  predniSONE (DELTASONE) 50 MG tablet Take 1 tablet (50 mg total) by mouth daily with breakfast. 07/07/20   Jene Every, MD  venlafaxine XR (EFFEXOR-XR) 75 MG 24 hr capsule Take 2 capsules (150 mg total) by mouth daily with breakfast. 11/02/19   Carlean Jews, NP     Allergies Bee venom, Ciprofloxacin, and Penicillins  Family History  Problem Relation Age of Onset  . Hypertension Mother   . Arthritis Mother   . Heart disease Mother   . Heart failure Mother   . Hypertension Father   . Arthritis Father   . Prostate cancer Father   . Heart disease  Father   . Heart attack Father   . Arrhythmia Father 1968       pacemaker implant  . Ovarian cancer Maternal Grandmother     Social History Social History   Tobacco Use  . Smoking status: Current Every Day Smoker    Packs/day: 0.50    Years: 14.00    Pack years: 7.00    Types: Cigarettes    Last attempt to quit: 05/07/2016    Years since quitting: 4.1  . Smokeless tobacco: Never Used  . Tobacco comment: 11-25-18 reports  down to just 1 cigarette per day   Vaping Use  . Vaping Use: Never used  Substance Use Topics  . Alcohol use: Yes    Alcohol/week: 0.0 standard drinks    Comment: seldom   . Drug use: No    Review of Systems  Constitutional: No fever/chills Eyes: No visual changes.  ENT: No sore throat. Cardiovascular: As above Respiratory: As above Gastrointestinal: No abdominal pain.   Genitourinary: Negative for dysuria. Musculoskeletal: Negative for back pain. Skin: Negative for rash. Neurological: Negative for headaches    ____________________________________________   PHYSICAL EXAM:  VITAL  SIGNS: ED Triage Vitals  Enc Vitals Group     BP 07/07/20 1313 139/86     Pulse Rate 07/07/20 1313 71     Resp 07/07/20 1313 20     Temp 07/07/20 1313 98.9 F (37.2 C)     Temp Source 07/07/20 1313 Oral     SpO2 07/07/20 1313 96 %     Weight 07/07/20 1314 105.2 kg (232 lb)     Height 07/07/20 1314 1.702 m (5\' 7" )     Head Circumference --      Peak Flow --      Pain Score 07/07/20 1314 9     Pain Loc --      Pain Edu? --      Excl. in GC? --     Constitutional: Alert and oriented.   Nose: No congestion/rhinnorhea. Mouth/Throat: Mucous membranes are moist.    Cardiovascular: Normal rate, regular rhythm. Grossly normal heart sounds.  Good peripheral circulation. Respiratory: Normal respiratory effort.  No retractions.  Scattered mild wheezing Gastrointestinal: Soft and nontender. No distention.  No CVA tenderness.  Musculoskeletal: No lower extremity tenderness nor edema.  Warm and well perfused Neurologic:  Normal speech and language. No gross focal neurologic deficits are appreciated.  Skin:  Skin is warm, dry and intact. No rash noted. Psychiatric: Mood and affect are normal. Speech and behavior are normal.  ____________________________________________   LABS (all labs ordered are listed, but only abnormal results are displayed)  Labs Reviewed  COMPREHENSIVE METABOLIC PANEL - Abnormal; Notable for the following components:      Result Value   Calcium 8.8 (*)    Total Protein 8.3 (*)    All other components within normal limits  URINALYSIS, COMPLETE (UACMP) WITH MICROSCOPIC - Abnormal; Notable for the following components:   Color, Urine YELLOW (*)    APPearance HAZY (*)    Bacteria, UA RARE (*)    All other components within normal limits  LIPASE, BLOOD  CBC   ____________________________________________  EKG  None ____________________________________________  RADIOLOGY  Chest x-ray reviewed by me, no infiltrate effusion, reassuring  exam ____________________________________________   PROCEDURES  Procedure(s) performed: No  Procedures   Critical Care performed: No ____________________________________________   INITIAL IMPRESSION / ASSESSMENT AND PLAN / ED COURSE  Pertinent labs & imaging results that were available during  my care of the patient were reviewed by me and considered in my medical decision making (see chart for details).  Patient presents with complaints of cough as noted above.  Differential includes upper respiratory infection, pneumonia, less likely Covid given no myalgias or fever or loss of taste or smell  Wheezing noted on exam mostly consistent with bronchospasm versus COPD, she does smoke cigarettes.  She does report history of sarcoidosis.  Chest x-ray is negative for infiltrate  Lab work demonstrates normal white blood cell count, normal chemistries, normal lipase and urine  Suspect lower chest wall pain is related to coughing.  Treated with DuoNeb in the emergency department with improvement in wheezing.  Will treat with prednisone, albuterol inhaler for presumed bronchospasm, outpatient follow-up      ____________________________________________   FINAL CLINICAL IMPRESSION(S) / ED DIAGNOSES  Final diagnoses:  Bronchospasm        Note:  This document was prepared using Dragon voice recognition software and may include unintentional dictation errors.   Jene Every, MD 07/07/20 [phone]

## 2020-08-18 ENCOUNTER — Ambulatory Visit (INDEPENDENT_AMBULATORY_CARE_PROVIDER_SITE_OTHER): Payer: Medicare Other | Admitting: Pulmonary Disease

## 2020-08-18 ENCOUNTER — Other Ambulatory Visit: Payer: Self-pay

## 2020-08-18 ENCOUNTER — Encounter: Payer: Self-pay | Admitting: Pulmonary Disease

## 2020-08-18 VITALS — BP 148/82 | HR 59 | Temp 97.3°F | Ht 67.0 in | Wt 234.6 lb

## 2020-08-18 DIAGNOSIS — R05 Cough: Secondary | ICD-10-CM | POA: Diagnosis not present

## 2020-08-18 DIAGNOSIS — G4733 Obstructive sleep apnea (adult) (pediatric): Secondary | ICD-10-CM | POA: Diagnosis not present

## 2020-08-18 DIAGNOSIS — R059 Cough, unspecified: Secondary | ICD-10-CM

## 2020-08-18 DIAGNOSIS — Z720 Tobacco use: Secondary | ICD-10-CM

## 2020-08-18 MED ORDER — BUDESONIDE-FORMOTEROL FUMARATE 160-4.5 MCG/ACT IN AERO: 2.0000 | INHALATION_SPRAY | Freq: Two times a day (BID) | RESPIRATORY_TRACT | 6 refills | Status: AC

## 2020-08-18 MED ORDER — ALBUTEROL SULFATE HFA 108 (90 BASE) MCG/ACT IN AERS: 2.0000 | INHALATION_SPRAY | Freq: Four times a day (QID) | RESPIRATORY_TRACT | 0 refills | Status: DC | PRN

## 2020-08-18 MED ORDER — AZITHROMYCIN 250 MG PO TABS
ORAL_TABLET | ORAL | 0 refills | Status: AC
Start: 2020-08-18 — End: 2020-08-23

## 2020-08-18 NOTE — Progress Notes (Signed)
[place] [facility]  Chief Complaint  Patient presents with  . Consult    Patient has COPD, Sarcoidosis and shortness of breathing with exertion. States that her exergy level is 0 and has been that way for about 3 weeks. Patient is wheezing, coughing with green/yellow sputum, her ribs are sore and is thinking she may have pnuemonia. 2nd Covid vaccine Friday     Constitutional:  BP (!) 148/82 (BP Location: Left Arm, Patient Position: Sitting, Cuff Size: Large)   Pulse (!) 59   Temp (!) 97.3 F (36.3 C) (Temporal)   Ht 5\' 7"  (1.702 m)   Wt 234 lb 9.6 oz (106.4 kg)   SpO2 100%   BMI 36.74 kg/m   Past Medical History:  Influenza A March 2020, Allergic rhinitis, COPD/Asthma, OSA, Sarcoidosis, Pulmonary hypertension  Past Surgical History:  Her  has a past surgical history that includes Abdominal hysterectomy (1999); Colonoscopy; Colonoscopy (N/A, 07/19/2015); Cardiac catheterization (Bilateral, 05/29/2016); Flexible bronchoscopy (N/A, 07/27/2016); Bladder surgery; Foot surgery; Tubal ligation; Kidney stone surgery (7 years); Total hip arthroplasty (Left, 12/02/2018); Cystoscopy with stent placement (Left, 01/05/2020); Joint replacement (Left); and Cystoscopy/ureteroscopy/holmium laser/stent placement (Left, 01/26/2020).  Brief Summary:  Nicole Wyatt is a 54 y.o. female with smoker with cough.  She has history of COPD with asthma, pulmonary sarcoidosis, and obstructive sleep apnea.      Subjective:   She smoked up to 1.5 ppd.  Down to less than 1 ppd.  Was seen previously at [facility] for pulmonary hypertension.  Told she had COPD with asthma and obstructive sleep apnea.  Was diagnosed previously with sarcoidosis.  Hasn't been on steroids for sarcoidosis recently.  Was previously on therapy for pulmonary hypertension, but not recently.  She needs to re-establish with pulmonary medicine.  She has cough with thick green-yellow sputum for past couple of weeks.   Intermittent chest tightness.  No sinus congestion, sore throat, or fever.  Has been using albuterol 4 or 5 times per day - helps, but doesn't last.    Uses CPAP nightly.  Was getting supplies from [facility].  Needs a new provider to help manage this. CXR from 05/10/20 showed mild scarring at Rt base.  Physical Exam:   Appearance - well kempt   ENMT - no sinus tenderness, no oral exudate, no LAN, Mallampati 3 airway, no stridor  Respiratory - equal breath sounds bilaterally, no wheezing or rales  CV - s1s2 regular rate and rhythm, no murmurs  Ext - no clubbing, no edema  Skin - no rashes  Psych - normal mood and affect   Pulmonary testing:   PFT 02/01/16 >> FEV1 2.29 (96%), FEV1% 81, TLC 3.05 (66%), DLCO 52%  Chest Imaging:   CT angio chest 10/19/14 >> patchy GGO upper lobes  CT angio chest 03/07/19 >> atelectasis  Sleep Tests:    Cardiac Tests:   Echo 01/21/20 >> EF 60 to 65%, mild LVH, grade 1 DD  Social History:  She  reports that she has been smoking cigarettes. She has a 7.00 pack-year smoking history. She has never used smokeless tobacco. She reports current alcohol use. She reports that she does not use drugs.  Family History:  Her family history includes Arrhythmia (age of onset: 10) in her father; Arthritis in her father and mother; Heart attack in her father; Heart disease in her father and mother; Heart failure in her mother; Hypertension in her father and mother; Ovarian cancer in her maternal grandmother; Prostate cancer  in her father.     Assessment/Plan:   Cough with acute bronchitis. - will give course of zithromax and chest xray - don't think she needs prednisone at this time  Tobacco abuse with history of COPD with asthma. - discussed importance of smoking cessation; she will continue to gradually cut down - will have her uses symbicort with prn albuterol - will need to schedule repeat PFT when her acute bronchitis has resolved  History of  pulmonary sarcoidosis. - will check chest xray now - depending on findings will determine if she needs high resolution CT chest   Obstructive sleep apnea. - she reports compliance - will get copy of her CPAP download - she says she gets supplies from [facility]  History of pulmonary hypertension. - was previously on therapy for this through [facility] - most recent Echo was unremarkable   Time Spent Involved in Patient Care on Day of Examination:  48 minutes.  Follow up:  Patient Instructions  Zithromax 250 mg pill >> 2 pills on day 1, then 1 pill daily for next 4 days  Symbicort two puffs in the morning and two puffs in the evening, and rinse your mouth after each use  Will schedule chest xray  Will get copy of your CPAP report from [facility]  Follow up in 3 weeks with Dr. Craige Cotta or Nurse Practitioner   Medication List:   Allergies as of 08/18/2020      Reactions   Bee Venom Anaphylaxis   Ciprofloxacin Nausea Only   Penicillins Diarrhea, Rash   Did it involve swelling of the face/tongue/throat, SOB, or low BP? No Did it involve sudden or severe rash/hives, skin peeling, or any reaction on the inside of your mouth or nose? No Did you need to seek medical attention at a hospital or doctor's office? No When did it last happen?4-5 years If all above answers are "NO", may proceed with cephalosporin use.      Medication List       Accurate as of August 18, 2020 11:36 AM. If you have any questions, ask your nurse or doctor.        STOP taking these medications   atorvastatin 20 MG tablet Commonly known as: LIPITOR Stopped by: Coralyn Helling, MD   famotidine 20 MG tablet Commonly known as: PEPCID Stopped by: Coralyn Helling, MD   Flovent HFA 110 MCG/ACT inhaler Generic drug: fluticasone Stopped by: Coralyn Helling, MD   mometasone-formoterol 100-5 MCG/ACT Aero Commonly known as: DULERA Stopped by: Coralyn Helling, MD   ondansetron 4 MG tablet Commonly known as: Zofran Stopped  by: Coralyn Helling, MD   predniSONE 50 MG tablet Commonly known as: DELTASONE Stopped by: Coralyn Helling, MD   venlafaxine XR 75 MG 24 hr capsule Commonly known as: EFFEXOR-XR Stopped by: Coralyn Helling, MD     TAKE these medications   albuterol 108 (90 Base) MCG/ACT inhaler Commonly known as: VENTOLIN HFA Inhale 2 puffs into the lungs every 6 (six) hours as needed for wheezing or shortness of breath.   aspirin EC 81 MG tablet Take 1 tablet (81 mg total) by mouth 2 (two) times daily. For DVT prophylaxis for 30 days after surgery. What changed:   when to take this  additional instructions   azithromycin 250 MG tablet Commonly known as: ZITHROMAX Take 2 tablets (500 mg total) by mouth daily for 1 day, THEN 1 tablet (250 mg total) daily for 4 days. Start taking on: August 18, 2020 Started by: Coralyn Helling, MD  budesonide-formoterol 160-4.5 MCG/ACT inhaler Commonly known as: SYMBICORT Inhale 2 puffs into the lungs 2 (two) times daily. Started by: Coralyn Helling, MD   EpiPen 2-Pak 0.3 mg/0.3 mL Soaj injection Generic drug: EPINEPHrine Inject 0.3 mg into the muscle as needed for anaphylaxis.   fluticasone 50 MCG/ACT nasal spray Commonly known as: FLONASE Place 2 sprays into both nostrils daily. What changed:   when to take this  reasons to take this   furosemide 40 MG tablet Commonly known as: LASIX Take 1 tablet (40 mg total) by mouth daily as needed for fluid. What changed: reasons to take this   meloxicam 15 MG tablet Commonly known as: MOBIC Take 1 tablet (15 mg total) by mouth daily.       Signature:  Coralyn Helling, MD [facility] Pulmonary/Critical Care Pager - [phone] 08/18/2020, 11:36 AM

## 2020-08-18 NOTE — Patient Instructions (Signed)
Zithromax 250 mg pill >> 2 pills on day 1, then 1 pill daily for next 4 days  Symbicort two puffs in the morning and two puffs in the evening, and rinse your mouth after each use  Will schedule chest xray  Will get copy of your CPAP report from [facility]  Follow up in 3 weeks with Dr. Craige Cotta or Nurse Practitioner

## 2020-08-31 ENCOUNTER — Ambulatory Visit: Payer: Medicare Other | Admitting: Adult Health

## 2020-10-19 ENCOUNTER — Other Ambulatory Visit: Payer: Self-pay

## 2020-10-19 ENCOUNTER — Emergency Department: Payer: Medicare Other

## 2020-10-19 ENCOUNTER — Encounter: Payer: Self-pay | Admitting: Emergency Medicine

## 2020-10-19 ENCOUNTER — Emergency Department
Admission: EM | Admit: 2020-10-19 | Discharge: 2020-10-19 | Disposition: A | Discharge status: Home / Self Care | Payer: Medicare Other | Attending: Emergency Medicine | Admitting: Emergency Medicine

## 2020-10-19 DIAGNOSIS — Z7951 Long term (current) use of inhaled steroids: Secondary | ICD-10-CM | POA: Insufficient documentation

## 2020-10-19 DIAGNOSIS — Z7982 Long term (current) use of aspirin: Secondary | ICD-10-CM | POA: Diagnosis not present

## 2020-10-19 DIAGNOSIS — J4520 Mild intermittent asthma, uncomplicated: Secondary | ICD-10-CM | POA: Diagnosis not present

## 2020-10-19 DIAGNOSIS — J449 Chronic obstructive pulmonary disease, unspecified: Secondary | ICD-10-CM | POA: Insufficient documentation

## 2020-10-19 DIAGNOSIS — S99929A Unspecified injury of unspecified foot, initial encounter: Secondary | ICD-10-CM

## 2020-10-19 DIAGNOSIS — Y9301 Activity, walking, marching and hiking: Secondary | ICD-10-CM | POA: Diagnosis not present

## 2020-10-19 DIAGNOSIS — S99921A Unspecified injury of right foot, initial encounter: Secondary | ICD-10-CM | POA: Diagnosis present

## 2020-10-19 DIAGNOSIS — W228XXA Striking against or struck by other objects, initial encounter: Secondary | ICD-10-CM | POA: Diagnosis not present

## 2020-10-19 DIAGNOSIS — Z96642 Presence of left artificial hip joint: Secondary | ICD-10-CM | POA: Insufficient documentation

## 2020-10-19 DIAGNOSIS — S9031XA Contusion of right foot, initial encounter: Secondary | ICD-10-CM | POA: Insufficient documentation

## 2020-10-19 DIAGNOSIS — F17210 Nicotine dependence, cigarettes, uncomplicated: Secondary | ICD-10-CM | POA: Diagnosis not present

## 2020-10-19 MED ORDER — NAPROXEN 500 MG PO TABS
500.0000 mg | ORAL_TABLET | Freq: Once | ORAL | Status: AC
  Administered 2020-10-19: 500 mg via ORAL
  Filled 2020-10-19: qty 1

## 2020-10-19 NOTE — ED Provider Notes (Signed)
[facility] Emergency Department Provider Note  ____________________________________________   First MD Initiated Contact with Patient 10/19/20 1905     (approximate)  I have reviewed the triage vital signs and the nursing notes.   HISTORY  Chief Complaint Foot Injury   HPI Nicole Wyatt is a 54 y.o. female with a past medical history of COPD, bipolar disorder, anxiety, arthritis, HDL, sarcoid disease, OSA, and kidney stones presents for assessment of right midfoot pain after she struck her right foot into a TV that was sitting on the floor when walking out of the bathroom yesterday evening around 3 AM.  Patient states she has had pain in her right foot specifically at the base of the second and third toes and over the top of the foot but none in the ankle knee or anywhere else.  Patient denies any other recent falls or injuries.  She denies any other acute sick symptoms including fevers, chills, cough, nausea, vomiting, diarrhea, dysuria, rash, or other acute complaints.  No other recent injuries in this location.  Patient has been able to bear weight.         Past Medical History:  Diagnosis Date  . Abnormal Pap smear of cervix   . Anxiety   . Arthritis   . Bipolar 1 disorder (HCC)   . COPD (chronic obstructive pulmonary disease) (HCC)   . Depression   . Dyspnea    with exertion   . History of kidney stones   . Hyperlipidemia   . Oxygen dependent    patient uses 2L oz PRN ; reports hardly ever uses it unless very SOB    . Rocky Mountain spotted fever 2018  . Sarcoidosis of lung (HCC)    managed by Dr Lindie Spruce   . Seizures (HCC) last seizure 05/2013  . Sleep apnea    uses CPAP nightly   . UTI (urinary tract infection) 11/13/2018   see ED visit in epic ; was dc'd with oral abx and reports completed and relief of sx     Patient Active Problem List   Diagnosis Date Noted  . Kidney stone 01/05/2020  . Atopic dermatitis 09/08/2019  .  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/08/2019  . Acute cystitis with hematuria 09/08/2019  . Other fatigue 07/19/2019  . Hypoglycemia 07/19/2019  . Arm weakness 03/07/2019  . Influenza A 02/24/2019  . Acute upper respiratory infection 02/24/2019  . Chronic obstructive pulmonary disease (HCC) 02/24/2019  . Acute otitis externa of both ears 01/06/2019  . Mild intermittent asthma without complication 01/06/2019  . Primary osteoarthritis of hip 12/02/2018  . 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 OSA (obstructive sleep apnea) 10/13/2018  . 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 Rocky [place] spotted fever 07/02/2018  . Urinary tract infection without hematuria 07/02/2018  . Dysuria 06/23/2018  . Depression, major, recurrent, moderate (HCC) 03/26/2018  . Generalized edema 03/26/2018  . Anaphylactic syndrome 03/26/2018  . Cutaneous candidiasis 03/26/2018  . Bursitis of hip 02/03/2018  . Osteoarthritis of knee 02/03/2018  . Chronic pulmonary hypertension (HCC) 06/18/2016  . S/P cardiac cath 06/18/2016  . Unstable angina (HCC) 05/24/2016  . Bilateral leg edema 05/22/2016  . Shortness of breath 04/30/2016  . Skin lesion of right arm 03/01/2016  . Screening for breast cancer 06/10/2015  . Skin lesion of breast 06/10/2015  . Hx of seizure disorder 06/24/2014  . Left-sided weakness 06/24/2014  . Tobacco abuse 06/24/2014    Past Surgical History:  Procedure Laterality Date  .  ABDOMINAL HYSTERECTOMY  1999  . BLADDER SURGERY    . CARDIAC CATHETERIZATION Bilateral 05/29/2016   Procedure: Right/Left Heart Cath and Coronary Angiography;  Surgeon: Lamar BlinksBruce J Kowalski, MD;  Location: [facility] INVASIVE CV LAB;  Service: Cardiovascular;  Laterality: Bilateral;  . COLONOSCOPY    . COLONOSCOPY N/A 07/19/2015   Procedure: COLONOSCOPY;  Surgeon: Earline MayotteJeffrey W Byrnett, MD;  [facility];  Service: Endoscopy;  Laterality: N/A;  . CYSTOSCOPY WITH STENT PLACEMENT Left 01/05/2020   Procedure:  CYSTOSCOPY WITH STENT PLACEMENT;  Surgeon: Riki AltesStoioff, Scott C, MD;  Location: [facility] ORS;  Service: Urology;  Laterality: Left;  . CYSTOSCOPY/URETEROSCOPY/HOLMIUM LASER/STENT PLACEMENT Left 01/26/2020   Procedure: CYSTOSCOPY/URETEROSCOPY/HOLMIUM LASER/STENT Exchange;  Surgeon: Riki AltesStoioff, Scott C, MD;  Location: [facility] ORS;  Service: Urology;  Laterality: Left;  . FLEXIBLE BRONCHOSCOPY N/A 07/27/2016   Procedure: FLEXIBLE BRONCHOSCOPY;  Surgeon: Yevonne PaxSaadat A Khan, MD;  Location: [facility] ORS;  Service: Pulmonary;  Laterality: N/A;  . FOOT SURGERY    . JOINT REPLACEMENT Left    hip  . KIDNEY STONE SURGERY  7 years  . TOTAL HIP ARTHROPLASTY Left 12/02/2018   Procedure: TOTAL HIP ARTHROPLASTY ANTERIOR APPROACH;  Surgeon: Sheral ApleyMurphy, Timothy D, MD;  Location: [facility] ORS;  Service: Orthopedics;  Laterality: Left;  . TUBAL LIGATION      Prior to Admission medications   Medication Sig Start Date End Date Taking? Authorizing Provider  albuterol (VENTOLIN HFA) 108 (90 Base) MCG/ACT inhaler Inhale 2 puffs into the lungs every 6 (six) hours as needed for wheezing or shortness of breath. 08/18/20   Coralyn HellingSood, Vineet, MD  aspirin EC 81 MG tablet Take 1 tablet (81 mg total) by mouth 2 (two) times daily. For DVT prophylaxis for 30 days after surgery. Patient taking differently: Take 81 mg by mouth daily.  12/02/18   Martensen, Lucretia KernHenry Calvin III, PA-C  budesonide-formoterol (SYMBICORT) 160-4.5 MCG/ACT inhaler Inhale 2 puffs into the lungs 2 (two) times daily. 08/18/20   Coralyn HellingSood, Vineet, MD  EPINEPHrine (EPIPEN 2-PAK) 0.3 mg/0.3 mL IJ SOAJ injection Inject 0.3 mg into the muscle as needed for anaphylaxis.    [provider]  fluticasone (FLONASE) 50 MCG/ACT nasal spray Place 2 sprays into both nostrils daily. Patient taking differently: Place 2 sprays into both nostrils daily as needed for allergies.  02/16/19   Fisher, Roselyn BeringSusan W, PA-C  furosemide (LASIX) 40 MG tablet Take 1 tablet (40 mg total) by mouth daily as needed for fluid. Patient  taking differently: Take 40 mg by mouth daily as needed for fluid or edema.  01/06/19   Carlean JewsBoscia, Heather E, NP  meloxicam (MOBIC) 15 MG tablet Take 1 tablet (15 mg total) by mouth daily. 11/02/19   Carlean JewsBoscia, Heather E, NP    Allergies Bee venom, Ciprofloxacin, and Penicillins  Family History  Problem Relation Age of Onset  . Hypertension Mother   . Arthritis Mother   . Heart disease Mother   . Heart failure Mother   . Hypertension Father   . Arthritis Father   . Prostate cancer Father   . Heart disease Father   . Heart attack Father   . Arrhythmia Father [id number]       pacemaker implant  . Ovarian cancer Maternal Grandmother     Social History Social History   Tobacco Use  . Smoking status: Current Every Day Smoker    Packs/day: 0.50    Years: 14.00    Pack years: 7.00    Types: Cigarettes  . Smokeless tobacco: Never Used  Vaping Use  . Vaping Use: Never used  Substance Use Topics  . Alcohol use: Yes    Alcohol/week: 0.0 standard drinks    Comment: seldom   . Drug use: No    Review of Systems  Review of Systems  Constitutional: Negative for chills and fever.  HENT: Negative for sore throat.   Eyes: Negative for pain.  Respiratory: Negative for cough and stridor.   Cardiovascular: Negative for chest pain.  Gastrointestinal: Negative for vomiting.  Musculoskeletal: Positive for joint pain ( R 1-3 toes joints) and myalgias ( top of R foot).  Skin: Negative for rash.  Neurological: Negative for seizures, loss of consciousness and headaches.  Psychiatric/Behavioral: Negative for suicidal ideas.  All other systems reviewed and are negative.     ____________________________________________   PHYSICAL EXAM:  VITAL SIGNS: ED Triage Vitals [10/19/20 1724]  Enc Vitals Group     BP 137/77     Pulse Rate 77     Resp 16     Temp 98.6 F (37 C)     Temp Source Oral     SpO2 95 %     Weight 220 lb (99.8 kg)     Height 5\' 7"  (1.702 m)     Head Circumference      Peak  Flow      Pain Score 10     Pain Loc      Pain Edu?      Excl. in GC?    Vitals:   10/19/20 1724  BP: 137/77  Pulse: 77  Resp: 16  Temp: 98.6 F (37 C)  SpO2: 95%   Physical Exam Vitals and nursing note reviewed.  Constitutional:      General: She is not in acute distress.    Appearance: She is well-developed.  HENT:     Head: Normocephalic and atraumatic.     Right Ear: External ear normal.     Left Ear: External ear normal.     Nose: Nose normal.     Mouth/Throat:     Mouth: Mucous membranes are moist.  Eyes:     Conjunctiva/sclera: Conjunctivae normal.  Cardiovascular:     Rate and Rhythm: Normal rate and regular rhythm.     Heart sounds: No murmur heard.   Pulmonary:     Effort: Pulmonary effort is normal. No respiratory distress.     Breath sounds: Normal breath sounds.  Abdominal:     Palpations: Abdomen is soft.     Tenderness: There is no abdominal tenderness.  Musculoskeletal:     Cervical back: Neck supple.     Right lower leg: No edema.     Left lower leg: No edema.  Skin:    General: Skin is warm and dry.  Neurological:     Mental Status: She is alert and oriented to person, place, and time.  Psychiatric:        Mood and Affect: Mood normal.     2+ DP pulses.  Sensation is intact throughout the right foot.  There is no significant effusion deformity or other overlying skin changes over the dorsum or plantar aspect of the right foot.  Patient is able to flex and extend all toes in the right foot and is able to plantar and dorsiflex her right foot against resistance.  Ankle joint is unremarkable.  Patient does have some tenderness over the dorsum of the right foot and at the distal interphalangeal joints of the first and third toes.  There is no plantar midfoot tenderness. ____________________________________________    ____________________________________________  RADIOLOGY  ED MD interpretation: No fracture or dislocation.  Official radiology  report(s): DG Foot Complete Right  Result Date: 10/19/2020 CLINICAL DATA:  Right foot pain after injury EXAM: RIGHT FOOT COMPLETE - 3+ VIEW COMPARISON:  None. FINDINGS: No fracture or dislocation. Small plantar right calcaneal spurs. No suspicious focal osseous lesions. No significant arthropathy. Bipartite medial right first metatarsosesamoid. No radiopaque foreign body. IMPRESSION: No fracture or dislocation in the right foot. Small plantar right calcaneal spurs. Electronically Signed   By: Delbert Phenix M.D.   On: 10/19/2020 18:10    ____________________________________________   PROCEDURES  Procedure(s) performed (including Critical Care):  Procedures   ____________________________________________   INITIAL IMPRESSION / ASSESSMENT AND PLAN / ED COURSE        Patient presents with post history exam for assessment of some right-sided first through third toe pain and foot pain after she struck her foot while walking in her room house yesterday evening.  Denies any other injuries or acute pain.  Afebrile hemodynamically stable on arrival.  Exam as above for patient that is neurovascular intact throughout the foot which has no deformities or effusions but does have some tenderness as is noted.  X-ray shows no evidence of dislocation or fracture.  No historical or exam findings to suggest an acute infectious process.  No findings on history or exam to suggest gout or other nontraumatic arthritides.  Impression is contusion.  Patient is weightbearing and analgesia noted below was given.  Discussed with patient expected clinical course and strict return cautions including any development of numbness tingling or worsening of pain.  Advised patient to wear open toed shoes for the next couple of days and follow-up with her PCP on a routine basis.  Discharge stable condition.  Strict return precautions advised and discussed.   ____________________________________________   FINAL CLINICAL  IMPRESSION(S) / ED DIAGNOSES  Final diagnoses:  Foot injury  Contusion of right foot, initial encounter    Medications  naproxen (NAPROSYN) tablet 500 mg (has no administration in time range)     ED Discharge Orders    None       Note:  This document was prepared using Dragon voice recognition software and may include unintentional dictation errors.   Gilles Chiquito, MD 10/19/20 [phone]

## 2020-10-19 NOTE — ED Notes (Signed)
Pt reports at 0300 this AM she kicked a TV that was on the floor. Pt reports her "toes curled downwards and she heard a pop"  Pt with some swelling to right foot

## 2020-10-19 NOTE — ED Triage Notes (Signed)
Pt  Comes into the ED via POV c/o foot injury to the right foot.  PT states she was walking out of the bathroom last night when she ran her foot into a TV that was sitting on the floor.  Pt is concerned she broke something in her foot because she heard a "Pop".  Pt in NAD at this time.

## 2020-11-07 ENCOUNTER — Other Ambulatory Visit: Payer: Self-pay

## 2020-11-11 ENCOUNTER — Encounter: Payer: Self-pay | Admitting: *Deleted

## 2020-11-11 ENCOUNTER — Ambulatory Visit: Payer: Medicare Other | Admitting: Gastroenterology

## 2021-01-11 ENCOUNTER — Encounter: Payer: Self-pay | Admitting: Emergency Medicine

## 2021-01-11 ENCOUNTER — Other Ambulatory Visit: Payer: Self-pay

## 2021-01-11 ENCOUNTER — Emergency Department
Admission: EM | Admit: 2021-01-11 | Discharge: 2021-01-11 | Disposition: A | Discharge status: Home / Self Care | Payer: Medicare Other | Attending: Emergency Medicine | Admitting: Emergency Medicine

## 2021-01-11 ENCOUNTER — Emergency Department: Payer: Medicare Other

## 2021-01-11 DIAGNOSIS — Z7951 Long term (current) use of inhaled steroids: Secondary | ICD-10-CM | POA: Insufficient documentation

## 2021-01-11 DIAGNOSIS — Z20822 Contact with and (suspected) exposure to covid-19: Secondary | ICD-10-CM | POA: Insufficient documentation

## 2021-01-11 DIAGNOSIS — Z853 Personal history of malignant neoplasm of breast: Secondary | ICD-10-CM | POA: Insufficient documentation

## 2021-01-11 DIAGNOSIS — K2970 Gastritis, unspecified, without bleeding: Secondary | ICD-10-CM

## 2021-01-11 DIAGNOSIS — J449 Chronic obstructive pulmonary disease, unspecified: Secondary | ICD-10-CM | POA: Insufficient documentation

## 2021-01-11 DIAGNOSIS — R1013 Epigastric pain: Secondary | ICD-10-CM | POA: Diagnosis not present

## 2021-01-11 DIAGNOSIS — R112 Nausea with vomiting, unspecified: Secondary | ICD-10-CM | POA: Insufficient documentation

## 2021-01-11 DIAGNOSIS — Z7982 Long term (current) use of aspirin: Secondary | ICD-10-CM | POA: Insufficient documentation

## 2021-01-11 DIAGNOSIS — Z96642 Presence of left artificial hip joint: Secondary | ICD-10-CM | POA: Insufficient documentation

## 2021-01-11 DIAGNOSIS — F17210 Nicotine dependence, cigarettes, uncomplicated: Secondary | ICD-10-CM | POA: Insufficient documentation

## 2021-01-11 DIAGNOSIS — R079 Chest pain, unspecified: Secondary | ICD-10-CM | POA: Insufficient documentation

## 2021-01-11 DIAGNOSIS — J4520 Mild intermittent asthma, uncomplicated: Secondary | ICD-10-CM | POA: Insufficient documentation

## 2021-01-11 LAB — BASIC METABOLIC PANEL
Anion gap: 8 (ref 5–15)
BUN: 12 mg/dL (ref 6–20)
CO2: 28 mmol/L (ref 22–32)
Calcium: 8.9 mg/dL (ref 8.9–10.3)
Chloride: 106 mmol/L (ref 98–111)
Creatinine, Ser: 0.63 mg/dL (ref 0.44–1.00)
GFR, Estimated: >60 mL/min (ref >60)
Glucose, Bld: 95 mg/dL (ref 70–99)
Potassium: 4.4 mmol/L (ref 3.5–5.1)
Sodium: 142 mmol/L (ref 135–145)

## 2021-01-11 LAB — CBC
HCT: 41.7 % (ref 36.0–46.0)
Hemoglobin: 13.9 g/dL (ref 12.0–15.0)
MCH: 32.1 pg (ref 26.0–34.0)
MCHC: 33.3 g/dL (ref 30.0–36.0)
MCV: 96.3 fL (ref 80.0–100.0)
Platelets: 235 10*3/uL (ref 150–400)
RBC: 4.33 MIL/uL (ref 3.87–5.11)
RDW: 13.6 % (ref 11.5–15.5)
WBC: 4.2 10*3/uL (ref 4.0–10.5)
nRBC: 0 % (ref 0.0–0.2)

## 2021-01-11 LAB — HEPATIC FUNCTION PANEL
ALT: 11 U/L (ref 0–44)
AST: 15 U/L (ref 15–41)
Albumin: 4.4 g/dL (ref 3.5–5.0)
Alkaline Phosphatase: 67 U/L (ref 38–126)
Bilirubin, Direct: <0.1 mg/dL (ref 0.0–0.2)
Total Bilirubin: 0.7 mg/dL (ref 0.3–1.2)
Total Protein: 8 g/dL (ref 6.5–8.1)

## 2021-01-11 LAB — TROPONIN I (HIGH SENSITIVITY)
Troponin I (High Sensitivity): 5 ng/L (ref <18)
Troponin I (High Sensitivity): 4 ng/L (ref <18)

## 2021-01-11 LAB — RESP PANEL BY RT-PCR (FLU A&B, COVID) ARPGX2
Influenza A by PCR: NEGATIVE
Influenza B by PCR: NEGATIVE
SARS Coronavirus 2 by RT PCR: NEGATIVE

## 2021-01-11 LAB — LIPASE, BLOOD: Lipase: 32 U/L (ref 11–51)

## 2021-01-11 MED ORDER — GI COCKTAIL ~~LOC~~: ORAL | 0 refills | Status: DC

## 2021-01-11 MED ORDER — SODIUM CHLORIDE 0.9 % IV BOLUS
1000.0000 mL | Freq: Once | INTRAVENOUS | Status: AC
  Administered 2021-01-11: 1000 mL via INTRAVENOUS

## 2021-01-11 MED ORDER — PROMETHAZINE HCL 25 MG/ML IJ SOLN
25.0000 mg | Freq: Once | INTRAMUSCULAR | Status: AC
  Administered 2021-01-11: 25 mg via INTRAVENOUS
  Filled 2021-01-11: qty 1

## 2021-01-11 MED ORDER — ALUM & MAG HYDROXIDE-SIMETH 200-200-20 MG/5ML PO SUSP
30.0000 mL | Freq: Once | ORAL | Status: AC
  Administered 2021-01-11: 30 mL via ORAL
  Filled 2021-01-11: qty 30

## 2021-01-11 MED ORDER — LIDOCAINE VISCOUS HCL 2 % MT SOLN
15.0000 mL | Freq: Once | OROMUCOSAL | Status: AC
  Administered 2021-01-11: 15 mL via ORAL
  Filled 2021-01-11: qty 15

## 2021-01-11 MED ORDER — PANTOPRAZOLE SODIUM 40 MG PO TBEC: 40.0000 mg | DELAYED_RELEASE_TABLET | Freq: Every day | ORAL | 1 refills | Status: DC

## 2021-01-11 NOTE — Discharge Instructions (Signed)
Please take your medication as prescribed.  You may stop taking omeprazole.  Please call the number provided for GI medicine to arrange a follow-up appointment soon as possible.  Return to the emergency department for any return of/worsening discomfort, fever, return of chest pain, or any other symptom personally concerning to yourself.

## 2021-01-11 NOTE — ED Provider Notes (Signed)
[facility] Emergency Department Provider Note  Time seen: 12:17 PM  I have reviewed the triage vital signs and the nursing notes.   HISTORY  Chief Complaint Nausea and Chest Pain   HPI Nicole Wyatt is a 55 y.o. female with a past medical history of anxiety, bipolar, COPD, hyperlipidemia, sarcoidosis, presents to the emergency department for nausea vomiting epigastric pain.  According to the patient over the past 3 weeks she has been experiencing intermittent episodes of nausea vomiting and epigastric discomfort.  Patient describes it as reflux/heartburn but has progressed at times to chest pain in addition to epigastric burning.  Denies any shortness of breath or cough.  No fever.  Patient has tried taking omeprazole without success.   Past Medical History:  Diagnosis Date  . Abnormal Pap smear of cervix   . Anxiety   . Arthritis   . Bipolar 1 disorder (HCC)   . COPD (chronic obstructive pulmonary disease) (HCC)   . Depression   . Dyspnea    with exertion   . History of kidney stones   . Hyperlipidemia   . Oxygen dependent    patient uses 2L oz PRN ; reports hardly ever uses it unless very SOB    . Rocky [place] spotted fever 2018  . Sarcoidosis of lung (HCC)    managed by Dr Lindie Spruce   . Seizures (HCC) last seizure 05/2013  . Sleep apnea    uses CPAP nightly   . UTI (urinary tract infection) 11/13/2018   see ED visit in epic ; was dc'd with oral abx and reports completed and relief of sx     Patient Active Problem List   Diagnosis Date Noted  . Kidney stone 01/05/2020  . Atopic dermatitis 09/08/2019  . 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/08/2019  . Acute cystitis with hematuria 09/08/2019  . Other fatigue 07/19/2019  . Hypoglycemia 07/19/2019  . Arm weakness 03/07/2019  . Influenza A 02/24/2019  . Acute upper respiratory infection 02/24/2019  . Chronic obstructive pulmonary disease (HCC) 02/24/2019  . Acute otitis externa of both ears  01/06/2019  . Mild intermittent asthma without complication 01/06/2019  . Primary osteoarthritis of hip 12/02/2018  . 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 OSA (obstructive sleep apnea) 10/13/2018  . 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 [place] spotted fever 07/02/2018  . Urinary tract infection without hematuria 07/02/2018  . Dysuria 06/23/2018  . Depression, major, recurrent, moderate (HCC) 03/26/2018  . Generalized edema 03/26/2018  . Anaphylactic syndrome 03/26/2018  . Cutaneous candidiasis 03/26/2018  . Bursitis of hip 02/03/2018  . Osteoarthritis of knee 02/03/2018  . Chronic pulmonary hypertension (HCC) 06/18/2016  . S/P cardiac cath 06/18/2016  . Unstable angina (HCC) 05/24/2016  . Bilateral leg edema 05/22/2016  . Shortness of breath 04/30/2016  . Skin lesion of right arm 03/01/2016  . Screening for breast cancer 06/10/2015  . Skin lesion of breast 06/10/2015  . Hx of seizure disorder 06/24/2014  . Left-sided weakness 06/24/2014  . Tobacco abuse 06/24/2014    Past Surgical History:  Procedure Laterality Date  . ABDOMINAL HYSTERECTOMY  1999  . BLADDER SURGERY    . CARDIAC CATHETERIZATION Bilateral 05/29/2016   Procedure: Right/Left Heart Cath and Coronary Angiography;  Surgeon: Lamar Blinks, MD;  Location: [facility] INVASIVE CV LAB;  Service: Cardiovascular;  Laterality: Bilateral;  . COLONOSCOPY    . COLONOSCOPY N/A 07/19/2015   Procedure: COLONOSCOPY;  Surgeon: Earline Mayotte, MD;  Location: [facility] ENDOSCOPY;  Service:  Endoscopy;  Laterality: N/A;  . CYSTOSCOPY WITH STENT PLACEMENT Left 01/05/2020   Procedure: CYSTOSCOPY WITH STENT PLACEMENT;  Surgeon: Riki Altes, MD;  Location: [facility] ORS;  Service: Urology;  Laterality: Left;  . CYSTOSCOPY/URETEROSCOPY/HOLMIUM LASER/STENT PLACEMENT Left 01/26/2020   Procedure: CYSTOSCOPY/URETEROSCOPY/HOLMIUM LASER/STENT Exchange;  Surgeon: Riki Altes, MD;  Location: [facility] ORS;  Service: Urology;  Laterality:  Left;  . FLEXIBLE BRONCHOSCOPY N/A 07/27/2016   Procedure: FLEXIBLE BRONCHOSCOPY;  Surgeon: Yevonne Pax, MD;  Location: [facility] ORS;  Service: Pulmonary;  Laterality: N/A;  . FOOT SURGERY    . JOINT REPLACEMENT Left    hip  . KIDNEY STONE SURGERY  7 years  . TOTAL HIP ARTHROPLASTY Left 12/02/2018   Procedure: TOTAL HIP ARTHROPLASTY ANTERIOR APPROACH;  Surgeon: Sheral Apley, MD;  Location: [facility] ORS;  Service: Orthopedics;  Laterality: Left;  . TUBAL LIGATION      Prior to Admission medications   Medication Sig Start Date End Date Taking? Authorizing Provider  albuterol (VENTOLIN HFA) 108 (90 Base) MCG/ACT inhaler Inhale 2 puffs into the lungs every 6 (six) hours as needed for wheezing or shortness of breath. 08/18/20   Coralyn Helling, MD  amLODipine (NORVASC) 10 MG tablet Take 10 mg by mouth daily. 10/10/20   [provider]  aspirin EC 81 MG tablet Take 1 tablet (81 mg total) by mouth 2 (two) times daily. For DVT prophylaxis for 30 days after surgery. Patient taking differently: Take 81 mg by mouth daily.  12/02/18   Martensen, Lucretia Kern III, PA-C  budesonide-formoterol (SYMBICORT) 160-4.5 MCG/ACT inhaler Inhale 2 puffs into the lungs 2 (two) times daily. 08/18/20   Coralyn Helling, MD  EPINEPHrine (EPIPEN 2-PAK) 0.3 mg/0.3 mL IJ SOAJ injection Inject 0.3 mg into the muscle as needed for anaphylaxis.    [provider]  fluticasone (FLONASE) 50 MCG/ACT nasal spray Place 2 sprays into both nostrils daily. Patient taking differently: Place 2 sprays into both nostrils daily as needed for allergies.  02/16/19   Fisher, Roselyn Bering, PA-C  furosemide (LASIX) 40 MG tablet Take 1 tablet (40 mg total) by mouth daily as needed for fluid. Patient taking differently: Take 40 mg by mouth daily as needed for fluid or edema.  01/06/19   Carlean Jews, NP  hydrochlorothiazide (MICROZIDE) 12.5 MG capsule Take 12.5 mg by mouth daily. 09/02/20   [provider]  meloxicam (MOBIC) 15 MG  tablet Take 1 tablet (15 mg total) by mouth daily. 11/02/19   Carlean Jews, NP  omeprazole (PRILOSEC) 20 MG capsule Take 20 mg by mouth daily. 09/14/20   [provider]  SPIRIVA HANDIHALER 18 MCG inhalation capsule 1 capsule daily. 09/14/20   [provider]  venlafaxine (EFFEXOR) 75 MG tablet Take by mouth. 08/03/20   [provider]    Allergies  Allergen Reactions  . Bee Venom Anaphylaxis  . Ciprofloxacin Nausea Only  . Penicillins Diarrhea and Rash    Did it involve swelling of the face/tongue/throat, SOB, or low BP? No Did it involve sudden or severe rash/hives, skin peeling, or any reaction on the inside of your mouth or nose? No Did you need to seek medical attention at a hospital or doctor's office? No When did it last happen?4-5 years If all above answers are "NO", may proceed with cephalosporin use.      Family History  Problem Relation Age of Onset  . Hypertension Mother   . Arthritis Mother   . Heart disease Mother   .  Heart failure Mother   . Hypertension Father   . Arthritis Father   . Prostate cancer Father   . Heart disease Father   . Heart attack Father   . Arrhythmia Father [id number]       pacemaker implant  . Ovarian cancer Maternal Grandmother     Social History Social History   Tobacco Use  . Smoking status: Current Every Day Smoker    Packs/day: 0.50    Years: 14.00    Pack years: 7.00    Types: Cigarettes  . Smokeless tobacco: Never Used  Vaping Use  . Vaping Use: Never used  Substance Use Topics  . Alcohol use: Yes    Alcohol/week: 0.0 standard drinks    Comment: seldom   . Drug use: No    Review of Systems Constitutional: Negative for fever. Cardiovascular: Mild chest burning at times mostly after vomiting per patient Respiratory: Negative for shortness of breath. Gastrointestinal: Epigastric burning/discomfort mild to moderate.  Positive for nausea vomiting.  Negative for diarrhea. Musculoskeletal:  Negative for musculoskeletal complaints Neurological: Negative for headache All other ROS negative  ____________________________________________   PHYSICAL EXAM:  VITAL SIGNS: ED Triage Vitals  Enc Vitals Group     BP 01/11/21 1014 (!) 142/82     Pulse Rate 01/11/21 1014 73     Resp 01/11/21 1014 18     Temp 01/11/21 1014 98 F (36.7 C)     Temp Source 01/11/21 1014 Oral     SpO2 01/11/21 1014 94 %     Weight 01/11/21 1015 232 lb (105.2 kg)     Height 01/11/21 1015 5\' 6"  (1.676 m)     Head Circumference --      Peak Flow --      Pain Score 01/11/21 1015 8     Pain Loc --      Pain Edu? --      Excl. in GC? --    Constitutional: Alert and oriented. Well appearing and in no distress. Eyes: Normal exam ENT      Head: Normocephalic and atraumatic.      Mouth/Throat: Mucous membranes are moist. Cardiovascular: Normal rate, regular rhythm.  Respiratory: Normal respiratory effort without tachypnea nor retractions. Breath sounds are clear  Gastrointestinal: Soft mild epigastric tenderness to palpation.  No rebound guarding or distention. Musculoskeletal: Nontender with normal range of motion in all extremities. No lower extremity tenderness or edema. Neurologic:  Normal speech and language. No gross focal neurologic deficits are appreciated. Skin:  Skin is warm, dry and intact.  Psychiatric: Mood and affect are normal. Speech and behavior are normal.   ____________________________________________    EKG  EKG viewed and interpreted by myself shows what appears to be sinus rhythm at 62 bpm with a slightly widened QRS, left axis deviation slight PR prolongation otherwise normal intervals with nonspecific ST changes.  ____________________________________________    RADIOLOGY  Chest x-ray is negative  ____________________________________________   INITIAL IMPRESSION / ASSESSMENT AND PLAN / ED COURSE  Pertinent labs & imaging results that were available during my care of  the patient were reviewed by me and considered in my medical decision making (see chart for details).   Patient presents the emergency department for 3 weeks of nausea vomiting epigastric burning and occasional chest burning.  Symptoms are very suggestive of reflux/gastritis/esophagitis.  No history of endoscopy per patient.  Does not drink alcohol.  Denies heavy use of NSAIDs or aspirin.  Patient's basic labs are largely within normal  limits including a normal troponin.  However given the patient's epigastric discomfort nausea vomiting of added on hepatic function panel as well as a lipase.  We will dose nausea medication IV fluids and a GI cocktail and reassess.  Patient agreeable to plan of care.  Patient is feeling better after medications.  The remainder the patient's work-up has been nonrevealing including repeat troponin, hepatic function panel and lipase.  Highly suspect patient's symptoms are due to gastritis and/or esophagitis.  We will discharge with Protonix 40 mg daily GI cocktails to be used as needed and GI follow-up.  Patient agreeable to plan of care and will call tomorrow to make an appointment.  Nicole Wyatt was evaluated in Emergency Department on 01/11/2021 for the symptoms described in the history of present illness. She was evaluated in the context of the global COVID-19 pandemic, which necessitated consideration that the patient might be at risk for infection with the SARS-CoV-2 virus that causes COVID-19. Institutional protocols and algorithms that pertain to the evaluation of patients at risk for COVID-19 are in a state of rapid change based on information released by regulatory bodies including the CDC and federal and state organizations. These policies and algorithms were followed during the patient's care in the ED.  ____________________________________________   FINAL CLINICAL IMPRESSION(S) / ED DIAGNOSES  Nausea vomit Epigastric pain   Minna Antis, MD 01/11/21  1335

## 2021-01-11 NOTE — ED Notes (Signed)
Pt ambulatory to bathroom. Reports improvement in symptoms.

## 2021-01-11 NOTE — ED Notes (Signed)
Pt provided with crackers and ginger ale for PO challenge. ?

## 2021-01-11 NOTE — ED Triage Notes (Signed)
Pt comes into the ED via POV c/o nausea and vomiting that has been ongoing a couple days.  Pt was placed on omeprazole for reflux, but she is still having the vomiting and now sensations of her heart fluttering and diaphoretic.  Pt currently is ambulatory to triage with even and unlabored respirations. Pt states she has a h/o heart problems that include some blockages.  Pt does wear O2 as needed.

## 2021-02-10 ENCOUNTER — Ambulatory Visit: Payer: Medicare Other | Admitting: Cardiology

## 2021-02-13 ENCOUNTER — Encounter: Payer: Self-pay | Admitting: Cardiology

## 2021-02-22 ENCOUNTER — Encounter: Payer: Self-pay | Admitting: Gastroenterology

## 2021-02-22 ENCOUNTER — Ambulatory Visit: Payer: Medicare Other | Admitting: Gastroenterology

## 2021-02-22 ENCOUNTER — Other Ambulatory Visit: Payer: Self-pay

## 2021-02-22 VITALS — BP 125/81 | HR 75 | Temp 98.3°F | Ht 67.0 in | Wt 222.2 lb

## 2021-02-22 DIAGNOSIS — R12 Heartburn: Secondary | ICD-10-CM

## 2021-02-22 DIAGNOSIS — R1013 Epigastric pain: Secondary | ICD-10-CM

## 2021-02-22 MED ORDER — PANTOPRAZOLE SODIUM 40 MG PO TBEC
40.0000 mg | DELAYED_RELEASE_TABLET | Freq: Two times a day (BID) | ORAL | 0 refills | Status: DC
Start: 2021-02-22 — End: 2021-04-26

## 2021-02-22 NOTE — Progress Notes (Signed)
Arlyss Repress, MD [address]  [address]  [place], [place] [address]  Main: [phone]  Fax: [phone]    Gastroenterology Consultation  Referring Provider:     [facility], [facility] Primary Care Physician:  [facility], [facility] Primary Gastroenterologist:  Dr. Arlyss Repress Reason for Consultation:     Heartburn, epigastric pain        HPI:   Nicole Wyatt is a 55 y.o. female referred by Dr. Eli Phillips, [facility]  for consultation & management of more than 2 months history of burning sensation in the chest as well as epigastric burning pain.  Patient went to the ER on 01/11/2021 secondary to nausea, vomiting, she was placed on omeprazole 20 mg daily for reflux, she was still experiencing vomiting.  Cardiac etiology was ruled out in the ER.  She was discharged on Protonix 40 mg daily.  Patient reports that her symptoms of severe burning in the chest associated with epigastric pain is ongoing.  She reports that every time she tries to eat something or even drink water, everything comes back up.  She lost about 10 pounds since January.  She does have history of chronic tobacco use, about 6 cigarettes/day.  Rest of the labs in the ER revealed mild anemia, normal LFTs, lipase.  Alcohol worsens the symptoms CT abdomen and pelvis in 6/21 revealed normal pancreas, mild fatty liver, no evidence of cholelithiasis  NSAIDs: None  Antiplts/Anticoagulants/Anti thrombotics: None  GI Procedures:  Colonoscopy 07/19/2015 - The entire examined colon is normal on direct and retroflexion views. - No specimens collected.  Patient denies family history of GI malignancy  Past Medical History:  Diagnosis Date  . Abnormal Pap smear of cervix   . Anxiety   . Arthritis   . Bipolar 1 disorder (HCC)   . COPD (chronic obstructive pulmonary disease) (HCC)   . Depression   . Dyspnea    with exertion   . History of kidney stones   . Hyperlipidemia   . Oxygen  dependent    patient uses 2L oz PRN ; reports hardly ever uses it unless very SOB    . Rocky Mountain spotted fever 2018  . Sarcoidosis of lung (HCC)    managed by Dr Lindie Spruce   . Seizures (HCC) last seizure 05/2013  . Sleep apnea    uses CPAP nightly   . UTI (urinary tract infection) 11/13/2018   see ED visit in epic ; was dc'd with oral abx and reports completed and relief of sx     Past Surgical History:  Procedure Laterality Date  . ABDOMINAL HYSTERECTOMY  1999  . BLADDER SURGERY    . CARDIAC CATHETERIZATION Bilateral 05/29/2016   Procedure: Right/Left Heart Cath and Coronary Angiography;  Surgeon: Lamar Blinks, MD;  Location: [facility] INVASIVE CV LAB;  Service: Cardiovascular;  Laterality: Bilateral;  . COLONOSCOPY    . COLONOSCOPY N/A 07/19/2015   Procedure: COLONOSCOPY;  Surgeon: Earline Mayotte, MD;  Location: [facility] ENDOSCOPY;  Service: Endoscopy;  Laterality: N/A;  . CYSTOSCOPY WITH STENT PLACEMENT Left 01/05/2020   Procedure: CYSTOSCOPY WITH STENT PLACEMENT;  Surgeon: Riki Altes, MD;  Location: [facility] ORS;  Service: Urology;  Laterality: Left;  . CYSTOSCOPY/URETEROSCOPY/HOLMIUM LASER/STENT PLACEMENT Left 01/26/2020   Procedure: CYSTOSCOPY/URETEROSCOPY/HOLMIUM LASER/STENT Exchange;  Surgeon: Riki Altes, MD;  Location: [facility] ORS;  Service: Urology;  Laterality: Left;  . FLEXIBLE BRONCHOSCOPY N/A 07/27/2016   Procedure: FLEXIBLE BRONCHOSCOPY;  Surgeon: Yevonne Pax, MD;  Location: [facility] ORS;  Service: Pulmonary;  Laterality: N/A;  . FOOT SURGERY    . JOINT REPLACEMENT Left    hip  . KIDNEY STONE SURGERY  7 years  . TOTAL HIP ARTHROPLASTY Left 12/02/2018   Procedure: TOTAL HIP ARTHROPLASTY ANTERIOR APPROACH;  Surgeon: Sheral Apley, MD;  Location: [facility] ORS;  Service: Orthopedics;  Laterality: Left;  . TUBAL LIGATION     Current Outpatient Medications:  .  albuterol (VENTOLIN HFA) 108 (90 Base) MCG/ACT inhaler, Inhale 2 puffs into the lungs every 6 (six) hours as needed for  wheezing or shortness of breath., Disp: 6.7 g, Rfl: 0 .  Alum & Mag Hydroxide-Simeth (GI COCKTAIL) SUSP suspension, Shake well. Each dose to containe 15mL maalox and 15mL viscous lidocaine, Disp: 300 mL, Rfl: 0 .  amLODipine (NORVASC) 10 MG tablet, Take 10 mg by mouth daily., Disp: , Rfl:  .  aspirin EC 81 MG tablet, Take 1 tablet (81 mg total) by mouth 2 (two) times daily. For DVT prophylaxis for 30 days after surgery. (Patient taking differently: Take 81 mg by mouth daily.), Disp: 60 tablet, Rfl: 0 .  budesonide-formoterol (SYMBICORT) 160-4.5 MCG/ACT inhaler, Inhale 2 puffs into the lungs 2 (two) times daily., Disp: 1 each, Rfl: 6 .  EPINEPHrine 0.3 mg/0.3 mL IJ SOAJ injection, Inject 0.3 mg into the muscle as needed for anaphylaxis., Disp: , Rfl:  .  fluticasone (FLONASE) 50 MCG/ACT nasal spray, Place 2 sprays into both nostrils daily. (Patient taking differently: Place 2 sprays into both nostrils daily as needed for allergies.), Disp: 16 g, Rfl: 2 .  furosemide (LASIX) 40 MG tablet, Take 1 tablet (40 mg total) by mouth daily as needed for fluid. (Patient taking differently: Take 40 mg by mouth daily as needed for fluid or edema.), Disp: 30 tablet, Rfl: 5 .  hydrochlorothiazide (MICROZIDE) 12.5 MG capsule, Take 12.5 mg by mouth daily., Disp: , Rfl:  .  meloxicam (MOBIC) 15 MG tablet, Take 1 tablet (15 mg total) by mouth daily., Disp: 30 tablet, Rfl: 0 .  SPIRIVA HANDIHALER 18 MCG inhalation capsule, 1 capsule daily., Disp: , Rfl:  .  venlafaxine (EFFEXOR) 75 MG tablet, Take by mouth., Disp: , Rfl:  .  pantoprazole (PROTONIX) 40 MG tablet, Take 1 tablet (40 mg total) by mouth 2 (two) times daily., Disp: 60 tablet, Rfl: 0    Family History  Problem Relation Age of Onset  . Hypertension Mother   . Arthritis Mother   . Heart disease Mother   . Heart failure Mother   . Hypertension Father   . Arthritis Father   . Prostate cancer Father   . Heart disease Father   . Heart attack Father   .  Arrhythmia Father 18       pacemaker implant  . Ovarian cancer Maternal Grandmother      Social History   Tobacco Use  . Smoking status: Current Every Day Smoker    Packs/day: 0.50    Years: 14.00    Pack years: 7.00    Types: Cigarettes  . Smokeless tobacco: Never Used  Vaping Use  . Vaping Use: Never used  Substance Use Topics  . Alcohol use: Yes    Alcohol/week: 0.0 standard drinks    Comment: seldom   . Drug use: No    Allergies as of 02/22/2021 - Review Complete 02/22/2021  Allergen Reaction Noted  . Bee venom Anaphylaxis 05/29/2016  . Ciprofloxacin Nausea Only 06/23/2018  . Penicillins Diarrhea and Rash  07/26/2016    Review of Systems:    All systems reviewed and negative except where noted in HPI.   Physical Exam:  BP 125/81 (BP Location: Left Arm, Patient Position: Sitting, Cuff Size: Normal)   Pulse 75   Temp 98.3 F (36.8 C) (Oral)   Ht 5\' 7"  (1.702 m)   Wt 222 lb 4 oz (100.8 kg)   BMI 34.81 kg/m  No LMP recorded. Patient has had a hysterectomy.  General:   Alert,  Well-developed, well-nourished, pleasant and cooperative in NAD Head:  Normocephalic and atraumatic. Eyes:  Sclera clear, no icterus.   Conjunctiva pink. Ears:  Normal auditory acuity. Nose:  No deformity, discharge, or lesions. Mouth:  No deformity or lesions,oropharynx pink & moist. Neck:  Supple; no masses or thyromegaly. Lungs:  Respirations even and unlabored.  Clear throughout to auscultation.   No wheezes, crackles, or rhonchi. No acute distress. Heart:  Regular rate and rhythm; no murmurs, clicks, rubs, or gallops. Abdomen:  Normal bowel sounds. Soft, mild epigastric tenderness and non-distended without masses, hepatosplenomegaly or hernias noted.  No guarding or rebound tenderness.   Rectal: Not performed Msk:  Symmetrical without gross deformities. Good, equal movement & strength bilaterally. Pulses:  Normal pulses noted. Extremities:  No clubbing or edema.  No  cyanosis. Neurologic:  Alert and oriented x3;  grossly normal neurologically. Skin:  Intact without significant lesions or rashes. No jaundice. Psych:  Alert and cooperative. Normal mood and affect.  Imaging Studies: Reviewed  Assessment and Plan:   Nicole Wyatt is a 55 y.o. African-American female with BMI 34.8, chronic tobacco use is seen in consultation for 2 months history of severe heartburn, regurgitation and epigastric burning pain associated with nausea and vomiting, poor p.o. intake and weight loss  Recommend H. pylori breath test today Recheck LFTs Increase Protonix to 40 mg p.o. twice daily before meals Recommend upper endoscopy for further evaluation, if this is negative, recommend right upper quadrant ultrasound to evaluate for gallbladder etiology   Follow up in 2 to 3 months   59, MD

## 2021-02-24 LAB — H. PYLORI BREATH TEST: H pylori Breath Test: NEGATIVE

## 2021-02-24 LAB — HEPATIC FUNCTION PANEL
ALT: 13 IU/L (ref 0–32)
AST: 16 IU/L (ref 0–40)
Albumin: 4.6 g/dL (ref 3.8–4.9)
Alkaline Phosphatase: 92 IU/L (ref 44–121)
Bilirubin Total: 0.4 mg/dL (ref 0.0–1.2)
Bilirubin, Direct: 0.13 mg/dL (ref 0.00–0.40)
Total Protein: 7.7 g/dL (ref 6.0–8.5)

## 2021-02-27 ENCOUNTER — Other Ambulatory Visit
Admission: RE | Admit: 2021-02-27 | Discharge: 2021-02-27 | Disposition: A | Discharge status: Home / Self Care | Payer: Medicare Other | Source: Ambulatory Visit | Attending: Gastroenterology | Admitting: Gastroenterology

## 2021-02-27 ENCOUNTER — Other Ambulatory Visit: Payer: Self-pay

## 2021-02-27 DIAGNOSIS — Z20822 Contact with and (suspected) exposure to covid-19: Secondary | ICD-10-CM | POA: Diagnosis not present

## 2021-02-27 DIAGNOSIS — Z01812 Encounter for preprocedural laboratory examination: Secondary | ICD-10-CM | POA: Diagnosis present

## 2021-02-28 LAB — SARS CORONAVIRUS 2 (TAT 6-24 HRS): SARS Coronavirus 2: NEGATIVE

## 2021-03-01 ENCOUNTER — Encounter: Payer: Self-pay | Admitting: Gastroenterology

## 2021-03-01 ENCOUNTER — Other Ambulatory Visit: Payer: Self-pay

## 2021-03-01 ENCOUNTER — Ambulatory Visit: Payer: Medicare Other | Admitting: Anesthesiology

## 2021-03-01 ENCOUNTER — Encounter
Admission: RE | Disposition: A | Discharge status: Home / Self Care | Payer: Self-pay | Source: Ambulatory Visit | Attending: Gastroenterology

## 2021-03-01 ENCOUNTER — Ambulatory Visit
Admission: RE | Admit: 2021-03-01 | Discharge: 2021-03-01 | Disposition: A | Discharge status: Home / Self Care | Payer: Medicare Other | Source: Ambulatory Visit | Attending: Gastroenterology | Admitting: Gastroenterology

## 2021-03-01 DIAGNOSIS — Z9981 Dependence on supplemental oxygen: Secondary | ICD-10-CM | POA: Diagnosis not present

## 2021-03-01 DIAGNOSIS — Z8041 Family history of malignant neoplasm of ovary: Secondary | ICD-10-CM | POA: Diagnosis not present

## 2021-03-01 DIAGNOSIS — K209 Esophagitis, unspecified without bleeding: Secondary | ICD-10-CM | POA: Insufficient documentation

## 2021-03-01 DIAGNOSIS — F17210 Nicotine dependence, cigarettes, uncomplicated: Secondary | ICD-10-CM | POA: Diagnosis not present

## 2021-03-01 DIAGNOSIS — Z8249 Family history of ischemic heart disease and other diseases of the circulatory system: Secondary | ICD-10-CM | POA: Insufficient documentation

## 2021-03-01 DIAGNOSIS — Z91030 Bee allergy status: Secondary | ICD-10-CM | POA: Insufficient documentation

## 2021-03-01 DIAGNOSIS — D869 Sarcoidosis, unspecified: Secondary | ICD-10-CM | POA: Insufficient documentation

## 2021-03-01 DIAGNOSIS — Z7982 Long term (current) use of aspirin: Secondary | ICD-10-CM | POA: Insufficient documentation

## 2021-03-01 DIAGNOSIS — Z8042 Family history of malignant neoplasm of prostate: Secondary | ICD-10-CM | POA: Insufficient documentation

## 2021-03-01 DIAGNOSIS — Z881 Allergy status to other antibiotic agents status: Secondary | ICD-10-CM | POA: Diagnosis not present

## 2021-03-01 DIAGNOSIS — Z79899 Other long term (current) drug therapy: Secondary | ICD-10-CM | POA: Insufficient documentation

## 2021-03-01 DIAGNOSIS — R1013 Epigastric pain: Secondary | ICD-10-CM

## 2021-03-01 DIAGNOSIS — R12 Heartburn: Secondary | ICD-10-CM

## 2021-03-01 DIAGNOSIS — J449 Chronic obstructive pulmonary disease, unspecified: Secondary | ICD-10-CM | POA: Insufficient documentation

## 2021-03-01 DIAGNOSIS — Z8261 Family history of arthritis: Secondary | ICD-10-CM | POA: Insufficient documentation

## 2021-03-01 DIAGNOSIS — F319 Bipolar disorder, unspecified: Secondary | ICD-10-CM | POA: Diagnosis not present

## 2021-03-01 HISTORY — PX: ESOPHAGOGASTRODUODENOSCOPY (EGD) WITH PROPOFOL: SHX5813

## 2021-03-01 SURGERY — ESOPHAGOGASTRODUODENOSCOPY (EGD) WITH PROPOFOL
Anesthesia: General

## 2021-03-01 MED ORDER — MIDAZOLAM HCL 5 MG/5ML IJ SOLN
INTRAMUSCULAR | Status: DC | PRN
  Administered 2021-03-01: 2 mg via INTRAVENOUS

## 2021-03-01 MED ORDER — PROPOFOL 500 MG/50ML IV EMUL
INTRAVENOUS | Status: AC
  Filled 2021-03-01: qty 50

## 2021-03-01 MED ORDER — LIDOCAINE 2% (20 MG/ML) 5 ML SYRINGE
INTRAMUSCULAR | Status: DC | PRN
  Administered 2021-03-01: 25 mg via INTRAVENOUS

## 2021-03-01 MED ORDER — SODIUM CHLORIDE 0.9 % IV SOLN: INTRAVENOUS | Status: DC

## 2021-03-01 MED ORDER — MIDAZOLAM HCL 2 MG/2ML IJ SOLN
INTRAMUSCULAR | Status: AC
  Filled 2021-03-01: qty 2

## 2021-03-01 MED ORDER — GLYCOPYRROLATE 0.2 MG/ML IJ SOLN
INTRAMUSCULAR | Status: DC | PRN
  Administered 2021-03-01: .2 mg via INTRAVENOUS

## 2021-03-01 MED ORDER — PROPOFOL 10 MG/ML IV BOLUS
INTRAVENOUS | Status: DC | PRN
  Administered 2021-03-01: 100 mg via INTRAVENOUS

## 2021-03-01 MED ORDER — PROPOFOL 500 MG/50ML IV EMUL
INTRAVENOUS | Status: DC | PRN
  Administered 2021-03-01: 120 ug/kg/min via INTRAVENOUS

## 2021-03-01 NOTE — Op Note (Signed)
[facility] Gastroenterology Patient Name: Nicole Wyatt Procedure Date: 03/01/2021 8:46 AM MRN: [id number] Account #: [id number] Date of Birth: October 30, 1966 Admit Type: Outpatient Age: 55 Room: [facility] ENDO ROOM 4 Gender: Female Note Status: Finalized Procedure:             Upper GI endoscopy Indications:           Epigastric abdominal pain, Heartburn Providers:             Toney Reil MD, MD Medicines:             General Anesthesia Complications:         No immediate complications. Estimated blood loss: None. Procedure:             Pre-Anesthesia Assessment:                        - Prior to the procedure, a History and Physical was                         performed, and patient medications and allergies were                         reviewed. The patient is competent. The risks and                         benefits of the procedure and the sedation options and                         risks were discussed with the patient. All questions                         were answered and informed consent was obtained.                         Patient identification and proposed procedure were                         verified by the physician, the nurse, the                         anesthesiologist, the anesthetist and the technician                         in the pre-procedure area in the procedure room in the                         endoscopy suite. Mental Status Examination: alert and                         oriented. Airway Examination: normal oropharyngeal                         airway and neck mobility. Respiratory Examination:                         clear to auscultation. CV Examination: normal.                         Prophylactic Antibiotics: The patient does not  require                         prophylactic antibiotics. Prior Anticoagulants: The                         patient has taken no previous anticoagulant or                         antiplatelet agents. ASA  Grade Assessment: III - A                         patient with severe systemic disease. After reviewing                         the risks and benefits, the patient was deemed in                         satisfactory condition to undergo the procedure. The                         anesthesia plan was to use general anesthesia.                         Immediately prior to administration of medications,                         the patient was re-assessed for adequacy to receive                         sedatives. The heart rate, respiratory rate, oxygen                         saturations, blood pressure, adequacy of pulmonary                         ventilation, and response to care were monitored                         throughout the procedure. The physical status of the                         patient was re-assessed after the procedure.                        After obtaining informed consent, the endoscope was                         passed under direct vision. Throughout the procedure,                         the patient's blood pressure, pulse, and oxygen                         saturations were monitored continuously. The Endoscope                         was introduced through the mouth, and advanced to the  second part of duodenum. The upper GI endoscopy was                         accomplished without difficulty. The patient tolerated                         the procedure well. Findings:      The duodenal bulb and second portion of the duodenum were normal.      The entire examined stomach was normal. Biopsies were taken with a cold       forceps for Helicobacter pylori testing.      Esophagogastric landmarks were identified: the gastroesophageal junction       was found at 39 cm from the incisors.      LA Grade C (one or more mucosal breaks continuous between tops of 2 or       more mucosal folds, less than 75% circumference) esophagitis with no       bleeding  was found in the lower third of the esophagus, likely secondary       to ETOH use. Impression:            - Normal duodenal bulb and second portion of the                         duodenum.                        - Normal stomach. Biopsied.                        - Esophagogastric landmarks identified.                        - LA Grade C acute esophagitis with no bleeding. Recommendation:        - Discharge patient to home (with escort).                        - Resume previous diet today.                        - Continue present medications.                        - Follow an antireflux regimen.                        - Await pathology results.                        - Repeat upper endoscopy in 3 months to check healing. Procedure Code(s):     --- Professional ---                        [phone], Esophagogastroduodenoscopy, flexible,                         transoral; with biopsy, single or multiple Diagnosis Code(s):     --- Professional ---                        R10.13, Epigastric pain  K20.90, Esophagitis, unspecified without bleeding                        R12, Heartburn CPT copyright 2019 American Medical Association. All rights reserved. The codes documented in this report are preliminary and upon coder review may  be revised to meet current compliance requirements. Dr. Libby Maw Toney Reil MD, MD 03/01/2021 9:10:11 AM This report has been signed electronically. Number of Addenda: 0 Note Initiated On: 03/01/2021 8:46 AM Estimated Blood Loss:  Estimated blood loss: none.      [facility]

## 2021-03-01 NOTE — Anesthesia Postprocedure Evaluation (Signed)
Anesthesia Post Note  Patient: Nicole Wyatt  Procedure(s) Performed: ESOPHAGOGASTRODUODENOSCOPY (EGD) WITH PROPOFOL (N/A )  Patient location during evaluation: Endoscopy Anesthesia Type: General Level of consciousness: awake and alert and oriented Pain management: pain level controlled Vital Signs Assessment: post-procedure vital signs reviewed and stable Respiratory status: spontaneous breathing, nonlabored ventilation and respiratory function stable Cardiovascular status: blood pressure returned to baseline and stable Postop Assessment: no signs of nausea or vomiting Anesthetic complications: no   No complications documented.   Last Vitals:  Vitals:   03/01/21 0920 03/01/21 0930  BP: 103/62 128/80  Pulse: 70 72  Resp: 16 14  Temp:    SpO2: 97% 98%    Last Pain:  Vitals:   03/01/21 0738  TempSrc: Temporal  PainSc: 0-No pain                 Kammy Klett

## 2021-03-01 NOTE — H&P (Signed)
Arlyss Repress, MD [address]  [address]  [place], [place] [address]  Main: [phone]  Fax: [phone] Pager: [phone]  Primary Care Physician:  [facility] Primary Gastroenterologist:  Dr. Arlyss Repress  Pre-Procedure History & Physical: HPI:  Nicole Wyatt is a 55 y.o. female is here for an endoscopy.   Past Medical History:  Diagnosis Date  . Abnormal Pap smear of cervix   . Anxiety   . Arthritis   . Bipolar 1 disorder (HCC)   . COPD (chronic obstructive pulmonary disease) (HCC)   . Depression   . Dyspnea    with exertion   . History of kidney stones   . Hyperlipidemia   . Oxygen dependent    patient uses 2L oz PRN ; reports hardly ever uses it unless very SOB    . [place] spotted fever 2018  . Sarcoidosis of lung (HCC)    managed by Dr Lindie Spruce   . Seizures (HCC) last seizure 05/2013  . Sleep apnea    uses CPAP nightly   . UTI (urinary tract infection) 11/13/2018   see ED visit in epic ; was dc'd with oral abx and reports completed and relief of sx     Past Surgical History:  Procedure Laterality Date  . ABDOMINAL HYSTERECTOMY  1999  . BLADDER SURGERY    . CARDIAC CATHETERIZATION Bilateral 05/29/2016   Procedure: Right/Left Heart Cath and Coronary Angiography;  Surgeon: Lamar Blinks, MD;  Location: [facility] INVASIVE CV LAB;  Service: Cardiovascular;  Laterality: Bilateral;  . COLONOSCOPY    . COLONOSCOPY N/A 07/19/2015   Procedure: COLONOSCOPY;  Surgeon: Earline Mayotte, MD;  Location: [facility] ENDOSCOPY;  Service: Endoscopy;  Laterality: N/A;  . CYSTOSCOPY WITH STENT PLACEMENT Left 01/05/2020   Procedure: CYSTOSCOPY WITH STENT PLACEMENT;  Surgeon: Riki Altes, MD;  Location: [facility] ORS;  Service: Urology;  Laterality: Left;  . CYSTOSCOPY/URETEROSCOPY/HOLMIUM LASER/STENT PLACEMENT Left 01/26/2020   Procedure: CYSTOSCOPY/URETEROSCOPY/HOLMIUM LASER/STENT Exchange;  Surgeon: Riki Altes, MD;  Location: [facility] ORS;  Service:  Urology;  Laterality: Left;  . FLEXIBLE BRONCHOSCOPY N/A 07/27/2016   Procedure: FLEXIBLE BRONCHOSCOPY;  Surgeon: Yevonne Pax, MD;  Location: [facility] ORS;  Service: Pulmonary;  Laterality: N/A;  . FOOT SURGERY    . JOINT REPLACEMENT Left    hip  . KIDNEY STONE SURGERY  7 years  . TOTAL HIP ARTHROPLASTY Left 12/02/2018   Procedure: TOTAL HIP ARTHROPLASTY ANTERIOR APPROACH;  Surgeon: Sheral Apley, MD;  Location: [facility] ORS;  Service: Orthopedics;  Laterality: Left;  . TUBAL LIGATION      Prior to Admission medications   Medication Sig Start Date End Date Taking? Authorizing Provider  amLODipine (NORVASC) 10 MG tablet Take 10 mg by mouth daily. 10/10/20  Yes [provider]  aspirin EC 81 MG tablet Take 1 tablet (81 mg total) by mouth 2 (two) times daily. For DVT prophylaxis for 30 days after surgery. Patient taking differently: Take 81 mg by mouth daily. 12/02/18  Yes Martensen, Lucretia Kern III, PA-C  budesonide-formoterol (SYMBICORT) 160-4.5 MCG/ACT inhaler Inhale 2 puffs into the lungs 2 (two) times daily. 08/18/20  Yes Coralyn Helling, MD  hydrochlorothiazide (MICROZIDE) 12.5 MG capsule Take 12.5 mg by mouth daily. 09/02/20  Yes [provider]  meloxicam (MOBIC) 15 MG tablet Take 1 tablet (15 mg total) by mouth daily. 11/02/19  Yes Boscia, Heather E, NP  pantoprazole (PROTONIX) 40 MG tablet Take 1 tablet (40 mg total) by  mouth 2 (two) times daily. 02/22/21 03/24/21 Yes Ronette Hank, Loel Dubonnet, MD  SPIRIVA HANDIHALER 18 MCG inhalation capsule 1 capsule daily. 09/14/20  Yes [provider]  venlafaxine (EFFEXOR) 75 MG tablet Take by mouth. 08/03/20  Yes [provider]  albuterol (VENTOLIN HFA) 108 (90 Base) MCG/ACT inhaler Inhale 2 puffs into the lungs every 6 (six) hours as needed for wheezing or shortness of breath. 08/18/20   Coralyn Helling, MD  Alum & Mag Hydroxide-Simeth (GI COCKTAIL) SUSP suspension Shake well. Each dose to containe 105mL maalox and 67mL viscous  lidocaine 01/11/21   Minna Antis, MD  EPINEPHrine 0.3 mg/0.3 mL IJ SOAJ injection Inject 0.3 mg into the muscle as needed for anaphylaxis.    [provider]  fluticasone (FLONASE) 50 MCG/ACT nasal spray Place 2 sprays into both nostrils daily. Patient taking differently: Place 2 sprays into both nostrils daily as needed for allergies. 02/16/19   Fisher, Roselyn Bering, PA-C  furosemide (LASIX) 40 MG tablet Take 1 tablet (40 mg total) by mouth daily as needed for fluid. Patient taking differently: Take 40 mg by mouth daily as needed for fluid or edema. 01/06/19   Carlean Jews, NP    Allergies as of 02/22/2021 - Review Complete 02/22/2021  Allergen Reaction Noted  . Bee venom Anaphylaxis 05/29/2016  . Ciprofloxacin Nausea Only 06/23/2018  . Penicillins Diarrhea and Rash 07/26/2016    Family History  Problem Relation Age of Onset  . Hypertension Mother   . Arthritis Mother   . Heart disease Mother   . Heart failure Mother   . Hypertension Father   . Arthritis Father   . Prostate cancer Father   . Heart disease Father   . Heart attack Father   . Arrhythmia Father 85       pacemaker implant  . Ovarian cancer Maternal Grandmother     Social History   Socioeconomic History  . Marital status: Single    Spouse name: Not on file  . Number of children: Not on file  . Years of education: Not on file  . Highest education level: Not on file  Occupational History  . Not on file  Tobacco Use  . Smoking status: Current Every Day Smoker    Packs/day: 0.50    Years: 14.00    Pack years: 7.00    Types: Cigarettes  . Smokeless tobacco: Never Used  Vaping Use  . Vaping Use: Never used  Substance and Sexual Activity  . Alcohol use: Yes    Alcohol/week: 0.0 standard drinks    Comment: seldom   . Drug use: No  . Sexual activity: Never  Other Topics Concern  . Not on file  Social History Narrative  . Not on file   Social Determinants of Health   Financial Resource  Strain: Not on file  Food Insecurity: Not on file  Transportation Needs: Not on file  Physical Activity: Not on file  Stress: Not on file  Social Connections: Not on file  Intimate Partner Violence: Not on file    Review of Systems: See HPI, otherwise negative ROS  Physical Exam: BP 123/71   Pulse 69   Temp (!) 97.5 F (36.4 C) (Temporal)   Resp 18   Ht 5\' 7"  (1.702 m)   Wt 100.7 kg   SpO2 100%   BMI 34.77 kg/m  General:   Alert,  pleasant and cooperative in NAD Head:  Normocephalic and atraumatic. Neck:  Supple; no masses or thyromegaly. Lungs:  Clear throughout to auscultation.    Heart:  Regular rate and rhythm. Abdomen:  Soft, nontender and nondistended. Normal bowel sounds, without guarding, and without rebound.   Neurologic:  Alert and  oriented x4;  grossly normal neurologically.  Impression/Plan: Nicole Wyatt is here for an endoscopy to be performed for epigastric pain  Risks, benefits, limitations, and alternatives regarding  endoscopy have been reviewed with the patient.  Questions have been answered.  All parties agreeable.   Lannette Donath, MD  03/01/2021, 8:23 AM

## 2021-03-01 NOTE — Transfer of Care (Signed)
Immediate Anesthesia Transfer of Care Note  Patient: Nicole Wyatt  Procedure(s) Performed: ESOPHAGOGASTRODUODENOSCOPY (EGD) WITH PROPOFOL (N/A )  Patient Location: Endoscopy Unit  Anesthesia Type:General  Level of Consciousness: sedated  Airway & Oxygen Therapy: Patient Spontanous Breathing  Post-op Assessment: Report given to RN and Post -op Vital signs reviewed and stable  Post vital signs: Reviewed  Last Vitals:  Vitals Value Taken Time  BP 123/75 03/01/21 0910  Temp    Pulse 71 03/01/21 0910  Resp 26 03/01/21 0910  SpO2 98 % 03/01/21 0910    Last Pain:  Vitals:   03/01/21 0738  TempSrc: Temporal  PainSc: 0-No pain         Complications: No complications documented.

## 2021-03-01 NOTE — Anesthesia Preprocedure Evaluation (Signed)
Anesthesia Evaluation  Patient identified by MRN, date of birth, ID band Patient awake    Reviewed: Allergy & Precautions, NPO status , Patient's Chart, lab work & pertinent test results  History of Anesthesia Complications Negative for: history of anesthetic complications  Airway Mallampati: II  TM Distance: >3 FB Neck ROM: Full    Dental  (+) Poor Dentition, Missing   Pulmonary asthma , sleep apnea , COPD (occasional O2 use),  COPD inhaler, Current Smoker and Patient abstained from smoking.,    breath sounds clear to auscultation- rhonchi (-) wheezing      Cardiovascular Exercise Tolerance: Good (-) hypertension(-) CAD, (-) Past MI, (-) Cardiac Stents and (-) CABG  Rhythm:Regular Rate:Normal - Systolic murmurs and - Diastolic murmurs    Neuro/Psych Seizures - (no recent seizures, taken off medication years ago),  PSYCHIATRIC DISORDERS Anxiety Depression Bipolar Disorder    GI/Hepatic negative GI ROS, Neg liver ROS,   Endo/Other  negative endocrine ROSneg diabetes  Renal/GU Renal disease: hx of nephrolithiasis.     Musculoskeletal  (+) Arthritis ,   Abdominal (+) + obese,   Peds  Hematology negative hematology ROS (+)   Anesthesia Other Findings Past Medical History: No date: Abnormal Pap smear of cervix No date: Anxiety No date: Arthritis No date: Bipolar 1 disorder (HCC) No date: COPD (chronic obstructive pulmonary disease) (HCC) No date: Depression No date: Dyspnea     Comment:  with exertion  No date: History of kidney stones No date: Hyperlipidemia No date: Oxygen dependent     Comment:  patient uses 2L oz PRN ; reports hardly ever uses it               unless very SOB   2018: [facility] spotted fever No date: Sarcoidosis of lung (HCC)     Comment:  managed by Dr Lindie Spruce  last seizure 05/2013: Seizures (HCC) No date: Sleep apnea     Comment:  uses CPAP nightly  11/13/2018: UTI (urinary tract  infection)     Comment:  see ED visit in epic ; was dc'd with oral abx and               reports completed and relief of sx    Reproductive/Obstetrics                             Anesthesia Physical Anesthesia Plan  ASA: III  Anesthesia Plan: General   Post-op Pain Management:    Induction: Intravenous  PONV Risk Score and Plan: 1 and Propofol infusion  Airway Management Planned: Natural Airway  Additional Equipment:   Intra-op Plan:   Post-operative Plan:   Informed Consent: I have reviewed the patients History and Physical, chart, labs and discussed the procedure including the risks, benefits and alternatives for the proposed anesthesia with the patient or authorized representative who has indicated his/her understanding and acceptance.     Dental advisory given  Plan Discussed with: CRNA and Anesthesiologist  Anesthesia Plan Comments:         Anesthesia Quick Evaluation

## 2021-03-02 LAB — SURGICAL PATHOLOGY

## 2021-03-03 ENCOUNTER — Encounter: Payer: Self-pay | Admitting: Gastroenterology

## 2021-03-06 ENCOUNTER — Telehealth: Payer: Self-pay

## 2021-03-06 DIAGNOSIS — R1011 Right upper quadrant pain: Secondary | ICD-10-CM

## 2021-03-06 DIAGNOSIS — R1013 Epigastric pain: Secondary | ICD-10-CM

## 2021-03-06 NOTE — Telephone Encounter (Signed)
Patient verbalized understanding of results. She states she can do ultrasound anytime. She states when I call her I can leave her a detail   March 28 at 8:30am please arrive at 8:00am to medical mall nothing to eat or drink after midnight.   Informed patient of this information and she verbalized understanding

## 2021-03-06 NOTE — Telephone Encounter (Signed)
-----   Message from Toney Reil, MD sent at 03/03/2021  3:34 PM EST ----- Pathology results from upper endoscopy came back normal.  Recommend right upper quadrant ultrasound to evaluate for cholelithiasis Dx: Right upper quadrant pain

## 2021-03-20 ENCOUNTER — Ambulatory Visit: Payer: Medicare Other | Attending: Gastroenterology

## 2021-03-20 ENCOUNTER — Telehealth: Payer: Self-pay | Admitting: Gastroenterology

## 2021-03-20 NOTE — Telephone Encounter (Signed)
Patient called and stated that she overslept and missed her appointment today.  Patient would like to reschedule and stated that date and time of appointment can be left on her voicemail.

## 2021-04-16 ENCOUNTER — Other Ambulatory Visit: Payer: Self-pay

## 2021-04-16 ENCOUNTER — Emergency Department
Admission: EM | Admit: 2021-04-16 | Discharge: 2021-04-17 | Disposition: A | Discharge status: Home / Self Care | Payer: Medicare Other | Attending: Emergency Medicine | Admitting: Emergency Medicine

## 2021-04-16 ENCOUNTER — Emergency Department: Payer: Medicare Other

## 2021-04-16 DIAGNOSIS — Z20822 Contact with and (suspected) exposure to covid-19: Secondary | ICD-10-CM | POA: Diagnosis not present

## 2021-04-16 DIAGNOSIS — R042 Hemoptysis: Secondary | ICD-10-CM

## 2021-04-16 DIAGNOSIS — R6883 Chills (without fever): Secondary | ICD-10-CM | POA: Insufficient documentation

## 2021-04-16 DIAGNOSIS — R5383 Other fatigue: Secondary | ICD-10-CM | POA: Diagnosis not present

## 2021-04-16 DIAGNOSIS — R109 Unspecified abdominal pain: Secondary | ICD-10-CM | POA: Diagnosis not present

## 2021-04-16 DIAGNOSIS — R110 Nausea: Secondary | ICD-10-CM | POA: Insufficient documentation

## 2021-04-16 DIAGNOSIS — J189 Pneumonia, unspecified organism: Secondary | ICD-10-CM

## 2021-04-16 DIAGNOSIS — Z5321 Procedure and treatment not carried out due to patient leaving prior to being seen by health care provider: Secondary | ICD-10-CM | POA: Insufficient documentation

## 2021-04-16 LAB — CBC
HCT: 40.1 % (ref 36.0–46.0)
Hemoglobin: 13.7 g/dL (ref 12.0–15.0)
MCH: 32.9 pg (ref 26.0–34.0)
MCHC: 34.2 g/dL (ref 30.0–36.0)
MCV: 96.2 fL (ref 80.0–100.0)
Platelets: 260 10*3/uL (ref 150–400)
RBC: 4.17 MIL/uL (ref 3.87–5.11)
RDW: 13.7 % (ref 11.5–15.5)
WBC: 8.2 10*3/uL (ref 4.0–10.5)
nRBC: 0 % (ref 0.0–0.2)

## 2021-04-16 NOTE — ED Triage Notes (Addendum)
Pt arrived via POV with reports of coughing up blood x 3 days, pt states she feels fatigued and has the chills occasionally.  Pt also c/o nausea. Pt also reports she has chest pain occasionally as well as some abdominal pain.  Pt does not take any blood thinning medications.

## 2021-04-16 NOTE — ED Notes (Signed)
Red top and blue top tube sent to lab with save labels also urine sent with save label

## 2021-04-17 ENCOUNTER — Emergency Department: Payer: Medicare Other

## 2021-04-17 LAB — BASIC METABOLIC PANEL
Anion gap: 9 (ref 5–15)
BUN: 16 mg/dL (ref 6–20)
CO2: 27 mmol/L (ref 22–32)
Calcium: 8.8 mg/dL — ABNORMAL LOW (ref 8.9–10.3)
Chloride: 102 mmol/L (ref 98–111)
Creatinine, Ser: 0.69 mg/dL (ref 0.44–1.00)
GFR, Estimated: >60 mL/min (ref >60)
Glucose, Bld: 100 mg/dL — ABNORMAL HIGH (ref 70–99)
Potassium: 3.2 mmol/L — ABNORMAL LOW (ref 3.5–5.1)
Sodium: 138 mmol/L (ref 135–145)

## 2021-04-17 LAB — RESP PANEL BY RT-PCR (FLU A&B, COVID) ARPGX2
Influenza A by PCR: NEGATIVE
Influenza B by PCR: NEGATIVE
SARS Coronavirus 2 by RT PCR: NEGATIVE

## 2021-04-17 LAB — HEPATIC FUNCTION PANEL
ALT: 12 U/L (ref 0–44)
AST: 16 U/L (ref 15–41)
Albumin: 4.2 g/dL (ref 3.5–5.0)
Alkaline Phosphatase: 75 U/L (ref 38–126)
Bilirubin, Direct: <0.1 mg/dL (ref 0.0–0.2)
Total Bilirubin: 0.5 mg/dL (ref 0.3–1.2)
Total Protein: 7.6 g/dL (ref 6.5–8.1)

## 2021-04-17 LAB — LIPASE, BLOOD: Lipase: 34 U/L (ref 11–51)

## 2021-04-17 LAB — TROPONIN I (HIGH SENSITIVITY): Troponin I (High Sensitivity): 4 ng/L (ref <18)

## 2021-04-17 LAB — LACTIC ACID, PLASMA: Lactic Acid, Venous: 0.7 mmol/L (ref 0.5–1.9)

## 2021-04-17 MED ORDER — SODIUM CHLORIDE 0.9 % IV BOLUS
1000.0000 mL | Freq: Once | INTRAVENOUS | Status: AC
  Administered 2021-04-17: 1000 mL via INTRAVENOUS

## 2021-04-17 MED ORDER — FAMOTIDINE IN NACL 20-0.9 MG/50ML-% IV SOLN
20.0000 mg | Freq: Once | INTRAVENOUS | Status: AC
  Administered 2021-04-17: 20 mg via INTRAVENOUS
  Filled 2021-04-17: qty 50

## 2021-04-17 MED ORDER — ONDANSETRON HCL 4 MG/2ML IJ SOLN
4.0000 mg | Freq: Once | INTRAMUSCULAR | Status: AC
  Administered 2021-04-17: 4 mg via INTRAVENOUS
  Filled 2021-04-17: qty 2

## 2021-04-17 MED ORDER — IOHEXOL 350 MG/ML SOLN
100.0000 mL | Freq: Once | INTRAVENOUS | Status: AC | PRN
  Administered 2021-04-17: 100 mL via INTRAVENOUS

## 2021-04-17 MED ORDER — SODIUM CHLORIDE 0.9 % IV SOLN
1.0000 g | Freq: Once | INTRAVENOUS | Status: AC
  Administered 2021-04-17: 1 g via INTRAVENOUS
  Filled 2021-04-17: qty 10

## 2021-04-17 MED ORDER — SODIUM CHLORIDE 0.9 % IV SOLN
500.0000 mg | Freq: Once | INTRAVENOUS | Status: AC
  Administered 2021-04-17: 500 mg via INTRAVENOUS
  Filled 2021-04-17: qty 500

## 2021-04-17 MED ORDER — AZITHROMYCIN 250 MG PO TABS: 250.0000 mg | ORAL_TABLET | Freq: Every day | ORAL | 0 refills | Status: DC

## 2021-04-17 MED ORDER — HYDROCOD POLST-CPM POLST ER 10-8 MG/5ML PO SUER
5.0000 mL | Freq: Two times a day (BID) | ORAL | 0 refills | Status: DC
Start: 2021-04-17 — End: 2021-05-29

## 2021-04-17 MED ORDER — CEPHALEXIN 500 MG PO CAPS: 500.0000 mg | ORAL_CAPSULE | Freq: Three times a day (TID) | ORAL | 0 refills | Status: DC

## 2021-04-17 MED ORDER — ALBUTEROL SULFATE HFA 108 (90 BASE) MCG/ACT IN AERS: 2.0000 | INHALATION_SPRAY | RESPIRATORY_TRACT | 0 refills | Status: AC | PRN

## 2021-04-17 NOTE — ED Notes (Signed)
Patient transported to CT 

## 2021-04-17 NOTE — ED Notes (Signed)
Pt ambulated to and from the bathroom without difficulty. Her O2 was in the high 90's

## 2021-04-17 NOTE — ED Provider Notes (Signed)
[facility] Emergency Department Provider Note   ____________________________________________   Event Date/Time   First MD Initiated Contact with Patient 04/17/21 0007     (approximate)  I have reviewed the triage vital signs and the nursing notes.   HISTORY  Chief Complaint Hemoptysis, Chest Pain, and Abdominal Pain    HPI Nicole Wyatt is a 55 y.o. female who presents to the ED from home with a chief complaint of hemoptysis x3 days.  Patient reports she has been undergoing a GI work-up for unexplained weight loss and fatigue.  Had an endoscopy 3 months ago.  States the first night she felt like she was vomiting blood but the past 2 days she has been coughing up bright red blood.  Also complains of nausea, occasional chest pain and some epigastric abdominal pain.  Does not take anticoagulants.  Denies fever, chills, shortness of breath, dysuria or diarrhea.     Past Medical History:  Diagnosis Date  . Abnormal Pap smear of cervix   . Anxiety   . Arthritis   . Bipolar 1 disorder (HCC)   . COPD (chronic obstructive pulmonary disease) (HCC)   . Depression   . Dyspnea    with exertion   . History of kidney stones   . Hyperlipidemia   . Oxygen dependent    patient uses 2L oz PRN ; reports hardly ever uses it unless very SOB    . Rocky Mountain spotted fever 2018  . Sarcoidosis of lung (HCC)    managed by Dr Lindie SpruceSaddat   . Seizures (HCC) last seizure 05/2013  . Sleep apnea    uses CPAP nightly   . UTI (urinary tract infection) 11/13/2018   see ED visit in epic ; was dc'd with oral abx and reports completed and relief of sx     Patient Active Problem List   Diagnosis Date Noted  . Kidney stone 01/05/2020  . Atopic dermatitis 09/08/2019  . 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/08/2019  . Acute cystitis with hematuria 09/08/2019  . Other fatigue 07/19/2019  . Hypoglycemia 07/19/2019  . Arm weakness 03/07/2019  . Influenza A 02/24/2019  . Acute  upper respiratory infection 02/24/2019  . Chronic obstructive pulmonary disease (HCC) 02/24/2019  . Acute otitis externa of both ears 01/06/2019  . Mild intermittent asthma without complication 01/06/2019  . Primary osteoarthritis of hip 12/02/2018  . 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 OSA (obstructive sleep apnea) 10/13/2018  . 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 Rocky [place] spotted fever 07/02/2018  . Urinary tract infection without hematuria 07/02/2018  . Dysuria 06/23/2018  . Depression, major, recurrent, moderate (HCC) 03/26/2018  . Generalized edema 03/26/2018  . Anaphylactic syndrome 03/26/2018  . Cutaneous candidiasis 03/26/2018  . Bursitis of hip 02/03/2018  . Osteoarthritis of knee 02/03/2018  . Chronic pulmonary hypertension (HCC) 06/18/2016  . S/P cardiac cath 06/18/2016  . Unstable angina (HCC) 05/24/2016  . Bilateral leg edema 05/22/2016  . Shortness of breath 04/30/2016  . Skin lesion of right arm 03/01/2016  . Screening for breast cancer 06/10/2015  . Skin lesion of breast 06/10/2015  . Hx of seizure disorder 06/24/2014  . Left-sided weakness 06/24/2014  . Tobacco abuse 06/24/2014    Past Surgical History:  Procedure Laterality Date  . ABDOMINAL HYSTERECTOMY  1999  . BLADDER SURGERY    . CARDIAC CATHETERIZATION Bilateral 05/29/2016   Procedure: Right/Left Heart Cath and Coronary Angiography;  Surgeon: Lamar BlinksBruce J Kowalski, MD;  Location: [facility] INVASIVE CV  LAB;  Service: Cardiovascular;  Laterality: Bilateral;  . COLONOSCOPY    . COLONOSCOPY N/A 07/19/2015   Procedure: COLONOSCOPY;  Surgeon: Earline Mayotte, MD;  Location: [facility] ENDOSCOPY;  Service: Endoscopy;  Laterality: N/A;  . CYSTOSCOPY WITH STENT PLACEMENT Left 01/05/2020   Procedure: CYSTOSCOPY WITH STENT PLACEMENT;  Surgeon: Riki Altes, MD;  Location: [facility] ORS;  Service: Urology;  Laterality: Left;  . CYSTOSCOPY/URETEROSCOPY/HOLMIUM LASER/STENT PLACEMENT Left 01/26/2020   Procedure:  CYSTOSCOPY/URETEROSCOPY/HOLMIUM LASER/STENT Exchange;  Surgeon: Riki Altes, MD;  Location: [facility] ORS;  Service: Urology;  Laterality: Left;  . ESOPHAGOGASTRODUODENOSCOPY (EGD) WITH PROPOFOL N/A 03/01/2021   Procedure: ESOPHAGOGASTRODUODENOSCOPY (EGD) WITH PROPOFOL;  Surgeon: Toney Reil, MD;  Location: [facility] ENDOSCOPY;  Service: Gastroenterology;  Laterality: N/A;  . FLEXIBLE BRONCHOSCOPY N/A 07/27/2016   Procedure: FLEXIBLE BRONCHOSCOPY;  Surgeon: Yevonne Pax, MD;  Location: [facility] ORS;  Service: Pulmonary;  Laterality: N/A;  . FOOT SURGERY    . JOINT REPLACEMENT Left    hip  . KIDNEY STONE SURGERY  7 years  . TOTAL HIP ARTHROPLASTY Left 12/02/2018   Procedure: TOTAL HIP ARTHROPLASTY ANTERIOR APPROACH;  Surgeon: Sheral Apley, MD;  Location: [facility] ORS;  Service: Orthopedics;  Laterality: Left;  . TUBAL LIGATION      Prior to Admission medications   Medication Sig Start Date End Date Taking? Authorizing Provider  albuterol (VENTOLIN HFA) 108 (90 Base) MCG/ACT inhaler Inhale 2 puffs into the lungs every 4 (four) hours as needed for wheezing or shortness of breath. 04/17/21  Yes Irean Hong, MD  azithromycin (ZITHROMAX) 250 MG tablet Take 1 tablet (250 mg total) by mouth daily. 04/17/21  Yes Irean Hong, MD  cephALEXin (KEFLEX) 500 MG capsule Take 1 capsule (500 mg total) by mouth 3 (three) times daily. 04/17/21  Yes Irean Hong, MD  chlorpheniramine-HYDROcodone [facility] PENNKINETIC ER) 10-8 MG/5ML SUER Take 5 mLs by mouth 2 (two) times daily. 04/17/21  Yes Irean Hong, MD  Alum & Mag Hydroxide-Simeth (GI COCKTAIL) SUSP suspension Shake well. Each dose to containe 78mL maalox and 47mL viscous lidocaine 01/11/21   Minna Antis, MD  amLODipine (NORVASC) 10 MG tablet Take 10 mg by mouth daily. 10/10/20   [provider]  aspirin EC 81 MG tablet Take 1 tablet (81 mg total) by mouth 2 (two) times daily. For DVT prophylaxis for 30 days after surgery. Patient taking  differently: Take 81 mg by mouth daily. 12/02/18   Martensen, Lucretia Kern III, PA-C  budesonide-formoterol (SYMBICORT) 160-4.5 MCG/ACT inhaler Inhale 2 puffs into the lungs 2 (two) times daily. 08/18/20   Coralyn Helling, MD  EPINEPHrine 0.3 mg/0.3 mL IJ SOAJ injection Inject 0.3 mg into the muscle as needed for anaphylaxis.    [provider]  fluticasone (FLONASE) 50 MCG/ACT nasal spray Place 2 sprays into both nostrils daily. Patient taking differently: Place 2 sprays into both nostrils daily as needed for allergies. 02/16/19   Fisher, Roselyn Bering, PA-C  furosemide (LASIX) 40 MG tablet Take 1 tablet (40 mg total) by mouth daily as needed for fluid. Patient taking differently: Take 40 mg by mouth daily as needed for fluid or edema. 01/06/19   Carlean Jews, NP  hydrochlorothiazide (MICROZIDE) 12.5 MG capsule Take 12.5 mg by mouth daily. 09/02/20   [provider]  meloxicam (MOBIC) 15 MG tablet Take 1 tablet (15 mg total) by mouth daily. 11/02/19   Carlean Jews, NP  pantoprazole (PROTONIX) 40 MG tablet Take 1 tablet (40  mg total) by mouth 2 (two) times daily. 02/22/21 03/24/21  Toney Reil, MD  SPIRIVA HANDIHALER 18 MCG inhalation capsule 1 capsule daily. 09/14/20   [provider]  venlafaxine (EFFEXOR) 75 MG tablet Take by mouth. 08/03/20   [provider]    Allergies Bee venom, Ciprofloxacin, and Penicillins  Family History  Problem Relation Age of Onset  . Hypertension Mother   . Arthritis Mother   . Heart disease Mother   . Heart failure Mother   . Hypertension Father   . Arthritis Father   . Prostate cancer Father   . Heart disease Father   . Heart attack Father   . Arrhythmia Father 30       pacemaker implant  . Ovarian cancer Maternal Grandmother     Social History Social History   Tobacco Use  . Smoking status: Current Every Day Smoker    Packs/day: 0.50    Years: 14.00    Pack years: 7.00    Types: Cigarettes  . Smokeless  tobacco: Never Used  Vaping Use  . Vaping Use: Never used  Substance Use Topics  . Alcohol use: Yes    Alcohol/week: 0.0 standard drinks    Comment: seldom   . Drug use: No    Review of Systems  Constitutional: No fever/chills Eyes: No visual changes. ENT: No sore throat. Cardiovascular: Positive for chest pain. Respiratory: Positive for hemoptysis.  Denies shortness of breath. Gastrointestinal: Positive for abdominal pain.  Positive for vomiting.  No diarrhea.  No constipation. Genitourinary: Negative for dysuria. Musculoskeletal: Negative for back pain. Skin: Negative for rash. Neurological: Negative for headaches, focal weakness or numbness.   ____________________________________________   PHYSICAL EXAM:  VITAL SIGNS: ED Triage Vitals  Enc Vitals Group     BP 04/16/21 2304 115/75     Pulse Rate 04/16/21 2304 91     Resp 04/16/21 2304 18     Temp 04/16/21 2304 98.7 F (37.1 C)     Temp Source 04/16/21 2304 Oral     SpO2 04/16/21 2304 94 %     Weight 04/16/21 2302 226 lb 13.7 oz (102.9 kg)     Height 04/16/21 2302  (1.702 m)     Head Circumference --      Peak Flow --      Pain Score 04/16/21 2302 9     Pain Loc --      Pain Edu? --      Excl. in GC? --     Constitutional: Alert and oriented. Well appearing and in mild acute distress. Eyes: Conjunctivae are normal. PERRL. EOMI. Head: Atraumatic. Nose: No congestion/rhinnorhea. Mouth/Throat: Mucous membranes are moist.   Neck: No stridor.   Cardiovascular: Normal rate, regular rhythm. Grossly normal heart sounds.  Good peripheral circulation. Respiratory: Normal respiratory effort.  No retractions. Lungs CTAB. Gastrointestinal: Soft and mildly tender to palpation epigastrium without rebound or guarding. No distention. No abdominal bruits. No CVA tenderness. Musculoskeletal: No lower extremity tenderness nor edema.  No joint effusions. Neurologic:  Normal speech and language. No gross focal neurologic  deficits are appreciated.  Skin:  Skin is warm, dry and intact. No rash noted. Psychiatric: Mood and affect are normal. Speech and behavior are normal.  ____________________________________________   LABS (all labs ordered are listed, but only abnormal results are displayed)  Labs Reviewed  BASIC METABOLIC PANEL - Abnormal; Notable for the following components:      Result Value   Potassium 3.2 (*)  Glucose, Bld 100 (*)    Calcium 8.8 (*)    All other components within normal limits  RESP PANEL BY RT-PCR (FLU A&B, COVID) ARPGX2  CULTURE, BLOOD (ROUTINE X 2)  CULTURE, BLOOD (ROUTINE X 2)  CBC  HEPATIC FUNCTION PANEL  LIPASE, BLOOD  LACTIC ACID, PLASMA  TROPONIN I (HIGH SENSITIVITY)   ____________________________________________  EKG  ED ECG REPORT I, Deaysia Grigoryan J, the attending physician, personally viewed and interpreted this ECG.   Date: 04/17/2021  EKG Time: 2316  Rate: 83  Rhythm: normal EKG, normal sinus rhythm  Axis: Normal  Intervals:left anterior fascicular block  ST&T Change: Nonspecific  ____________________________________________  RADIOLOGY I, Demetri Goshert J, personally viewed and evaluated these images (plain radiographs) as part of my medical decision making, as well as reviewing the written report by the radiologist.  ED MD interpretation: No acute cardiopulmonary process; no PE, patchy bilateral infiltrates representing early multifocal pneumonia  Official radiology report(s): No results found.  ____________________________________________   PROCEDURES  Procedure(s) performed (including Critical Care):  .1-3 Lead EKG Interpretation Performed by: Irean Hong, MD Authorized by: Irean Hong, MD     Interpretation: normal     ECG rate:  83   ECG rate assessment: normal     Rhythm: sinus rhythm     Ectopy: none     Conduction: normal   Comments:     Patient placed on cardiac monitor to evaluate for  arrhythmias   ____________________________________________   INITIAL IMPRESSION / ASSESSMENT AND PLAN / ED COURSE  As part of my medical decision making, I reviewed the following data within the electronic MEDICAL RECORD NUMBER Nursing notes reviewed and incorporated, Labs reviewed, EKG interpreted, Old chart reviewed, Radiograph reviewed and Notes from prior ED visits     55 year old female presenting with hemoptysis, upper abdominal pain with possible hematemesis.  Differential diagnosis includes but is not limited to CAP, PE, gastritis, Mallory-Weiss tear, esophageal varices, etc.  Laboratory results demonstrate stable H/H, mild hypokalemia.  Will check LFTs/lipase, respiratory panel.  Obtain CTA chest to evaluate for PE.  I personally reviewed patient's chart and see her EGD from 03/01/2021 which demonstrated LA grade C acute esophagitis with no bleeding.  Clinical Course as of 04/23/21 0047  Mon Apr 17, 2021  0330 Patient resting in no acute distress.  Updated her of all test results.  Room air saturations 97%.  Will administer IV Rocephin and azithromycin.  Will reassess. [JS]  0551 IV antibiotics completed.  Patient passed ambulatory trial.  Will discharge home on Keflex and Azithromycin.  Tussionex and Albuterol MDI as needed for cough.  Strict return precautions given.  Patient verbalizes understanding agrees with plan of care. [JS]    Clinical Course User Index [JS] Irean Hong, MD     ____________________________________________   FINAL CLINICAL IMPRESSION(S) / ED DIAGNOSES  Final diagnoses:  Hemoptysis  Community acquired pneumonia, unspecified laterality     ED Discharge Orders         Ordered    cephALEXin (KEFLEX) 500 MG capsule  3 times daily        04/17/21 0553    azithromycin (ZITHROMAX) 250 MG tablet  Daily        04/17/21 0553    chlorpheniramine-HYDROcodone (TUSSIONEX PENNKINETIC ER) 10-8 MG/5ML SUER  2 times daily        04/17/21 0553    albuterol  (VENTOLIN HFA) 108 (90 Base) MCG/ACT inhaler  Every 4 hours PRN  04/17/21 0553          *Please note:  Nicole Wyatt was evaluated in Emergency Department on 04/23/2021 for the symptoms described in the history of present illness. She was evaluated in the context of the global COVID-19 pandemic, which necessitated consideration that the patient might be at risk for infection with the SARS-CoV-2 virus that causes COVID-19. Institutional protocols and algorithms that pertain to the evaluation of patients at risk for COVID-19 are in a state of rapid change based on information released by regulatory bodies including the CDC and federal and state organizations. These policies and algorithms were followed during the patient's care in the ED.  Some ED evaluations and interventions may be delayed as a result of limited staffing during and the pandemic.*   Note:  This document was prepared using Dragon voice recognition software and may include unintentional dictation errors.   Irean Hong, MD 04/23/21 [phone]

## 2021-04-17 NOTE — Discharge Instructions (Addendum)
1.  Take antibiotics as prescribed: Keflex 500mg 3 times daily x7 days Azithromycin 250 mg daily x4 days 2.  You may take Tussionex as needed for cough. 3.  You may use Albuterol inhaler 2 puffs every 4 hours as needed for cough/difficulty breathing. 4.  Return to the ER for worsening symptoms, persistent vomiting, difficulty breathing or other concerns.  

## 2021-04-22 LAB — CULTURE, BLOOD (ROUTINE X 2)
Culture: NO GROWTH
Culture: NO GROWTH
Special Requests: ADEQUATE
Special Requests: ADEQUATE

## 2021-04-26 ENCOUNTER — Other Ambulatory Visit: Payer: Self-pay | Admitting: Gastroenterology

## 2021-05-17 IMAGING — CT CT ABD-PELV W/ CM
2 of 5 series · 16 of 46 positions shown, 18 images · IV contrast (APPLIED)
Comparison: CT of the abdomen and pelvis 11/13/2018.

CLINICAL DATA: 53-year-old female with history of left lower
quadrant abdominal pain.

EXAM:
CT ABDOMEN AND PELVIS WITH CONTRAST
TECHNIQUE: Multidetector CT imaging of the abdomen and pelvis was performed
using the standard protocol following bolus administration of
intravenous contrast.
CONTRAST:  100mL OMNIPAQUE IOHEXOL 300 MG/ML  SOLN

[Series 2: routine abd/pel with (person_name) · axial · 0.93mm/px · z∈[-456,-16]mm · 13 of 100 slices shown, 15 images]
[im 6/100  soft-tissue]
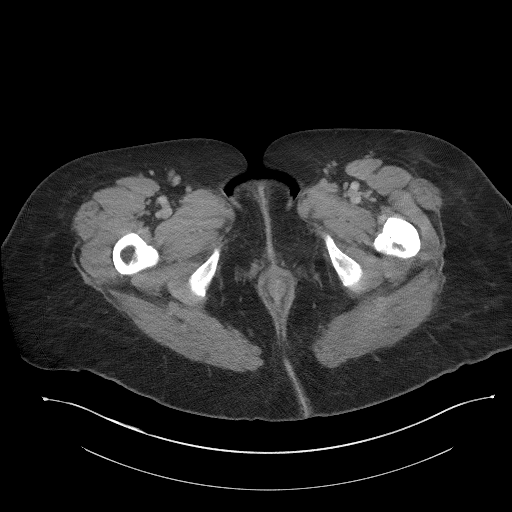
[im 6/100  bone]
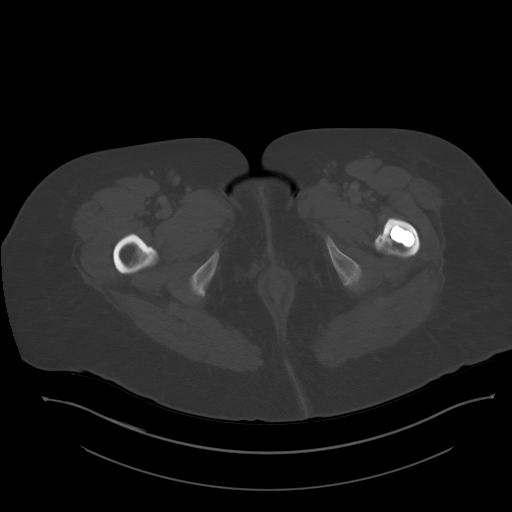
[im 12/100  soft-tissue]
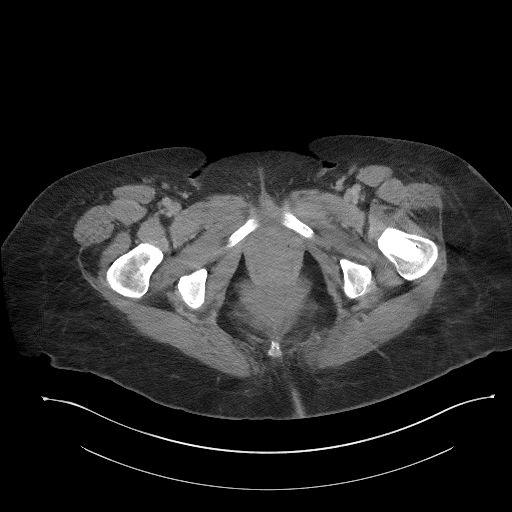
[im 24/100  soft-tissue]
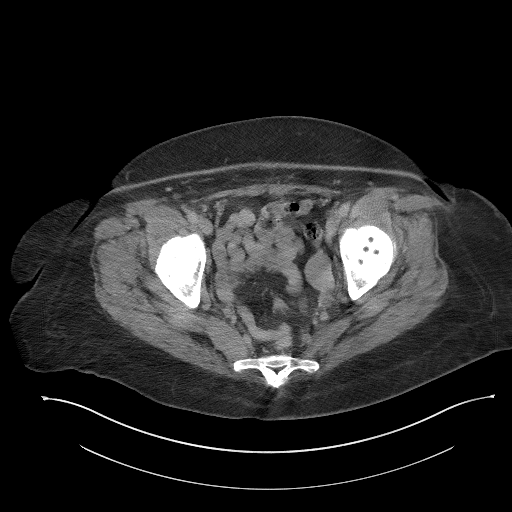
[im 30/100  soft-tissue]
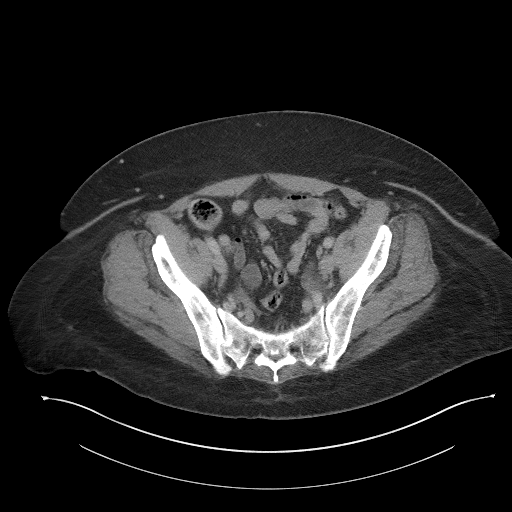
[im 35/100  soft-tissue]
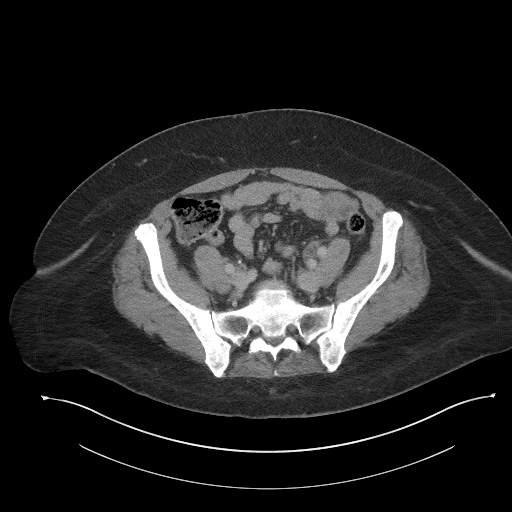
[im 41/100  soft-tissue]
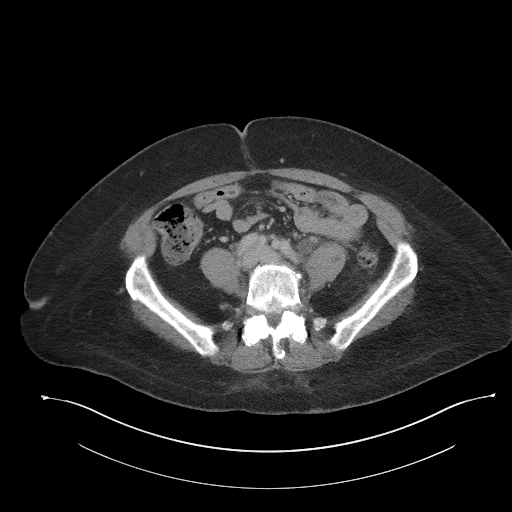
[im 53/100  soft-tissue]
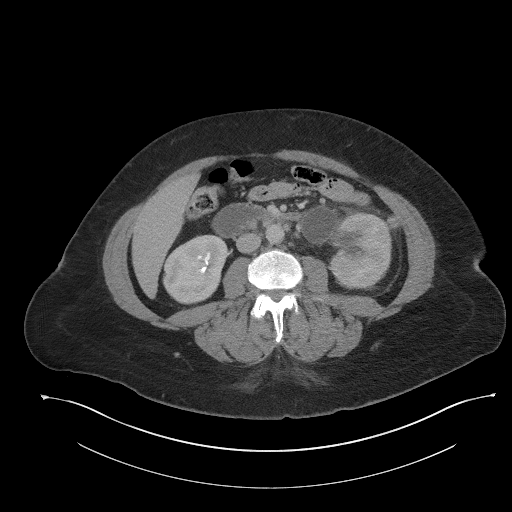
[im 59/100  soft-tissue]
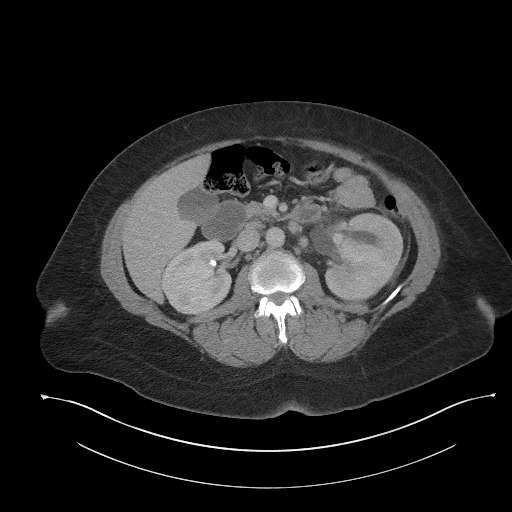
[im 65/100  soft-tissue]
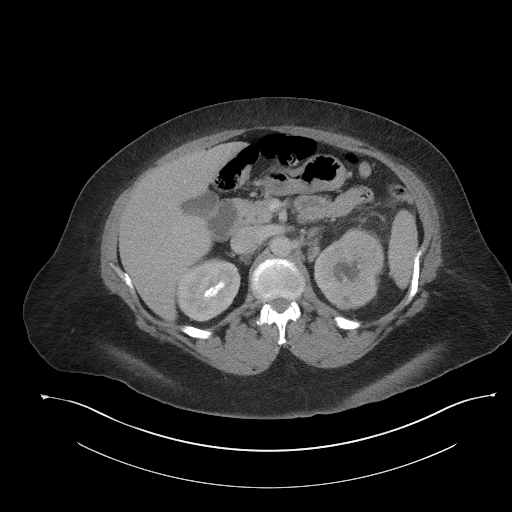
[im 65/100  bone]
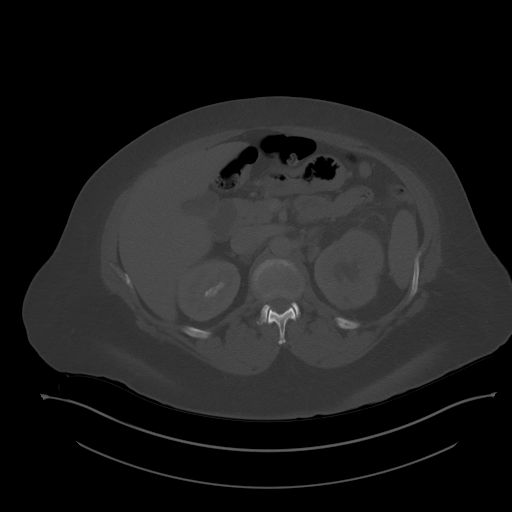
[im 70/100  soft-tissue]
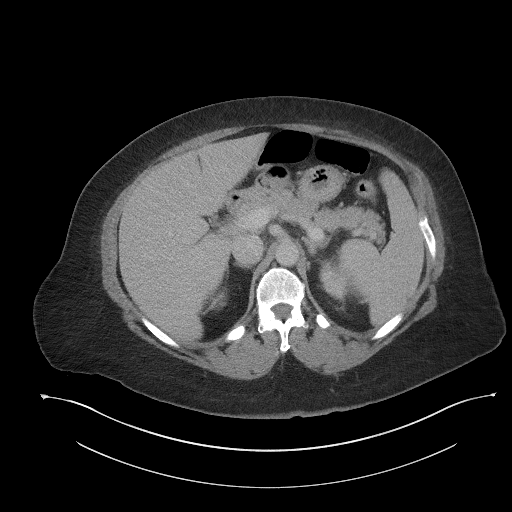
[im 76/100  soft-tissue]
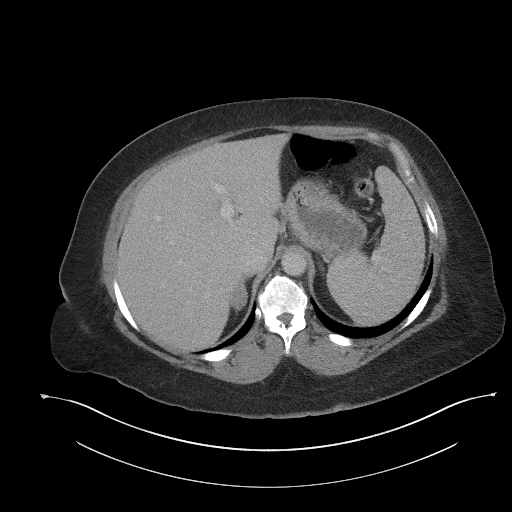
[im 88/100  soft-tissue]
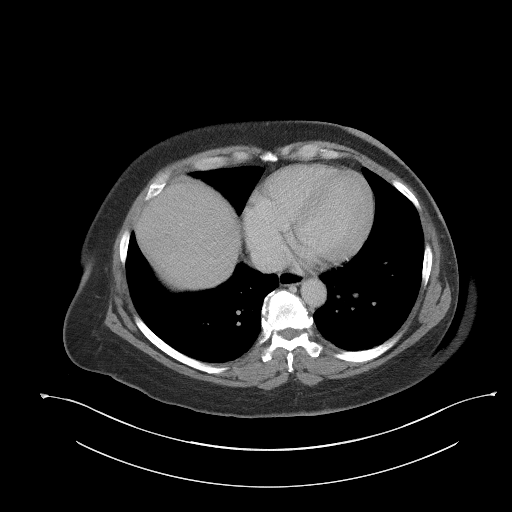
[im 94/100  soft-tissue]
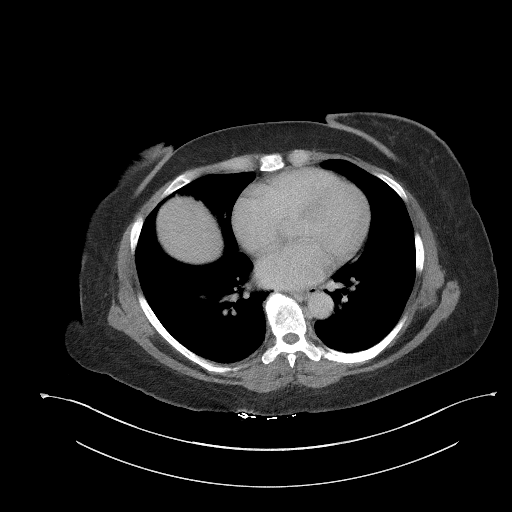

[Series 5: coronal st · coronal · 0.93mm/px · 3 of 84 slices shown]
[im 28/84  soft-tissue]
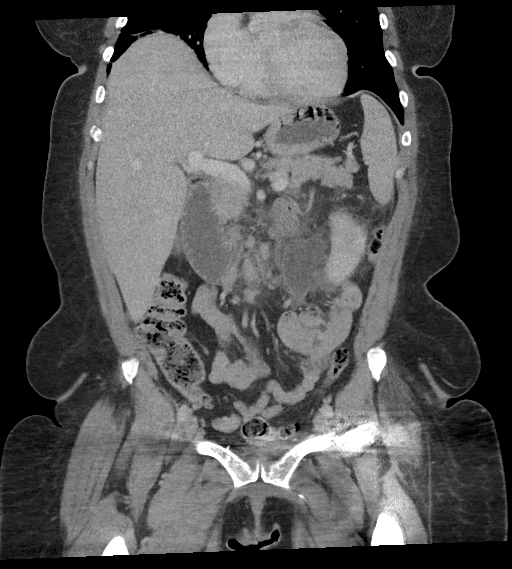
[im 37/84  soft-tissue]
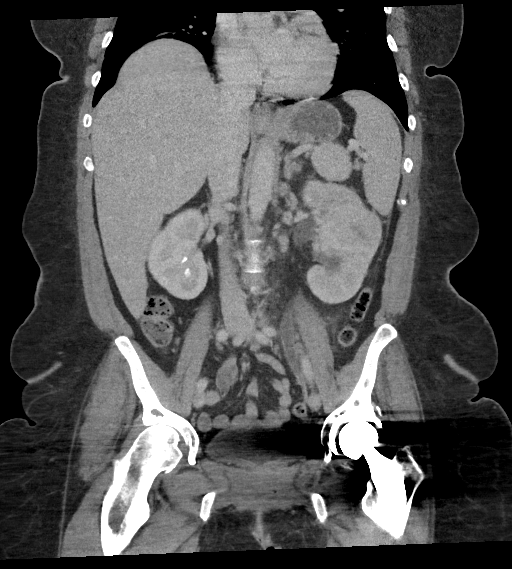
[im 47/84  soft-tissue]
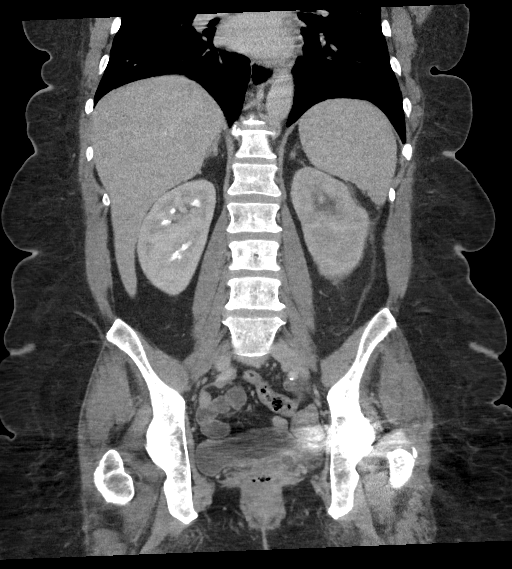

[16 of 46 positions shown; findings below may reference images not displayed]

FINDINGS: Lower chest: Unremarkable.

Hepatobiliary: No suspicious cystic or solid hepatic lesions. No
intra or extrahepatic biliary ductal dilatation. Gallbladder is
normal in appearance.

Pancreas: No pancreatic mass. No pancreatic ductal dilatation. No
pancreatic or peripancreatic fluid collections or inflammatory
changes.

Spleen: Unremarkable.

Adrenals/Urinary Tract: At the left ureteropelvic junction there is
a 1.2 cm obstructive calculus (axial image 84 of series 2) which
partially prolapses into the urinary bladder because of an
underlying ureterocele. This is associated with moderate proximal
left hydroureteronephrosis and extensive left perinephric stranding.
Right kidney is normal in appearance. Urinary bladder is otherwise
unremarkable in appearance. Small bilateral adrenal nodules (right
greater than left) measuring up to 2.2 x 1.4 cm on the right side,
incompletely characterize, but stable on prior examinations, favored
to represent benign lesions such as lipid poor adenomas.

Stomach/Bowel: Normal appearance of the stomach. No pathologic
dilatation of small bowel or colon. Normal appendix.

Vascular/Lymphatic: Aortic atherosclerosis, without evidence of
aneurysm or dissection in the abdominal or pelvic vasculature.
Multiple prominent borderline enlarged para-aortic retroperitoneal
lymph nodes measuring up to 8 mm in short axis, nonspecific, but
likely reactive in the setting of left-sided urinary tract
obstruction. No other lymphadenopathy noted elsewhere in the abdomen
or pelvis.

Reproductive: Status post hysterectomy. Ovaries are unremarkable in
appearance.

Other: Trace volume of ascites in the cul-de-sac. No
pneumoperitoneum.

Musculoskeletal: Status post left hip arthroplasty. There are no
aggressive appearing lytic or blastic lesions noted in the
visualized portions of the skeleton.
IMPRESSION: 1. Obstructive calculus at the left ureterovesicular junction within
a large left ureterocele, with moderate left proximal
hydroureteronephrosis.
2. Trace volume of ascites.
3. Aortic atherosclerosis.
4. Additional incidental findings, similar to prior studies, as
above.

## 2021-05-29 ENCOUNTER — Other Ambulatory Visit: Payer: Self-pay

## 2021-05-29 ENCOUNTER — Ambulatory Visit: Payer: Medicare Other | Admitting: Gastroenterology

## 2021-05-29 ENCOUNTER — Encounter: Payer: Self-pay | Admitting: Gastroenterology

## 2021-05-29 VITALS — BP 129/78 | HR 85 | Temp 97.9°F | Ht 67.0 in | Wt 224.2 lb

## 2021-05-29 DIAGNOSIS — K2210 Ulcer of esophagus without bleeding: Secondary | ICD-10-CM

## 2021-05-29 NOTE — Progress Notes (Signed)
Nicole Wyatt Leilani Cespedes, MD [address]  [address]  [place], [place] [address]  Main: [phone]  Fax: [phone]    Gastroenterology Consultation  Referring Provider:     [facility] Primary Care Physician:  [facility], [facility] Primary Gastroenterologist:  Dr. Arlyss Repressohini Wyatt Ollen Rao Reason for Consultation:     Heartburn, epigastric pain        HPI:   Nicole ScalesSelena L Wyatt is a 55 y.o. female referred by Dr. Eli Phillipsenter, [facility]  for consultation & management of more than 2 months history of burning sensation in the chest as well as epigastric burning pain.  Patient went to the ER on 01/11/2021 secondary to nausea, vomiting, she was placed on omeprazole 20 mg daily for reflux, she was still experiencing vomiting.  Cardiac etiology was ruled out in the ER.  She was discharged on Protonix 40 mg daily.  Patient reports that her symptoms of severe burning in the chest associated with epigastric pain is ongoing.  She reports that every time she tries to eat something or even drink water, everything comes back up.  She lost about 10 pounds since January.  She does have history of chronic tobacco use, about 6 cigarettes/day.  Rest of the labs in the ER revealed mild anemia, normal LFTs, lipase.  Alcohol worsens the symptoms CT abdomen and pelvis in 6/21 revealed normal pancreas, mild fatty liver, no evidence of cholelithiasis  Follow-up visit 05/29/2021 Patient is here for follow-up of right upper quadrant pain.  Patient underwent upper endoscopy with gastric biopsies which were unremarkable.  H. pylori breath test negative.  She did not undergo right upper quadrant ultrasound.  She did have LA grade C esophagitis and currently taking Protonix 40 mg twice daily.  And patient reports that her reflux symptoms are fairly under control  Patient was diagnosed with multifocal pneumonia when she went to the ER on 04/17/2021 secondary to hemoptysis.  Patient shared a picture of coughed  up blood.  She did say that she was not vomiting blood at that time.  She was treated with IV antibiotics in the ER and was discharged home on Keflex and azithromycin.  Patient reports that she has a follow-up with PCP next month.  She had an episode of cough with blood-tinged sputum today.  Patient's weight has been stable since last visit  NSAIDs: None  Antiplts/Anticoagulants/Anti thrombotics: None  GI Procedures:  EGD 03/01/2021 - Normal duodenal bulb and second portion of the duodenum. - Normal stomach. Biopsied. - Esophagogastric landmarks identified. - LA Grade C acute esophagitis with no bleeding.  DIAGNOSIS:  A. STOMACH, RANDOM; COLD BIOPSY:  - ANTRAL AND OXYNTIC MUCOSA WITH NO SIGNIFICANT PATHOLOGIC ALTERATION.  - NEGATIVE FOR ACTIVE INFLAMMATION AND H PYLORI.  - NEGATIVE FOR INTESTINAL METAPLASIA, DYSPLASIA, AND MALIGNANCY.   Colonoscopy 07/19/2015 - The entire examined colon is normal on direct and retroflexion views. - No specimens collected.  Patient denies family history of GI malignancy  Past Medical History:  Diagnosis Date  . Abnormal Pap smear of cervix   . Anxiety   . Arthritis   . Bipolar 1 disorder (HCC)   . COPD (chronic obstructive pulmonary disease) (HCC)   . Depression   . Dyspnea    with exertion   . History of kidney stones   . Hyperlipidemia   . Oxygen dependent    patient uses 2L oz PRN ; reports hardly ever uses it unless very SOB    [facility]  . Sarcoidosis of lung (HCC)    managed by Dr Lindie Spruce   . Seizures (HCC) last seizure 05/2013  . Sleep apnea    uses CPAP nightly   . UTI (urinary tract infection) 11/13/2018   see ED visit in epic ; was dc'd with oral abx and reports completed and relief of sx     Past Surgical History:  Procedure Laterality Date  . ABDOMINAL HYSTERECTOMY  1999  . BLADDER SURGERY    . CARDIAC CATHETERIZATION Bilateral 05/29/2016   Procedure: Right/Left Heart Cath and Coronary Angiography;   Surgeon: Lamar Blinks, MD;  Location: [facility] INVASIVE CV LAB;  Service: Cardiovascular;  Laterality: Bilateral;  . COLONOSCOPY    . COLONOSCOPY N/A 07/19/2015   Procedure: COLONOSCOPY;  Surgeon: Earline Mayotte, MD;  Location: [facility] ENDOSCOPY;  Service: Endoscopy;  Laterality: N/A;  . CYSTOSCOPY WITH STENT PLACEMENT Left 01/05/2020   Procedure: CYSTOSCOPY WITH STENT PLACEMENT;  Surgeon: Riki Altes, MD;  Location: [facility] ORS;  Service: Urology;  Laterality: Left;  . CYSTOSCOPY/URETEROSCOPY/HOLMIUM LASER/STENT PLACEMENT Left 01/26/2020   Procedure: CYSTOSCOPY/URETEROSCOPY/HOLMIUM LASER/STENT Exchange;  Surgeon: Riki Altes, MD;  Location: [facility] ORS;  Service: Urology;  Laterality: Left;  . ESOPHAGOGASTRODUODENOSCOPY (EGD) WITH PROPOFOL N/A 03/01/2021   Procedure: ESOPHAGOGASTRODUODENOSCOPY (EGD) WITH PROPOFOL;  Surgeon: Toney Reil, MD;  Location: [facility] ENDOSCOPY;  Service: Gastroenterology;  Laterality: N/A;  . FLEXIBLE BRONCHOSCOPY N/A 07/27/2016   Procedure: FLEXIBLE BRONCHOSCOPY;  Surgeon: Yevonne Pax, MD;  Location: [facility] ORS;  Service: Pulmonary;  Laterality: N/A;  . FOOT SURGERY    . JOINT REPLACEMENT Left    hip  . KIDNEY STONE SURGERY  7 years  . TOTAL HIP ARTHROPLASTY Left 12/02/2018   Procedure: TOTAL HIP ARTHROPLASTY ANTERIOR APPROACH;  Surgeon: Sheral Apley, MD;  Location: [facility] ORS;  Service: Orthopedics;  Laterality: Left;  . TUBAL LIGATION     Current Outpatient Medications:  .  albuterol (VENTOLIN HFA) 108 (90 Base) MCG/ACT inhaler, Inhale 2 puffs into the lungs every 4 (four) hours as needed for wheezing or shortness of breath., Disp: 18 g, Rfl: 0 .  Alum & Mag Hydroxide-Simeth (GI COCKTAIL) SUSP suspension, Shake well. Each dose to containe 9mL maalox and 32mL viscous lidocaine, Disp: 300 mL, Rfl: 0 .  amLODipine (NORVASC) 10 MG tablet, Take 10 mg by mouth daily., Disp: , Rfl:  .  aspirin EC 81 MG tablet, Take 1 tablet (81 mg total) by mouth 2 (two) times daily.  For DVT prophylaxis for 30 days after surgery. (Patient taking differently: Take 81 mg by mouth daily.), Disp: 60 tablet, Rfl: 0 .  budesonide-formoterol (SYMBICORT) 160-4.5 MCG/ACT inhaler, Inhale 2 puffs into the lungs 2 (two) times daily., Disp: 1 each, Rfl: 6 .  EPINEPHrine 0.3 mg/0.3 mL IJ SOAJ injection, Inject 0.3 mg into the muscle as needed for anaphylaxis., Disp: , Rfl:  .  fluticasone (FLONASE) 50 MCG/ACT nasal spray, Place 2 sprays into both nostrils daily. (Patient taking differently: Place 2 sprays into both nostrils daily as needed for allergies.), Disp: 16 g, Rfl: 2 .  furosemide (LASIX) 40 MG tablet, Take 1 tablet (40 mg total) by mouth daily as needed for fluid. (Patient taking differently: Take 40 mg by mouth daily as needed for fluid or edema.), Disp: 30 tablet, Rfl: 5 .  hydrochlorothiazide (MICROZIDE) 12.5 MG capsule, Take 12.5 mg by mouth daily., Disp: , Rfl:  .  meloxicam (MOBIC) 15 MG tablet, Take 1 tablet (15  mg total) by mouth daily., Disp: 30 tablet, Rfl: 0 .  ondansetron (ZOFRAN) 4 MG tablet, Take 4 mg by mouth every 8 (eight) hours as needed., Disp: , Rfl:  .  pantoprazole (PROTONIX) 40 MG tablet, TAKE 1 TABLET BY MOUTH TWICE DAILY, Disp: 60 tablet, Rfl: 1 .  SPIRIVA HANDIHALER 18 MCG inhalation capsule, 1 capsule daily., Disp: , Rfl:  .  venlafaxine (EFFEXOR) 75 MG tablet, Take by mouth., Disp: , Rfl:     Family History  Problem Relation Age of Onset  . Hypertension Mother   . Arthritis Mother   . Heart disease Mother   . Heart failure Mother   . Hypertension Father   . Arthritis Father   . Prostate cancer Father   . Heart disease Father   . Heart attack Father   . Arrhythmia Father 68       pacemaker implant  . Ovarian cancer Maternal Grandmother      Social History   Tobacco Use  . Smoking status: Current Every Day Smoker    Packs/day: 0.50    Years: 14.00    Pack years: 7.00    Types: Cigarettes  . Smokeless tobacco: Never Used  Vaping Use  .  Vaping Use: Never used  Substance Use Topics  . Alcohol use: Yes    Alcohol/week: 0.0 standard drinks    Comment: seldom   . Drug use: No    Allergies as of 05/29/2021 - Review Complete 05/29/2021  Allergen Reaction Noted  . Bee venom Anaphylaxis 05/29/2016  . Ciprofloxacin Nausea Only 06/23/2018  . Penicillins Diarrhea and Rash 07/26/2016    Review of Systems:    All systems reviewed and negative except where noted in HPI.   Physical Exam:  BP 129/78 (BP Location: Left Arm, Patient Position: Sitting, Cuff Size: Normal)   Pulse 85   Temp 97.9 F (36.6 C) (Oral)   Ht 5\' 7"  (1.702 m)   Wt 224 lb 4 oz (101.7 kg)   BMI 35.12 kg/m  No LMP recorded. Patient has had a hysterectomy.  General:   Alert,  Well-developed, well-nourished, pleasant and cooperative in NAD Head:  Normocephalic and atraumatic. Eyes:  Sclera clear, no icterus.   Conjunctiva pink. Ears:  Normal auditory acuity. Nose:  No deformity, discharge, or lesions. Mouth:  No deformity or lesions,oropharynx pink & moist. Neck:  Supple; no masses or thyromegaly. Lungs:  Respirations even and unlabored.  Clear throughout to auscultation.   No wheezes, crackles, or rhonchi. No acute distress. Heart:  Regular rate and rhythm; no murmurs, clicks, rubs, or gallops. Abdomen:  Normal bowel sounds. Soft, nontender and non-distended without masses, hepatosplenomegaly or hernias noted.  No guarding or rebound tenderness.   Rectal: Not performed Msk:  Symmetrical without gross deformities. Good, equal movement & strength bilaterally. Pulses:  Normal pulses noted. Extremities:  No clubbing or edema.  No cyanosis. Neurologic:  Alert and oriented x3;  grossly normal neurologically. Skin:  Intact without significant lesions or rashes. No jaundice. Psych:  Alert and cooperative. Normal mood and affect.  Imaging Studies: Reviewed  Assessment and Plan:   AMARRAH MEINHART is a 55 y.o. African-American female with BMI 34.8, chronic  tobacco use is seen in consultation for follow-up of erosive esophagitis, epigastric pain and weight loss.  Patient is recently diagnosed with multifocal pneumonia and treated with antibiotics.  Patient's weight loss has stabilized.  She reports that her reflux symptoms are fairly well controlled on Protonix 40 mg twice daily  Epigastric pain and erosive esophagitis Continue Protonix 40 mg p.o. twice daily Gastric biopsies and H. pylori breath test negative Patient is recovering from multifocal pneumonia, recommend repeat EGD in 1 month   Follow up as needed   Nicole Repress, MD

## 2021-05-29 NOTE — Patient Instructions (Addendum)
Your Ultrasound is scheduled for 06/27/2021 at 8:30am at out patient imaging. Nothing to eat or drink after midnight. Address is [address] [place], [place] [address]. Phone number is [phone]

## 2021-06-05 ENCOUNTER — Telehealth: Payer: Self-pay | Admitting: Gastroenterology

## 2021-06-05 NOTE — Telephone Encounter (Signed)
She states that when she was in the office on 05/29/21  you had offered to change her pantoprazole to another medication. She states the pantoprazole is not helping her symptoms. She feels like food just stays in her stomach and having a lot of belching. She would like medication change to another medication

## 2021-06-05 NOTE — Telephone Encounter (Signed)
Patient would like call back to talk about her meds

## 2021-06-06 MED ORDER — OMEPRAZOLE 40 MG PO CPDR: 40.0000 mg | DELAYED_RELEASE_CAPSULE | Freq: Two times a day (BID) | ORAL | 0 refills | Status: DC

## 2021-06-06 NOTE — Telephone Encounter (Signed)
She can try omeprazole 40 mg p.o. twice daily before meals for 1 month, instead of pantoprazole  RV

## 2021-06-06 NOTE — Telephone Encounter (Signed)
Patient verbalized understanding and sent medication to the pharmacy  

## 2021-06-06 NOTE — Addendum Note (Signed)
Addended by: Radene Knee L on: 06/06/2021 10:30 AM   Modules accepted: Orders

## 2021-06-07 ENCOUNTER — Other Ambulatory Visit: Payer: Self-pay

## 2021-06-07 ENCOUNTER — Telehealth: Payer: Self-pay

## 2021-06-07 DIAGNOSIS — K2210 Ulcer of esophagus without bleeding: Secondary | ICD-10-CM

## 2021-06-07 NOTE — Telephone Encounter (Signed)
-----   Message from Toney Reil, MD sent at 05/29/2021  6:29 PM EDT ----- Regarding: Schedule EGD Morrie Sheldon  Please schedule EGD Diagnosis: history of erosive esophagitis  RV

## 2021-06-07 NOTE — Telephone Encounter (Signed)
Called and got patient schedule for a EGD on 07/03/2021 with Dr. Allegra Lai at [facility]. Went over instructions with patient and sent to [employer] and mail

## 2021-06-27 ENCOUNTER — Ambulatory Visit: Payer: Medicare Other

## 2021-07-03 ENCOUNTER — Other Ambulatory Visit: Payer: Self-pay | Admitting: Gastroenterology

## 2021-07-03 ENCOUNTER — Ambulatory Visit: Payer: Medicare Other | Admitting: Anesthesiology

## 2021-07-03 ENCOUNTER — Encounter
Admission: RE | Disposition: A | Discharge status: Home / Self Care | Payer: Self-pay | Source: Ambulatory Visit | Attending: Gastroenterology

## 2021-07-03 ENCOUNTER — Encounter: Payer: Self-pay | Admitting: Gastroenterology

## 2021-07-03 ENCOUNTER — Ambulatory Visit
Admission: RE | Admit: 2021-07-03 | Discharge: 2021-07-03 | Disposition: A | Discharge status: Home / Self Care | Payer: Medicare Other | Source: Ambulatory Visit | Attending: Gastroenterology | Admitting: Gastroenterology

## 2021-07-03 DIAGNOSIS — Z881 Allergy status to other antibiotic agents status: Secondary | ICD-10-CM | POA: Insufficient documentation

## 2021-07-03 DIAGNOSIS — Z09 Encounter for follow-up examination after completed treatment for conditions other than malignant neoplasm: Secondary | ICD-10-CM | POA: Diagnosis not present

## 2021-07-03 DIAGNOSIS — F17210 Nicotine dependence, cigarettes, uncomplicated: Secondary | ICD-10-CM | POA: Diagnosis not present

## 2021-07-03 DIAGNOSIS — Z7951 Long term (current) use of inhaled steroids: Secondary | ICD-10-CM | POA: Insufficient documentation

## 2021-07-03 DIAGNOSIS — Z791 Long term (current) use of non-steroidal anti-inflammatories (NSAID): Secondary | ICD-10-CM | POA: Diagnosis not present

## 2021-07-03 DIAGNOSIS — K2210 Ulcer of esophagus without bleeding: Secondary | ICD-10-CM | POA: Diagnosis not present

## 2021-07-03 DIAGNOSIS — Z7982 Long term (current) use of aspirin: Secondary | ICD-10-CM | POA: Insufficient documentation

## 2021-07-03 DIAGNOSIS — Z9981 Dependence on supplemental oxygen: Secondary | ICD-10-CM | POA: Diagnosis not present

## 2021-07-03 DIAGNOSIS — Z79899 Other long term (current) drug therapy: Secondary | ICD-10-CM | POA: Diagnosis not present

## 2021-07-03 DIAGNOSIS — K2100 Gastro-esophageal reflux disease with esophagitis, without bleeding: Secondary | ICD-10-CM | POA: Diagnosis not present

## 2021-07-03 DIAGNOSIS — Z880 Allergy status to penicillin: Secondary | ICD-10-CM | POA: Insufficient documentation

## 2021-07-03 HISTORY — DX: Cardiac arrhythmia, unspecified: I49.9

## 2021-07-03 HISTORY — DX: Essential (primary) hypertension: I10

## 2021-07-03 HISTORY — PX: ESOPHAGOGASTRODUODENOSCOPY (EGD) WITH PROPOFOL: SHX5813

## 2021-07-03 SURGERY — ESOPHAGOGASTRODUODENOSCOPY (EGD) WITH PROPOFOL
Anesthesia: General

## 2021-07-03 MED ORDER — GLYCOPYRROLATE 0.2 MG/ML IJ SOLN
INTRAMUSCULAR | Status: DC | PRN
  Administered 2021-07-03: .2 mg via INTRAVENOUS

## 2021-07-03 MED ORDER — SODIUM CHLORIDE 0.9 % IV SOLN: INTRAVENOUS | Status: DC

## 2021-07-03 MED ORDER — PROPOFOL 10 MG/ML IV BOLUS
INTRAVENOUS | Status: DC | PRN
  Administered 2021-07-03: 100 mg via INTRAVENOUS
  Administered 2021-07-03: 30 mg via INTRAVENOUS

## 2021-07-03 MED ORDER — LIDOCAINE HCL (CARDIAC) PF 100 MG/5ML IV SOSY
PREFILLED_SYRINGE | INTRAVENOUS | Status: DC | PRN
  Administered 2021-07-03: 100 mg via INTRAVENOUS

## 2021-07-03 NOTE — Anesthesia Preprocedure Evaluation (Signed)
Anesthesia Evaluation  Patient identified by MRN, date of birth, ID band Patient awake    Reviewed: Allergy & Precautions, NPO status , Patient's Chart, lab work & pertinent test results  History of Anesthesia Complications Negative for: history of anesthetic complications  Airway Mallampati: II  TM Distance: >3 FB Neck ROM: Full    Dental  (+) Poor Dentition, Missing   Pulmonary asthma , sleep apnea , COPD,  COPD inhaler, Current Smoker and Patient abstained from smoking.,    breath sounds clear to auscultation- rhonchi (-) wheezing      Cardiovascular hypertension, Pt. on medications (-) CAD, (-) Past MI, (-) Cardiac Stents and (-) CABG  Rhythm:Regular Rate:Normal - Systolic murmurs and - Diastolic murmurs    Neuro/Psych Seizures - (last seizure 3-4 years ago, not on antiepileptic medication),  PSYCHIATRIC DISORDERS Anxiety Depression Bipolar Disorder    GI/Hepatic negative GI ROS, Neg liver ROS,   Endo/Other  negative endocrine ROSneg diabetes  Renal/GU negative Renal ROS     Musculoskeletal  (+) Arthritis ,   Abdominal (+) + obese,   Peds  Hematology negative hematology ROS (+)   Anesthesia Other Findings Past Medical History: No date: Abnormal Pap smear of cervix No date: Anxiety No date: Arthritis No date: Bipolar 1 disorder (HCC) No date: COPD (chronic obstructive pulmonary disease) (HCC) No date: Depression No date: Dyspnea     Comment:  with exertion  No date: Dysrhythmia No date: History of kidney stones No date: Hyperlipidemia No date: Hypertension No date: Oxygen dependent     Comment:  patient uses 2L oz PRN ; reports hardly ever uses it               unless very SOB   2018: [facility] spotted fever No date: Sarcoidosis of lung (HCC)     Comment:  managed by Dr Lindie Spruce  last seizure 05/2013: Seizures (HCC) No date: Sleep apnea     Comment:  uses CPAP nightly  11/13/2018: UTI (urinary  tract infection)     Comment:  see ED visit in epic ; was dc'd with oral abx and               reports completed and relief of sx    Reproductive/Obstetrics                             Anesthesia Physical Anesthesia Plan  ASA: 3  Anesthesia Plan: General   Post-op Pain Management:    Induction: Intravenous  PONV Risk Score and Plan: 1 and Propofol infusion  Airway Management Planned: Natural Airway  Additional Equipment:   Intra-op Plan:   Post-operative Plan:   Informed Consent: I have reviewed the patients History and Physical, chart, labs and discussed the procedure including the risks, benefits and alternatives for the proposed anesthesia with the patient or authorized representative who has indicated his/her understanding and acceptance.     Dental advisory given  Plan Discussed with: CRNA and Anesthesiologist  Anesthesia Plan Comments:         Anesthesia Quick Evaluation

## 2021-07-03 NOTE — Transfer of Care (Signed)
Immediate Anesthesia Transfer of Care Note  Patient: Nicole Wyatt  Procedure(s) Performed: ESOPHAGOGASTRODUODENOSCOPY (EGD) WITH PROPOFOL  Patient Location: PACU and Endoscopy Unit  Anesthesia Type:General  Level of Consciousness: sedated, drowsy and patient cooperative  Airway & Oxygen Therapy: Patient Spontanous Breathing  Post-op Assessment: Report given to RN and Post -op Vital signs reviewed and stable  Post vital signs: Reviewed and stable  Last Vitals:  Vitals Value Taken Time  BP 103/59 07/03/21 1347  Temp 36.8 C 07/03/21 1347  Pulse 64 07/03/21 1347  Resp 16 07/03/21 1347  SpO2 97 % 07/03/21 1347    Last Pain:  Vitals:   07/03/21 1347  TempSrc: Temporal  PainSc: 0-No pain         Complications: No notable events documented.

## 2021-07-03 NOTE — H&P (Signed)
Wyline Mood, MD [address], [place], [place], [address] [address], [address], [place], [place], [address] Phone: [phone]  Fax: [phone]  Primary Care Physician:  Center, [facility]   Pre-Procedure History & Physical: HPI:  Nicole Wyatt is a 55 y.o. female is here for an endoscopy    Past Medical History:  Diagnosis Date   Abnormal Pap smear of cervix    Anxiety    Arthritis    Bipolar 1 disorder (HCC)    COPD (chronic obstructive pulmonary disease) (HCC)    Depression    Dyspnea    with exertion    Dysrhythmia    History of kidney stones    Hyperlipidemia    Hypertension    Oxygen dependent    patient uses 2L oz PRN ; reports hardly ever uses it unless very SOB     [facility] spotted fever 2018   Sarcoidosis of lung (HCC)    managed by Dr Lindie Spruce    Seizures [facility]) last seizure 05/2013   Sleep apnea    uses CPAP nightly    UTI (urinary tract infection) 11/13/2018   see ED visit in epic ; was dc'd with oral abx and reports completed and relief of sx     Past Surgical History:  Procedure Laterality Date   ABDOMINAL HYSTERECTOMY  1999   BLADDER SURGERY     CARDIAC CATHETERIZATION Bilateral 05/29/2016   Procedure: Right/Left Heart Cath and Coronary Angiography;  Surgeon: Lamar Blinks, MD;  Location: [facility] INVASIVE CV LAB;  Service: Cardiovascular;  Laterality: Bilateral;   COLONOSCOPY     COLONOSCOPY N/A 07/19/2015   Procedure: COLONOSCOPY;  Surgeon: Earline Mayotte, MD;  Location: [facility] ENDOSCOPY;  Service: Endoscopy;  Laterality: N/A;   CYSTOSCOPY WITH STENT PLACEMENT Left 01/05/2020   Procedure: CYSTOSCOPY WITH STENT PLACEMENT;  Surgeon: Riki Altes, MD;  Location: [facility] ORS;  Service: Urology;  Laterality: Left;   CYSTOSCOPY/URETEROSCOPY/HOLMIUM LASER/STENT PLACEMENT Left 01/26/2020   Procedure: CYSTOSCOPY/URETEROSCOPY/HOLMIUM LASER/STENT Exchange;  Surgeon: Riki Altes, MD;  Location: [facility] ORS;  Service:  Urology;  Laterality: Left;   ESOPHAGOGASTRODUODENOSCOPY (EGD) WITH PROPOFOL N/A 03/01/2021   Procedure: ESOPHAGOGASTRODUODENOSCOPY (EGD) WITH PROPOFOL;  Surgeon: Toney Reil, MD;  Location: [facility] ENDOSCOPY;  Service: Gastroenterology;  Laterality: N/A;   FLEXIBLE BRONCHOSCOPY N/A 07/27/2016   Procedure: FLEXIBLE BRONCHOSCOPY;  Surgeon: Yevonne Pax, MD;  Location: [facility] ORS;  Service: Pulmonary;  Laterality: N/A;   FOOT SURGERY     JOINT REPLACEMENT Left    hip   KIDNEY STONE SURGERY  7 years   TOTAL HIP ARTHROPLASTY Left 12/02/2018   Procedure: TOTAL HIP ARTHROPLASTY ANTERIOR APPROACH;  Surgeon: Sheral Apley, MD;  Location: [facility] ORS;  Service: Orthopedics;  Laterality: Left;   TUBAL LIGATION      Prior to Admission medications   Medication Sig Start Date End Date Taking? Authorizing Provider  amLODipine (NORVASC) 10 MG tablet Take 10 mg by mouth daily. 10/10/20  Yes [provider]  budesonide-formoterol (SYMBICORT) 160-4.5 MCG/ACT inhaler Inhale 2 puffs into the lungs 2 (two) times daily. 08/18/20  Yes Coralyn Helling, MD  hydrochlorothiazide (MICROZIDE) 12.5 MG capsule Take 12.5 mg by mouth daily. 09/02/20  Yes [provider]  omeprazole (PRILOSEC) 40 MG capsule Take 1 capsule (40 mg total) by mouth 2 (two) times daily before a meal. 06/06/21  Yes Vanga, Loel Dubonnet, MD  venlafaxine [facility]) 75 MG tablet Take by mouth. 08/03/20  Yes [provider]  albuterol (VENTOLIN HFA) 108 (90 Base) MCG/ACT inhaler Inhale 2 puffs into the lungs every 4 (four) hours as needed for wheezing or shortness of breath. 04/17/21   Irean Hong, MD  Alum & Mag Hydroxide-Simeth (GI COCKTAIL) SUSP suspension Shake well. Each dose to containe 71mL maalox and 70mL viscous lidocaine 01/11/21   Minna Antis, MD  aspirin EC 81 MG tablet Take 1 tablet (81 mg total) by mouth 2 (two) times daily. For DVT prophylaxis for 30 days after surgery. Patient taking differently: Take 81 mg by  mouth daily. 12/02/18   Albina Billet III, PA-C  EPINEPHrine 0.3 mg/0.3 mL IJ SOAJ injection Inject 0.3 mg into the muscle as needed for anaphylaxis.    [provider]  fluticasone (FLONASE) 50 MCG/ACT nasal spray Place 2 sprays into both nostrils daily. Patient taking differently: Place 2 sprays into both nostrils daily as needed for allergies. 02/16/19   Fisher, Roselyn Bering, PA-C  furosemide (LASIX) 40 MG tablet Take 1 tablet (40 mg total) by mouth daily as needed for fluid. Patient taking differently: Take 40 mg by mouth daily as needed for fluid or edema. 01/06/19   Carlean Jews, NP  meloxicam (MOBIC) 15 MG tablet Take 1 tablet (15 mg total) by mouth daily. 11/02/19   Carlean Jews, NP  ondansetron (ZOFRAN) 4 MG tablet Take 4 mg by mouth every 8 (eight) hours as needed. 04/19/21   [provider]  SPIRIVA HANDIHALER 18 MCG inhalation capsule 1 capsule daily. 09/14/20   [provider]    Allergies as of 06/07/2021 - Review Complete 05/29/2021  Allergen Reaction Noted   Bee venom Anaphylaxis 05/29/2016   Ciprofloxacin Nausea Only 06/23/2018   Penicillins Diarrhea and Rash 07/26/2016    Family History  Problem Relation Age of Onset   Hypertension Mother    Arthritis Mother    Heart disease Mother    Heart failure Mother    Hypertension Father    Arthritis Father    Prostate cancer Father    Heart disease Father    Heart attack Father    Arrhythmia Father 74       pacemaker implant   Ovarian cancer Maternal Grandmother     Social History   Socioeconomic History   Marital status: Single    Spouse name: Not on file   Number of children: Not on file   Years of education: Not on file   Highest education level: Not on file  Occupational History   Not on file  Tobacco Use   Smoking status: Every Day    Packs/day: 0.50    Years: 14.00    Pack years: 7.00    Types: Cigarettes   Smokeless tobacco: Never  Vaping Use   Vaping Use: Never  used  Substance and Sexual Activity   Alcohol use: Yes    Alcohol/week: 0.0 standard drinks    Comment: seldom    Drug use: No   Sexual activity: Never  Other Topics Concern   Not on file  Social History Narrative   Not on file   Social Determinants of Health   Financial Resource Strain: Not on file  Food Insecurity: Not on file  Transportation Needs: Not on file  Physical Activity: Not on file  Stress: Not on file  Social Connections: Not on file  Intimate Partner Violence: Not on file    Review of Systems: See HPI, otherwise negative ROS  Physical Exam: BP  121/72   Pulse (!) 58   Temp (!) 97 F (36.1 C) (Temporal)   Resp 20   Ht 5\' 6"  (1.676 m)   Wt 102.1 kg   SpO2 99%   BMI 36.32 kg/m  General:   Alert,  pleasant and cooperative in NAD Head:  Normocephalic and atraumatic. Neck:  Supple; no masses or thyromegaly. Lungs:  Clear throughout to auscultation, normal respiratory effort.    Heart:  +S1, +S2, Regular rate and rhythm, No edema. Abdomen:  Soft, nontender and nondistended. Normal bowel sounds, without guarding, and without rebound.   Neurologic:  Alert and  oriented x4;  grossly normal neurologically.  Impression/Plan: is here for an endoscopy  to be performed for  evaluation of esophagitis    Risks, benefits, limitations, and alternatives regarding endoscopy have been reviewed with the patient.  Questions have been answered.  All parties agreeable.   [name], MD  07/03/2021, 1:27 PM

## 2021-07-03 NOTE — Op Note (Signed)
[facility] Patient Name: Nicole Wyatt Procedure Date: 07/03/2021 1:28 PM MRN: [id number] Account #: [id number] Date of Birth: [birth year] Admit Type: Outpatient Age: 54 Room: [facility] ENDO ROOM 1 Gender: Female Note Status: Finalized Procedure:             Upper GI endoscopy Indications:           Follow-up of reflux esophagitis Providers:             Wyline Mood MD, MD Referring MD:          [facility]inic [facility], MD (Referring MD) Medicines:             Monitored Anesthesia Care Complications:         No immediate complications. Procedure:             Pre-Anesthesia Assessment:                        - Prior to the procedure, a History and Physical was                         performed, and patient medications, allergies and                         sensitivities were reviewed. The patient's tolerance                         of previous anesthesia was reviewed.                        - The risks and benefits of the procedure and the                         sedation options and risks were discussed with the                         patient. All questions were answered and informed                         consent was obtained.                        - ASA Grade Assessment: III - A patient with severe                         systemic disease.                        After obtaining informed consent, the endoscope was                         passed under direct vision. Throughout the procedure,                         the patient's blood pressure, pulse, and oxygen                         saturations were monitored continuously. The Endoscope                         was introduced  through the mouth, and advanced to the                         third part of duodenum. The upper GI endoscopy was                         accomplished with ease. The patient tolerated the                         procedure well. Findings:      The examined  duodenum was normal.      The stomach was normal.      The cardia and gastric fundus were normal on retroflexion.      LA Grade C (one or more mucosal breaks continuous between tops of 2 or       more mucosal folds, less than 75% circumference) esophagitis with no       bleeding was found in the lower third of the esophagus. Biopsies were       taken with a cold forceps for histology. Impression:            - Normal examined duodenum.                        - Normal stomach.                        - LA Grade C reflux esophagitis with no bleeding.                         Biopsied. Recommendation:        - Discharge patient to home (with escort).                        - Resume previous diet.                        - Continue present medications.                        - Await pathology results. Procedure Code(s):     --- Professional ---                        [phone], Esophagogastroduodenoscopy, flexible,                         transoral; with biopsy, single or multiple Diagnosis Code(s):     --- Professional ---                        K21.00, Gastro-esophageal reflux disease with                         esophagitis, without bleeding CPT copyright 2019 American Medical Association. All rights reserved. The codes documented in this report are preliminary and upon coder review may  be revised to meet current compliance requirements. Wyline Mood, MD Wyline Mood MD, MD 07/03/2021 1:45:35 PM This report has been signed electronically. Number of Addenda: 0 Note Initiated On: 07/03/2021 1:28 PM Estimated Blood Loss:  Estimated blood loss: none.      [facility]

## 2021-07-03 NOTE — Anesthesia Postprocedure Evaluation (Signed)
Anesthesia Post Note  Patient: Nicole Wyatt  Procedure(s) Performed: ESOPHAGOGASTRODUODENOSCOPY (EGD) WITH PROPOFOL  Patient location during evaluation: Phase II Anesthesia Type: General Level of consciousness: awake and alert, awake and oriented Pain management: pain level controlled Vital Signs Assessment: post-procedure vital signs reviewed and stable Respiratory status: spontaneous breathing, nonlabored ventilation and respiratory function stable Cardiovascular status: blood pressure returned to baseline and stable Postop Assessment: no apparent nausea or vomiting Anesthetic complications: no   No notable events documented.   Last Vitals:  Vitals:   07/03/21 1357 07/03/21 1407  BP: 120/74   Pulse:    Resp: 20 20  Temp:    SpO2:      Last Pain:  Vitals:   07/03/21 1407  TempSrc:   PainSc: 0-No pain                 Manfred Arch

## 2021-07-04 ENCOUNTER — Encounter: Payer: Self-pay | Admitting: Gastroenterology

## 2021-07-04 LAB — SURGICAL PATHOLOGY

## 2021-07-13 ENCOUNTER — Encounter: Payer: Self-pay | Admitting: Gastroenterology

## 2021-07-13 NOTE — Progress Notes (Signed)
ok 

## 2021-07-20 ENCOUNTER — Ambulatory Visit: Payer: Medicare Other | Attending: Gastroenterology

## 2021-09-05 ENCOUNTER — Other Ambulatory Visit: Payer: Self-pay | Admitting: Gastroenterology

## 2021-09-21 ENCOUNTER — Emergency Department: Payer: Medicare Other

## 2021-09-21 ENCOUNTER — Emergency Department
Admission: EM | Admit: 2021-09-21 | Discharge: 2021-09-21 | Disposition: A | Discharge status: Home / Self Care | Payer: Medicare Other | Attending: Emergency Medicine | Admitting: Emergency Medicine

## 2021-09-21 ENCOUNTER — Other Ambulatory Visit: Payer: Self-pay

## 2021-09-21 DIAGNOSIS — Z7952 Long term (current) use of systemic steroids: Secondary | ICD-10-CM | POA: Insufficient documentation

## 2021-09-21 DIAGNOSIS — J189 Pneumonia, unspecified organism: Secondary | ICD-10-CM | POA: Insufficient documentation

## 2021-09-21 DIAGNOSIS — Z7982 Long term (current) use of aspirin: Secondary | ICD-10-CM | POA: Diagnosis not present

## 2021-09-21 DIAGNOSIS — Z20822 Contact with and (suspected) exposure to covid-19: Secondary | ICD-10-CM | POA: Insufficient documentation

## 2021-09-21 DIAGNOSIS — J449 Chronic obstructive pulmonary disease, unspecified: Secondary | ICD-10-CM | POA: Insufficient documentation

## 2021-09-21 DIAGNOSIS — F17210 Nicotine dependence, cigarettes, uncomplicated: Secondary | ICD-10-CM | POA: Diagnosis not present

## 2021-09-21 DIAGNOSIS — Z79899 Other long term (current) drug therapy: Secondary | ICD-10-CM | POA: Insufficient documentation

## 2021-09-21 DIAGNOSIS — R6883 Chills (without fever): Secondary | ICD-10-CM | POA: Diagnosis not present

## 2021-09-21 DIAGNOSIS — R112 Nausea with vomiting, unspecified: Secondary | ICD-10-CM | POA: Insufficient documentation

## 2021-09-21 DIAGNOSIS — Z96642 Presence of left artificial hip joint: Secondary | ICD-10-CM | POA: Diagnosis not present

## 2021-09-21 DIAGNOSIS — J4520 Mild intermittent asthma, uncomplicated: Secondary | ICD-10-CM | POA: Insufficient documentation

## 2021-09-21 DIAGNOSIS — R109 Unspecified abdominal pain: Secondary | ICD-10-CM | POA: Diagnosis present

## 2021-09-21 LAB — COMPREHENSIVE METABOLIC PANEL
ALT: 14 U/L (ref 0–44)
AST: 18 U/L (ref 15–41)
Albumin: 4.3 g/dL (ref 3.5–5.0)
Alkaline Phosphatase: 82 U/L (ref 38–126)
Anion gap: 10 (ref 5–15)
BUN: 12 mg/dL (ref 6–20)
CO2: 27 mmol/L (ref 22–32)
Calcium: 8.7 mg/dL — ABNORMAL LOW (ref 8.9–10.3)
Chloride: 101 mmol/L (ref 98–111)
Creatinine, Ser: 0.62 mg/dL (ref 0.44–1.00)
GFR, Estimated: >60 mL/min (ref >60)
Glucose, Bld: 97 mg/dL (ref 70–99)
Potassium: 3.6 mmol/L (ref 3.5–5.1)
Sodium: 138 mmol/L (ref 135–145)
Total Bilirubin: 0.7 mg/dL (ref 0.3–1.2)
Total Protein: 7.9 g/dL (ref 6.5–8.1)

## 2021-09-21 LAB — CBC WITH DIFFERENTIAL/PLATELET
Abs Immature Granulocytes: 0.03 K/uL (ref 0.00–0.07)
Basophils Absolute: 0 K/uL (ref 0.0–0.1)
Basophils Relative: 0 %
Eosinophils Absolute: 0.1 K/uL (ref 0.0–0.5)
Eosinophils Relative: 1 %
HCT: 40.1 % (ref 36.0–46.0)
Hemoglobin: 13.8 g/dL (ref 12.0–15.0)
Immature Granulocytes: 0 %
Lymphocytes Relative: 21 %
Lymphs Abs: 2.3 K/uL (ref 0.7–4.0)
MCH: 33.6 pg (ref 26.0–34.0)
MCHC: 34.4 g/dL (ref 30.0–36.0)
MCV: 97.6 fL (ref 80.0–100.0)
Monocytes Absolute: 0.6 K/uL (ref 0.1–1.0)
Monocytes Relative: 6 %
Neutro Abs: 7.6 K/uL (ref 1.7–7.7)
Neutrophils Relative %: 72 %
Platelets: 258 K/uL (ref 150–400)
RBC: 4.11 MIL/uL (ref 3.87–5.11)
RDW: 13.2 % (ref 11.5–15.5)
WBC: 10.6 K/uL — ABNORMAL HIGH (ref 4.0–10.5)
nRBC: 0 % (ref 0.0–0.2)

## 2021-09-21 LAB — URINALYSIS, ROUTINE W REFLEX MICROSCOPIC
Bilirubin Urine: NEGATIVE
Glucose, UA: NEGATIVE mg/dL
Nitrite: NEGATIVE
Protein, ur: NEGATIVE mg/dL
Specific Gravity, Urine: 1.02 (ref 1.005–1.030)
pH: 7 (ref 5.0–8.0)

## 2021-09-21 LAB — URINALYSIS, MICROSCOPIC (REFLEX)
Bacteria, UA: NONE SEEN
Squamous Epithelial / HPF: NONE SEEN (ref 0–5)

## 2021-09-21 LAB — LIPASE, BLOOD: Lipase: 40 U/L (ref 11–51)

## 2021-09-21 MED ORDER — ONDANSETRON 8 MG PO TBDP
8.0000 mg | ORAL_TABLET | Freq: Once | ORAL | Status: AC
  Administered 2021-09-21: 8 mg via ORAL
  Filled 2021-09-21: qty 1

## 2021-09-21 MED ORDER — ONDANSETRON 4 MG PO TBDP: 4.0000 mg | ORAL_TABLET | Freq: Three times a day (TID) | ORAL | 0 refills | Status: DC | PRN

## 2021-09-21 MED ORDER — MELOXICAM 15 MG PO TABS: 15.0000 mg | ORAL_TABLET | Freq: Every day | ORAL | 0 refills | Status: AC

## 2021-09-21 MED ORDER — ONDANSETRON 4 MG PO TBDP
4.0000 mg | ORAL_TABLET | Freq: Once | ORAL | Status: AC
  Administered 2021-09-21: 4 mg via ORAL
  Filled 2021-09-21: qty 1

## 2021-09-21 MED ORDER — KETOROLAC TROMETHAMINE 30 MG/ML IJ SOLN
30.0000 mg | Freq: Once | INTRAMUSCULAR | Status: AC
  Administered 2021-09-21: 30 mg via INTRAMUSCULAR
  Filled 2021-09-21: qty 1

## 2021-09-21 MED ORDER — AZITHROMYCIN 250 MG PO TABS: ORAL_TABLET | ORAL | 0 refills | Status: DC

## 2021-09-21 MED ORDER — AZITHROMYCIN 500 MG PO TABS
500.0000 mg | ORAL_TABLET | Freq: Once | ORAL | Status: AC
  Administered 2021-09-21: 500 mg via ORAL
  Filled 2021-09-21: qty 1

## 2021-09-21 NOTE — ED Provider Notes (Signed)
HPI: Pt is a 55 y.o. female who presents with complaints of flank pain  The patient p/w  left flank pain similar to prior kidney stones. Took tylenol 2 hours ago. Reports chills and nausea and needing emergency kidney stent in past.   ROS:  chest pain, vomiting  Past Medical History:  Diagnosis Date   Abnormal Pap smear of cervix    Anxiety    Arthritis    Bipolar 1 disorder (HCC)    COPD (chronic obstructive pulmonary disease) (HCC)    Depression    Dyspnea    with exertion    Dysrhythmia    History of kidney stones    Hyperlipidemia    Hypertension    Oxygen dependent    patient uses 2L oz PRN ; reports hardly ever uses it unless very SOB     [facility] spotted fever 2018   Sarcoidosis of lung (HCC)    managed by Dr Lindie Spruce    Seizures [facility]) last seizure 05/2013   Sleep apnea    uses CPAP nightly    UTI (urinary tract infection) 11/13/2018   see ED visit in epic ; was dc'd with oral abx and reports completed and relief of sx    Vitals:   09/21/21 1836  BP: 135/77  Pulse: 72  Resp: 18  Temp: 98.9 F (37.2 C)  SpO2: 99%    Focused Physical Exam: Gen: No acute distress Head: atraumatic, normocephalic Eyes: Extraocular movements grossly intact; conjunctiva clear CV: RRR Lung: No increased WOB, no stridor GI: ND, no obvious masses, slight tender L flank and LLQ  Neuro: Alert and awake  Medical Decision Making and Plan: Given the patient's initial medical screening exam, the following diagnostic evaluation has been ordered. The patient will be placed in the appropriate treatment space, once one is available, to complete the evaluation and treatment. I have discussed the plan of care with the patient and I have advised the patient that an ED physician or mid-level practitioner will reevaluate their condition after the test results have been received, as the results may give them additional insight into the type of treatment they may need.   Diagnostics: labs, CT    Treatments: zofran    Concha Se, MD 09/21/21 [phone]

## 2021-09-21 NOTE — ED Triage Notes (Signed)
C/O left LLQ/Flank pain.  States has been trying to pass a kidney stone for 6 months, over past day c/o chills, and vomiting/nausea.  AAOx3.  Skin warm and dry. NAD

## 2021-09-21 NOTE — ED Provider Notes (Signed)
[facility] Emergency Department Provider Note  ____________________________________________  Time seen: Approximately 8:27 PM  I have reviewed the triage vital signs and the nursing notes.   HISTORY  Chief Complaint Flank Pain    HPI Nicole Wyatt is a 55 y.o. female who presents the emergency department complaining of flank pain, chills, nausea, emesis.  Patient states that she has had symptoms for 5 days.  This initially was mostly on her rib/flank region.  She is had no cough or shortness of breath.  No bloody emesis.  No diarrhea or constipation.  Patient has had no dysuria, polyuria or hematuria.  She does have a history of nephrolithiasis on this side and states that she has had to have a stent placed in the past for kidney stones.  Given the progression of symptoms she felt like she should be evaluated to ensure that she did not have a kidney stone.  No vaginal bleeding or discharge.  No chest pain.  Medical history as described below.       Past Medical History:  Diagnosis Date   Abnormal Pap smear of cervix    Anxiety    Arthritis    Bipolar 1 disorder (HCC)    COPD (chronic obstructive pulmonary disease) (HCC)    Depression    Dyspnea    with exertion    Dysrhythmia    History of kidney stones    Hyperlipidemia    Hypertension    Oxygen dependent    patient uses 2L oz PRN ; reports hardly ever uses it unless very SOB     [facility] spotted fever 2018   Sarcoidosis of lung (HCC)    managed by Dr Lindie Spruce    Seizures [facility]) last seizure 05/2013   Sleep apnea    uses CPAP nightly    UTI (urinary tract infection) 11/13/2018   see ED visit in epic ; was dc'd with oral abx and reports completed and relief of sx     Patient Active Problem List   Diagnosis Date Noted   Kidney stone 01/05/2020   Atopic dermatitis 09/08/2019   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/08/2019   Acute cystitis with hematuria 09/08/2019   Other fatigue  07/19/2019   Hypoglycemia 07/19/2019   Arm weakness 03/07/2019   Influenza A 02/24/2019   Acute upper respiratory infection 02/24/2019   Chronic obstructive pulmonary disease (HCC) 02/24/2019   Acute otitis externa of both ears 01/06/2019   Mild intermittent asthma without complication 01/06/2019   Primary osteoarthritis of hip 12/02/2018   Primary osteoarthritis of left hip 10/13/2018   OSA (obstructive sleep apnea) 10/13/2018   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   [place] spotted fever 07/02/2018   Urinary tract infection without hematuria 07/02/2018   Dysuria 06/23/2018   Depression, major, recurrent, moderate (HCC) 03/26/2018   Generalized edema 03/26/2018   Anaphylactic syndrome 03/26/2018   Cutaneous candidiasis 03/26/2018   Bursitis of hip 02/03/2018   Osteoarthritis of knee 02/03/2018   Chronic pulmonary hypertension (HCC) 06/18/2016   S/P cardiac cath 06/18/2016   Unstable angina (HCC) 05/24/2016   Bilateral leg edema 05/22/2016   Shortness of breath 04/30/2016   Skin lesion of right arm 03/01/2016   Screening for breast cancer 06/10/2015   Skin lesion of breast 06/10/2015   Hx of seizure disorder 06/24/2014   Left-sided weakness 06/24/2014   Tobacco abuse 06/24/2014    Past Surgical History:  Procedure Laterality Date   ABDOMINAL HYSTERECTOMY  1999   BLADDER SURGERY  CARDIAC CATHETERIZATION Bilateral 05/29/2016   Procedure: Right/Left Heart Cath and Coronary Angiography;  Surgeon: Lamar Blinks, MD;  Location: [facility] INVASIVE CV LAB;  Service: Cardiovascular;  Laterality: Bilateral;   COLONOSCOPY     COLONOSCOPY N/A 07/19/2015   Procedure: COLONOSCOPY;  Surgeon: Earline Mayotte, MD;  Location: [facility] ENDOSCOPY;  Service: Endoscopy;  Laterality: N/A;   CYSTOSCOPY WITH STENT PLACEMENT Left 01/05/2020   Procedure: CYSTOSCOPY WITH STENT PLACEMENT;  Surgeon: Riki Altes, MD;  Location: [facility] ORS;  Service: Urology;  Laterality: Left;    CYSTOSCOPY/URETEROSCOPY/HOLMIUM LASER/STENT PLACEMENT Left 01/26/2020   Procedure: CYSTOSCOPY/URETEROSCOPY/HOLMIUM LASER/STENT Exchange;  Surgeon: Riki Altes, MD;  Location: [facility] ORS;  Service: Urology;  Laterality: Left;   ESOPHAGOGASTRODUODENOSCOPY (EGD) WITH PROPOFOL N/A 03/01/2021   Procedure: ESOPHAGOGASTRODUODENOSCOPY (EGD) WITH PROPOFOL;  Surgeon: Toney Reil, MD;  Location: [facility] ENDOSCOPY;  Service: Gastroenterology;  Laterality: N/A;   ESOPHAGOGASTRODUODENOSCOPY (EGD) WITH PROPOFOL N/A 07/03/2021   Procedure: ESOPHAGOGASTRODUODENOSCOPY (EGD) WITH PROPOFOL;  Surgeon: Wyline Mood, MD;  Location: [facility] ENDOSCOPY;  Service: Gastroenterology;  Laterality: N/A;   FLEXIBLE BRONCHOSCOPY N/A 07/27/2016   Procedure: FLEXIBLE BRONCHOSCOPY;  Surgeon: Yevonne Pax, MD;  Location: [facility] ORS;  Service: Pulmonary;  Laterality: N/A;   FOOT SURGERY     JOINT REPLACEMENT Left    hip   KIDNEY STONE SURGERY  7 years   TOTAL HIP ARTHROPLASTY Left 12/02/2018   Procedure: TOTAL HIP ARTHROPLASTY ANTERIOR APPROACH;  Surgeon: Sheral Apley, MD;  Location: [facility] ORS;  Service: Orthopedics;  Laterality: Left;   TUBAL LIGATION      Prior to Admission medications   Medication Sig Start Date End Date Taking? Authorizing Provider  azithromycin (ZITHROMAX Z-PAK) 250 MG tablet Take 2 tablets (500 mg) on  Day 1,  followed by 1 tablet (250 mg) once daily on Days 2 through 5. 09/21/21  Yes Neyra Pettie, Delorise Royals, PA-C  meloxicam (MOBIC) 15 MG tablet Take 1 tablet (15 mg total) by mouth daily. 09/21/21  Yes Alajia Schmelzer, Delorise Royals, PA-C  ondansetron (ZOFRAN-ODT) 4 MG disintegrating tablet Take 1 tablet (4 mg total) by mouth every 8 (eight) hours as needed for nausea or vomiting. 09/21/21  Yes Bryann Gentz, Delorise Royals, PA-C  albuterol (VENTOLIN HFA) 108 (90 Base) MCG/ACT inhaler Inhale 2 puffs into the lungs every 4 (four) hours as needed for wheezing or shortness of breath. 04/17/21   Irean Hong, MD  Alum & Mag  Hydroxide-Simeth (GI COCKTAIL) SUSP suspension Shake well. Each dose to containe 34mL maalox and 46mL viscous lidocaine 01/11/21   Minna Antis, MD  amLODipine (NORVASC) 10 MG tablet Take 10 mg by mouth daily. 10/10/20   [provider]  aspirin EC 81 MG tablet Take 1 tablet (81 mg total) by mouth 2 (two) times daily. For DVT prophylaxis for 30 days after surgery. Patient taking differently: Take 81 mg by mouth daily. 12/02/18   Martensen, Lucretia Kern III, PA-C  budesonide-formoterol (SYMBICORT) 160-4.5 MCG/ACT inhaler Inhale 2 puffs into the lungs 2 (two) times daily. 08/18/20   Coralyn Helling, MD  EPINEPHrine 0.3 mg/0.3 mL IJ SOAJ injection Inject 0.3 mg into the muscle as needed for anaphylaxis.    [provider]  fluticasone (FLONASE) 50 MCG/ACT nasal spray Place 2 sprays into both nostrils daily. Patient taking differently: Place 2 sprays into both nostrils daily as needed for allergies. 02/16/19   Fisher, Roselyn Bering, PA-C  furosemide (LASIX) 40 MG tablet Take 1 tablet (40 mg total) by mouth daily as needed  for fluid. Patient taking differently: Take 40 mg by mouth daily as needed for fluid or edema. 01/06/19   Carlean Jews, NP  hydrochlorothiazide (MICROZIDE) 12.5 MG capsule Take 12.5 mg by mouth daily. 09/02/20   [provider]  omeprazole (PRILOSEC) 40 MG capsule TAKE (1) CAPSULE TWICE DAILY BEFORE A MEAL 09/05/21   Vanga, Loel Dubonnet, MD  ondansetron (ZOFRAN) 4 MG tablet Take 4 mg by mouth every 8 (eight) hours as needed. 04/19/21   [provider]  SPIRIVA HANDIHALER 18 MCG inhalation capsule 1 capsule daily. 09/14/20   [provider]  venlafaxine (EFFEXOR) 75 MG tablet Take by mouth. 08/03/20   [provider]    Allergies Bee venom, Ciprofloxacin, and Penicillins  Family History  Problem Relation Age of Onset   Hypertension Mother    Arthritis Mother    Heart disease Mother    Heart failure Mother    Hypertension Father     Arthritis Father    Prostate cancer Father    Heart disease Father    Heart attack Father    Arrhythmia Father 40       pacemaker implant   Ovarian cancer Maternal Grandmother     Social History Social History   Tobacco Use   Smoking status: Every Day    Packs/day: 0.50    Years: 14.00    Pack years: 7.00    Types: Cigarettes   Smokeless tobacco: Never  Vaping Use   Vaping Use: Never used  Substance Use Topics   Alcohol use: Yes    Alcohol/week: 0.0 standard drinks    Comment: seldom    Drug use: No     Review of Systems  Constitutional: Positive fever/chills Eyes: No visual changes. No discharge ENT: No upper respiratory complaints. Cardiovascular: no chest pain. Respiratory: no cough. No SOB. Gastrointestinal: No abdominal pain.  No nausea, no vomiting.  No diarrhea.  No constipation. Genitourinary: Positive for flank pain.  Negative for dysuria. No hematuria Musculoskeletal: Negative for musculoskeletal pain. Skin: Negative for rash, abrasions, lacerations, ecchymosis. Neurological: Negative for headaches, focal weakness or numbness.  10 System ROS otherwise negative.  ____________________________________________   PHYSICAL EXAM:  VITAL SIGNS: ED Triage Vitals  Enc Vitals Group     BP 09/21/21 1836 135/77     Pulse Rate 09/21/21 1836 72     Resp 09/21/21 1836 18     Temp 09/21/21 1836 98.9 F (37.2 C)     Temp Source 09/21/21 1836 Oral     SpO2 09/21/21 1836 99 %     Weight 09/21/21 1836 235 lb (106.6 kg)     Height 09/21/21 1836 5\' 6"  (1.676 m)     Head Circumference --      Peak Flow --      Pain Score 09/21/21 1846 10     Pain Loc --      Pain Edu? --      Excl. in GC? --      Constitutional: Alert and oriented. Well appearing and in no acute distress. Eyes: Conjunctivae are normal. PERRL. EOMI. Head: Atraumatic. ENT:      Ears:       Nose: No congestion/rhinnorhea.      Mouth/Throat: Mucous membranes are moist.  Neck: No stridor.    Hematological/Lymphatic/Immunilogical: No cervical lymphadenopathy. Cardiovascular: Normal rate, regular rhythm. Normal S1 and S2.  Good peripheral circulation. Respiratory: Normal respiratory effort without tachypnea or retractions. Lungs CTAB without significant wheezing, rales or rhonchi. Good  air entry to the bases with no decreased or absent breath sounds. Gastrointestinal: Bowel sounds 4 quadrants. Soft and nontender to palpation all quadrants.  No palpable abnormality.. No guarding or rigidity. No palpable masses. No distention. No CVA tenderness. Musculoskeletal: Full range of motion to all extremities. No gross deformities appreciated. Neurologic:  Normal speech and language. No gross focal neurologic deficits are appreciated.  Skin:  Skin is warm, dry and intact. No rash noted. Psychiatric: Mood and affect are normal. Speech and behavior are normal. Patient exhibits appropriate insight and judgement.   ____________________________________________   LABS (all labs ordered are listed, but only abnormal results are displayed)  Labs Reviewed  CBC WITH DIFFERENTIAL/PLATELET - Abnormal; Notable for the following components:      Result Value   WBC 10.6 (*)    All other components within normal limits  COMPREHENSIVE METABOLIC PANEL - Abnormal; Notable for the following components:   Calcium 8.7 (*)    All other components within normal limits  URINALYSIS, ROUTINE W REFLEX MICROSCOPIC - Abnormal; Notable for the following components:   Hgb urine dipstick TRACE (*)    Ketones, ur TRACE (*)    Leukocytes,Ua TRACE (*)    All other components within normal limits  SARS CORONAVIRUS 2 (TAT 6-24 HRS)  LIPASE, BLOOD  URINALYSIS, MICROSCOPIC (REFLEX)   ____________________________________________  EKG   ____________________________________________  RADIOLOGY I personally viewed and evaluated these images as part of my medical decision making, as well as reviewing the written  report by the radiologist.  ED Provider Interpretation: No evidence of nephrolithiasis, findings consistent with likely pneumonia, given bilateral nature and other symptoms, likely viral  CT Renal Stone Study  Result Date: 09/21/2021 CLINICAL DATA:  Left lower quadrant and left flank pain EXAM: CT ABDOMEN AND PELVIS WITHOUT CONTRAST TECHNIQUE: Multidetector CT imaging of the abdomen and pelvis was performed following the standard protocol without IV contrast. COMPARISON:  None. FINDINGS: Lower chest: Bibasilar centrilobular and peribronchial ground-glass pulmonary infiltrate is present, likely infectious, as can be seen the setting of community-acquired pneumonia, or aspiration. Debris is seen within the distal esophagus possibly related to esophageal dysmotility or gastroesophageal reflux. The visualized heart and pericardium are unremarkable. Hepatobiliary: No focal liver abnormality is seen. No gallstones, gallbladder wall thickening, or biliary dilatation. Pancreas: Unremarkable Spleen: Unremarkable Adrenals/Urinary Tract: 22 mm adenoma noted within the a right adrenal gland, unchanged nodular thickening of the body of the left adrenal gland is unchanged. The kidneys are normal in size and position. No hydronephrosis. Scattered punctate 1-2 mm nonobstructing calculi are noted within the upper pole of the left kidney and within the upper and lower pole of the right kidney. No ureteral calculi. No perinephric inflammatory stranding or fluid collections identified. The bladder is decompressed and is unremarkable. Stomach/Bowel: Stomach is within normal limits. Appendix appears normal. No evidence of bowel wall thickening, distention, or inflammatory changes. No free intraperitoneal gas or fluid. Vascular/Lymphatic: Minimal aortoiliac atherosclerotic calcification. No aortic aneurysm. No pathologic adenopathy within the abdomen and pelvis. Reproductive: Streak artifact slightly limits evaluation of the pelvis.  The uterus is absent. Stable fullness of the left ovary, with somewhat limited evaluation secondary to extensive streak artifact. This has, however, decreased in overall size when compared to remote prior examination of 03/07/2019. Other: Tiny fat containing umbilical hernia noted. The rectum is unremarkable. Musculoskeletal: Left total hip arthroplasty has been performed. Degenerative changes are seen within the lumbar spine. No acute bone abnormality. No lytic or blastic bone lesion. IMPRESSION: Bibasilar  pulmonary infiltrates, infection versus aspiration. Debris also noted within the distal esophagus, possibly reflecting changes of gastroesophageal reflux or esophageal dysmotility. Minimal bilateral nonobstructing nephrolithiasis. No urolithiasis. No hydronephrosis. Stable 22 mm right adrenal adenoma. Aortic Atherosclerosis (ICD10-I70.0). Electronically Signed   By: Helyn Numbers M.D.   On: 09/21/2021 19:22    ____________________________________________    PROCEDURES  Procedure(s) performed:    Procedures    Medications  ondansetron (ZOFRAN-ODT) disintegrating tablet 4 mg (4 mg Oral Given 09/21/21 1847)  ketorolac (TORADOL) 30 MG/ML injection 30 mg (30 mg Intramuscular Given 09/21/21 2037)  azithromycin (ZITHROMAX) tablet 500 mg (500 mg Oral Given 09/21/21 2035)  ondansetron (ZOFRAN-ODT) disintegrating tablet 8 mg (8 mg Oral Given 09/21/21 2037)     ____________________________________________   INITIAL IMPRESSION / ASSESSMENT AND PLAN / ED COURSE  Pertinent labs & imaging results that were available during my care of the patient were reviewed by me and considered in my medical decision making (see chart for details).  Review of the [place] CSRS was performed in accordance of the [facility] prior to dispensing any controlled drugs.           Patient's diagnosis is consistent with flank pain, community-acquired pneumonia.  Patient presented to the emergency department complaining of  multiple symptoms.  Patient was concerned that she may have a kidney stone as she had had flank pain and history of previous nephrolithiasis requiring stent.  CT scan revealed no evidence of nephrolithiasis, pyelonephritis, hydronephrosis.  No other significant findings in the abdomen.  Patient does have findings consistent with bilateral pneumonia.  Given the fevers, chills, flank pain, emesis and bilateral pneumonia I suspect that this is viral in nature.  She will have COVID test at this time.  Will cover with antibiotics just to ensure no bacterial pneumonia.  Patient will also have of other symptom control medications for flank pain and nausea and vomiting.  Return precautions discussed with the patient.  Follow-up primary care as needed. Patient is given ED precautions to return to the ED for any worsening or new symptoms.     ____________________________________________  FINAL CLINICAL IMPRESSION(S) / ED DIAGNOSES  Final diagnoses:  Flank pain  Community acquired pneumonia, unspecified laterality      NEW MEDICATIONS STARTED DURING THIS VISIT:  ED Discharge Orders          Ordered    ondansetron (ZOFRAN-ODT) 4 MG disintegrating tablet  Every 8 hours PRN        09/21/21 2051    azithromycin (ZITHROMAX Z-PAK) 250 MG tablet        09/21/21 2051    meloxicam (MOBIC) 15 MG tablet  Daily        09/21/21 2051                This chart was dictated using voice recognition software/Dragon. Despite best efforts to proofread, errors can occur which can change the meaning. Any change was purely unintentional.    Racheal Patches, PA-C 09/21/21 2054    Gilles Chiquito, MD 09/21/21 2100

## 2021-09-21 NOTE — ED Triage Notes (Signed)
Pt here with left flank pain for 2 weeks but it has gotten worse over time. Pt states she is having chills and nausea and severe pain. Pt denies diarrhea.

## 2021-09-22 LAB — SARS CORONAVIRUS 2 (TAT 6-24 HRS): SARS Coronavirus 2: NEGATIVE

## 2021-12-18 ENCOUNTER — Emergency Department
Admission: EM | Admit: 2021-12-18 | Discharge: 2021-12-18 | Disposition: A | Discharge status: Home / Self Care | Payer: Medicare Other | Attending: Student in an Organized Health Care Education/Training Program | Admitting: Student in an Organized Health Care Education/Training Program

## 2021-12-18 ENCOUNTER — Other Ambulatory Visit: Payer: Self-pay

## 2021-12-18 ENCOUNTER — Emergency Department: Payer: Medicare Other

## 2021-12-18 ENCOUNTER — Encounter: Payer: Self-pay | Admitting: Emergency Medicine

## 2021-12-18 DIAGNOSIS — Z79899 Other long term (current) drug therapy: Secondary | ICD-10-CM | POA: Insufficient documentation

## 2021-12-18 DIAGNOSIS — I10 Essential (primary) hypertension: Secondary | ICD-10-CM | POA: Insufficient documentation

## 2021-12-18 DIAGNOSIS — J45909 Unspecified asthma, uncomplicated: Secondary | ICD-10-CM | POA: Insufficient documentation

## 2021-12-18 DIAGNOSIS — F17210 Nicotine dependence, cigarettes, uncomplicated: Secondary | ICD-10-CM | POA: Insufficient documentation

## 2021-12-18 DIAGNOSIS — Z8719 Personal history of other diseases of the digestive system: Secondary | ICD-10-CM | POA: Diagnosis not present

## 2021-12-18 DIAGNOSIS — J449 Chronic obstructive pulmonary disease, unspecified: Secondary | ICD-10-CM | POA: Diagnosis not present

## 2021-12-18 DIAGNOSIS — R109 Unspecified abdominal pain: Secondary | ICD-10-CM | POA: Insufficient documentation

## 2021-12-18 DIAGNOSIS — Z96642 Presence of left artificial hip joint: Secondary | ICD-10-CM | POA: Diagnosis not present

## 2021-12-18 LAB — CBC
HCT: 40.9 % (ref 36.0–46.0)
Hemoglobin: 13.6 g/dL (ref 12.0–15.0)
MCH: 32.4 pg (ref 26.0–34.0)
MCHC: 33.3 g/dL (ref 30.0–36.0)
MCV: 97.4 fL (ref 80.0–100.0)
Platelets: 234 10*3/uL (ref 150–400)
RBC: 4.2 MIL/uL (ref 3.87–5.11)
RDW: 13.4 % (ref 11.5–15.5)
WBC: 5.1 10*3/uL (ref 4.0–10.5)
nRBC: 0 % (ref 0.0–0.2)

## 2021-12-18 LAB — COMPREHENSIVE METABOLIC PANEL
ALT: 12 U/L (ref 0–44)
AST: 14 U/L — ABNORMAL LOW (ref 15–41)
Albumin: 4.1 g/dL (ref 3.5–5.0)
Alkaline Phosphatase: 74 U/L (ref 38–126)
Anion gap: 6 (ref 5–15)
BUN: 11 mg/dL (ref 6–20)
CO2: 31 mmol/L (ref 22–32)
Calcium: 9 mg/dL (ref 8.9–10.3)
Chloride: 103 mmol/L (ref 98–111)
Creatinine, Ser: 0.39 mg/dL — ABNORMAL LOW (ref 0.44–1.00)
GFR, Estimated: >60 mL/min (ref >60)
Glucose, Bld: 92 mg/dL (ref 70–99)
Potassium: 3 mmol/L — ABNORMAL LOW (ref 3.5–5.1)
Sodium: 140 mmol/L (ref 135–145)
Total Bilirubin: 0.6 mg/dL (ref 0.3–1.2)
Total Protein: 8.4 g/dL — ABNORMAL HIGH (ref 6.5–8.1)

## 2021-12-18 LAB — URINALYSIS, COMPLETE (UACMP) WITH MICROSCOPIC
Bilirubin Urine: NEGATIVE
Glucose, UA: NEGATIVE mg/dL
Ketones, ur: 15 mg/dL — AB
Nitrite: NEGATIVE
Protein, ur: 100 mg/dL — AB
Specific Gravity, Urine: 1.01 (ref 1.005–1.030)
pH: 7 (ref 5.0–8.0)

## 2021-12-18 MED ORDER — OXYCODONE-ACETAMINOPHEN 5-325 MG PO TABS
1.0000 | ORAL_TABLET | Freq: Once | ORAL | Status: AC
  Administered 2021-12-18: 12:00:00 1 via ORAL
  Filled 2021-12-18: qty 1

## 2021-12-18 MED ORDER — ONDANSETRON HCL 4 MG/2ML IJ SOLN
4.0000 mg | Freq: Once | INTRAMUSCULAR | Status: AC
  Administered 2021-12-18: 09:00:00 4 mg via INTRAVENOUS
  Filled 2021-12-18: qty 2

## 2021-12-18 MED ORDER — OXYCODONE-ACETAMINOPHEN 5-325 MG PO TABS: 1.0000 | ORAL_TABLET | ORAL | 0 refills | Status: DC | PRN

## 2021-12-18 MED ORDER — IOHEXOL 300 MG/ML  SOLN
100.0000 mL | Freq: Once | INTRAMUSCULAR | Status: AC | PRN
  Administered 2021-12-18: 11:00:00 100 mL via INTRAVENOUS
  Filled 2021-12-18: qty 100

## 2021-12-18 MED ORDER — POTASSIUM CHLORIDE CRYS ER 20 MEQ PO TBCR
40.0000 meq | EXTENDED_RELEASE_TABLET | Freq: Once | ORAL | Status: AC
  Administered 2021-12-18: 12:00:00 40 meq via ORAL
  Filled 2021-12-18: qty 2

## 2021-12-18 MED ORDER — MORPHINE SULFATE (PF) 4 MG/ML IV SOLN
4.0000 mg | INTRAVENOUS | Status: DC | PRN
  Administered 2021-12-18: 09:00:00 4 mg via INTRAVENOUS
  Filled 2021-12-18: qty 1

## 2021-12-18 NOTE — ED Notes (Signed)
Pt unable to void at this time. 

## 2021-12-18 NOTE — ED Notes (Signed)
Pt still not able to provide a urine sample. Advised to let RN know when she could

## 2021-12-18 NOTE — Discharge Instructions (Signed)

## 2021-12-18 NOTE — ED Triage Notes (Signed)
Pt states she has a hx of back spasms in the past and had to have injections for them

## 2021-12-18 NOTE — ED Notes (Signed)
Pt verbalizes understanding of d/c instructions, medications and follow up 

## 2021-12-18 NOTE — ED Notes (Signed)
Pt still not able to provide a urine sample

## 2021-12-18 NOTE — ED Provider Notes (Signed)
[facility] Emergency Department Provider Note    Event Date/Time   First MD Initiated Contact with Patient 12/18/21 [phone]     (approximate)  I have reviewed the triage vital signs and the nursing notes.   HISTORY  Chief Complaint Flank Pain (/)    HPI Arcola L Nieuwenhuis is a 55 y.o. female with below listed past medical history presents to the ER for evaluation of right flank pain going right lower quadrant past few days.  Denies any heavy lifting or trauma.  States she does have a history of chronic back pain but typically goes the back right thigh and this was more anterior.  Denies any dysuria or hematuria.  States the pain is mild to moderate.  Has tried some over-the-counter medications without much improvement.  Denies any numbness or tingling.  Past Medical History:  Diagnosis Date   Abnormal Pap smear of cervix    Anxiety    Arthritis    Bipolar 1 disorder (HCC)    COPD (chronic obstructive pulmonary disease) (HCC)    Depression    Dyspnea    with exertion    Dysrhythmia    History of kidney stones    Hyperlipidemia    Hypertension    Oxygen dependent    patient uses 2L oz PRN ; reports hardly ever uses it unless very SOB     [facility] spotted fever 2018   Sarcoidosis of lung ([place])    managed by Dr Lillette Boxer    Seizures [facility]) last seizure 05/2013   Sleep apnea    uses CPAP nightly    UTI (urinary tract infection) 11/13/2018   see ED visit in epic ; was dc'd with oral abx and reports completed and relief of sx    Family History  Problem Relation Age of Onset   Hypertension Mother    Arthritis Mother    Heart disease Mother    Heart failure Mother    Hypertension Father    Arthritis Father    Prostate cancer Father    Heart disease Father    Heart attack Father    Arrhythmia Father 29       pacemaker implant   Ovarian cancer Maternal Grandmother    Past Surgical History:  Procedure Laterality Date   ABDOMINAL HYSTERECTOMY   1999   BLADDER SURGERY     CARDIAC CATHETERIZATION Bilateral 05/29/2016   Procedure: Right/Left Heart Cath and Coronary Angiography;  Surgeon: Corey Skains, MD;  Location: [facility];  Service: Cardiovascular;  Laterality: Bilateral;   COLONOSCOPY     COLONOSCOPY N/A 07/19/2015   Procedure: COLONOSCOPY;  Surgeon: Robert Bellow, MD;  Location: [facility] ENDOSCOPY;  Service: Endoscopy;  Laterality: N/A;   CYSTOSCOPY WITH STENT PLACEMENT Left 01/05/2020   Procedure: CYSTOSCOPY WITH STENT PLACEMENT;  Surgeon: Abbie Sons, MD;  Location: [facility] ORS;  Service: Urology;  Laterality: Left;   CYSTOSCOPY/URETEROSCOPY/HOLMIUM LASER/STENT PLACEMENT Left 01/26/2020   Procedure: CYSTOSCOPY/URETEROSCOPY/HOLMIUM LASER/STENT Exchange;  Surgeon: Abbie Sons, MD;  Location: [facility] ORS;  Service: Urology;  Laterality: Left;   ESOPHAGOGASTRODUODENOSCOPY (EGD) WITH PROPOFOL N/A 03/01/2021   Procedure: ESOPHAGOGASTRODUODENOSCOPY (EGD) WITH PROPOFOL;  Surgeon: Lin Landsman, MD;  Location: [facility] ENDOSCOPY;  Service: Gastroenterology;  Laterality: N/A;   ESOPHAGOGASTRODUODENOSCOPY (EGD) WITH PROPOFOL N/A 07/03/2021   Procedure: ESOPHAGOGASTRODUODENOSCOPY (EGD) WITH PROPOFOL;  Surgeon: Jonathon Bellows, MD;  Location: [facility] ENDOSCOPY;  Service: Gastroenterology;  Laterality: N/A;   FLEXIBLE BRONCHOSCOPY N/A 07/27/2016   Procedure:  FLEXIBLE BRONCHOSCOPY;  Surgeon: Yevonne Pax, MD;  Location: [facility] ORS;  Service: Pulmonary;  Laterality: N/A;   FOOT SURGERY     JOINT REPLACEMENT Left    hip   KIDNEY STONE SURGERY  7 years   TOTAL HIP ARTHROPLASTY Left 12/02/2018   Procedure: TOTAL HIP ARTHROPLASTY ANTERIOR APPROACH;  Surgeon: Sheral Apley, MD;  Location: [facility] ORS;  Service: Orthopedics;  Laterality: Left;   TUBAL LIGATION     Patient Active Problem List   Diagnosis Date Noted   Kidney stone 01/05/2020   Atopic dermatitis 09/08/2019   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/08/2019   Acute cystitis with  hematuria 09/08/2019   Other fatigue 07/19/2019   Hypoglycemia 07/19/2019   Arm weakness 03/07/2019   Influenza A 02/24/2019   Acute upper respiratory infection 02/24/2019   Chronic obstructive pulmonary disease (HCC) 02/24/2019   Acute otitis externa of both ears 01/06/2019   Mild intermittent asthma without complication 01/06/2019   Primary osteoarthritis of hip 12/02/2018   Primary osteoarthritis of left hip 10/13/2018   OSA (obstructive sleep apnea) 10/13/2018   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   [place] spotted fever 07/02/2018   Urinary tract infection without hematuria 07/02/2018   Dysuria 06/23/2018   Depression, major, recurrent, moderate (HCC) 03/26/2018   Generalized edema 03/26/2018   Anaphylactic syndrome 03/26/2018   Cutaneous candidiasis 03/26/2018   Bursitis of hip 02/03/2018   Osteoarthritis of knee 02/03/2018   Chronic pulmonary hypertension (HCC) 06/18/2016   S/P cardiac cath 06/18/2016   Unstable angina (HCC) 05/24/2016   Bilateral leg edema 05/22/2016   Shortness of breath 04/30/2016   Skin lesion of right arm 03/01/2016   Screening for breast cancer 06/10/2015   Skin lesion of breast 06/10/2015   Hx of seizure disorder 06/24/2014   Left-sided weakness 06/24/2014   Tobacco abuse 06/24/2014      Prior to Admission medications   Medication Sig Start Date End Date Taking? Authorizing Provider  oxyCODONE-acetaminophen (PERCOCET) 5-325 MG tablet Take 1 tablet by mouth every 4 (four) hours as needed for severe pain. 12/18/21 12/18/22 Yes Willy Eddy, MD  albuterol (VENTOLIN HFA) 108 (90 Base) MCG/ACT inhaler Inhale 2 puffs into the lungs every 4 (four) hours as needed for wheezing or shortness of breath. 04/17/21   Irean Hong, MD  Alum & Mag Hydroxide-Simeth (GI COCKTAIL) SUSP suspension Shake well. Each dose to containe 37mL maalox and 64mL viscous lidocaine 01/11/21   Minna Antis, MD  amLODipine (NORVASC) 10 MG tablet Take 10 mg by mouth  daily. 10/10/20   [provider]  aspirin EC 81 MG tablet Take 1 tablet (81 mg total) by mouth 2 (two) times daily. For DVT prophylaxis for 30 days after surgery. Patient taking differently: Take 81 mg by mouth daily. 12/02/18   Martensen, Lucretia Kern III, PA-C  azithromycin (ZITHROMAX Z-PAK) 250 MG tablet Take 2 tablets (500 mg) on  Day 1,  followed by 1 tablet (250 mg) once daily on Days 2 through 5. 09/21/21   Cuthriell, Delorise Royals, PA-C  budesonide-formoterol (SYMBICORT) 160-4.5 MCG/ACT inhaler Inhale 2 puffs into the lungs 2 (two) times daily. 08/18/20   Coralyn Helling, MD  EPINEPHrine 0.3 mg/0.3 mL IJ SOAJ injection Inject 0.3 mg into the muscle as needed for anaphylaxis.    [provider]  fluticasone (FLONASE) 50 MCG/ACT nasal spray Place 2 sprays into both nostrils daily. Patient taking differently: Place 2 sprays into both nostrils daily as needed for allergies. 02/16/19   Greig Right  W, PA-C  furosemide (LASIX) 40 MG tablet Take 1 tablet (40 mg total) by mouth daily as needed for fluid. Patient taking differently: Take 40 mg by mouth daily as needed for fluid or edema. 01/06/19   Ronnell Freshwater, NP  hydrochlorothiazide (MICROZIDE) 12.5 MG capsule Take 12.5 mg by mouth daily. 09/02/20   [provider]  meloxicam (MOBIC) 15 MG tablet Take 1 tablet (15 mg total) by mouth daily. 09/21/21   Cuthriell, Charline Bills, PA-C  omeprazole (PRILOSEC) 40 MG capsule TAKE (1) CAPSULE TWICE DAILY BEFORE A MEAL 09/05/21   Vanga, Tally Due, MD  ondansetron (ZOFRAN) 4 MG tablet Take 4 mg by mouth every 8 (eight) hours as needed. 04/19/21   [provider]  ondansetron (ZOFRAN-ODT) 4 MG disintegrating tablet Take 1 tablet (4 mg total) by mouth every 8 (eight) hours as needed for nausea or vomiting. 09/21/21   Cuthriell, Charline Bills, PA-C  SPIRIVA HANDIHALER 18 MCG inhalation capsule 1 capsule daily. 09/14/20   [provider]  venlafaxine (EFFEXOR) 75 MG tablet Take by  mouth. 08/03/20   [provider]    Allergies Bee venom, Ciprofloxacin, and Penicillins    Social History Social History   Tobacco Use   Smoking status: Every Day    Packs/day: 0.50    Years: 14.00    Pack years: 7.00    Types: Cigarettes   Smokeless tobacco: Never  Vaping Use   Vaping Use: Never used  Substance Use Topics   Alcohol use: Yes    Alcohol/week: 0.0 standard drinks    Comment: seldom    Drug use: No    Review of Systems Patient denies headaches, rhinorrhea, blurry vision, numbness, shortness of breath, chest pain, edema, cough, abdominal pain, nausea, vomiting, diarrhea, dysuria, fevers, rashes or hallucinations unless otherwise stated above in HPI. ____________________________________________   PHYSICAL EXAM:  VITAL SIGNS: Vitals:   12/18/21 0737  BP: 140/74  Pulse: 78  Resp: 18  Temp: 98.4 F (36.9 C)  SpO2: 94%    Constitutional: Alert and oriented.  Eyes: Conjunctivae are normal.  Head: Atraumatic. Nose: No congestion/rhinnorhea. Mouth/Throat: Mucous membranes are moist.   Neck: No stridor. Painless ROM.  Cardiovascular: Normal rate, regular rhythm. Grossly normal heart sounds.  Good peripheral circulation. Respiratory: Normal respiratory effort.  No retractions. Lungs CTAB. Gastrointestinal: Soft and nontender. No distention. No abdominal bruits. No CVA tenderness. Genitourinary:  Musculoskeletal: No lower extremity tenderness nor edema.  No joint effusions. Neurologic:  Normal speech and language. No gross focal neurologic deficits are appreciated. No facial droop Skin:  Skin is warm, dry and intact. No rash noted. Psychiatric: Mood and affect are normal. Speech and behavior are normal.  ____________________________________________   LABS (all labs ordered are listed, but only abnormal results are displayed)  Results for orders placed or performed during the hospital encounter of 12/18/21 (from the past 24 hour(s))  CBC      Status: None   Collection Time: 12/18/21  8:40 AM  Result Value Ref Range   WBC 5.1 4.0 - 10.5 K/uL   RBC 4.20 3.87 - 5.11 MIL/uL   Hemoglobin 13.6 12.0 - 15.0 g/dL   HCT 40.9 36.0 - 46.0 %   MCV 97.4 80.0 - 100.0 fL   MCH 32.4 26.0 - 34.0 pg   MCHC 33.3 30.0 - 36.0 g/dL   RDW 13.4 11.5 - 15.5 %   Platelets 234 150 - 400 K/uL   nRBC 0.0 0.0 - 0.2 %  Comprehensive  metabolic panel     Status: Abnormal   Collection Time: 12/18/21  8:40 AM  Result Value Ref Range   Sodium 140 135 - 145 mmol/L   Potassium 3.0 (L) 3.5 - 5.1 mmol/L   Chloride 103 98 - 111 mmol/L   CO2 31 22 - 32 mmol/L   Glucose, Bld 92 70 - 99 mg/dL   BUN 11 6 - 20 mg/dL   Creatinine, Ser 0.39 (L) 0.44 - 1.00 mg/dL   Calcium 9.0 8.9 - 10.3 mg/dL   Total Protein 8.4 (H) 6.5 - 8.1 g/dL   Albumin 4.1 3.5 - 5.0 g/dL   AST 14 (L) 15 - 41 U/L   ALT 12 0 - 44 U/L   Alkaline Phosphatase 74 38 - 126 U/L   Total Bilirubin 0.6 0.3 - 1.2 mg/dL   GFR, Estimated >60 >60 mL/min   Anion gap 6 5 - 15  Urinalysis, Complete w Microscopic     Status: Abnormal   Collection Time: 12/18/21 11:26 AM  Result Value Ref Range   Color, Urine YELLOW YELLOW   APPearance CLEAR CLEAR   Specific Gravity, Urine 1.010 1.005 - 1.030   pH 7.0 5.0 - 8.0   Glucose, UA NEGATIVE NEGATIVE mg/dL   Hgb urine dipstick MODERATE (A) NEGATIVE   Bilirubin Urine NEGATIVE NEGATIVE   Ketones, ur 15 (A) NEGATIVE mg/dL   Protein, ur 100 (A) NEGATIVE mg/dL   Nitrite NEGATIVE NEGATIVE   Leukocytes,Ua TRACE (A) NEGATIVE   Squamous Epithelial / LPF 11-20 0 - 5   Non Squamous Epithelial PRESENT (A) NONE SEEN   WBC, UA 6-10 0 - 5 WBC/hpf   RBC / HPF 0-5 0 - 5 RBC/hpf   Bacteria, UA FEW (A) NONE SEEN   ____________________________________________ ___________________________  RADIOLOGY  I personally reviewed all radiographic images ordered to evaluate for the above acute complaints and reviewed radiology reports and findings.  These findings were personally  discussed with the patient.  Please see medical record for radiology report.  ____________________________________________   PROCEDURES  Procedure(s) performed:  Procedures    Critical Care performed: no ____________________________________________   INITIAL IMPRESSION / ASSESSMENT AND PLAN / ED COURSE  Pertinent labs & imaging results that were available during my care of the patient were reviewed by me and considered in my medical decision making (see chart for details).   DDX: msk strain, stone, pyelo, appy, colitis, sciatica  Miangel L Maillard is a 55 y.o. who presents to the ED with symptoms as described above.  Patient nontoxic-appearing blood work as well as CT imaging was ordered for above differential shows no acute abnormality.  Possible musculoskeletal strain she denies any dysuria.  No sign of UTI or stone.  Not consistent with appendicitis or colitis.  Patient's pain improved in the ER.  I will give symptomatic management discussed strict return precautions.  Patient agreeable to plan.     The patient was evaluated in Emergency Department today for the symptoms described in the history of present illness. He/she was evaluated in the context of the global COVID-19 pandemic, which necessitated consideration that the patient might be at risk for infection with the SARS-CoV-2 virus that causes COVID-19. Institutional protocols and algorithms that pertain to the evaluation of patients at risk for COVID-19 are in a state of rapid change based on information released by regulatory bodies including the CDC and federal and state organizations. These policies and algorithms were followed during the patient's care in the ED.  As  part of my medical decision making, I reviewed the following data within the [place] notes reviewed and incorporated, Labs reviewed, notes from prior ED visits and [place] Controlled Substance  Database   ____________________________________________   FINAL CLINICAL IMPRESSION(S) / ED DIAGNOSES  Final diagnoses:  Right flank pain      NEW MEDICATIONS STARTED DURING THIS VISIT:  New Prescriptions   OXYCODONE-ACETAMINOPHEN (PERCOCET) 5-325 MG TABLET    Take 1 tablet by mouth every 4 (four) hours as needed for severe pain.     Note:  This document was prepared using Dragon voice recognition software and may include unintentional dictation errors.    Merlyn Lot, MD 12/18/21 [phone]

## 2021-12-18 NOTE — ED Triage Notes (Signed)
Presents with muscle spasms  states pain starts at right flank area and moves to right lower abd and into leg  states pain started several days ago

## 2021-12-22 ENCOUNTER — Other Ambulatory Visit: Payer: Self-pay | Admitting: Gastroenterology

## 2021-12-22 ENCOUNTER — Emergency Department
Admission: EM | Admit: 2021-12-22 | Discharge: 2021-12-22 | Disposition: A | Discharge status: Home / Self Care | Payer: Medicare Other | Attending: Emergency Medicine | Admitting: Emergency Medicine

## 2021-12-22 ENCOUNTER — Encounter: Payer: Self-pay | Admitting: Intensive Care

## 2021-12-22 ENCOUNTER — Emergency Department: Payer: Medicare Other

## 2021-12-22 ENCOUNTER — Other Ambulatory Visit: Payer: Self-pay

## 2021-12-22 DIAGNOSIS — M545 Low back pain, unspecified: Secondary | ICD-10-CM | POA: Diagnosis present

## 2021-12-22 DIAGNOSIS — Z96642 Presence of left artificial hip joint: Secondary | ICD-10-CM | POA: Diagnosis not present

## 2021-12-22 DIAGNOSIS — Z7951 Long term (current) use of inhaled steroids: Secondary | ICD-10-CM | POA: Diagnosis not present

## 2021-12-22 DIAGNOSIS — R1011 Right upper quadrant pain: Secondary | ICD-10-CM | POA: Insufficient documentation

## 2021-12-22 DIAGNOSIS — M5431 Sciatica, right side: Secondary | ICD-10-CM | POA: Insufficient documentation

## 2021-12-22 DIAGNOSIS — R319 Hematuria, unspecified: Secondary | ICD-10-CM | POA: Diagnosis not present

## 2021-12-22 DIAGNOSIS — I10 Essential (primary) hypertension: Secondary | ICD-10-CM | POA: Diagnosis not present

## 2021-12-22 DIAGNOSIS — F17210 Nicotine dependence, cigarettes, uncomplicated: Secondary | ICD-10-CM | POA: Diagnosis not present

## 2021-12-22 DIAGNOSIS — Z79899 Other long term (current) drug therapy: Secondary | ICD-10-CM | POA: Diagnosis not present

## 2021-12-22 DIAGNOSIS — J449 Chronic obstructive pulmonary disease, unspecified: Secondary | ICD-10-CM | POA: Insufficient documentation

## 2021-12-22 DIAGNOSIS — Z7982 Long term (current) use of aspirin: Secondary | ICD-10-CM | POA: Insufficient documentation

## 2021-12-22 LAB — URINALYSIS, ROUTINE W REFLEX MICROSCOPIC
Bacteria, UA: NONE SEEN
Bilirubin Urine: NEGATIVE
Glucose, UA: NEGATIVE mg/dL
Ketones, ur: NEGATIVE mg/dL
Nitrite: NEGATIVE
Protein, ur: 30 mg/dL — AB
Specific Gravity, Urine: 1.021 (ref 1.005–1.030)
pH: 5 (ref 5.0–8.0)

## 2021-12-22 LAB — CBC
HCT: 43 % (ref 36.0–46.0)
Hemoglobin: 14.5 g/dL (ref 12.0–15.0)
MCH: 33 pg (ref 26.0–34.0)
MCHC: 33.7 g/dL (ref 30.0–36.0)
MCV: 97.7 fL (ref 80.0–100.0)
Platelets: 332 10*3/uL (ref 150–400)
RBC: 4.4 MIL/uL (ref 3.87–5.11)
RDW: 13.1 % (ref 11.5–15.5)
WBC: 3.7 10*3/uL — ABNORMAL LOW (ref 4.0–10.5)
nRBC: 0 % (ref 0.0–0.2)

## 2021-12-22 LAB — COMPREHENSIVE METABOLIC PANEL
ALT: 15 U/L (ref 0–44)
AST: 18 U/L (ref 15–41)
Albumin: 4.2 g/dL (ref 3.5–5.0)
Alkaline Phosphatase: 75 U/L (ref 38–126)
Anion gap: 10 (ref 5–15)
BUN: 17 mg/dL (ref 6–20)
CO2: 31 mmol/L (ref 22–32)
Calcium: 9.3 mg/dL (ref 8.9–10.3)
Chloride: 99 mmol/L (ref 98–111)
Creatinine, Ser: 0.62 mg/dL (ref 0.44–1.00)
GFR, Estimated: >60 mL/min (ref >60)
Glucose, Bld: 105 mg/dL — ABNORMAL HIGH (ref 70–99)
Potassium: 3.2 mmol/L — ABNORMAL LOW (ref 3.5–5.1)
Sodium: 140 mmol/L (ref 135–145)
Total Bilirubin: 0.6 mg/dL (ref 0.3–1.2)
Total Protein: 8.6 g/dL — ABNORMAL HIGH (ref 6.5–8.1)

## 2021-12-22 LAB — LIPASE, BLOOD: Lipase: 31 U/L (ref 11–51)

## 2021-12-22 MED ORDER — KETOROLAC TROMETHAMINE 15 MG/ML IJ SOLN
15.0000 mg | Freq: Once | INTRAMUSCULAR | Status: AC
  Administered 2021-12-22: 13:00:00 15 mg via INTRAMUSCULAR
  Filled 2021-12-22: qty 1

## 2021-12-22 MED ORDER — NAPROXEN 250 MG PO TABS: 250.0000 mg | ORAL_TABLET | Freq: Two times a day (BID) | ORAL | 0 refills | Status: DC

## 2021-12-22 MED ORDER — NAPROXEN 250 MG PO TABS: 250.0000 mg | ORAL_TABLET | Freq: Two times a day (BID) | ORAL | 0 refills | Status: AC

## 2021-12-22 NOTE — ED Provider Notes (Signed)
[facility]  ____________________________________________   Event Date/Time   First MD Initiated Contact with Patient 12/22/21 1207     (approximate)  I have reviewed the triage vital signs and the nursing notes.   HISTORY  Chief Complaint Urinary Retention and Abdominal Pain    HPI Nicole Wyatt is a 55 y.o. female with past medical history of bipolar disorder, COPD, history of kidney stones who presents with abdominal pain and back pain.  Patient was seen several days ago in the ED for similar.  Pain is located in the right lower back rating down to the right buttock and mid leg.  Described as a spasm like feeling and that is coming and going.  She denies weakness but does have occasional numbness in the leg.  Patient also has abdominal cramping in the right upper quadrant.  Pain is relatively constant, not associated with eating.  She denies nausea vomiting diarrhea or constipation.  During her last ED visit she had a CT abdomen pelvis with contrast which was negative, UA not consistent with infection was ultimately discharged with Percocet.  She followed up with her primary care provider yesterday who told her to come back to the ED for evaluation of her pain and urinary retention.  Patient notes that she urinated yesterday around 3 PM and then did not have the urge to go until this morning despite drinking significant mount a water.  She denies urinary or fecal incontinence or feeling like she needs to go just simply did not have the urge.  She was able to urinate and feels like she emptied her bladder here in the ED.  Denies fevers or chills.         Past Medical History:  Diagnosis Date   Abnormal Pap smear of cervix    Anxiety    Arthritis    Bipolar 1 disorder ([place])    COPD (chronic obstructive pulmonary disease) (HCC)    Depression    Dyspnea    with exertion    Dysrhythmia    History of kidney stones    Hyperlipidemia    Hypertension     Oxygen dependent    patient uses 2L oz PRN ; reports hardly ever uses it unless very SOB     [facility] spotted fever 2018   Sarcoidosis of lung ([place])    managed by Dr Lillette Boxer    Seizures [facility]) last seizure 05/2013   Sleep apnea    uses CPAP nightly    UTI (urinary tract infection) 11/13/2018   see ED visit in epic ; was dc'd with oral abx and reports completed and relief of sx     Patient Active Problem List   Diagnosis Date Noted   Kidney stone 01/05/2020   Atopic dermatitis 09/08/2019   Inflammatory polyarthritis ([place]) 09/08/2019   Acute cystitis with hematuria 09/08/2019   Other fatigue 07/19/2019   Hypoglycemia 07/19/2019   Arm weakness 03/07/2019   Influenza A 02/24/2019   Acute upper respiratory infection 02/24/2019   Chronic obstructive pulmonary disease ([place]) 02/24/2019   Acute otitis externa of both ears 01/06/2019   Mild intermittent asthma without complication [id number]   Primary osteoarthritis of hip 12/02/2018   Primary osteoarthritis of left hip 10/13/2018   OSA (obstructive sleep apnea) 10/13/2018   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   [place] spotted fever 07/02/2018   Urinary tract infection without hematuria 07/02/2018   Dysuria 06/23/2018   Depression, major, recurrent, moderate ([place]) 03/26/2018  Generalized edema 03/26/2018   Anaphylactic syndrome 03/26/2018   Cutaneous candidiasis 03/26/2018   Bursitis of hip 02/03/2018   Osteoarthritis of knee 02/03/2018   Chronic pulmonary hypertension (HCC) 06/18/2016   S/P cardiac cath 06/18/2016   Unstable angina (HCC) 05/24/2016   Bilateral leg edema 05/22/2016   Shortness of breath 04/30/2016   Skin lesion of right arm 03/01/2016   Screening for breast cancer 06/10/2015   Skin lesion of breast 06/10/2015   Hx of seizure disorder 06/24/2014   Left-sided weakness 06/24/2014   Tobacco abuse 06/24/2014    Past Surgical History:  Procedure Laterality Date   ABDOMINAL HYSTERECTOMY  1999   BLADDER  SURGERY     CARDIAC CATHETERIZATION Bilateral 05/29/2016   Procedure: Right/Left Heart Cath and Coronary Angiography;  Surgeon: Lamar BlinksBruce J Kowalski, MD;  Location: [facility] INVASIVE CV LAB;  Service: Cardiovascular;  Laterality: Bilateral;   COLONOSCOPY     COLONOSCOPY N/A 07/19/2015   Procedure: COLONOSCOPY;  Surgeon: Earline MayotteJeffrey W Byrnett, MD;  [facility];  Service: Endoscopy;  Laterality: N/A;   CYSTOSCOPY WITH STENT PLACEMENT Left 01/05/2020   Procedure: CYSTOSCOPY WITH STENT PLACEMENT;  Surgeon: Riki AltesStoioff, Scott C, MD;  Location: [facility] ORS;  Service: Urology;  Laterality: Left;   CYSTOSCOPY/URETEROSCOPY/HOLMIUM LASER/STENT PLACEMENT Left 01/26/2020   Procedure: CYSTOSCOPY/URETEROSCOPY/HOLMIUM LASER/STENT Exchange;  Surgeon: Riki AltesStoioff, Scott C, MD;  Location: [facility] ORS;  Service: Urology;  Laterality: Left;   ESOPHAGOGASTRODUODENOSCOPY (EGD) WITH PROPOFOL N/A 03/01/2021   Procedure: ESOPHAGOGASTRODUODENOSCOPY (EGD) WITH PROPOFOL;  Surgeon: Toney ReilVanga, Rohini Reddy, MD;  [facility];  Service: Gastroenterology;  Laterality: N/A;   ESOPHAGOGASTRODUODENOSCOPY (EGD) WITH PROPOFOL N/A 07/03/2021   Procedure: ESOPHAGOGASTRODUODENOSCOPY (EGD) WITH PROPOFOL;  Surgeon: Wyline MoodAnna, Kiran, MD;  [facility];  Service: Gastroenterology;  Laterality: N/A;   FLEXIBLE BRONCHOSCOPY N/A 07/27/2016   Procedure: FLEXIBLE BRONCHOSCOPY;  Surgeon: Yevonne PaxSaadat A Khan, MD;  Location: [facility] ORS;  Service: Pulmonary;  Laterality: N/A;   FOOT SURGERY     JOINT REPLACEMENT Left    hip   KIDNEY STONE SURGERY  7 years   TOTAL HIP ARTHROPLASTY Left 12/02/2018   Procedure: TOTAL HIP ARTHROPLASTY ANTERIOR APPROACH;  Surgeon: Sheral ApleyMurphy, Timothy D, MD;  Location: [facility] ORS;  Service: Orthopedics;  Laterality: Left;   TUBAL LIGATION      Prior to Admission medications   Medication Sig Start Date End Date Taking? Authorizing Provider  naproxen (NAPROSYN) 250 MG tablet Take 1 tablet (250 mg total) by mouth 2 (two) times daily with a  meal. 12/22/21 01/21/22 Yes Georga HackingMcHugh, Melany Wiesman Rose, MD  albuterol (VENTOLIN HFA) 108 (90 Base) MCG/ACT inhaler Inhale 2 puffs into the lungs every 4 (four) hours as needed for wheezing or shortness of breath. 04/17/21   Irean HongSung, Jade J, MD  Alum & Mag Hydroxide-Simeth (GI COCKTAIL) SUSP suspension Shake well. Each dose to containe 15mL maalox and 15mL viscous lidocaine 01/11/21   Minna AntisPaduchowski, Kevin, MD  amLODipine (NORVASC) 10 MG tablet Take 10 mg by mouth daily. 10/10/20   [provider]  aspirin EC 81 MG tablet Take 1 tablet (81 mg total) by mouth 2 (two) times daily. For DVT prophylaxis for 30 days after surgery. Patient taking differently: Take 81 mg by mouth daily. 12/02/18   Martensen, Lucretia KernHenry Calvin III, PA-C  azithromycin (ZITHROMAX Z-PAK) 250 MG tablet Take 2 tablets (500 mg) on  Day 1,  followed by 1 tablet (250 mg) once daily on Days 2 through 5. 09/21/21   Cuthriell, Delorise RoyalsJonathan D, PA-C  budesonide-formoterol (SYMBICORT) 160-4.5 MCG/ACT  inhaler Inhale 2 puffs into the lungs 2 (two) times daily. 08/18/20   Coralyn Helling, MD  EPINEPHrine 0.3 mg/0.3 mL IJ SOAJ injection Inject 0.3 mg into the muscle as needed for anaphylaxis.    [provider]  fluticasone (FLONASE) 50 MCG/ACT nasal spray Place 2 sprays into both nostrils daily. Patient taking differently: Place 2 sprays into both nostrils daily as needed for allergies. 02/16/19   Fisher, Roselyn Bering, PA-C  furosemide (LASIX) 40 MG tablet Take 1 tablet (40 mg total) by mouth daily as needed for fluid. Patient taking differently: Take 40 mg by mouth daily as needed for fluid or edema. 01/06/19   Carlean Jews, NP  hydrochlorothiazide (MICROZIDE) 12.5 MG capsule Take 12.5 mg by mouth daily. 09/02/20   [provider]  meloxicam (MOBIC) 15 MG tablet Take 1 tablet (15 mg total) by mouth daily. 09/21/21   Cuthriell, Delorise Royals, PA-C  omeprazole (PRILOSEC) 40 MG capsule TAKE (1) CAPSULE TWICE DAILY BEFORE A MEAL 09/05/21   Vanga, Loel Dubonnet, MD  ondansetron (ZOFRAN) 4 MG tablet Take 4 mg by mouth every 8 (eight) hours as needed. 04/19/21   [provider]  ondansetron (ZOFRAN-ODT) 4 MG disintegrating tablet Take 1 tablet (4 mg total) by mouth every 8 (eight) hours as needed for nausea or vomiting. 09/21/21   Cuthriell, Delorise Royals, PA-C  oxyCODONE-acetaminophen (PERCOCET) 5-325 MG tablet Take 1 tablet by mouth every 4 (four) hours as needed for severe pain. 12/18/21 12/18/22  Willy Eddy, MD  SPIRIVA HANDIHALER 18 MCG inhalation capsule 1 capsule daily. 09/14/20   [provider]  venlafaxine (EFFEXOR) 75 MG tablet Take by mouth. 08/03/20   [provider]    Allergies Bee venom, Percocet [oxycodone-acetaminophen], Ciprofloxacin, and Penicillins  Family History  Problem Relation Age of Onset   Hypertension Mother    Arthritis Mother    Heart disease Mother    Heart failure Mother    Hypertension Father    Arthritis Father    Prostate cancer Father    Heart disease Father    Heart attack Father    Arrhythmia Father 81       pacemaker implant   Ovarian cancer Maternal Grandmother     Social History Social History   Tobacco Use   Smoking status: Every Day    Packs/day: 0.50    Years: 14.00    Pack years: 7.00    Types: Cigarettes   Smokeless tobacco: Never  Vaping Use   Vaping Use: Never used  Substance Use Topics   Alcohol use: Not Currently    Alcohol/week: 2.0 standard drinks    Types: 2 Glasses of wine per week   Drug use: No    Review of Systems   Review of Systems  Constitutional:  Negative for chills and fever.  Respiratory:  Negative for shortness of breath.   Gastrointestinal:  Positive for abdominal pain. Negative for constipation, diarrhea, nausea and vomiting.  Genitourinary:  Positive for decreased urine volume and flank pain. Negative for difficulty urinating, dysuria and hematuria.  Musculoskeletal:  Positive for back pain.  Neurological:  Positive for  numbness. Negative for weakness.  All other systems reviewed and are negative.  Physical Exam Updated Vital Signs BP 130/70 (BP Location: Left Arm)    Pulse 80    Temp 98.9 F (37.2 C) (Oral)    Resp 16    Ht 5\' 7"  (1.702 m)    Wt 95.7 kg    SpO2 98%  BMI 33.05 kg/m   Physical Exam Vitals and nursing note reviewed.  Constitutional:      General: She is not in acute distress.    Appearance: Normal appearance.  HENT:     Head: Normocephalic and atraumatic.  Eyes:     General: No scleral icterus.    Conjunctiva/sclera: Conjunctivae normal.  Pulmonary:     Effort: Pulmonary effort is normal. No respiratory distress.     Breath sounds: No stridor.  Abdominal:     General: Abdomen is flat.     Palpations: Abdomen is soft.     Comments: Tenderness palpation the right upper quadrant with no guarding, abdomen soft  Musculoskeletal:        General: No deformity or signs of injury.     Cervical back: Normal range of motion.  Skin:    General: Skin is dry.     Coloration: Skin is not jaundiced or pale.  Neurological:     General: No focal deficit present.     Mental Status: She is alert and oriented to person, place, and time. Mental status is at baseline.     Comments: Tenderness to palpation of the right SI joint, no midline lumbar tenderness 5/5 strength with hip flexion, plantar flexion dorsiflexion  Psychiatric:        Mood and Affect: Mood normal.        Behavior: Behavior normal.     LABS (all labs ordered are listed, but only abnormal results are displayed)  Labs Reviewed  COMPREHENSIVE METABOLIC PANEL - Abnormal; Notable for the following components:      Result Value   Potassium 3.2 (*)    Glucose, Bld 105 (*)    Total Protein 8.6 (*)    All other components within normal limits  CBC - Abnormal; Notable for the following components:   WBC 3.7 (*)    All other components within normal limits  URINALYSIS, ROUTINE W REFLEX MICROSCOPIC - Abnormal; Notable for the  following components:   Color, Urine YELLOW (*)    APPearance HAZY (*)    Hgb urine dipstick SMALL (*)    Protein, ur 30 (*)    Leukocytes,Ua MODERATE (*)    All other components within normal limits  LIPASE, BLOOD   ____________________________________________  EKG   ____________________________________________  RADIOLOGY I, Madelin Headings, personally viewed and evaluated these images (plain radiographs) as part of my medical decision making, as well as reviewing the written report by the radiologist.  ED MD interpretation:      ____________________________________________   PROCEDURES  Procedure(s) performed (including Critical Care):  Procedures   ____________________________________________   INITIAL IMPRESSION / ASSESSMENT AND PLAN / ED COURSE     Patient is 55 year old female presenting with right-sided back pain and right upper quadrant pain times several days.  Has already been evaluated for this and had a CT abdomen pelvis with was negative, at this time is primarily having right lower quadrant pain.  She describes what sounds to me like sciatica, with pain radiating from the right lower back down to the buttock region occasional numbness.  Also complaining of right upper quadrant pain and on exam she does have tenderness in the right upper quadrant.  Will obtain a right upper quadrant ultrasound to rule out cholecystitis however my suspicion is low given her overall clinical appearance and benign labs.  UA today does have microscopic hematuria but does not suggest UTI.  Low suspicion for kidney stone given the symptoms of  been going on for a while and her recent CT did not show any obvious stone.  Will refer to urology for evaluation of hematuria.  We will treat her sciatica with NSAIDs.  Patient previously on meloxicam which has stopped working for her so we will switch her to naproxen.  Patient's right upper quadrant ultrasound is normal.  Unclear what the  etiology of her abdominal pain is but with recent negative CT with contrast and the negative ultrasound do not feel that further work-up is necessary at this time.      ____________________________________________   FINAL CLINICAL IMPRESSION(S) / ED DIAGNOSES  Final diagnoses:  RUQ pain  Sciatica of right side     ED Discharge Orders          Ordered    naproxen (NAPROSYN) 250 MG tablet  2 times daily with meals        12/22/21 1351             Note:  This document was prepared using Dragon voice recognition software and may include unintentional dictation errors.    Rada Hay, MD 12/22/21 1351

## 2021-12-22 NOTE — ED Notes (Signed)
Waiting on toradol from pharmacy

## 2021-12-22 NOTE — Discharge Instructions (Addendum)
The ultrasound of your gallbladder and liver was normal.  Your blood work was also reassuring and your urine sample did not suggest that you have a UTI.  You likely have sciatica which is causing your back and buttock pain.  You should start taking naproxen twice daily for the pain.  Take the meloxicam and naproxen together.  You did have a small amount of blood in your urine which she should follow-up with urology for.  Otherwise please follow-up with your primary care provider for your ongoing symptoms.

## 2021-12-22 NOTE — ED Notes (Signed)
See triage note  presents with right flank and abd pain  also having pain into right leg  describes pain as spasm like    no fever  states she is having some diff passing water

## 2021-12-22 NOTE — ED Triage Notes (Signed)
Patient c/o urinary retention since 3pm 12/21/21. Patients PCP sent patient here concerned about patient being unable to void and possible liver issues. C/o pelvic discomfort and right sided abdominal pain

## 2022-02-27 ENCOUNTER — Other Ambulatory Visit: Payer: Self-pay | Admitting: Gastroenterology

## 2022-04-05 ENCOUNTER — Other Ambulatory Visit: Payer: Self-pay | Admitting: Gastroenterology

## 2022-04-24 ENCOUNTER — Telehealth: Payer: Self-pay | Admitting: Cardiology

## 2022-04-24 NOTE — Telephone Encounter (Signed)
Noted  

## 2022-04-24 NOTE — Telephone Encounter (Signed)
Have made several attempts to schedule overdue follow up ?Recall deleted  ?

## 2022-04-25 ENCOUNTER — Telehealth: Payer: Medicare Other | Admitting: Physician Assistant

## 2022-04-25 DIAGNOSIS — K2210 Ulcer of esophagus without bleeding: Secondary | ICD-10-CM

## 2022-04-25 NOTE — Progress Notes (Signed)
Based on what you shared with me, I feel your condition warrants further evaluation and I recommend that you be seen in a face to face visit. ? ?Giving history of erosive esophagitis and ongoing symptoms with mention of chest pain and some shortness of breath on occasion with this, you will need an in-person evaluation. I would recommend initial evaluation with PCP, your previous Gastroentertologist or Urgent Care unless symptoms are currently severe in which case ER assessment is warranted.  ?  ?NOTE: There will be NO CHARGE for this eVisit ?  ?If you are having a true medical emergency please call 911.   ?  ? For an urgent face to face visit, [facility] has six urgent care centers for your convenience:  ?  ? [facility] at [facility] ?Get Driving Directions ?[phone] ?[phone] ?[place], [place] [address] ?  ? [facility] ?Get Driving Directions ?[phone] ?[address] ?[place], [place] [address] ? ?[facility]) ?Get Driving Directions ?[phone] ?[address] ?[place],  [place]  [address] ? ?[facility] ?Get Driving Directions ?[phone] ?[address] , [address] ?[place], [place] [address] ?  ?[facility] [place] ?Get Driving Directions  ?[phone] ?[address] ?[address] ?[place], [place] [address] ?  ?[facility] ?Get Driving Directions ?[phone] ?[address] ?[place], [place] [address] ? ?Your MyChart E-visit questionnaire answers were reviewed by a board certified advanced clinical practitioner to complete your personal care plan based on your specific symptoms.  Thank you for using e-Visits. ?  ? ?

## 2022-05-04 ENCOUNTER — Other Ambulatory Visit: Payer: Self-pay | Admitting: Family Medicine

## 2022-05-04 DIAGNOSIS — R319 Hematuria, unspecified: Secondary | ICD-10-CM

## 2022-05-15 ENCOUNTER — Ambulatory Visit: Payer: Medicare Other

## 2022-05-31 ENCOUNTER — Ambulatory Visit: Payer: Medicare Other | Admitting: Cardiology

## 2022-06-01 ENCOUNTER — Encounter: Payer: Self-pay | Admitting: Cardiology

## 2022-06-21 ENCOUNTER — Other Ambulatory Visit: Payer: Self-pay | Admitting: Gastroenterology

## 2022-07-03 ENCOUNTER — Encounter (HOSPITAL_COMMUNITY): Payer: Self-pay | Admitting: Emergency Medicine

## 2022-07-03 ENCOUNTER — Other Ambulatory Visit: Payer: Self-pay

## 2022-07-03 ENCOUNTER — Emergency Department (HOSPITAL_COMMUNITY)
Admission: EM | Admit: 2022-07-03 | Discharge: 2022-07-03 | Disposition: A | Discharge status: Home / Self Care | Payer: Medicare Other | Attending: Student | Admitting: Student

## 2022-07-03 DIAGNOSIS — R21 Rash and other nonspecific skin eruption: Secondary | ICD-10-CM | POA: Insufficient documentation

## 2022-07-03 DIAGNOSIS — F17210 Nicotine dependence, cigarettes, uncomplicated: Secondary | ICD-10-CM | POA: Diagnosis not present

## 2022-07-03 DIAGNOSIS — I10 Essential (primary) hypertension: Secondary | ICD-10-CM | POA: Insufficient documentation

## 2022-07-03 DIAGNOSIS — Z7951 Long term (current) use of inhaled steroids: Secondary | ICD-10-CM | POA: Insufficient documentation

## 2022-07-03 DIAGNOSIS — Z7982 Long term (current) use of aspirin: Secondary | ICD-10-CM | POA: Diagnosis not present

## 2022-07-03 DIAGNOSIS — Z87442 Personal history of urinary calculi: Secondary | ICD-10-CM | POA: Insufficient documentation

## 2022-07-03 DIAGNOSIS — J449 Chronic obstructive pulmonary disease, unspecified: Secondary | ICD-10-CM | POA: Insufficient documentation

## 2022-07-03 DIAGNOSIS — R531 Weakness: Secondary | ICD-10-CM | POA: Diagnosis not present

## 2022-07-03 DIAGNOSIS — Z79899 Other long term (current) drug therapy: Secondary | ICD-10-CM | POA: Diagnosis not present

## 2022-07-03 MED ORDER — CLOTRIMAZOLE 1 % EX CREA
TOPICAL_CREAM | Freq: Once | CUTANEOUS | Status: AC
  Filled 2022-07-03: qty 15

## 2022-07-03 MED ORDER — METHYLPREDNISOLONE 4 MG PO TBPK: ORAL_TABLET | ORAL | 0 refills | Status: DC

## 2022-07-03 NOTE — ED Notes (Signed)
Applied topical cream underneath breast

## 2022-07-03 NOTE — ED Triage Notes (Signed)
Pt was at the beach last week when she started to develop a rash under breast and it has started to spread to back, up neck, and on thighs. Pt saw PCP 2 days ago and was given nystatin powder but rash continues to spread. Pt states skin is very irritated denies pain

## 2022-07-04 ENCOUNTER — Emergency Department: Payer: Medicare Other

## 2022-07-04 ENCOUNTER — Other Ambulatory Visit: Payer: Self-pay

## 2022-07-04 ENCOUNTER — Inpatient Hospital Stay
Admission: EM | Admit: 2022-07-04 | Discharge: 2022-07-06 | DRG: 191 | Disposition: A | Discharge status: Home / Self Care | Payer: Medicare Other | Attending: Internal Medicine | Admitting: Internal Medicine

## 2022-07-04 DIAGNOSIS — E785 Hyperlipidemia, unspecified: Secondary | ICD-10-CM | POA: Diagnosis present

## 2022-07-04 DIAGNOSIS — M1990 Unspecified osteoarthritis, unspecified site: Secondary | ICD-10-CM | POA: Diagnosis present

## 2022-07-04 DIAGNOSIS — Z8261 Family history of arthritis: Secondary | ICD-10-CM

## 2022-07-04 DIAGNOSIS — F17210 Nicotine dependence, cigarettes, uncomplicated: Secondary | ICD-10-CM | POA: Diagnosis present

## 2022-07-04 DIAGNOSIS — I452 Bifascicular block: Secondary | ICD-10-CM | POA: Diagnosis present

## 2022-07-04 DIAGNOSIS — F32A Depression, unspecified: Secondary | ICD-10-CM

## 2022-07-04 DIAGNOSIS — E876 Hypokalemia: Secondary | ICD-10-CM | POA: Diagnosis present

## 2022-07-04 DIAGNOSIS — Z96642 Presence of left artificial hip joint: Secondary | ICD-10-CM | POA: Diagnosis present

## 2022-07-04 DIAGNOSIS — Z7951 Long term (current) use of inhaled steroids: Secondary | ICD-10-CM

## 2022-07-04 DIAGNOSIS — E042 Nontoxic multinodular goiter: Secondary | ICD-10-CM | POA: Diagnosis present

## 2022-07-04 DIAGNOSIS — F319 Bipolar disorder, unspecified: Secondary | ICD-10-CM | POA: Diagnosis present

## 2022-07-04 DIAGNOSIS — Z9981 Dependence on supplemental oxygen: Secondary | ICD-10-CM

## 2022-07-04 DIAGNOSIS — Z791 Long term (current) use of non-steroidal anti-inflammatories (NSAID): Secondary | ICD-10-CM

## 2022-07-04 DIAGNOSIS — B372 Candidiasis of skin and nail: Secondary | ICD-10-CM | POA: Diagnosis present

## 2022-07-04 DIAGNOSIS — Z79899 Other long term (current) drug therapy: Secondary | ICD-10-CM

## 2022-07-04 DIAGNOSIS — Z6836 Body mass index (BMI) 36.0-36.9, adult: Secondary | ICD-10-CM

## 2022-07-04 DIAGNOSIS — J441 Chronic obstructive pulmonary disease with (acute) exacerbation: Principal | ICD-10-CM | POA: Diagnosis present

## 2022-07-04 DIAGNOSIS — Z7982 Long term (current) use of aspirin: Secondary | ICD-10-CM

## 2022-07-04 DIAGNOSIS — Z20822 Contact with and (suspected) exposure to covid-19: Secondary | ICD-10-CM | POA: Diagnosis present

## 2022-07-04 DIAGNOSIS — K219 Gastro-esophageal reflux disease without esophagitis: Secondary | ICD-10-CM | POA: Diagnosis present

## 2022-07-04 DIAGNOSIS — Z885 Allergy status to narcotic agent status: Secondary | ICD-10-CM

## 2022-07-04 DIAGNOSIS — I10 Essential (primary) hypertension: Secondary | ICD-10-CM | POA: Diagnosis present

## 2022-07-04 DIAGNOSIS — Z8042 Family history of malignant neoplasm of prostate: Secondary | ICD-10-CM

## 2022-07-04 DIAGNOSIS — Z8041 Family history of malignant neoplasm of ovary: Secondary | ICD-10-CM

## 2022-07-04 DIAGNOSIS — G40909 Epilepsy, unspecified, not intractable, without status epilepticus: Secondary | ICD-10-CM | POA: Diagnosis present

## 2022-07-04 DIAGNOSIS — Z880 Allergy status to penicillin: Secondary | ICD-10-CM

## 2022-07-04 DIAGNOSIS — R531 Weakness: Secondary | ICD-10-CM

## 2022-07-04 DIAGNOSIS — D860 Sarcoidosis of lung: Secondary | ICD-10-CM | POA: Diagnosis present

## 2022-07-04 DIAGNOSIS — Z881 Allergy status to other antibiotic agents status: Secondary | ICD-10-CM

## 2022-07-04 DIAGNOSIS — Z91030 Bee allergy status: Secondary | ICD-10-CM

## 2022-07-04 DIAGNOSIS — G4733 Obstructive sleep apnea (adult) (pediatric): Secondary | ICD-10-CM | POA: Diagnosis present

## 2022-07-04 DIAGNOSIS — Z8249 Family history of ischemic heart disease and other diseases of the circulatory system: Secondary | ICD-10-CM

## 2022-07-04 LAB — COMPREHENSIVE METABOLIC PANEL
ALT: 19 U/L (ref 0–44)
AST: 27 U/L (ref 15–41)
Albumin: 4.5 g/dL (ref 3.5–5.0)
Alkaline Phosphatase: 83 U/L (ref 38–126)
Anion gap: 13 (ref 5–15)
BUN: 12 mg/dL (ref 6–20)
CO2: 25 mmol/L (ref 22–32)
Calcium: 9.5 mg/dL (ref 8.9–10.3)
Chloride: 103 mmol/L (ref 98–111)
Creatinine, Ser: 0.76 mg/dL (ref 0.44–1.00)
GFR, Estimated: >60 mL/min (ref >60)
Glucose, Bld: 97 mg/dL (ref 70–99)
Potassium: 2.9 mmol/L — ABNORMAL LOW (ref 3.5–5.1)
Sodium: 141 mmol/L (ref 135–145)
Total Bilirubin: 0.8 mg/dL (ref 0.3–1.2)
Total Protein: 8.4 g/dL — ABNORMAL HIGH (ref 6.5–8.1)

## 2022-07-04 LAB — TROPONIN I (HIGH SENSITIVITY): Troponin I (High Sensitivity): 4 ng/L (ref <18)

## 2022-07-04 LAB — CBC WITH DIFFERENTIAL/PLATELET
Abs Immature Granulocytes: 0.01 10*3/uL (ref 0.00–0.07)
Basophils Absolute: 0 10*3/uL (ref 0.0–0.1)
Basophils Relative: 0 %
Eosinophils Absolute: 0 10*3/uL (ref 0.0–0.5)
Eosinophils Relative: 1 %
HCT: 41.6 % (ref 36.0–46.0)
Hemoglobin: 14.1 g/dL (ref 12.0–15.0)
Immature Granulocytes: 0 %
Lymphocytes Relative: 32 %
Lymphs Abs: 2 10*3/uL (ref 0.7–4.0)
MCH: 33 pg (ref 26.0–34.0)
MCHC: 33.9 g/dL (ref 30.0–36.0)
MCV: 97.4 fL (ref 80.0–100.0)
Monocytes Absolute: 0.5 10*3/uL (ref 0.1–1.0)
Monocytes Relative: 8 %
Neutro Abs: 3.5 10*3/uL (ref 1.7–7.7)
Neutrophils Relative %: 59 %
Platelets: 272 10*3/uL (ref 150–400)
RBC: 4.27 MIL/uL (ref 3.87–5.11)
RDW: 13.2 % (ref 11.5–15.5)
WBC: 6.1 10*3/uL (ref 4.0–10.5)
nRBC: 0 % (ref 0.0–0.2)

## 2022-07-04 LAB — MAGNESIUM: Magnesium: 1.9 mg/dL (ref 1.7–2.4)

## 2022-07-04 LAB — PROCALCITONIN: Procalcitonin: <0.1 ng/mL

## 2022-07-04 MED ORDER — METHYLPREDNISOLONE SODIUM SUCC 125 MG IJ SOLR
125.0000 mg | Freq: Once | INTRAMUSCULAR | Status: AC
  Administered 2022-07-05: 125 mg via INTRAVENOUS
  Filled 2022-07-04: qty 2

## 2022-07-04 MED ORDER — POTASSIUM CHLORIDE CRYS ER 20 MEQ PO TBCR
40.0000 meq | EXTENDED_RELEASE_TABLET | Freq: Once | ORAL | Status: AC
Start: 2022-07-04 — End: 2022-07-04
  Administered 2022-07-04: 40 meq via ORAL
  Filled 2022-07-04: qty 2

## 2022-07-04 MED ORDER — IPRATROPIUM-ALBUTEROL 0.5-2.5 (3) MG/3ML IN SOLN
3.0000 mL | Freq: Once | RESPIRATORY_TRACT | Status: AC
  Administered 2022-07-05: 3 mL via RESPIRATORY_TRACT
  Filled 2022-07-04: qty 3

## 2022-07-04 MED ORDER — POTASSIUM CHLORIDE 10 MEQ/100ML IV SOLN
10.0000 meq | Freq: Once | INTRAVENOUS | Status: AC
  Administered 2022-07-04: 10 meq via INTRAVENOUS
  Filled 2022-07-04: qty 100

## 2022-07-04 MED ORDER — ONDANSETRON HCL 4 MG/2ML IJ SOLN
4.0000 mg | Freq: Once | INTRAMUSCULAR | Status: AC
  Administered 2022-07-04: 4 mg via INTRAVENOUS
  Filled 2022-07-04: qty 2

## 2022-07-04 MED ORDER — IOHEXOL 350 MG/ML SOLN
100.0000 mL | Freq: Once | INTRAVENOUS | Status: AC | PRN
  Administered 2022-07-04: 100 mL via INTRAVENOUS

## 2022-07-04 MED ORDER — SODIUM CHLORIDE 0.9 % IV BOLUS
1000.0000 mL | Freq: Once | INTRAVENOUS | Status: AC
  Administered 2022-07-04: 1000 mL via INTRAVENOUS

## 2022-07-04 NOTE — ED Provider Notes (Signed)
[facility] Provider Note    Event Date/Time   First MD Initiated Contact with Patient 07/04/22 2110     (approximate)   History   Rash and Weakness   HPI  Nicole Wyatt is a 56 y.o. female with COPD, sarcoidosis, hypertension, hyperlipidemia, seizures who comes in with concerns for weakness and rash.  Patient reports that she has had generalized weakness, rash, cough and a fever of 101 yesterday.  Patient reports that she has not taken any fever reducers yesterday but she did take Tylenol yesterday.  She reports that she is not sure if this could be a sarcoidosis flareup but she denies ever having a sarcoidosis flareup before.  She denies starting the steroids or the clotrimazole cream that was prescribed at [facility].  She states that she went to her primary care doctor today and they were having a hard time picking up her oxygen level therefore they took her by ambulance here.  Quite a patient her oxygen level was in the 60s but do not see this documented anywhere.  Patient was placed back on her 2 L that she supposed to be on at baseline.  Patient reports that she has not had any urination and feels dehydrated..  Patient just reports feeling overall weak  On review of records patient was seen at [facility].  She would have been put on simvastatin powder for the rash after returning from a trip to the beach.  The rash under the breast folds over the folds of the abdominal wall       Physical Exam   Triage Vital Signs: ED Triage Vitals  Enc Vitals Group     BP 07/04/22 1555 120/67     Pulse Rate 07/04/22 1555 62     Resp 07/04/22 1555 (!) 28     Temp 07/04/22 1555 97.6 F (36.4 C)     Temp Source 07/04/22 1555 Oral     SpO2 07/04/22 1555 98 %     Weight --      Height --      Head Circumference --      Peak Flow --      Pain Score 07/04/22 1556 9     Pain Loc --      Pain Edu? --      Excl. in GC? --     Most recent vital signs: Vitals:    07/04/22 1555  BP: 120/67  Pulse: 62  Resp: (!) 28  Temp: 97.6 F (36.4 C)  SpO2: 98%     General: Awake, no distress.  CV:  Good peripheral perfusion.  Resp:  Normal effort. Diffuse wheezing noted  Abd:  No distention.  Other:  Patient has a rash noted under the breast folds and in her folds of her adipose tissue no oral lesions noted.  Patient denies any vaginal lesions.  No lesions on the hands or feet.  Tender in the lower abdomen.   ED Results / Procedures / Treatments   Labs (all labs ordered are listed, but only abnormal results are displayed) Labs Reviewed  COMPREHENSIVE METABOLIC PANEL - Abnormal; Notable for the following components:      Result Value   Potassium 2.9 (*)    Total Protein 8.4 (*)    All other components within normal limits  SARS CORONAVIRUS 2 BY RT PCR  CULTURE, BLOOD (ROUTINE X 2)  CULTURE, BLOOD (ROUTINE X 2)  CBC WITH DIFFERENTIAL/PLATELET  URINALYSIS, ROUTINE W  REFLEX MICROSCOPIC  MAGNESIUM  PROCALCITONIN  PROCALCITONIN  LACTIC ACID, PLASMA  LACTIC ACID, PLASMA  TROPONIN I (HIGH SENSITIVITY)  TROPONIN I (HIGH SENSITIVITY)     EKG  My interpretation of EKG:  Normal sinus rhythm 85 without any ST elevation, bifascicular block, T wave version in V2 aVL, normal intervals.  Patient has similar bifascicular block and T wave inversion  RADIOLOGY I have reviewed the xray personally and interpreted and see no evidence of any pneumonia   PROCEDURES:  Critical Care performed: No  .1-3 Lead EKG Interpretation  Performed by: Concha Se, MD Authorized by: Concha Se, MD     Interpretation: normal     ECG rate:  60   ECG rate assessment: normal     Rhythm: sinus rhythm     Ectopy: none      MEDICATIONS ORDERED IN ED: Medications  sodium chloride 0.9 % bolus 1,000 mL (has no administration in time range)  iohexol (OMNIPAQUE) 350 MG/ML injection 100 mL (100 mLs Intravenous Contrast Given 07/04/22 2237)     IMPRESSION / MDM  / ASSESSMENT AND PLAN / ED COURSE  I reviewed the triage vital signs and the nursing notes.   Patient's presentation is most consistent with acute presentation with potential threat to life or bodily function.   Patient comes in with fevers, weakness, shortness of breath, lower abdominal pain.  CT imaging ordered to evaluate for any evidence of active sarcoidosis flare, pneumonia, PE, abdominal pathology, intercranial hemorrhage.  Given the report of a fever blood culture, lactate, procalcitonin were ordered  CBC shows normal white count. Troponin negative CMP low potassium at 2.9.  We will give some IV and oral repletion   No evidence of pulmonary embolism.   19 mm left thyroid nodule. Recommend thyroid US (ref: J Am Coll Radiol. 2015 Feb;12(2): 143-50).   No acute findings in the abdomen/pelvis.  Discussed with patient needing a ultrasound of the thyroid.  Patient reports that she does not have any of her oxygen any longer at home that she took herself off the 2 years ago and she no longer has it.  She does have some diffuse wheezing noted.  We will give some DuoNebs and steroids.  Given patient's weakness, hypokalemia and COPD flare we will discuss the hospital team for admission   The patient is on the cardiac monitor to evaluate for evidence of arrhythmia and/or significant heart rate changes.      FINAL CLINICAL IMPRESSION(S) / ED DIAGNOSES   Final diagnoses:  Hypokalemia  COPD exacerbation (HCC)  Weakness     Rx / DC Orders   ED Discharge Orders     None        Note:  This document was prepared using Dragon voice recognition software and may include unintentional dictation errors.   Concha Se, MD 07/04/22 2342

## 2022-07-04 NOTE — ED Triage Notes (Signed)
Pt to ED via [facility] from PCP. Pt was seen at [facility] for generalized weakness, rash, productive cough and fever (101.0) and states they did not do anything and was then seen by PCP. PCP called EMS due to pt being dizziness. Pt with hx Sarcoidosis and feels it is a bad flare up. Pt also reports decreased UO. Pt is suppose to be on 2L [place] chronically but has not been compliant.   EMS VS: [facility] 78 3L [place] - RA low 90s NSR 20g RFA

## 2022-07-04 NOTE — Discharge Instructions (Addendum)
No evidence of pulmonary embolism.    19 mm left thyroid nodule. Recommend thyroid US (ref: J Am Coll  Radiol. 2015 Feb;12(2): 143-50).    No acute findings in the abdomen/pelvis.    Aortic Atherosclerosis (ICD10-I70.0) and Emphysema

## 2022-07-04 NOTE — ED Provider Triage Note (Signed)
Emergency Medicine Provider Triage Evaluation Note  SHRITA THIEN , a 56 y.o. female  was evaluated in triage.  Pt complains of cough, weakness, rash. Sent in by PCP for possible sarcoidosis flare up. Reports Temp of 101.70F yesterday, took tylenol. No antipyretics today. On 2L home O2.  Review of Systems  Positive: Sob, weakness, cough Negative: Cp, abdominal pain  Physical Exam  BP 120/67 (BP Location: Left Arm)   Pulse 62   Temp 97.6 F (36.4 C) (Oral)   Resp (!) 28   SpO2 98%  Gen:   Awake, no distress   Resp:  Normal effort, on 2L [place] MSK:   Moves extremities without difficulty  Other:   Medical Decision Making  Medically screening exam initiated at 3:56 PM.  Appropriate orders placed.  Shahed L Dungee was informed that the remainder of the evaluation will be completed by another provider, this initial triage assessment does not replace that evaluation, and the importance of remaining in the ED until their evaluation is complete.     Jackelyn Hoehn, PA-C 07/04/22 1615

## 2022-07-04 NOTE — ED Provider Notes (Signed)
[facility] EMERGENCY DEPARTMENT Provider Note  CSN: [id number] Arrival date & time: 07/04/22 1541  Chief Complaint(s) Rash and Weakness  HPI Nicole Wyatt is a 56 y.o. female with PMH COPD, sarcoidosis, HTN, HLD, seizure disorder who presents emergency department for evaluation of a rash and weakness.  Patient states that she saw her PCP after [place] who gave her nystatin powder but her rash is not improving.  She states that her rash is primarily under the breast folds and over the folds of the abdominal wall but has spread to her arms and over her face.  She states that they are mildly itchy.  She endorses some mild weakness and believes that she is having a sarcoid flareup.  She is currently not endorsing any shortness of breath, chest pain, headache, fever or other systemic symptoms.  Patient does wear oxygen intermittently at home but is not hypoxic on room air here in the emergency department.   Past Medical History Past Medical History:  Diagnosis Date   Abnormal Pap smear of cervix    Anxiety    Arthritis    Bipolar 1 disorder (HCC)    COPD (chronic obstructive pulmonary disease) (HCC)    Depression    Dyspnea    with exertion    Dysrhythmia    History of kidney stones    Hyperlipidemia    Hypertension    Oxygen dependent    patient uses 2L oz PRN ; reports hardly ever uses it unless very SOB     [facility] spotted fever 2018   Sarcoidosis of lung (HCC)    managed by Dr Lindie Spruce    Seizures [facility]) last seizure 05/2013   Sleep apnea    uses CPAP nightly    UTI (urinary tract infection) 11/13/2018   see ED visit in epic ; was dc'd with oral abx and reports completed and relief of sx    Patient Active Problem List   Diagnosis Date Noted   Kidney stone 01/05/2020   Atopic dermatitis 09/08/2019   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/08/2019   Acute cystitis with hematuria 09/08/2019   Other fatigue 07/19/2019    Hypoglycemia 07/19/2019   Arm weakness 03/07/2019   Influenza A 02/24/2019   Acute upper respiratory infection 02/24/2019   Chronic obstructive pulmonary disease (HCC) 02/24/2019   Acute otitis externa of both ears 01/06/2019   Mild intermittent asthma without complication 01/06/2019   Primary osteoarthritis of hip 12/02/2018   Primary osteoarthritis of left hip 10/13/2018   OSA (obstructive sleep apnea) 10/13/2018   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   [place] spotted fever 07/02/2018   Urinary tract infection without hematuria 07/02/2018   Dysuria 06/23/2018   Depression, major, recurrent, moderate (HCC) 03/26/2018   Generalized edema 03/26/2018   Anaphylactic syndrome 03/26/2018   Cutaneous candidiasis 03/26/2018   Bursitis of hip 02/03/2018   Osteoarthritis of knee 02/03/2018   Chronic pulmonary hypertension (HCC) 06/18/2016   S/P cardiac cath 06/18/2016   Unstable angina (HCC) 05/24/2016   Bilateral leg edema 05/22/2016   Shortness of breath 04/30/2016   Skin lesion of right arm 03/01/2016   Screening for breast cancer 06/10/2015   Skin lesion of breast 06/10/2015   Hx of seizure disorder 06/24/2014   Left-sided weakness 06/24/2014   Tobacco abuse 06/24/2014   Home Medication(s) Prior to Admission medications   Medication Sig Start Date End Date Taking? Authorizing Provider  albuterol (VENTOLIN HFA) 108 (90 Base) MCG/ACT inhaler Inhale  2 puffs into the lungs every 4 (four) hours as needed for wheezing or shortness of breath. 04/17/21   Irean HongSung, Jade J, MD  Alum & Mag Hydroxide-Simeth (GI COCKTAIL) SUSP suspension Shake well. Each dose to containe 15mL maalox and 15mL viscous lidocaine 01/11/21   Minna AntisPaduchowski, Kevin, MD  amLODipine (NORVASC) 10 MG tablet Take 10 mg by mouth daily. 10/10/20   [provider]  aspirin EC 81 MG tablet Take 1 tablet (81 mg total) by mouth 2 (two) times daily. For DVT prophylaxis for 30 days after surgery. Patient taking differently: Take 81  mg by mouth daily. 12/02/18   Martensen, Lucretia KernHenry Calvin III, PA-C  azithromycin (ZITHROMAX Z-PAK) 250 MG tablet Take 2 tablets (500 mg) on  Day 1,  followed by 1 tablet (250 mg) once daily on Days 2 through 5. 09/21/21   Cuthriell, Delorise RoyalsJonathan D, PA-C  budesonide-formoterol (SYMBICORT) 160-4.5 MCG/ACT inhaler Inhale 2 puffs into the lungs 2 (two) times daily. 08/18/20   Coralyn HellingSood, Vineet, MD  EPINEPHrine 0.3 mg/0.3 mL IJ SOAJ injection Inject 0.3 mg into the muscle as needed for anaphylaxis.    [provider]  fluticasone (FLONASE) 50 MCG/ACT nasal spray Place 2 sprays into both nostrils daily. Patient taking differently: Place 2 sprays into both nostrils daily as needed for allergies. 02/16/19   Fisher, Roselyn BeringSusan W, PA-C  furosemide (LASIX) 40 MG tablet Take 1 tablet (40 mg total) by mouth daily as needed for fluid. Patient taking differently: Take 40 mg by mouth daily as needed for fluid or edema. 01/06/19   Carlean JewsBoscia, Heather E, NP  hydrochlorothiazide (MICROZIDE) 12.5 MG capsule Take 12.5 mg by mouth daily. 09/02/20   [provider]  meloxicam (MOBIC) 15 MG tablet Take 1 tablet (15 mg total) by mouth daily. 09/21/21   Cuthriell, Delorise RoyalsJonathan D, PA-C  methylPREDNISolone (MEDROL DOSEPAK) 4 MG TBPK tablet Take as prescribed 07/03/22   Tymeer Vaquera, MD  omeprazole (PRILOSEC) 40 MG capsule TAKE (1) CAPSULE TWICE DAILY BEFORE MEALS. 04/05/22   Toney ReilVanga, Rohini Reddy, MD  ondansetron (ZOFRAN) 4 MG tablet Take 4 mg by mouth every 8 (eight) hours as needed. 04/19/21   [provider]  ondansetron (ZOFRAN-ODT) 4 MG disintegrating tablet Take 1 tablet (4 mg total) by mouth every 8 (eight) hours as needed for nausea or vomiting. 09/21/21   Cuthriell, Delorise RoyalsJonathan D, PA-C  oxyCODONE-acetaminophen (PERCOCET) 5-325 MG tablet Take 1 tablet by mouth every 4 (four) hours as needed for severe pain. 12/18/21 12/18/22  Willy Eddyobinson, Patrick, MD  SPIRIVA HANDIHALER 18 MCG inhalation capsule 1 capsule daily. 09/14/20   [provider]  venlafaxine (EFFEXOR) 75 MG tablet Take by mouth. 08/03/20   [provider]                                                                                                                                    Past Surgical History Past Surgical History:  Procedure Laterality Date   ABDOMINAL HYSTERECTOMY  1999   BLADDER SURGERY     CARDIAC CATHETERIZATION Bilateral 05/29/2016   Procedure: Right/Left Heart Cath and Coronary Angiography;  Surgeon: Lamar Blinks, MD;  Location: [facility] INVASIVE CV LAB;  Service: Cardiovascular;  Laterality: Bilateral;   COLONOSCOPY     COLONOSCOPY N/A 07/19/2015   Procedure: COLONOSCOPY;  Surgeon: Earline Mayotte, MD;  Location: [facility] ENDOSCOPY;  Service: Endoscopy;  Laterality: N/A;   CYSTOSCOPY WITH STENT PLACEMENT Left 01/05/2020   Procedure: CYSTOSCOPY WITH STENT PLACEMENT;  Surgeon: Riki Altes, MD;  Location: [facility] ORS;  Service: Urology;  Laterality: Left;   CYSTOSCOPY/URETEROSCOPY/HOLMIUM LASER/STENT PLACEMENT Left 01/26/2020   Procedure: CYSTOSCOPY/URETEROSCOPY/HOLMIUM LASER/STENT Exchange;  Surgeon: Riki Altes, MD;  Location: [facility] ORS;  Service: Urology;  Laterality: Left;   ESOPHAGOGASTRODUODENOSCOPY (EGD) WITH PROPOFOL N/A 03/01/2021   Procedure: ESOPHAGOGASTRODUODENOSCOPY (EGD) WITH PROPOFOL;  Surgeon: Toney Reil, MD;  Location[facility] ENDOSCOPY;  Service: Gastroenterology;  Laterality: N/A;   ESOPHAGOGASTRODUODENOSCOPY (EGD) WITH PROPOFOL N/A 07/03/2021   Procedure: ESOPHAGOGASTRODUODENOSCOPY (EGD) WITH PROPOFOL;  Surgeon: Wyline Mood, MD;  Location: [facility] ENDOSCOPY;  Service: Gastroenterology;  Laterality: N/A;   FLEXIBLE BRONCHOSCOPY N/A 07/27/2016   Procedure: FLEXIBLE BRONCHOSCOPY;  Surgeon: Yevonne Pax, MD;  Location: [facility] ORS;  Service: Pulmonary;  Laterality: N/A;   FOOT SURGERY     JOINT REPLACEMENT Left    hip   KIDNEY STONE SURGERY  7 years   TOTAL HIP ARTHROPLASTY Left 12/02/2018   Procedure: TOTAL HIP  ARTHROPLASTY ANTERIOR APPROACH;  Surgeon: Sheral Apley, MD;  Location: [facility] ORS;  Service: Orthopedics;  Laterality: Left;   TUBAL LIGATION     Family History Family History  Problem Relation Age of Onset   Hypertension Mother    Arthritis Mother    Heart disease Mother    Heart failure Mother    Hypertension Father    Arthritis Father    Prostate cancer Father    Heart disease Father    Heart attack Father    Arrhythmia Father 60       pacemaker implant   Ovarian cancer Maternal Grandmother     Social History Social History   Tobacco Use   Smoking status: Every Day    Packs/day: 0.50    Years: 14.00    Total pack years: 7.00    Types: Cigarettes   Smokeless tobacco: Never  Vaping Use   Vaping Use: Never used  Substance Use Topics   Alcohol use: Not Currently    Alcohol/week: 2.0 standard drinks of alcohol    Types: 2 Glasses of wine per week   Drug use: No   Allergies Bee venom, Percocet [oxycodone-acetaminophen], Ciprofloxacin, and Penicillins  Review of Systems Review of Systems  Skin:  Positive for rash.    Physical Exam Vital Signs  I have reviewed the triage vital signs BP 120/67 (BP Location: Left Arm)   Pulse 62   Temp 97.6 F (36.4 C) (Oral)   Resp (!) 28   SpO2 98%   Physical Exam Vitals and nursing note reviewed.  Constitutional:      General: She is not in acute distress.    Appearance: She is well-developed.  HENT:     Head: Normocephalic and atraumatic.  Eyes:     Conjunctiva/sclera: Conjunctivae normal.  Cardiovascular:     Rate and Rhythm: Normal rate and regular rhythm.     Heart sounds: No murmur heard. Pulmonary:     Effort: Pulmonary  effort is normal. No respiratory distress.     Breath sounds: Normal breath sounds.  Abdominal:     Palpations: Abdomen is soft.     Tenderness: There is no abdominal tenderness.  Musculoskeletal:        General: No swelling.     Cervical back: Neck supple.  Skin:    General: Skin is  warm and dry.     Capillary Refill: Capillary refill takes less than 2 seconds.     Findings: Rash present.  Neurological:     Mental Status: She is alert.  Psychiatric:        Mood and Affect: Mood normal.     ED Results and Treatments Labs (all labs ordered are listed, but only abnormal results are displayed) Labs Reviewed  CBC WITH DIFFERENTIAL/PLATELET  COMPREHENSIVE METABOLIC PANEL  URINALYSIS, ROUTINE W REFLEX MICROSCOPIC  TROPONIN I (HIGH SENSITIVITY)                                                                                                                          Radiology No results found.  Pertinent labs & imaging results that were available during my care of the patient were reviewed by me and considered in my medical decision making (see MDM for details).  Medications Ordered in ED Medications - No data to display                                                                                                                                   Procedures Procedures  (including critical care time)  Medical Decision Making / ED Course   This patient presents to the ED for concern of rash, this involves an extensive number of treatment options, and is a complaint that carries with it a high risk of complications and morbidity.  The differential diagnosis includes cutaneous sarcoid, tinea, intertrigal Candida infection, contact dermatitis, urticaria  MDM: Patient seen emergency room for evaluation of a rash.  Physical exam with an erythematous pruritic rash worst under the breast folds bilaterally and over the abdominal folds with small nodules over the hands, arms and into the face.  Pulmonary exam is unremarkable and patient with no increased work of breathing.  Patient appears to been trying topical nystatin powder without success and given the intertrigal nature of the rash, we will trial clotrimazole and steroid taper to cover tinea infection as well as a  sarcoid flareup.  She has no chest pain or shortness of breath and at this time is not requiring any additional oxygen and thus additional imaging or laboratory evaluation was not pursued.  Patient states that she will follow-up with a primary care physician after obtaining her steroid burst in the morning.   Additional history obtained: -Additional history obtained from daughter -External records from outside source obtained and reviewed including: Chart review including previous notes, labs, imaging, consultation notes   Lab Tests: -I ordered, reviewed, and interpreted labs.   The pertinent results include:   Labs Reviewed  CBC WITH DIFFERENTIAL/PLATELET  COMPREHENSIVE METABOLIC PANEL  URINALYSIS, ROUTINE W REFLEX MICROSCOPIC  TROPONIN I (HIGH SENSITIVITY)    Medicines ordered and prescription drug management: No orders of the defined types were placed in this encounter.   -I have reviewed the patients home medicines and have made adjustments as needed  Critical interventions none   Cardiac Monitoring: The patient was maintained on a cardiac monitor.  I personally viewed and interpreted the cardiac monitored which showed an underlying rhythm of: NSR  Social Determinants of Health:  Factors impacting patients care include: none   Reevaluation: After the interventions noted above, I reevaluated the patient and found that they have :stayed the same  Co morbidities that complicate the patient evaluation  Past Medical History:  Diagnosis Date   Abnormal Pap smear of cervix    Anxiety    Arthritis    Bipolar 1 disorder (HCC)    COPD (chronic obstructive pulmonary disease) (HCC)    Depression    Dyspnea    with exertion    Dysrhythmia    History of kidney stones    Hyperlipidemia    Hypertension    Oxygen dependent    patient uses 2L oz PRN ; reports hardly ever uses it unless very SOB     [facility] spotted fever 2018   Sarcoidosis of lung (HCC)    managed by  Dr Lindie Spruce    Seizures [facility]) last seizure 05/2013   Sleep apnea    uses CPAP nightly    UTI (urinary tract infection) 11/13/2018   see ED visit in epic ; was dc'd with oral abx and reports completed and relief of sx       Dispostion: I considered admission for this patient, she currently does not meet inpatient criteria for admission, is safe for discharge with outpatient follow-up with clotrimazole that was delivered to bedside and steriod taper     Final Clinical Impression(s) / ED Diagnoses Final diagnoses:  None     @PCDICTATION @    , MD 07/04/22 1616

## 2022-07-05 DIAGNOSIS — J441 Chronic obstructive pulmonary disease with (acute) exacerbation: Secondary | ICD-10-CM

## 2022-07-05 DIAGNOSIS — F17210 Nicotine dependence, cigarettes, uncomplicated: Secondary | ICD-10-CM | POA: Diagnosis present

## 2022-07-05 DIAGNOSIS — E042 Nontoxic multinodular goiter: Secondary | ICD-10-CM | POA: Diagnosis present

## 2022-07-05 DIAGNOSIS — Z96642 Presence of left artificial hip joint: Secondary | ICD-10-CM | POA: Diagnosis present

## 2022-07-05 DIAGNOSIS — B372 Candidiasis of skin and nail: Secondary | ICD-10-CM

## 2022-07-05 DIAGNOSIS — F32A Depression, unspecified: Secondary | ICD-10-CM | POA: Diagnosis not present

## 2022-07-05 DIAGNOSIS — E785 Hyperlipidemia, unspecified: Secondary | ICD-10-CM | POA: Diagnosis present

## 2022-07-05 DIAGNOSIS — K219 Gastro-esophageal reflux disease without esophagitis: Secondary | ICD-10-CM | POA: Diagnosis present

## 2022-07-05 DIAGNOSIS — I452 Bifascicular block: Secondary | ICD-10-CM | POA: Diagnosis present

## 2022-07-05 DIAGNOSIS — Z8249 Family history of ischemic heart disease and other diseases of the circulatory system: Secondary | ICD-10-CM | POA: Diagnosis not present

## 2022-07-05 DIAGNOSIS — G40909 Epilepsy, unspecified, not intractable, without status epilepticus: Secondary | ICD-10-CM | POA: Diagnosis present

## 2022-07-05 DIAGNOSIS — Z79899 Other long term (current) drug therapy: Secondary | ICD-10-CM | POA: Diagnosis not present

## 2022-07-05 DIAGNOSIS — Z8261 Family history of arthritis: Secondary | ICD-10-CM | POA: Diagnosis not present

## 2022-07-05 DIAGNOSIS — E876 Hypokalemia: Secondary | ICD-10-CM

## 2022-07-05 DIAGNOSIS — R531 Weakness: Secondary | ICD-10-CM | POA: Diagnosis not present

## 2022-07-05 DIAGNOSIS — M1990 Unspecified osteoarthritis, unspecified site: Secondary | ICD-10-CM | POA: Diagnosis present

## 2022-07-05 DIAGNOSIS — Z791 Long term (current) use of non-steroidal anti-inflammatories (NSAID): Secondary | ICD-10-CM | POA: Diagnosis not present

## 2022-07-05 DIAGNOSIS — Z9981 Dependence on supplemental oxygen: Secondary | ICD-10-CM | POA: Diagnosis not present

## 2022-07-05 DIAGNOSIS — I10 Essential (primary) hypertension: Secondary | ICD-10-CM

## 2022-07-05 DIAGNOSIS — Z20822 Contact with and (suspected) exposure to covid-19: Secondary | ICD-10-CM | POA: Diagnosis present

## 2022-07-05 DIAGNOSIS — G4733 Obstructive sleep apnea (adult) (pediatric): Secondary | ICD-10-CM | POA: Diagnosis present

## 2022-07-05 DIAGNOSIS — Z7982 Long term (current) use of aspirin: Secondary | ICD-10-CM | POA: Diagnosis not present

## 2022-07-05 DIAGNOSIS — F319 Bipolar disorder, unspecified: Secondary | ICD-10-CM | POA: Diagnosis present

## 2022-07-05 DIAGNOSIS — Z7951 Long term (current) use of inhaled steroids: Secondary | ICD-10-CM | POA: Diagnosis not present

## 2022-07-05 DIAGNOSIS — D860 Sarcoidosis of lung: Secondary | ICD-10-CM | POA: Diagnosis present

## 2022-07-05 LAB — TSH: TSH: 2.626 u[IU]/mL (ref 0.350–4.500)

## 2022-07-05 LAB — URINALYSIS, ROUTINE W REFLEX MICROSCOPIC
Bacteria, UA: NONE SEEN
Bilirubin Urine: NEGATIVE
Glucose, UA: NEGATIVE mg/dL
Hgb urine dipstick: NEGATIVE
Ketones, ur: 5 mg/dL — AB
Leukocytes,Ua: NEGATIVE
Nitrite: NEGATIVE
Protein, ur: 30 mg/dL — AB
Specific Gravity, Urine: >1.046 — ABNORMAL HIGH (ref 1.005–1.030)
pH: 6 (ref 5.0–8.0)

## 2022-07-05 LAB — RPR
RPR Ser Ql: REACTIVE — AB
RPR Titer: 1:2 {titer}

## 2022-07-05 LAB — CBC
HCT: 40.1 % (ref 36.0–46.0)
Hemoglobin: 13.2 g/dL (ref 12.0–15.0)
MCH: 33.2 pg (ref 26.0–34.0)
MCHC: 32.9 g/dL (ref 30.0–36.0)
MCV: 100.8 fL — ABNORMAL HIGH (ref 80.0–100.0)
Platelets: 218 10*3/uL (ref 150–400)
RBC: 3.98 MIL/uL (ref 3.87–5.11)
RDW: 13.2 % (ref 11.5–15.5)
WBC: 6.1 10*3/uL (ref 4.0–10.5)
nRBC: 0 % (ref 0.0–0.2)

## 2022-07-05 LAB — BASIC METABOLIC PANEL
Anion gap: 5 (ref 5–15)
BUN: 11 mg/dL (ref 6–20)
CO2: 27 mmol/L (ref 22–32)
Calcium: 8.7 mg/dL — ABNORMAL LOW (ref 8.9–10.3)
Chloride: 109 mmol/L (ref 98–111)
Creatinine, Ser: 0.63 mg/dL (ref 0.44–1.00)
GFR, Estimated: >60 mL/min (ref >60)
Glucose, Bld: 134 mg/dL — ABNORMAL HIGH (ref 70–99)
Potassium: 3.6 mmol/L (ref 3.5–5.1)
Sodium: 141 mmol/L (ref 135–145)

## 2022-07-05 LAB — LACTIC ACID, PLASMA
Lactic Acid, Venous: 0.7 mmol/L (ref 0.5–1.9)
Lactic Acid, Venous: 1.5 mmol/L (ref 0.5–1.9)

## 2022-07-05 LAB — SARS CORONAVIRUS 2 BY RT PCR: SARS Coronavirus 2 by RT PCR: NEGATIVE

## 2022-07-05 LAB — HIV ANTIBODY (ROUTINE TESTING W REFLEX): HIV Screen 4th Generation wRfx: NONREACTIVE

## 2022-07-05 LAB — TROPONIN I (HIGH SENSITIVITY): Troponin I (High Sensitivity): 5 ng/L (ref <18)

## 2022-07-05 LAB — PROCALCITONIN: Procalcitonin: <0.1 ng/mL

## 2022-07-05 LAB — T4, FREE: Free T4: 1 ng/dL (ref 0.61–1.12)

## 2022-07-05 MED ORDER — TIOTROPIUM BROMIDE MONOHYDRATE 18 MCG IN CAPS: 1.0000 | ORAL_CAPSULE | Freq: Every day | RESPIRATORY_TRACT | Status: DC

## 2022-07-05 MED ORDER — ASPIRIN 81 MG PO TBEC: 81.0000 mg | DELAYED_RELEASE_TABLET | Freq: Every day | ORAL | Status: DC

## 2022-07-05 MED ORDER — MAGNESIUM HYDROXIDE 400 MG/5ML PO SUSP: 30.0000 mL | Freq: Every day | ORAL | Status: DC | PRN

## 2022-07-05 MED ORDER — TIOTROPIUM BROMIDE MONOHYDRATE 18 MCG IN CAPS
1.0000 | ORAL_CAPSULE | Freq: Every day | RESPIRATORY_TRACT | Status: DC
  Filled 2022-07-05: qty 5

## 2022-07-05 MED ORDER — IPRATROPIUM-ALBUTEROL 0.5-2.5 (3) MG/3ML IN SOLN
3.0000 mL | Freq: Four times a day (QID) | RESPIRATORY_TRACT | Status: DC
  Administered 2022-07-05: 3 mL via RESPIRATORY_TRACT
  Filled 2022-07-05: qty 3

## 2022-07-05 MED ORDER — ACETAMINOPHEN 325 MG PO TABS: 650.0000 mg | ORAL_TABLET | Freq: Four times a day (QID) | ORAL | Status: DC | PRN

## 2022-07-05 MED ORDER — FLUTICASONE PROPIONATE 50 MCG/ACT NA SUSP
2.0000 | Freq: Every day | NASAL | Status: DC | PRN
Start: 2022-07-05 — End: 2022-07-06

## 2022-07-05 MED ORDER — PANTOPRAZOLE SODIUM 40 MG PO TBEC
40.0000 mg | DELAYED_RELEASE_TABLET | Freq: Every day | ORAL | Status: DC
  Administered 2022-07-05 – 2022-07-06 (×2): 40 mg via ORAL
  Filled 2022-07-05 (×2): qty 1

## 2022-07-05 MED ORDER — OXYCODONE-ACETAMINOPHEN 5-325 MG PO TABS
1.0000 | ORAL_TABLET | ORAL | Status: DC | PRN
  Administered 2022-07-05: 1 via ORAL
  Filled 2022-07-05: qty 1

## 2022-07-05 MED ORDER — FUROSEMIDE 40 MG PO TABS: 40.0000 mg | ORAL_TABLET | Freq: Every day | ORAL | Status: DC | PRN

## 2022-07-05 MED ORDER — METHYLPREDNISOLONE SODIUM SUCC 40 MG IJ SOLR
40.0000 mg | Freq: Two times a day (BID) | INTRAMUSCULAR | Status: AC
  Administered 2022-07-05 (×2): 40 mg via INTRAVENOUS
  Filled 2022-07-05 (×2): qty 1

## 2022-07-05 MED ORDER — ENOXAPARIN SODIUM 60 MG/0.6ML IJ SOSY
0.5000 mg/kg | PREFILLED_SYRINGE | INTRAMUSCULAR | Status: DC
Start: 2022-07-05 — End: 2022-07-06
  Administered 2022-07-06: 52.5 mg via SUBCUTANEOUS
  Filled 2022-07-05 (×2): qty 0.6

## 2022-07-05 MED ORDER — ACETAMINOPHEN 650 MG RE SUPP: 650.0000 mg | Freq: Four times a day (QID) | RECTAL | Status: DC | PRN

## 2022-07-05 MED ORDER — IPRATROPIUM-ALBUTEROL 0.5-2.5 (3) MG/3ML IN SOLN
3.0000 mL | Freq: Four times a day (QID) | RESPIRATORY_TRACT | Status: DC
  Administered 2022-07-05 – 2022-07-06 (×5): 3 mL via RESPIRATORY_TRACT
  Filled 2022-07-05 (×5): qty 3

## 2022-07-05 MED ORDER — SODIUM CHLORIDE 0.9 % IV SOLN
1.0000 g | Freq: Every day | INTRAVENOUS | Status: DC
  Administered 2022-07-05: 1 g via INTRAVENOUS
  Filled 2022-07-05: qty 10

## 2022-07-05 MED ORDER — ONDANSETRON HCL 4 MG PO TABS: 4.0000 mg | ORAL_TABLET | Freq: Four times a day (QID) | ORAL | Status: DC | PRN

## 2022-07-05 MED ORDER — NYSTATIN 100000 UNIT/GM EX POWD
Freq: Two times a day (BID) | CUTANEOUS | Status: DC
  Filled 2022-07-05: qty 15

## 2022-07-05 MED ORDER — SODIUM CHLORIDE 0.9 % IV SOLN: INTRAVENOUS | Status: DC

## 2022-07-05 MED ORDER — AMLODIPINE BESYLATE 10 MG PO TABS
10.0000 mg | ORAL_TABLET | Freq: Every day | ORAL | Status: DC
  Administered 2022-07-05 – 2022-07-06 (×2): 10 mg via ORAL
  Filled 2022-07-05: qty 1
  Filled 2022-07-05: qty 2

## 2022-07-05 MED ORDER — VENLAFAXINE HCL 37.5 MG PO TABS
225.0000 mg | ORAL_TABLET | Freq: Every day | ORAL | Status: DC
  Administered 2022-07-05 – 2022-07-06 (×2): 225 mg via ORAL
  Filled 2022-07-05 (×2): qty 6

## 2022-07-05 MED ORDER — TRAZODONE HCL 50 MG PO TABS
25.0000 mg | ORAL_TABLET | Freq: Every evening | ORAL | Status: DC | PRN
  Administered 2022-07-05: 25 mg via ORAL
  Filled 2022-07-05: qty 1

## 2022-07-05 MED ORDER — ONDANSETRON HCL 4 MG/2ML IJ SOLN: 4.0000 mg | Freq: Four times a day (QID) | INTRAMUSCULAR | Status: DC | PRN

## 2022-07-05 MED ORDER — EPINEPHRINE 0.3 MG/0.3ML IJ SOAJ: 0.3000 mg | INTRAMUSCULAR | Status: DC | PRN

## 2022-07-05 MED ORDER — PREDNISONE 20 MG PO TABS
40.0000 mg | ORAL_TABLET | Freq: Every day | ORAL | Status: DC
  Administered 2022-07-06: 40 mg via ORAL
  Filled 2022-07-05: qty 2

## 2022-07-05 NOTE — Progress Notes (Addendum)
Same day note  Patient seen and examined at bedside.  Patient was admitted to the hospital for weakness, shortness of breath.  At the time of my evaluation, patient complains of shortness of breath and dyspnea but little better after admission.  Patient states that she was on oxygen at home but has been taken off since 1 year.  Continues to smoke  Physical examination reveals patient not in obvious distress, diminished breath sounds bilaterally, nasal cannula oxygen, mild wheezes  Laboratory data and imaging was reviewed   Assessment and Plan:    * COPD exacerbation (HCC) Continue IV Solu-Medrol bronchodilators mucolytic's IV Rocephin Spiriva placed on admission.  Currently on supplemental oxygen.  We will discontinue Rocephin, procalcitonin negative.  Of note patient was  on oxygen at some point in the past.  She stopped on her own and currently does not have home oxygen..  Will need to be assessed for home oxygen requirement on discharge.  Blood cultures sent from the ED negative in less than 12 hours. COVID test was negative.    No fever or leukocytosis.  Abdominal pain/cough fever.  CT scan of the chest abdomen and pelvis was performed which showed no evidence of pulmonary embolism.  19 mm left thyroid nodules.  No other acute findings   Hypokalemia Mild.  Improved with replacement.  Latest potassium of 3.6.   Skin candidiasis Continue nystatin powder.   Depression Continue Effexor   GERD without esophagitis Continue PPI   Essential hypertension On amlodipine and Lasix from home.  Morbid obesity.  Present on admission.  Will benefit from weight loss as outpatient.    No Charge  Signed,  Tenny Craw, MD [facility]

## 2022-07-05 NOTE — H&P (Addendum)
[place]   PATIENT NAME: Nicole Wyatt    MR#:  [id number]  DATE OF BIRTH:  04/16/1966  DATE OF ADMISSION:  07/04/2022  PRIMARY CARE PHYSICIAN: [facility], [facility]   Patient is coming from: Home  REQUESTING/REFERRING PHYSICIAN: Artis Delay, MD  CHIEF COMPLAINT:   Chief Complaint  Patient presents with   Rash   Weakness    HISTORY OF PRESENT ILLNESS:  Nicole Wyatt is a 56 y.o. African-American female with medical history significant for bipolar 1 disorder, COPD, depression, anxiety, hypertension and dyslipidemia as well as seizure disorder and sleep apnea on CPAP, who presented to the emergency room with a [place] of worsening dyspnea with associated cough productive of greenish sputum as well as wheezing over the last couple of days.  She admits to tactile fever and chills as well as nausea without vomiting or diarrhea.  She denies any abdominal pain.  No melena or bright red bleeding per rectum.  No dysuria, oliguria or hematuria or flank pain.  She has rash under her breast.  ED Course: Upon presenting to the emergency room BP was 142/89 and pulse oximetry was 98% on 2 L of O2 by nasal cannula with otherwise normal vital signs.  Labs revealed hypokalemia of 2.9 otherwise unremarkable CMP.  High-sensitivity troponin was 4 and later 5.  Lactic acid 0.7 and later 1.5.  Procalcitonin was less than 0.1.  CBC was within normal.  TSH was 2.62 and free T4 1 COVID-19 PCR is currently negative.  UA showed specific gravity more than 1046 with 30 protein and 5 ketones. EKG as reviewed by me : EKG showed normal sinus rhythm with a rate of 85 with right bundle branch block and left intrafascicular block (bifascicular block) and LV. Imaging: Abdominal pelvic CT scan revealed no acute findings.  Chest CT revealed 19 mm left thyroid nodule with recommendation for thyroid ultrasound.  There was no evidence for PE  The patient was given DuoNebs twice, IV Solu-Medrol, IV Zofran,  10 mcg IV and 40 mcg p.o. potassium chloride and 1 L bolus of IV normal saline.  She will be admitted to a medical telemetry bed for further evaluation and management. PAST MEDICAL HISTORY:   Past Medical History:  Diagnosis Date   Abnormal Pap smear of cervix    Anxiety    Arthritis    Bipolar 1 disorder (HCC)    COPD (chronic obstructive pulmonary disease) (HCC)    Depression    Dyspnea    with exertion    Dysrhythmia    History of kidney stones    Hyperlipidemia    Hypertension    Oxygen dependent    patient uses 2L oz PRN ; reports hardly ever uses it unless very SOB     [facility] spotted fever 2018   Sarcoidosis of lung (HCC)    managed by Dr Lindie Spruce    Seizures [facility]) last seizure 05/2013   Sleep apnea    uses CPAP nightly    UTI (urinary tract infection) 11/13/2018   see ED visit in [facility] ; was dc'd with oral abx and reports completed and relief of sx     PAST SURGICAL HISTORY:   Past Surgical History:  Procedure Laterality Date   ABDOMINAL HYSTERECTOMY  1999   BLADDER SURGERY     CARDIAC CATHETERIZATION Bilateral 05/29/2016   Procedure: Right/Left Heart Cath and Coronary Angiography;  Surgeon: Lamar Blinks, MD;  Location: [facility] INVASIVE CV LAB;  Service: Cardiovascular;  Laterality: Bilateral;   COLONOSCOPY     COLONOSCOPY N/A 07/19/2015   Procedure: COLONOSCOPY;  Surgeon: Earline Mayotte, MD;  Location: [facility] ENDOSCOPY;  Service: Endoscopy;  Laterality: N/A;   CYSTOSCOPY WITH STENT PLACEMENT Left 01/05/2020   Procedure: CYSTOSCOPY WITH STENT PLACEMENT;  Surgeon: Riki Altes, MD;  Location: [facility] ORS;  Service: Urology;  Laterality: Left;   CYSTOSCOPY/URETEROSCOPY/HOLMIUM LASER/STENT PLACEMENT Left 01/26/2020   Procedure: CYSTOSCOPY/URETEROSCOPY/HOLMIUM LASER/STENT Exchange;  Surgeon: Riki Altes, MD;  Location: [facility] ORS;  Service: Urology;  Laterality: Left;   ESOPHAGOGASTRODUODENOSCOPY (EGD) WITH PROPOFOL N/A 03/01/2021   Procedure:  ESOPHAGOGASTRODUODENOSCOPY (EGD) WITH PROPOFOL;  Surgeon: Toney Reil, MD;  Location: [facility] ENDOSCOPY;  Service: Gastroenterology;  Laterality: N/A;   ESOPHAGOGASTRODUODENOSCOPY (EGD) WITH PROPOFOL N/A 07/03/2021   Procedure: ESOPHAGOGASTRODUODENOSCOPY (EGD) WITH PROPOFOL;  Surgeon: Wyline Mood, MD;  Location: [facility] ENDOSCOPY;  Service: Gastroenterology;  Laterality: N/A;   FLEXIBLE BRONCHOSCOPY N/A 07/27/2016   Procedure: FLEXIBLE BRONCHOSCOPY;  Surgeon: Yevonne Pax, MD;  Location: [facility] ORS;  Service: Pulmonary;  Laterality: N/A;   FOOT SURGERY     JOINT REPLACEMENT Left    hip   KIDNEY STONE SURGERY  7 years   TOTAL HIP ARTHROPLASTY Left 12/02/2018   Procedure: TOTAL HIP ARTHROPLASTY ANTERIOR APPROACH;  Surgeon: Sheral Apley, MD;  Location: [facility] ORS;  Service: Orthopedics;  Laterality: Left;   TUBAL LIGATION      SOCIAL HISTORY:   Social History   Tobacco Use   Smoking status: Every Day    Packs/day: 0.50    Years: 14.00    Total pack years: 7.00    Types: Cigarettes   Smokeless tobacco: Never  Substance Use Topics   Alcohol use: Not Currently    Alcohol/week: 2.0 standard drinks of alcohol    Types: 2 Glasses of wine per week    FAMILY HISTORY:   Family History  Problem Relation Age of Onset   Hypertension Mother    Arthritis Mother    Heart disease Mother    Heart failure Mother    Hypertension Father    Arthritis Father    Prostate cancer Father    Heart disease Father    Heart attack Father    Arrhythmia Father 29       pacemaker implant   Ovarian cancer Maternal Grandmother     DRUG ALLERGIES:   Allergies  Allergen Reactions   Bee Venom Anaphylaxis   Percocet [Oxycodone-Acetaminophen] Nausea And Vomiting    Hallucinations    Ciprofloxacin Nausea Only   Penicillins Diarrhea and Rash    Did it involve swelling of the face/tongue/throat, SOB, or low BP? No Did it involve sudden or severe rash/hives, skin peeling, or any reaction on the inside of  your mouth or nose? No Did you need to seek medical attention at a hospital or doctor's office? No When did it last happen?      4-5 years If all above answers are "NO", may proceed with cephalosporin use.      REVIEW OF SYSTEMS:   ROS As per history of present illness. All pertinent systems were reviewed above. Constitutional, HEENT, cardiovascular, respiratory, GI, GU, musculoskeletal, neuro, psychiatric, endocrine, integumentary and hematologic systems were reviewed and are otherwise negative/unremarkable except for positive findings mentioned above in the HPI.   MEDICATIONS AT HOME:   Prior to Admission medications   Medication Sig Start Date End Date Taking? Authorizing Provider  albuterol (VENTOLIN HFA) 108 (90 Base) MCG/ACT inhaler  Inhale 2 puffs into the lungs every 4 (four) hours as needed for wheezing or shortness of breath. 04/17/21   Irean Hong, MD  Alum & Mag Hydroxide-Simeth (GI COCKTAIL) SUSP suspension Shake well. Each dose to containe 49mL maalox and 87mL viscous lidocaine 01/11/21   Minna Antis, MD  amLODipine (NORVASC) 10 MG tablet Take 10 mg by mouth daily. 10/10/20   [provider]  aspirin EC 81 MG tablet Take 1 tablet (81 mg total) by mouth 2 (two) times daily. For DVT prophylaxis for 30 days after surgery. Patient taking differently: Take 81 mg by mouth daily. 12/02/18   Martensen, Lucretia Kern III, PA-C  azithromycin (ZITHROMAX Z-PAK) 250 MG tablet Take 2 tablets (500 mg) on  Day 1,  followed by 1 tablet (250 mg) once daily on Days 2 through 5. 09/21/21   Cuthriell, Delorise Royals, PA-C  budesonide-formoterol (SYMBICORT) 160-4.5 MCG/ACT inhaler Inhale 2 puffs into the lungs 2 (two) times daily. 08/18/20   Coralyn Helling, MD  EPINEPHrine 0.3 mg/0.3 mL IJ SOAJ injection Inject 0.3 mg into the muscle as needed for anaphylaxis.    [provider]  fluticasone (FLONASE) 50 MCG/ACT nasal spray Place 2 sprays into both nostrils daily. Patient taking  differently: Place 2 sprays into both nostrils daily as needed for allergies. 02/16/19   Fisher, Roselyn Bering, PA-C  furosemide (LASIX) 40 MG tablet Take 1 tablet (40 mg total) by mouth daily as needed for fluid. Patient taking differently: Take 40 mg by mouth daily as needed for fluid or edema. 01/06/19   Carlean Jews, NP  hydrochlorothiazide (MICROZIDE) 12.5 MG capsule Take 12.5 mg by mouth daily. 09/02/20   [provider]  meloxicam (MOBIC) 15 MG tablet Take 1 tablet (15 mg total) by mouth daily. 09/21/21   Cuthriell, Delorise Royals, PA-C  methylPREDNISolone (MEDROL DOSEPAK) 4 MG TBPK tablet Take as prescribed 07/03/22   Kommor, [place], MD  omeprazole (PRILOSEC) 40 MG capsule TAKE (1) CAPSULE TWICE DAILY BEFORE MEALS. 04/05/22   Toney Reil, MD  ondansetron (ZOFRAN) 4 MG tablet Take 4 mg by mouth every 8 (eight) hours as needed. 04/19/21   [provider]  ondansetron (ZOFRAN-ODT) 4 MG disintegrating tablet Take 1 tablet (4 mg total) by mouth every 8 (eight) hours as needed for nausea or vomiting. 09/21/21   Cuthriell, Delorise Royals, PA-C  oxyCODONE-acetaminophen (PERCOCET) 5-325 MG tablet Take 1 tablet by mouth every 4 (four) hours as needed for severe pain. 12/18/21 12/18/22  Willy Eddy, MD  SPIRIVA HANDIHALER 18 MCG inhalation capsule 1 capsule daily. 09/14/20   [provider]  venlafaxine (EFFEXOR) 75 MG tablet Take by mouth. 08/03/20   [provider]      VITAL SIGNS:  Blood pressure 111/68, pulse (!) 54, temperature 97.6 F (36.4 C), temperature source Oral, resp. rate (!) 21, SpO2 99 %.  PHYSICAL EXAMINATION:  Physical Exam  GENERAL:  56 y.o.-year-old African-American female patient lying in the bed with no acute distress.  EYES: Pupils equal, round, reactive to light and accommodation. No scleral icterus. Extraocular muscles intact.  HEENT: Head atraumatic, normocephalic. Oropharynx and nasopharynx clear.  NECK:  Supple, no jugular venous  distention. No thyroid enlargement, no tenderness.  LUNGS: Residual diffuse expiratory wheezes with slightly diminished expiratory airflow and harsh vesicular breathing.  No use of accessory muscles of respiration.  CARDIOVASCULAR: Regular rate and rhythm, S1, S2 normal. No murmurs, rubs, or gallops.  ABDOMEN: Soft, nondistended, nontender. Bowel sounds present. No organomegaly or  mass.  EXTREMITIES: No pedal edema, cyanosis, or clubbing.  NEUROLOGIC: Cranial nerves II through XII are intact. Muscle strength 5/5 in all extremities. Sensation intact. Gait not checked.  PSYCHIATRIC: The patient is alert and oriented x 3.  Normal affect and good eye contact. SKIN: She has an inframammary candidal eruption with no other lesion, or ulcer.   LABORATORY PANEL:   CBC Recent Labs  Lab 07/05/22 0447  WBC 6.1  HGB 13.2  HCT 40.1  PLT 218   ------------------------------------------------------------------------------------------------------------------  Chemistries  Recent Labs  Lab 07/04/22 1559 07/05/22 0447  NA 141 141  K 2.9* 3.6  CL 103 109  CO2 25 27  GLUCOSE 97 134*  BUN 12 11  CREATININE 0.76 0.63  CALCIUM 9.5 8.7*  MG 1.9  --   AST 27  --   ALT 19  --   ALKPHOS 83  --   BILITOT 0.8  --    ------------------------------------------------------------------------------------------------------------------  Cardiac Enzymes No results for input(s): "TROPONINI" in the last 168 hours. ------------------------------------------------------------------------------------------------------------------  RADIOLOGY:  CT Angio Chest PE W and/or Wo Contrast  Result Date: 07/04/2022 CLINICAL DATA:  Cough, fever, rash, abdominal pain. Dizziness. Evaluate for PE. History of sarcoidosis. EXAM: CT ANGIOGRAPHY CHEST CT ABDOMEN AND PELVIS WITH CONTRAST TECHNIQUE: Multidetector CT imaging of the chest was performed using the standard protocol during bolus administration of intravenous  contrast. Multiplanar CT image reconstructions and MIPs were obtained to evaluate the vascular anatomy. Multidetector CT imaging of the abdomen and pelvis was performed using the standard protocol during bolus administration of intravenous contrast. RADIATION DOSE REDUCTION: This exam was performed according to the departmental dose-optimization program which includes automated exposure control, adjustment of the mA and/or kV according to patient size and/or use of iterative reconstruction technique. CONTRAST:  OMNIPAQUE IOHEXOL 350 MG/ML SOLN COMPARISON:  CT abdomen/pelvis dated 12/18/2021. CTA chest dated 04/17/2021. FINDINGS: CTA CHEST FINDINGS Cardiovascular: Satisfactory opacification of the bilateral pulmonary arteries to the segmental level. No evidence of pulmonary embolism. Although not tailored for evaluation of the thoracic aorta, there is no evidence thoracic aortic aneurysm or dissection. Atherosclerotic calcifications of the aortic arch. Heart is top-normal in size.  No pericardial effusion. Mediastinum/Nodes: No suspicious mediastinal lymphadenopathy. Enlarged left thyroid with nodules measuring up to 19 mm (series 2/image 9), similar to priors. Lungs/Pleura: Mild centrilobular emphysematous changes, upper lung predominant. No suspicious pulmonary nodules. Minimal dependent atelectasis in the bilateral lower lobes. No focal consolidation. No pleural effusion or pneumothorax. Musculoskeletal: Visualized osseous structures are within normal limits. Review of the MIP images confirms the above findings. CT ABDOMEN and PELVIS FINDINGS Hepatobiliary: Liver is within normal limits. Gallbladder is unremarkable. No intrahepatic or extrahepatic ductal dilatation. Pancreas: Within normal limits. Spleen: Within normal limits. Adrenals/Urinary Tract: Bilateral adrenal nodules, measuring up to 2.3 cm on the right, unchanged, compatible with benign adrenal adenomas given stability. No follow-up is recommended.  Kidneys are within normal limits.  No hydronephrosis. Bladder is within normal limits. Stomach/Bowel: Stomach is within normal limits. No evidence of bowel obstruction. Normal appendix (series 5/image 69). No colonic wall thickening or inflammatory changes. Vascular/Lymphatic: No evidence of abdominal aortic aneurysm. No suspicious abdominopelvic lymphadenopathy. Reproductive: Status post hysterectomy. Left ovary is within normal limits.  No right adnexal mass. Other: No abdominopelvic ascites. Musculoskeletal: Visualized osseous structures are within normal limits. Review of the MIP images confirms the above findings. IMPRESSION: No evidence of pulmonary embolism. 19 mm left thyroid nodule. Recommend thyroid US (ref: J Am Coll Radiol. 2015 Feb;12(2):  143-50). No acute findings in the abdomen/pelvis. Aortic Atherosclerosis (ICD10-I70.0) and Emphysema (ICD10-J43.9). Electronically Signed   By: Charline Bills M.D.   On: 07/04/2022 23:23   CT ABDOMEN PELVIS W CONTRAST  Result Date: 07/04/2022 CLINICAL DATA:  Cough, fever, rash, abdominal pain. Dizziness. Evaluate for PE. History of sarcoidosis. EXAM: CT ANGIOGRAPHY CHEST CT ABDOMEN AND PELVIS WITH CONTRAST TECHNIQUE: Multidetector CT imaging of the chest was performed using the standard protocol during bolus administration of intravenous contrast. Multiplanar CT image reconstructions and MIPs were obtained to evaluate the vascular anatomy. Multidetector CT imaging of the abdomen and pelvis was performed using the standard protocol during bolus administration of intravenous contrast. RADIATION DOSE REDUCTION: This exam was performed according to the departmental dose-optimization program which includes automated exposure control, adjustment of the mA and/or kV according to patient size and/or use of iterative reconstruction technique. CONTRAST:  OMNIPAQUE IOHEXOL 350 MG/ML SOLN COMPARISON:  CT abdomen/pelvis dated 12/18/2021. CTA chest dated 04/17/2021.  FINDINGS: CTA CHEST FINDINGS Cardiovascular: Satisfactory opacification of the bilateral pulmonary arteries to the segmental level. No evidence of pulmonary embolism. Although not tailored for evaluation of the thoracic aorta, there is no evidence thoracic aortic aneurysm or dissection. Atherosclerotic calcifications of the aortic arch. Heart is top-normal in size.  No pericardial effusion. Mediastinum/Nodes: No suspicious mediastinal lymphadenopathy. Enlarged left thyroid with nodules measuring up to 19 mm (series 2/image 9), similar to priors. Lungs/Pleura: Mild centrilobular emphysematous changes, upper lung predominant. No suspicious pulmonary nodules. Minimal dependent atelectasis in the bilateral lower lobes. No focal consolidation. No pleural effusion or pneumothorax. Musculoskeletal: Visualized osseous structures are within normal limits. Review of the MIP images confirms the above findings. CT ABDOMEN and PELVIS FINDINGS Hepatobiliary: Liver is within normal limits. Gallbladder is unremarkable. No intrahepatic or extrahepatic ductal dilatation. Pancreas: Within normal limits. Spleen: Within normal limits. Adrenals/Urinary Tract: Bilateral adrenal nodules, measuring up to 2.3 cm on the right, unchanged, compatible with benign adrenal adenomas given stability. No follow-up is recommended. Kidneys are within normal limits.  No hydronephrosis. Bladder is within normal limits. Stomach/Bowel: Stomach is within normal limits. No evidence of bowel obstruction. Normal appendix (series 5/image 69). No colonic wall thickening or inflammatory changes. Vascular/Lymphatic: No evidence of abdominal aortic aneurysm. No suspicious abdominopelvic lymphadenopathy. Reproductive: Status post hysterectomy. Left ovary is within normal limits.  No right adnexal mass. Other: No abdominopelvic ascites. Musculoskeletal: Visualized osseous structures are within normal limits. Review of the MIP images confirms the above findings.  IMPRESSION: No evidence of pulmonary embolism. 19 mm left thyroid nodule. Recommend thyroid US (ref: J Am Coll Radiol. 2015 Feb;12(2): 143-50). No acute findings in the abdomen/pelvis. Aortic Atherosclerosis (ICD10-I70.0) and Emphysema (ICD10-J43.9). Electronically Signed   By: Charline Bills M.D.   On: 07/04/2022 23:23   CT HEAD WO CONTRAST ( )  Result Date: 07/04/2022 CLINICAL DATA:  Dizziness, weakness EXAM: CT HEAD WITHOUT CONTRAST TECHNIQUE: Contiguous axial images were obtained from the base of the skull through the vertex without intravenous contrast. RADIATION DOSE REDUCTION: This exam was performed according to the departmental dose-optimization program which includes automated exposure control, adjustment of the mA and/or kV according to patient size and/or use of iterative reconstruction technique. COMPARISON:  03/07/2019 FINDINGS: Brain: No evidence of acute infarction, hemorrhage, hydrocephalus, extra-axial collection or mass lesion/mass effect. Vascular: No hyperdense vessel or unexpected calcification. Skull: Normal. Negative for fracture or focal lesion. Sinuses/Orbits: The visualized paranasal sinuses are essentially clear. The mastoid air cells are unopacified. Other: None. IMPRESSION: Normal head CT. Electronically  Signed   By: Charline BillsSriyesh  Krishnan M.D.   On: 07/04/2022 23:17   DG Chest 2 View  Result Date: 07/04/2022 CLINICAL DATA:  Cough EXAM: CHEST - 2 VIEW COMPARISON:  Chest x-ray dated April 16, 2021 FINDINGS: The heart size and mediastinal contours are within normal limits. Mild right basilar atelectasis. Both lungs are otherwise clear. Contour abnormality of the right diaphragm, unchanged compared with prior exam and likely due to eventration. The visualized skeletal structures are unremarkable. IMPRESSION: No active cardiopulmonary disease. Electronically Signed   By: Allegra LaiLeah  Strickland M.D.   On: 07/04/2022 16:44      IMPRESSION AND PLAN:  Assessment and Plan: * COPD  exacerbation (HCC) -She will be admitted to a medical telemetry bed. - This could be contributing to her generalized weakness. - We will continue steroid therapy with IV Solu-Medrol. - We will continue bronchodilator therapy with DuoNebs 4 times daily and every 4 hours as needed. - Mucolytic therapy will be provided. - She will be placed on IV Rocephin. - We will continue Spiriva and hold Symbicort. - PT consult will be obtained.  Hypokalemia - Potassium will be replaced and followed.  Magnesium was normal.  Skin candidiasis - We will continue continue her on nystatin powder  Depression - We will continue Effexor.  GERD without esophagitis We will continue PPI therapy.  Essential hypertension - We will continue her antihypertensives   DVT prophylaxis: Lovenox. Advanced Care Planning:  Code Status: full code. Family Communication:  The plan of care was discussed in details with the patient (and family). I answered all questions. The patient agreed to proceed with the above mentioned plan. Further management will depend upon hospital course. Disposition Plan: Back to previous home environment Consults called: none. All the records are reviewed and case discussed with ED provider.  Status is: Inpatient   At the time of the admission, it appears that the appropriate admission status for this patient is inpatient.  This is judged to be reasonable and necessary in order to provide the required intensity of service to ensure the patient's safety given the presenting symptoms, physical exam findings and initial radiographic and laboratory data in the context of comorbid conditions.  The patient requires inpatient status due to high intensity of service, high risk of further deterioration and high frequency of surveillance required.  I certify that at the time of admission, it is my clinical judgment that the patient will require inpatient hospital care extending more than 2  midnights.                            Dispo: The patient is from: Home              Anticipated d/c is to: Home              Patient currently is not medically stable to d/c.              Difficult to place patient: No  Hannah BeatJan A Lalanya Rufener M.D on 07/05/2022 at 6:35 AM  [facility]   From 7 PM-7 AM, contact night-coverage [email or web address]  CC: Primary care physician; [facility]

## 2022-07-05 NOTE — Progress Notes (Signed)
Admission profile updated. ?

## 2022-07-05 NOTE — Assessment & Plan Note (Signed)
-   Potassium will be replaced and followed.  Magnesium was normal.

## 2022-07-05 NOTE — Clinical Social Work Note (Signed)
  Transition of Care (TOC) Screening Note   Patient Details  Name: Nicole Wyatt Date of Birth: 1966/05/02   Transition of Care [facility]) CM/SW Contact:    Maree Krabbe, LCSW Phone Number:[phone] 07/05/2022, 1:40 PM    Transition of Care Department (TOC) has reviewed patient and no TOC needs have been identified at this time. We will continue to monitor patient advancement through interdisciplinary progression rounds. If new patient transition needs arise, please place a TOC consult.

## 2022-07-05 NOTE — Progress Notes (Signed)
PHARMACIST - PHYSICIAN COMMUNICATION  CONCERNING:  Enoxaparin (Lovenox) for DVT Prophylaxis    RECOMMENDATION: Patient was prescribed enoxaprin 40mg  q24 hours for VTE prophylaxis.   There were no vitals filed for this visit.  There is no height or weight on file to calculate BMI.  Estimated Creatinine Clearance: 95.9 mL/min (by C-G formula based on SCr of 0.76 mg/dL).   Based on [facility] policy patient is candidate for enoxaparin 0.5mg /kg TBW SQ every 24 hours based on BMI being >30.  DESCRIPTION: Pharmacy has adjusted enoxaparin dose per [facility] policy.  Patient is now receiving enoxaparin 0.5 mg/kg every 24 hours   [facility], PharmD, [facility] 07/05/2022 1:42 AM

## 2022-07-05 NOTE — Assessment & Plan Note (Signed)
-   We will continue Effexor. 

## 2022-07-05 NOTE — ED Notes (Signed)
Pt given breakfast tray, will do other 8 AM meds after pt eats

## 2022-07-05 NOTE — Plan of Care (Signed)

## 2022-07-05 NOTE — Assessment & Plan Note (Addendum)
-  She will be admitted to a medical telemetry bed. - This could be contributing to her generalized weakness. - We will continue steroid therapy with IV Solu-Medrol. - We will continue bronchodilator therapy with DuoNebs 4 times daily and every 4 hours as needed. - Mucolytic therapy will be provided. - She will be placed on IV Rocephin. - We will continue Spiriva and hold Symbicort. - PT consult will be obtained.

## 2022-07-05 NOTE — Assessment & Plan Note (Signed)
-   We will continue PPI therapy 

## 2022-07-05 NOTE — Assessment & Plan Note (Signed)
-   We will continue continue her on nystatin powder

## 2022-07-05 NOTE — Evaluation (Signed)
Physical Therapy Evaluation Patient Details Name: Nicole Wyatt MRN: [id number] DOB: March 09, 1966 Today's Date: 07/05/2022  History of Present Illness  Nicole Wyatt is a 56 y.o. African-American female with medical history significant for bipolar 1 disorder, COPD, depression, anxiety, hypertension and dyslipidemia as well as seizure disorder and sleep apnea on CPAP, who presented to the emergency room with a [place] of worsening dyspnea with associated cough productive of greenish sputum as well as wheezing over the last couple of days.  She admits to tactile fever and chills as well as nausea without vomiting or diarrhea.  She denies any abdominal pain.  No melena or bright red bleeding per rectum.  No dysuria, oliguria or hematuria or flank pain.  Labs revealed hypokalemia of 2.9 otherwise unremarkable   Clinical Impression  Patient received in bed, playing games on tablet. She is pleasant and cooperative. Agrees to PT assessment. Patient had nasal canula donned at 2 liters upon arrival with O2 sats at 98%. Removed nasal canula and ambulated 150 feet without AD and supervision only. O2 remained > 95% throughout activity on room air. Patient with mild fatigue, no significant difficulties requiring continued skilled PT at this time. Patient left in bed on room air, RN aware. She does not require further follow up. Signing off.        Recommendations for follow up therapy are one component of a multi-disciplinary discharge planning process, led by the attending physician.  Recommendations may be updated based on patient status, additional functional criteria and insurance authorization.  Follow Up Recommendations No PT follow up      Assistance Recommended at Discharge None  Patient can return home with the following       Equipment Recommendations    Recommendations for Other Services       Functional Status Assessment Patient has not had a recent decline in their functional status      Precautions / Restrictions Precautions Precautions: None Restrictions Weight Bearing Restrictions: No      Mobility  Bed Mobility Overal bed mobility: Independent                  Transfers Overall transfer level: Independent                      Ambulation/Gait Ambulation/Gait assistance: Independent Gait Distance (Feet): 150 Feet Assistive device: None Gait Pattern/deviations: WFL(Within Functional Limits) Gait velocity: slightly slowed     General Gait Details: no significant difficulties noted, mild fatigue, O2 sats > 95% throughout session on room air.  Stairs            Wheelchair Mobility    Modified Rankin (Stroke Patients Only)       Balance Overall balance assessment: Independent                                           Pertinent Vitals/Pain Pain Assessment Pain Assessment: Faces Faces Pain Scale: Hurts a little bit Pain Location: cramping feeling on left side ( rib area) Pain Descriptors / Indicators: Discomfort Pain Intervention(s): Monitored during session    Home Living Family/patient expects to be discharged to:: Private residence Living Arrangements: Other relatives Available Help at Discharge: Family;Available PRN/intermittently Type of Home: House Home Access: Ramped entrance       Home Layout: One level Home Equipment: None      Prior Function  Prior Level of Function : Independent/Modified Independent             Mobility Comments: not using AD prior to admission ADLs Comments: independent     Hand Dominance        Extremity/Trunk Assessment   Upper Extremity Assessment Upper Extremity Assessment: Overall WFL for tasks assessed    Lower Extremity Assessment Lower Extremity Assessment: Overall WFL for tasks assessed    Cervical / Trunk Assessment Cervical / Trunk Assessment: Normal  Communication   Communication: No difficulties  Cognition Arousal/Alertness:  Awake/alert Behavior During Therapy: WFL for tasks assessed/performed Overall Cognitive Status: Within Functional Limits for tasks assessed                                          General Comments      Exercises     Assessment/Plan    PT Assessment Patient does not need any further PT services  PT Problem List         PT Treatment Interventions      PT Goals (Current goals can be found in the Care Plan section)  Acute Rehab PT Goals Patient Stated Goal: to return home PT Goal Formulation: With patient Time For Goal Achievement: 07/06/22 Potential to Achieve Goals: Good    Frequency       Co-evaluation               AM-PAC PT "6 Clicks" Mobility  Outcome Measure Help needed turning from your back to your side while in a flat bed without using bedrails?: None Help needed moving from lying on your back to sitting on the side of a flat bed without using bedrails?: None Help needed moving to and from a bed to a chair (including a wheelchair)?: None Help needed standing up from a chair using your arms (e.g., wheelchair or bedside chair)?: None Help needed to walk in hospital room?: None Help needed climbing 3-5 steps with a railing? : None 6 Click Score: 24    End of Session Equipment Utilized During Treatment: Oxygen Activity Tolerance: Patient tolerated treatment well Patient left: in bed;with call bell/phone within reach Nurse Communication: Mobility status      Time: 0370-4888 PT Time Calculation (min) (ACUTE ONLY): 8 min   Charges:   PT Evaluation $PT Eval Low Complexity: 1 Low          Timothy Townsel, PT, GCS 07/05/22,1:41 PM

## 2022-07-05 NOTE — Assessment & Plan Note (Signed)
-   We will continue her antihypertensives. 

## 2022-07-06 DIAGNOSIS — F32A Depression, unspecified: Secondary | ICD-10-CM

## 2022-07-06 LAB — T.PALLIDUM AB, TOTAL: T Pallidum Abs: NONREACTIVE

## 2022-07-06 LAB — CBC
HCT: 39.8 % (ref 36.0–46.0)
Hemoglobin: 13.4 g/dL (ref 12.0–15.0)
MCH: 33.9 pg (ref 26.0–34.0)
MCHC: 33.7 g/dL (ref 30.0–36.0)
MCV: 100.8 fL — ABNORMAL HIGH (ref 80.0–100.0)
Platelets: 250 K/uL (ref 150–400)
RBC: 3.95 MIL/uL (ref 3.87–5.11)
RDW: 13.2 % (ref 11.5–15.5)
WBC: 11.4 K/uL — ABNORMAL HIGH (ref 4.0–10.5)
nRBC: 0 % (ref 0.0–0.2)

## 2022-07-06 LAB — BASIC METABOLIC PANEL
Anion gap: 9 (ref 5–15)
BUN: 15 mg/dL (ref 6–20)
CO2: 27 mmol/L (ref 22–32)
Calcium: 9.3 mg/dL (ref 8.9–10.3)
Chloride: 107 mmol/L (ref 98–111)
Creatinine, Ser: 0.66 mg/dL (ref 0.44–1.00)
GFR, Estimated: >60 mL/min (ref >60)
Glucose, Bld: 119 mg/dL — ABNORMAL HIGH (ref 70–99)
Potassium: 4.4 mmol/L (ref 3.5–5.1)
Sodium: 143 mmol/L (ref 135–145)

## 2022-07-06 LAB — PROCALCITONIN: Procalcitonin: <0.1 ng/mL

## 2022-07-06 LAB — MAGNESIUM: Magnesium: 2 mg/dL (ref 1.7–2.4)

## 2022-07-06 MED ORDER — NYSTATIN 100000 UNIT/GM EX POWD: Freq: Four times a day (QID) | CUTANEOUS | 0 refills | Status: DC

## 2022-07-06 NOTE — TOC Initial Note (Signed)
Transition of Care [facility]) - Initial/Assessment Note    Patient Details  Name: Nicole Wyatt MRN: [id number] Date of Birth: 30-Oct-1966  [facility] of [facility]) CM/SW Contact:    Liliana Cline, LCSW Phone Number: 07/06/2022, 1:37 PM  Clinical Narrative:                 CSW spoke to patient regarding DC planning. Patient lives with her cousin who is unable to drive due to medical issues. Patient typically drives herself to appointments. Patient states she has no one else to transport her home. Requesting taxi. Patient's PCP is Dr. Okey Dupre at [facility] Pharmacy is [facility] in [place].  Patient states she has a CPAP at home but it is older and needs a new one. She is not sure what company it is through. Patient also needs new home o2 set up, sat note is in by RN.  CSW spoke with Ian Malkin with [employer]. New o2 ordered and to be delivered today in preparation for DC.  Ian Malkin stated patient will need to follow up with her PCP on getting a new CPAP. Ian Malkin will have Melissa from [employer] follow up with patient when she gets home to coordinate this. Patient will need to make a PCP appointment. Patient verbalized understanding.  Cab voucher provided for [employer]. RN to call once o2 is delivered.     Barriers to Discharge: Barriers Resolved   Patient Goals and CMS Choice Patient states their goals for this hospitalization and ongoing recovery are:: to return home CMS Medicare.gov Compare Post Acute Care list provided to:: Patient Choice offered to / list presented to : Patient  Expected Discharge Plan and Services         Living arrangements for the past 2 months: Single Family Home Expected Discharge Date: 07/06/22               DME Arranged: Oxygen DME Agency: [employer] Date DME Agency Contacted: 07/06/22   Representative spoke with at DME Agency: Ian Malkin            Prior Living Arrangements/Services Living arrangements for the past 2 months: Single Family  Home Lives with:: Relatives Patient language and need for interpreter reviewed:: Yes Do you feel safe going back to the place where you live?: Yes      Need for Family Participation in Patient Care: Yes (Comment) Care giver support system in place?: Yes (comment) Current home services: DME Criminal Activity/Legal Involvement Pertinent to Current Situation/Hospitalization: No - Comment as needed  Activities of Daily Living Home Assistive Devices/Equipment: None ADL Screening (condition at time of admission) Patient's cognitive ability adequate to safely complete daily activities?: Yes Is the patient deaf or have difficulty hearing?: No Does the patient have difficulty seeing, even when wearing glasses/contacts?: No Does the patient have difficulty concentrating, remembering, or making decisions?: No Patient able to express need for assistance with ADLs?: Yes Does the patient have difficulty dressing or bathing?: No Independently performs ADLs?: Yes (appropriate for developmental age) Does the patient have difficulty walking or climbing stairs?: No Weakness of Legs: None Weakness of Arms/Hands: None  Permission Sought/Granted Permission sought to share information with : Facility Industrial/product designer granted to share information with : Yes, Verbal Permission Granted     Permission granted to share info w AGENCY: DME companies        Emotional Assessment       Orientation: : Oriented to Self, Oriented to Place, Oriented to  Time, Oriented  to Situation Alcohol / Substance Use: Not Applicable Psych Involvement: No (comment)  Admission diagnosis:  Hypokalemia [E87.6] Weakness [R53.1] COPD exacerbation (HCC) [J44.1] Patient Active Problem List   Diagnosis Date Noted   Obesity, Class III, BMI 40-49.9 (morbid obesity) (HCC) 07/06/2022   COPD exacerbation (HCC) 07/05/2022   Essential hypertension 07/05/2022   GERD without esophagitis 07/05/2022   Depression  07/05/2022   Hypokalemia 07/05/2022   Skin candidiasis 07/05/2022   Kidney stone 01/05/2020   Atopic dermatitis 09/08/2019   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/08/2019   Acute cystitis with hematuria 09/08/2019   Other fatigue 07/19/2019   Hypoglycemia 07/19/2019   Arm weakness 03/07/2019   Influenza A 02/24/2019   Acute upper respiratory infection 02/24/2019   Chronic obstructive pulmonary disease (HCC) 02/24/2019   Acute otitis externa of both ears 01/06/2019   Mild intermittent asthma without complication 01/06/2019   Primary osteoarthritis of hip 12/02/2018   Primary osteoarthritis of left hip 10/13/2018   OSA (obstructive sleep apnea) 10/13/2018   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   [place] spotted fever 07/02/2018   Urinary tract infection without hematuria 07/02/2018   Dysuria 06/23/2018   Depression, major, recurrent, moderate (HCC) 03/26/2018   Generalized edema 03/26/2018   Anaphylactic syndrome 03/26/2018   Cutaneous candidiasis 03/26/2018   Bursitis of hip 02/03/2018   Osteoarthritis of knee 02/03/2018   Chronic pulmonary hypertension (HCC) 06/18/2016   S/P cardiac cath 06/18/2016   Unstable angina (HCC) 05/24/2016   Bilateral leg edema 05/22/2016   Shortness of breath 04/30/2016   Skin lesion of right arm 03/01/2016   Screening for breast cancer 06/10/2015   Skin lesion of breast 06/10/2015   Hx of seizure disorder 06/24/2014   Left-sided weakness 06/24/2014   Tobacco abuse 06/24/2014   PCP:  [facility] Pharmacy:   [facility] MED [facility]IST[facility] [address] [place] [address] Phone: [phone] Fax: [phone]  [facility] - [place], [place] - [address] [facility] [address] [place] [address] Phone: [phone] Fax: [phone]  [facility] - [place], [place] - [address] [place] [address] Phone: [phone] Fax: [phone]     Social  Determinants of Health (SDOH) Interventions    Readmission Risk Interventions    07/06/2022    1:32 PM  Readmission Risk Prevention Plan  Post Dischage Appt Complete  Medication Screening Complete  Transportation Screening Complete

## 2022-07-06 NOTE — Progress Notes (Signed)
Pt discharged home via taxi. PIV removed. Discharge packet reviewed with patient, verbalized understanding. Home o2 equipment delivered to patient.

## 2022-07-06 NOTE — Progress Notes (Signed)
Performed walk test on patient. Started at 100% on RA. When ambulating, desat down to 82-85%. O2 sats came back up to 95% or better when placed on 2L O2.

## 2022-07-06 NOTE — Progress Notes (Signed)
SATURATION QUALIFICATIONS: (This note is used to comply with regulatory documentation for home oxygen)  Patient Saturations on Room Air at Rest = 100%  Patient Saturations on Room Air while Ambulating = 82-85%  Patient Saturations on 2 Liters of oxygen while Ambulating = 95%  Please briefly explain why patient needs home oxygen: Pt 100% on RA when resting. Pt desat down to 82-85% when ambulating on RA. O2 sat increased to 95% when placed on 2L.

## 2022-07-06 NOTE — Plan of Care (Signed)

## 2022-07-06 NOTE — Discharge Summary (Signed)
Physician Discharge Summary   Patient: Nicole Wyatt MRN: [id number] DOB: 1966/04/27  Admit date:     07/04/2022  Discharge date: 07/06/22  Discharge Physician: Joycelyn Das   PCP: [facility]   Recommendations at discharge:   Continue to use oxygen without interruption at 2 L/min.  Patient used to be on oxygen in the past but has not been using it.  Follow-up with your primary care provider in 1 week.   Check CBC, BMP in the next visit.    Discharge Diagnoses: Principal Problem:   COPD exacerbation (HCC) Active Problems:   Hypokalemia   Skin candidiasis   Essential hypertension   GERD without esophagitis   Depression   Obesity, Class III, BMI 40-49.9 (morbid obesity) (HCC)  Resolved Problems:   * No resolved hospital problems. *  Hospital Course: Nicole Wyatt is a 56 y.o. African-American female with past medical history of bipolar disorder, COPD, depression, anxiety, hypertension, seizure disorder and dyslipidemia presented to hospital with worsening dyspnea, cough greenish sputum production and wheezing.  In the ED, patient was noted to have blood pressure of 142/89 and pulse oximetry of 98% on 2 L of O2 by nasal cannula with otherwise normal vital signs.  Labs revealed hypokalemia of 2.9. High-sensitivity troponin was 4 and later 5.  Lactic acid 0.7 and later 1.5.  Procalcitonin was less than 0.1.  CBC was within normal.  TSH was 2.62 and free T4 1 COVID-19 PCR is currently negative.  UA showed specific gravity more than 1046 with 30 protein and 5 ketones.  EKG showed normal sinus rhythm.   CT scan showed 19 mm thyroid nodule and no pulmonary embolism.  Patient was given DuoNeb Solu-Medrol potassium and was admitted hospital for further evaluation and treatment..  Following conditions were addressed during hospitalization,  Acute COPD exacerbation (HCC) Patient received IV Solu-Medrol, bronchodilators, mucolytic's, Spiriva during hospitalization.  Of  note patient was on 2 L of oxygen in the past but has not been using for the last 1 year or so.  At this time patient has qualified for home oxygen.   Cultures negative in 1 day.Marland Kitchen COVID test was negative.    No fever or leukocytosis.  Patient will continue Medrol Dosepak from home, inhalers from home on discharge with supplemental oxygen   Abdominal pain/cough fever.  Resolved.   CT scan of the chest abdomen and pelvis was performed which showed no evidence of pulmonary embolism.  19 mm left thyroid nodules.  No other acute findings.  Temperature prior to discharge was 97.5.   Hypokalemia Mild.  Improved with replacement.  Latest potassium of 4.4 prior to discharge.   Skin candidiasis Continue nystatin powder.   Depression Continue Effexor   GERD without esophagitis Continue PPI   Essential hypertension On amlodipine and Lasi at home.  Will resume on discharge  Generalized weakness.  On presentation.  Improved at this time.  Physical therapy recommended no PT needs.   Morbid obesity.  Present on admission. 56  Will benefit from weight loss as outpatient  Consultants: None  Procedures performed: None  Disposition: Home  Diet recommendation:  Discharge Diet Orders (From admission, onward)     Start     Ordered   07/06/22 0000  Diet - low sodium heart healthy        07/06/22 1257           Cardiac diet  DISCHARGE MEDICATION: Allergies as of 07/06/2022       Reactions  Bee Venom Anaphylaxis   Percocet [oxycodone-acetaminophen] Nausea And Vomiting   Hallucinations    Ciprofloxacin Nausea Only   Penicillins Diarrhea, Rash   Did it involve swelling of the face/tongue/throat, SOB, or low BP? No Did it involve sudden or severe rash/hives, skin peeling, or any reaction on the inside of your mouth or nose? No Did you need to seek medical attention at a hospital or doctor's office? No When did it last happen?      4-5 years If all above answers are "NO", may proceed with  cephalosporin use.        Medication List     STOP taking these medications    azithromycin 250 MG tablet Commonly known as: Zithromax Z-Pak   hydrochlorothiazide 12.5 MG capsule Commonly known as: MICROZIDE   HYDROcodone-acetaminophen 5-325 MG tablet Commonly known as: NORCO/VICODIN   hydrOXYzine 25 MG tablet Commonly known as: ATARAX   ibuprofen 800 MG tablet Commonly known as: ADVIL   ondansetron 4 MG disintegrating tablet Commonly known as: ZOFRAN-ODT   oxyCODONE-acetaminophen 5-325 MG tablet Commonly known as: Percocet       TAKE these medications    albuterol 108 (90 Base) MCG/ACT inhaler Commonly known as: VENTOLIN HFA Inhale 2 puffs into the lungs every 4 (four) hours as needed for wheezing or shortness of breath.   amLODipine 10 MG tablet Commonly known as: NORVASC Take 10 mg by mouth daily.   aspirin EC 81 MG tablet Take 1 tablet (81 mg total) by mouth 2 (two) times daily. For DVT prophylaxis for 30 days after surgery. What changed:  when to take this additional instructions   budesonide-formoterol 160-4.5 MCG/ACT inhaler Commonly known as: SYMBICORT Inhale 2 puffs into the lungs 2 (two) times daily.   EPINEPHrine 0.3 mg/0.3 mL Soaj injection Commonly known as: EPI-PEN Inject 0.3 mg into the muscle as needed for anaphylaxis.   fluticasone 50 MCG/ACT nasal spray Commonly known as: FLONASE Place 2 sprays into both nostrils daily. What changed:  when to take this reasons to take this   furosemide 40 MG tablet Commonly known as: LASIX Take 1 tablet (40 mg total) by mouth daily as needed for fluid. What changed: reasons to take this   gi cocktail Susp suspension Shake well. Each dose to containe 30mL maalox and 10mL viscous lidocaine   meloxicam 15 MG tablet Commonly known as: MOBIC Take 1 tablet (15 mg total) by mouth daily.   methylPREDNISolone 4 MG Tbpk tablet Commonly known as: MEDROL DOSEPAK Take as prescribed   nystatin  powder Commonly known as: MYCOSTATIN/NYSTOP Apply topically 4 (four) times daily.   omeprazole 40 MG capsule Commonly known as: PRILOSEC TAKE (1) CAPSULE TWICE DAILY BEFORE MEALS.   Spiriva HandiHaler 18 MCG inhalation capsule Generic drug: tiotropium Place 18 mcg into inhaler and inhale daily.   traZODone 100 MG tablet Commonly known as: DESYREL Take 1 tablet twice a day by oral route.   venlafaxine 75 MG tablet Commonly known as: EFFEXOR Take 225 mg by mouth daily.               Durable Medical Equipment  (From admission, onward)           Start     Ordered   07/06/22 1222  For home use only DME oxygen  Once       Question Answer Comment  Length of Need Lifetime   Mode or (Route) Nasal cannula   Liters per Minute 2   Frequency Continuous (stationary  and portable oxygen unit needed)   Oxygen conserving device Yes   Oxygen delivery system Gas      07/06/22 1222           Subjective   Today, patient was seen and examined at bedside.  Feels okay.  Has mild cough.  Denies any chest pain nausea vomiting fever or dizziness.  Discharge Exam: Filed Weights   07/05/22 1234  Weight: 105.8 kg      07/06/2022    7:47 AM 07/06/2022    4:38 AM 07/05/2022    8:55 PM  Vitals with BMI  Systolic 125 117 267  Diastolic 57 69 61  Pulse 64 43 75     General:  Average built, not in obvious distress, on nasal cannula oxygen HENT:   No scleral pallor or icterus noted. Oral mucosa is moist.  Chest:    Diminished breath sounds bilaterally. No crackles or wheezes.  CVS: S1 &S2 heard. No murmur.  Regular rate and rhythm. Abdomen: Soft, nontender, nondistended.  Bowel sounds are heard.   Extremities: No cyanosis, clubbing or edema.  Peripheral pulses are palpable. Psych: Alert, awake and oriented, normal mood CNS:  No cranial nerve deficits.  Power equal in all extremities.   Skin: Warm and dry.  No rashes noted.   Condition at discharge: good  The results of  significant diagnostics from this hospitalization (including imaging, microbiology, ancillary and laboratory) are listed below for reference.   Imaging Studies: CT Angio Chest PE W and/or Wo Contrast  Result Date: 07/04/2022 CLINICAL DATA:  Cough, fever, rash, abdominal pain. Dizziness. Evaluate for PE. History of sarcoidosis. EXAM: CT ANGIOGRAPHY CHEST CT ABDOMEN AND PELVIS WITH CONTRAST TECHNIQUE: Multidetector CT imaging of the chest was performed using the standard protocol during bolus administration of intravenous contrast. Multiplanar CT image reconstructions and MIPs were obtained to evaluate the vascular anatomy. Multidetector CT imaging of the abdomen and pelvis was performed using the standard protocol during bolus administration of intravenous contrast. RADIATION DOSE REDUCTION: This exam was performed according to the departmental dose-optimization program which includes automated exposure control, adjustment of the mA and/or kV according to patient size and/or use of iterative reconstruction technique. CONTRAST:  OMNIPAQUE IOHEXOL 350 MG/ML SOLN COMPARISON:  CT abdomen/pelvis dated 12/18/2021. CTA chest dated 04/17/2021. FINDINGS: CTA CHEST FINDINGS Cardiovascular: Satisfactory opacification of the bilateral pulmonary arteries to the segmental level. No evidence of pulmonary embolism. Although not tailored for evaluation of the thoracic aorta, there is no evidence thoracic aortic aneurysm or dissection. Atherosclerotic calcifications of the aortic arch. Heart is top-normal in size.  No pericardial effusion. Mediastinum/Nodes: No suspicious mediastinal lymphadenopathy. Enlarged left thyroid with nodules measuring up to 19 mm (series 2/image 9), similar to priors. Lungs/Pleura: Mild centrilobular emphysematous changes, upper lung predominant. No suspicious pulmonary nodules. Minimal dependent atelectasis in the bilateral lower lobes. No focal consolidation. No pleural effusion or pneumothorax.  Musculoskeletal: Visualized osseous structures are within normal limits. Review of the MIP images confirms the above findings. CT ABDOMEN and PELVIS FINDINGS Hepatobiliary: Liver is within normal limits. Gallbladder is unremarkable. No intrahepatic or extrahepatic ductal dilatation. Pancreas: Within normal limits. Spleen: Within normal limits. Adrenals/Urinary Tract: Bilateral adrenal nodules, measuring up to 2.3 cm on the right, unchanged, compatible with benign adrenal adenomas given stability. No follow-up is recommended. Kidneys are within normal limits.  No hydronephrosis. Bladder is within normal limits. Stomach/Bowel: Stomach is within normal limits. No evidence of bowel obstruction. Normal appendix (series 5/image 69). No colonic wall  thickening or inflammatory changes. Vascular/Lymphatic: No evidence of abdominal aortic aneurysm. No suspicious abdominopelvic lymphadenopathy. Reproductive: Status post hysterectomy. Left ovary is within normal limits.  No right adnexal mass. Other: No abdominopelvic ascites. Musculoskeletal: Visualized osseous structures are within normal limits. Review of the MIP images confirms the above findings. IMPRESSION: No evidence of pulmonary embolism. 19 mm left thyroid nodule. Recommend thyroid US (ref: J Am Coll Radiol. 2015 Feb;12(2): 143-50). No acute findings in the abdomen/pelvis. Aortic Atherosclerosis (ICD10-I70.0) and Emphysema (ICD10-J43.9). Electronically Signed   By: Charline BillsSriyesh  Krishnan M.D.   On: 07/04/2022 23:23   CT ABDOMEN PELVIS W CONTRAST  Result Date: 07/04/2022 CLINICAL DATA:  Cough, fever, rash, abdominal pain. Dizziness. Evaluate for PE. History of sarcoidosis. EXAM: CT ANGIOGRAPHY CHEST CT ABDOMEN AND PELVIS WITH CONTRAST TECHNIQUE: Multidetector CT imaging of the chest was performed using the standard protocol during bolus administration of intravenous contrast. Multiplanar CT image reconstructions and MIPs were obtained to evaluate the vascular anatomy.  Multidetector CT imaging of the abdomen and pelvis was performed using the standard protocol during bolus administration of intravenous contrast. RADIATION DOSE REDUCTION: This exam was performed according to the departmental dose-optimization program which includes automated exposure control, adjustment of the mA and/or kV according to patient size and/or use of iterative reconstruction technique. CONTRAST:  100mL OMNIPAQUE IOHEXOL 350 MG/ML SOLN COMPARISON:  CT abdomen/pelvis dated 12/18/2021. CTA chest dated 04/17/2021. FINDINGS: CTA CHEST FINDINGS Cardiovascular: Satisfactory opacification of the bilateral pulmonary arteries to the segmental level. No evidence of pulmonary embolism. Although not tailored for evaluation of the thoracic aorta, there is no evidence thoracic aortic aneurysm or dissection. Atherosclerotic calcifications of the aortic arch. Heart is top-normal in size.  No pericardial effusion. Mediastinum/Nodes: No suspicious mediastinal lymphadenopathy. Enlarged left thyroid with nodules measuring up to 19 mm (series 2/image 9), similar to priors. Lungs/Pleura: Mild centrilobular emphysematous changes, upper lung predominant. No suspicious pulmonary nodules. Minimal dependent atelectasis in the bilateral lower lobes. No focal consolidation. No pleural effusion or pneumothorax. Musculoskeletal: Visualized osseous structures are within normal limits. Review of the MIP images confirms the above findings. CT ABDOMEN and PELVIS FINDINGS Hepatobiliary: Liver is within normal limits. Gallbladder is unremarkable. No intrahepatic or extrahepatic ductal dilatation. Pancreas: Within normal limits. Spleen: Within normal limits. Adrenals/Urinary Tract: Bilateral adrenal nodules, measuring up to 2.3 cm on the right, unchanged, compatible with benign adrenal adenomas given stability. No follow-up is recommended. Kidneys are within normal limits.  No hydronephrosis. Bladder is within normal limits. Stomach/Bowel:  Stomach is within normal limits. No evidence of bowel obstruction. Normal appendix (series 5/image 69). No colonic wall thickening or inflammatory changes. Vascular/Lymphatic: No evidence of abdominal aortic aneurysm. No suspicious abdominopelvic lymphadenopathy. Reproductive: Status post hysterectomy. Left ovary is within normal limits.  No right adnexal mass. Other: No abdominopelvic ascites. Musculoskeletal: Visualized osseous structures are within normal limits. Review of the MIP images confirms the above findings. IMPRESSION: No evidence of pulmonary embolism. 19 mm left thyroid nodule. Recommend thyroid US (ref: J Am Coll Radiol. 2015 Feb;12(2): 143-50). No acute findings in the abdomen/pelvis. Aortic Atherosclerosis (ICD10-I70.0) and Emphysema (ICD10-J43.9). Electronically Signed   By: Charline BillsSriyesh  Krishnan M.D.   On: 07/04/2022 23:23   CT HEAD WO CONTRAST (5MM)  Result Date: 07/04/2022 CLINICAL DATA:  Dizziness, weakness EXAM: CT HEAD WITHOUT CONTRAST TECHNIQUE: Contiguous axial images were obtained from the base of the skull through the vertex without intravenous contrast. RADIATION DOSE REDUCTION: This exam was performed according to the departmental dose-optimization program which includes automated  exposure control, adjustment of the mA and/or kV according to patient size and/or use of iterative reconstruction technique. COMPARISON:  03/07/2019 FINDINGS: Brain: No evidence of acute infarction, hemorrhage, hydrocephalus, extra-axial collection or mass lesion/mass effect. Vascular: No hyperdense vessel or unexpected calcification. Skull: Normal. Negative for fracture or focal lesion. Sinuses/Orbits: The visualized paranasal sinuses are essentially clear. The mastoid air cells are unopacified. Other: None. IMPRESSION: Normal head CT. Electronically Signed   By: Charline Bills M.D.   On: 07/04/2022 23:17   DG Chest 2 View  Result Date: 07/04/2022 CLINICAL DATA:  Cough EXAM: CHEST - 2 VIEW COMPARISON:   Chest x-ray dated April 16, 2021 FINDINGS: The heart size and mediastinal contours are within normal limits. Mild right basilar atelectasis. Both lungs are otherwise clear. Contour abnormality of the right diaphragm, unchanged compared with prior exam and likely due to eventration. The visualized skeletal structures are unremarkable. IMPRESSION: No active cardiopulmonary disease. Electronically Signed   By: Allegra Lai M.D.   On: 07/04/2022 16:44    Microbiology: Results for orders placed or performed during the hospital encounter of 07/04/22  SARS Coronavirus 2 by RT PCR (hospital order, performed in [facility] hospital lab) *cepheid single result test*     Status: None   Collection Time: 07/04/22 11:57 PM   Specimen: Nasal Swab  Result Value Ref Range Status   SARS Coronavirus 2 by RT PCR NEGATIVE NEGATIVE Final    Comment: (NOTE) SARS-CoV-2 target nucleic acids are NOT DETECTED.  The SARS-CoV-2 RNA is generally detectable in upper and lower respiratory specimens during the acute phase of infection. The lowest concentration of SARS-CoV-2 viral copies this assay can detect is 250 copies / mL. A negative result does not preclude SARS-CoV-2 infection and should not be used as the sole basis for treatment or other patient management decisions.  A negative result may occur with improper specimen collection / handling, submission of specimen other than nasopharyngeal swab, presence of viral mutation(s) within the areas targeted by this assay, and inadequate number of viral copies (<250 copies / mL). A negative result must be combined with clinical observations, patient history, and epidemiological information.  Fact Sheet for Patients:   RoadLapTop.co.za  Fact Sheet for Healthcare Providers: http://kim-miller.com/  This test is not yet approved or  cleared by the [place] FDA and has been authorized for detection and/or diagnosis of  SARS-CoV-2 by FDA under an Emergency Use Authorization (EUA).  This EUA will remain in effect (meaning this test can be used) for the duration of the COVID-19 declaration under Section 564(b)(1) of the Act, 21 U.S.C. section 360bbb-3(b)(1), unless the authorization is terminated or revoked sooner.  Performed at [facility], [address] [place], [place] [address]   Culture, blood (Routine X 2) w Reflex to ID Panel     Status: None (Preliminary result)   Collection Time: 07/04/22 11:57 PM   Specimen: BLOOD  Result Value Ref Range Status   Specimen Description BLOOD LEFT FOREARM  Final   Special Requests   Final    BOTTLES DRAWN AEROBIC AND ANAEROBIC Blood Culture results may not be optimal due to an inadequate volume of blood received in culture bottles   Culture   Final    NO GROWTH 1 DAY Performed at [facility], [address], [place], [place] [address]    Report Status PENDING  Incomplete  Culture, blood (Routine X 2) w Reflex to ID Panel     Status: None (Preliminary result)  Collection Time: 07/04/22 11:58 PM   Specimen: BLOOD  Result Value Ref Range Status   Specimen Description BLOOD LEFT ASSIST CONTROL  Final   Special Requests   Final    BOTTLES DRAWN AEROBIC AND ANAEROBIC Blood Culture adequate volume   Culture   Final    NO GROWTH 1 DAY Performed at [facility], [address] [place], [place] [address]    Report Status PENDING  Incomplete    Labs: CBC: Recent Labs  Lab 07/04/22 1559 07/05/22 0447 07/06/22 0458  WBC 6.1 6.1 11.4*  NEUTROABS 3.5  --   --   HGB 14.1 13.2 13.4  HCT 41.6 40.1 39.8  MCV 97.4 100.8* 100.8*  PLT 272 218 250   Basic Metabolic Panel: Recent Labs  Lab 07/04/22 1559 07/05/22 0447 07/06/22 0458  NA 141 141 143  K 2.9* 3.6 4.4  CL 103 109 107  CO2 GLUCOSE 97 134* 119*  BUN CREATININE 0.76 0.63 0.66  CALCIUM 9.5 8.7* 9.3  MG 1.9  --  2.0   Liver Function  Tests: Recent Labs  Lab 07/04/22 1559  AST 27  ALT 19  ALKPHOS 83  BILITOT 0.8  PROT 8.4*  ALBUMIN 4.5   CBG: No results for input(s): "GLUCAP" in the last 168 hours.  Discharge time spent: greater than 30 minutes.  Signed: Joycelyn Das, MD [facility] 07/06/2022

## 2022-07-06 NOTE — Plan of Care (Signed)
Problem: Education: Goal: Knowledge of disease or condition will improve 07/06/2022 1516 by Morene Rankins, RN Outcome: Adequate for Discharge 07/06/2022 0806 by Morene Rankins, RN Outcome: Progressing Goal: Knowledge of the prescribed therapeutic regimen will improve 07/06/2022 1516 by Morene Rankins, RN Outcome: Adequate for Discharge 07/06/2022 0806 by Morene Rankins, RN Outcome: Progressing Goal: Individualized Educational Video(s) 07/06/2022 1516 by Morene Rankins, RN Outcome: Adequate for Discharge 07/06/2022 0806 by Morene Rankins, RN Outcome: Progressing   Problem: Activity: Goal: Ability to tolerate increased activity will improve 07/06/2022 1516 by Morene Rankins, RN Outcome: Adequate for Discharge 07/06/2022 0806 by Morene Rankins, RN Outcome: Progressing Goal: Will verbalize the importance of balancing activity with adequate rest periods 07/06/2022 1516 by Morene Rankins, RN Outcome: Adequate for Discharge 07/06/2022 0806 by Morene Rankins, RN Outcome: Progressing   Problem: Respiratory: Goal: Ability to maintain a clear airway will improve 07/06/2022 1516 by Morene Rankins, RN Outcome: Adequate for Discharge 07/06/2022 0806 by Morene Rankins, RN Outcome: Progressing Goal: Levels of oxygenation will improve 07/06/2022 1516 by Morene Rankins, RN Outcome: Adequate for Discharge 07/06/2022 0806 by Morene Rankins, RN Outcome: Progressing Goal: Ability to maintain adequate ventilation will improve 07/06/2022 1516 by Morene Rankins, RN Outcome: Adequate for Discharge 07/06/2022 0806 by Morene Rankins, RN Outcome: Progressing   Problem: Education: Goal: Knowledge of General Education information will improve Description: Including pain rating scale, medication(s)/side effects and non-pharmacologic comfort measures 07/06/2022 1516 by Morene Rankins, RN Outcome: Adequate for Discharge 07/06/2022 0806 by Morene Rankins, RN Outcome:  Progressing   Problem: Health Behavior/Discharge Planning: Goal: Ability to manage health-related needs will improve 07/06/2022 1516 by Morene Rankins, RN Outcome: Adequate for Discharge 07/06/2022 0806 by Morene Rankins, RN Outcome: Progressing   Problem: Clinical Measurements: Goal: Ability to maintain clinical measurements within normal limits will improve 07/06/2022 1516 by Morene Rankins, RN Outcome: Adequate for Discharge 07/06/2022 0806 by Morene Rankins, RN Outcome: Progressing Goal: Will remain free from infection 07/06/2022 1516 by Morene Rankins, RN Outcome: Adequate for Discharge 07/06/2022 0806 by Morene Rankins, RN Outcome: Progressing Goal: Diagnostic test results will improve 07/06/2022 1516 by Morene Rankins, RN Outcome: Adequate for Discharge 07/06/2022 0806 by Morene Rankins, RN Outcome: Progressing Goal: Respiratory complications will improve 07/06/2022 1516 by Morene Rankins, RN Outcome: Adequate for Discharge 07/06/2022 0806 by Morene Rankins, RN Outcome: Progressing Goal: Cardiovascular complication will be avoided 07/06/2022 1516 by Morene Rankins, RN Outcome: Adequate for Discharge 07/06/2022 0806 by Morene Rankins, RN Outcome: Progressing   Problem: Activity: Goal: Risk for activity intolerance will decrease 07/06/2022 1516 by Morene Rankins, RN Outcome: Adequate for Discharge 07/06/2022 0806 by Morene Rankins, RN Outcome: Progressing   Problem: Nutrition: Goal: Adequate nutrition will be maintained 07/06/2022 1516 by Morene Rankins, RN Outcome: Adequate for Discharge 07/06/2022 0806 by Morene Rankins, RN Outcome: Progressing   Problem: Coping: Goal: Level of anxiety will decrease 07/06/2022 1516 by Morene Rankins, RN Outcome: Adequate for Discharge 07/06/2022 0806 by Morene Rankins, RN Outcome: Progressing   Problem: Elimination: Goal: Will not experience complications related to bowel motility 07/06/2022 1516 by  Morene Rankins, RN Outcome: Adequate for Discharge 07/06/2022 0806 by Morene Rankins, RN Outcome: Progressing Goal: Will not experience complications related to urinary retention 07/06/2022 1516 by Morene Rankins, RN Outcome: Adequate for Discharge 07/06/2022 0806 by Gayleen Orem,  Remi Deter, RN Outcome: Progressing   Problem: Pain Managment: Goal: General experience of comfort will improve 07/06/2022 1516 by Morene Rankins, RN Outcome: Adequate for Discharge 07/06/2022 0806 by Morene Rankins, RN Outcome: Progressing   Problem: Safety: Goal: Ability to remain free from injury will improve 07/06/2022 1516 by Morene Rankins, RN Outcome: Adequate for Discharge 07/06/2022 0806 by Morene Rankins, RN Outcome: Progressing   Problem: Skin Integrity: Goal: Risk for impaired skin integrity will decrease 07/06/2022 1516 by Morene Rankins, RN Outcome: Adequate for Discharge 07/06/2022 0806 by Morene Rankins, RN Outcome: Progressing

## 2022-07-10 LAB — CULTURE, BLOOD (ROUTINE X 2)
Culture: NO GROWTH
Culture: NO GROWTH
Special Requests: ADEQUATE

## 2022-07-17 ENCOUNTER — Other Ambulatory Visit: Payer: Self-pay | Admitting: Family Medicine

## 2022-07-17 DIAGNOSIS — E041 Nontoxic single thyroid nodule: Secondary | ICD-10-CM

## 2022-07-23 ENCOUNTER — Ambulatory Visit
Admission: RE | Admit: 2022-07-23 | Discharge: 2022-07-23 | Disposition: A | Discharge status: Home / Self Care | Payer: Medicare Other | Source: Ambulatory Visit | Attending: Family Medicine | Admitting: Family Medicine

## 2022-07-23 DIAGNOSIS — E041 Nontoxic single thyroid nodule: Secondary | ICD-10-CM | POA: Insufficient documentation

## 2022-07-26 NOTE — ED Provider Notes (Signed)
[facility] EMERGENCY DEPARTMENT Provider Note  CSN: [id number] Arrival date & time: 07/03/22 1929Chief Complaint(s)  Rash and Weakness   HPI  Nicole Wyatt is a 56 y.o. female with PMH COPD, sarcoidosis, HTN, HLD, seizure disorder who presents emergency department for evaluation of a rash and weakness. Patient states that she saw her PCP after [place] who gave her nystatin powder but her rash is not improving. She states that her rash is primarily under the breast folds and over the folds of the abdominal wall but has spread to her arms and over her face. She states that they are mildly itchy. She endorses some mild weakness and believes that she is having a sarcoid flareup. She is currently not endorsing any shortness of breath, chest pain, headache, fever or other systemic symptoms. Patient does wear oxygen intermittently at home but is not hypoxic on room air here in the emergency department.  Past Medical History      Past Medical History:  Diagnosis Date   Abnormal Pap smear of cervix    Anxiety    Arthritis    Bipolar 1 disorder (HCC)    COPD (chronic obstructive pulmonary disease) (HCC)    Depression    Dyspnea    with exertion    Dysrhythmia    History of kidney stones    Hyperlipidemia    Hypertension    Oxygen dependent    patient uses 2L oz PRN ; reports hardly ever uses it unless very SOB    [facility] spotted fever 2018   Sarcoidosis of lung (HCC)    managed by Dr Lindie Spruce    Seizures [facility]) last seizure 05/2013   Sleep apnea    uses CPAP nightly    UTI (urinary tract infection) 11/13/2018   see ED visit in epic ; was dc'd with oral abx and reports completed and relief of sx        Patient Active Problem List   Diagnosis Date Noted   Kidney stone 01/05/2020   Atopic dermatitis 09/08/2019   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/08/2019   Acute cystitis with hematuria 09/08/2019   Other fatigue 07/19/2019   Hypoglycemia 07/19/2019    Arm weakness 03/07/2019   Influenza A 02/24/2019   Acute upper respiratory infection 02/24/2019   Chronic obstructive pulmonary disease (HCC) 02/24/2019   Acute otitis externa of both ears 01/06/2019   Mild intermittent asthma without complication 01/06/2019   Primary osteoarthritis of hip 12/02/2018   Primary osteoarthritis of left hip 10/13/2018   OSA (obstructive sleep apnea) 10/13/2018   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   [place] spotted fever 07/02/2018   Urinary tract infection without hematuria 07/02/2018   Dysuria 06/23/2018   Depression, major, recurrent, moderate (HCC) 03/26/2018   Generalized edema 03/26/2018   Anaphylactic syndrome 03/26/2018   Cutaneous candidiasis 03/26/2018   Bursitis of hip 02/03/2018   Osteoarthritis of knee 02/03/2018   Chronic pulmonary hypertension (HCC) 06/18/2016   S/P cardiac cath 06/18/2016   Unstable angina (HCC) 05/24/2016   Bilateral leg edema 05/22/2016   Shortness of breath 04/30/2016   Skin lesion of right arm 03/01/2016   Screening for breast cancer 06/10/2015   Skin lesion of breast 06/10/2015   Hx of seizure disorder 06/24/2014   Left-sided weakness 06/24/2014   Tobacco abuse 06/24/2014   Home Medication(s)         Prior to Admission medications   Medication Sig Start Date End Date Taking? Authorizing Provider  albuterol (  VENTOLIN HFA) 108 (90 Base) MCG/ACT inhaler Inhale 2 puffs into the lungs every 4 (four) hours as needed for wheezing or shortness of breath. 04/17/21   Irean Hong, MD  Alum & Mag Hydroxide-Simeth (GI COCKTAIL) SUSP suspension Shake well.  Each dose to containe 8mL maalox and 77mL viscous lidocaine 01/11/21   Minna Antis, MD  amLODipine (NORVASC) 10 MG tablet Take 10 mg by mouth daily. 10/10/20   [provider]  aspirin EC 81 MG tablet Take 1 tablet (81 mg total) by mouth 2 (two) times daily. For DVT prophylaxis for 30 days after surgery.  Patient taking differently: Take 81 mg by mouth  daily. 12/02/18   Martensen, Lucretia Kern III, PA-C  azithromycin (ZITHROMAX Z-PAK) 250 MG tablet Take 2 tablets (500 mg) on Day 1, followed by 1 tablet (250 mg) once daily on Days 2 through 5. 09/21/21   Cuthriell, Delorise Royals, PA-C  budesonide-formoterol (SYMBICORT) 160-4.5 MCG/ACT inhaler Inhale 2 puffs into the lungs 2 (two) times daily. 08/18/20   Coralyn Helling, MD  EPINEPHrine 0.3 mg/0.3 mL IJ SOAJ injection Inject 0.3 mg into the muscle as needed for anaphylaxis.    [provider]  fluticasone (FLONASE) 50 MCG/ACT nasal spray Place 2 sprays into both nostrils daily.  Patient taking differently: Place 2 sprays into both nostrils daily as needed for allergies. 02/16/19   Fisher, Roselyn Bering, PA-C  furosemide (LASIX) 40 MG tablet Take 1 tablet (40 mg total) by mouth daily as needed for fluid.  Patient taking differently: Take 40 mg by mouth daily as needed for fluid or edema. 01/06/19   Carlean Jews, NP  hydrochlorothiazide (MICROZIDE) 12.5 MG capsule Take 12.5 mg by mouth daily. 09/02/20   [provider]  meloxicam (MOBIC) 15 MG tablet Take 1 tablet (15 mg total) by mouth daily. 09/21/21   Cuthriell, Delorise Royals, PA-C  methylPREDNISolone (MEDROL DOSEPAK) 4 MG TBPK tablet Take as prescribed 07/03/22   Alara Daniel, MD  omeprazole (PRILOSEC) 40 MG capsule TAKE (1) CAPSULE TWICE DAILY BEFORE MEALS. 04/05/22   Toney Reil, MD  ondansetron (ZOFRAN) 4 MG tablet Take 4 mg by mouth every 8 (eight) hours as needed. 04/19/21   [provider]  ondansetron (ZOFRAN-ODT) 4 MG disintegrating tablet Take 1 tablet (4 mg total) by mouth every 8 (eight) hours as needed for nausea or vomiting. 09/21/21   Cuthriell, Delorise Royals, PA-C  oxyCODONE-acetaminophen (PERCOCET) 5-325 MG tablet Take 1 tablet by mouth every 4 (four) hours as needed for severe pain. 12/18/21 12/18/22  Willy Eddy, MD  SPIRIVA HANDIHALER 18 MCG inhalation capsule 1 capsule daily. 09/14/20   [provider]  venlafaxine (EFFEXOR) 75 MG tablet Take by mouth. 08/03/20   [provider]   Past Surgical History       Past Surgical History:  Procedure Laterality Date   ABDOMINAL HYSTERECTOMY  1999   BLADDER SURGERY     CARDIAC CATHETERIZATION Bilateral 05/29/2016   Procedure: Right/Left Heart Cath and Coronary Angiography; Surgeon: Lamar Blinks, MD; Location: [facility] INVASIVE CV LAB; Service: Cardiovascular; Laterality: Bilateral;   COLONOSCOPY     COLONOSCOPY N/A 07/19/2015   Procedure: COLONOSCOPY; Surgeon: Earline Mayotte, MD; [facility]; Service: Endoscopy; Laterality: N/A;   CYSTOSCOPY WITH STENT PLACEMENT Left 01/05/2020   Procedure: CYSTOSCOPY WITH STENT PLACEMENT; Surgeon: Riki Altes, MD; Location: [facility] ORS; Service: Urology; Laterality: Left;   CYSTOSCOPY/URETEROSCOPY/HOLMIUM LASER/STENT PLACEMENT Left 01/26/2020   Procedure: CYSTOSCOPY/URETEROSCOPY/HOLMIUM LASER/STENT Exchange; Surgeon: Irineo Axon  C, MD; Location: [facility] ORS; Service: Urology; Laterality: Left;   ESOPHAGOGASTRODUODENOSCOPY (EGD) WITH PROPOFOL N/A 03/01/2021   Procedure: ESOPHAGOGASTRODUODENOSCOPY (EGD) WITH PROPOFOL; Surgeon: Toney ReilVanga, Rohini Reddy, MD; [facility]; Service: Gastroenterology; Laterality: N/A;   ESOPHAGOGASTRODUODENOSCOPY (EGD) WITH PROPOFOL N/A 07/03/2021   Procedure: ESOPHAGOGASTRODUODENOSCOPY (EGD) WITH PROPOFOL; Surgeon: Wyline MoodAnna, Kiran, MD; [facility]; Service: Gastroenterology; Laterality: N/A;   FLEXIBLE BRONCHOSCOPY N/A 07/27/2016   Procedure: FLEXIBLE BRONCHOSCOPY; Surgeon: Yevonne PaxSaadat A Khan, MD; Location: [facility] ORS; Service: Pulmonary; Laterality: N/A;   FOOT SURGERY     JOINT REPLACEMENT Left    hip   KIDNEY STONE SURGERY  7 years   TOTAL HIP ARTHROPLASTY Left 12/02/2018   Procedure: TOTAL HIP ARTHROPLASTY ANTERIOR APPROACH; Surgeon: Sheral ApleyMurphy, Timothy D, MD; Location: [facility] ORS; Service: Orthopedics; Laterality: Left;   TUBAL LIGATION     Family History        Family History  Problem Relation Age of Onset   Hypertension Mother    Arthritis Mother    Heart disease Mother    Heart failure Mother    Hypertension Father    Arthritis Father    Prostate cancer Father    Heart disease Father    Heart attack Father    Arrhythmia Father [id number]   pacemaker implant   Ovarian cancer Maternal Grandmother    Social History  Social History        Tobacco Use   Smoking status: Every Day    Packs/day: 0.50    Years: 14.00    Total pack years: 7.00    Types: Cigarettes   Smokeless tobacco: Never  Vaping Use   Vaping Use: Never used  Substance Use Topics   Alcohol use: Not Currently    Alcohol/week: 2.0 standard drinks of alcohol    Types: 2 Glasses of wine per week   Drug use: No   Allergies  Bee venom, Percocet [oxycodone-acetaminophen], Ciprofloxacin, and Penicillins  Review of Systems  Review of Systems  Skin: Positive for rash.   Physical Exam  Vital Signs  I have reviewed the triage vital signs  BP 120/67 (BP Location: Left Arm)  Pulse 62  Temp 97.6 F (36.4 C) (Oral)  Resp (!) 28  SpO2 98%  Physical Exam  Vitals and nursing note reviewed.  Constitutional:  General: She is not in acute distress.  Appearance: She is well-developed.  HENT:  Head: Normocephalic and atraumatic.  Eyes:  Conjunctiva/sclera: Conjunctivae normal.  Cardiovascular:  Rate and Rhythm: Normal rate and regular rhythm.  Heart sounds: No murmur heard.  Pulmonary:  Effort: Pulmonary effort is normal. No respiratory distress.  Breath sounds: Normal breath sounds.  Abdominal:  Palpations: Abdomen is soft.  Tenderness: There is no abdominal tenderness.  Musculoskeletal:  General: No swelling.  Cervical back: Neck supple.  Skin:  General: Skin is warm and dry.  Capillary Refill: Capillary refill takes less than 2 seconds.  Findings: Rash present.  Neurological:  Mental Status: She is alert.  Psychiatric:  Mood and Affect: Mood normal.   ED  Results and Treatments  Labs  (all labs ordered are listed, but only abnormal results are displayed)  Labs Reviewed  CBC WITH DIFFERENTIAL/PLATELET  COMPREHENSIVE METABOLIC PANEL  URINALYSIS, ROUTINE W REFLEX MICROSCOPIC  TROPONIN I (HIGH SENSITIVITY)   Radiology  No results found.  Pertinent labs & imaging results that were available during my care of the patient were reviewed by me and considered in my medical decision making (see MDM for details).  Medications Ordered in  ED  Medications - No data to display  Procedures  Procedures  (including critical care time)  Medical Decision Making / ED Course  This patient presents to the ED for concern of rash, this involves an extensive number of treatment options, and is a complaint that carries with it a high risk of complications and morbidity. The differential diagnosis includes cutaneous sarcoid, tinea, intertrigal Candida infection, contact dermatitis, urticaria  MDM:  Patient seen emergency room for evaluation of a rash. Physical exam with an erythematous pruritic rash worst under the breast folds bilaterally and over the abdominal folds with small nodules over the hands, arms and into the face. Pulmonary exam is unremarkable and patient with no increased work of breathing. Patient appears to been trying topical nystatin powder without success and given the intertrigal nature of the rash, we will trial clotrimazole and steroid taper to cover tinea infection as well as a sarcoid flareup. She has no chest pain or shortness of breath and at this time is not requiring any additional oxygen and thus additional imaging or laboratory evaluation was not pursued. Patient states that she will follow-up with a primary care physician after obtaining her steroid burst in the morning.  Additional history obtained:  -Additional history obtained from daughter  -External records from outside source obtained and reviewed including: Chart review including  previous notes, labs, imaging, consultation notes  Lab Tests:  -I ordered, reviewed, and interpreted labs.  The pertinent results include:  Labs Reviewed  CBC WITH DIFFERENTIAL/PLATELET  COMPREHENSIVE METABOLIC PANEL  URINALYSIS, ROUTINE W REFLEX MICROSCOPIC  TROPONIN I (HIGH SENSITIVITY)   Medicines ordered and prescription drug management:  No orders of the defined types were placed in this encounter.   -I have reviewed the patients home medicines and have made adjustments as needed  Critical interventions  none  Cardiac Monitoring:  The patient was maintained on a cardiac monitor. I personally viewed and interpreted the cardiac monitored which showed an underlying rhythm of: NSR  Social Determinants of Health:  Factors impacting patients care include: none  Reevaluation:  After the interventions noted above, I reevaluated the patient and found that they have :stayed the same  Co morbidities that complicate the patient evaluation        Past Medical History:  Diagnosis Date   Abnormal Pap smear of cervix    Anxiety    Arthritis    Bipolar 1 disorder (HCC)    COPD (chronic obstructive pulmonary disease) (HCC)    Depression    Dyspnea    with exertion    Dysrhythmia    History of kidney stones    Hyperlipidemia    Hypertension    Oxygen dependent    patient uses 2L oz PRN ; reports hardly ever uses it unless very SOB    [facility] spotted fever 2018   Sarcoidosis of lung (HCC)    managed by Dr Lindie Spruce    Seizures [facility]) last seizure 05/2013   Sleep apnea    uses CPAP nightly    UTI (urinary tract infection) 11/13/2018   see ED visit in epic ; was dc'd with oral abx and reports completed and relief of sx    Dispostion:  I considered admission for this patient, she currently does not meet inpatient criteria for admission, is safe for discharge with outpatient follow-up with clotrimazole that was delivered to bedside and steriod taper  Final Clinical Impression(s) /  ED Diagnoses  Final diagnoses:  None   @PCDICTATION @  Glendora Score, MD  07/04/22 1616    Glendora Score, MD 07/26/22 [id number]

## 2022-08-23 ENCOUNTER — Other Ambulatory Visit: Payer: Self-pay | Admitting: Otolaryngology

## 2022-08-23 DIAGNOSIS — E041 Nontoxic single thyroid nodule: Secondary | ICD-10-CM

## 2022-09-05 ENCOUNTER — Ambulatory Visit: Payer: Medicare Other

## 2022-09-17 ENCOUNTER — Ambulatory Visit
Admission: RE | Admit: 2022-09-17 | Discharge: 2022-09-17 | Disposition: A | Discharge status: Home / Self Care | Payer: Medicare Other | Source: Ambulatory Visit | Attending: Otolaryngology | Admitting: Otolaryngology

## 2022-09-17 DIAGNOSIS — E041 Nontoxic single thyroid nodule: Secondary | ICD-10-CM

## 2022-09-17 NOTE — Discharge Instructions (Signed)
Post Operative Instructions for Thyroid Biopsy  Keep pressure bandage over site of biopsy for 3-4 hours after leaving office.  Normal activity is permitted as long as it does not involve anything strenuous (i.e., jogging, heavy lifting, etc.).  Some minor pain may be experienced when the local anesthesia wears off, and should be relieved with Tylenol, Advil, or ibuprofen.  You may experience some bruising and/or swelling at the site of the biopsy.  If you should develop severe pain, difficulty swallowing or difficulty breathing, please call the office or [facility] at [phone].  Apply ice pack for 20 minutes this evening.      

## 2022-09-18 ENCOUNTER — Ambulatory Visit: Payer: Medicare Other | Admitting: Cardiology

## 2022-09-18 LAB — CYTOLOGY - NON PAP

## 2022-10-03 ENCOUNTER — Other Ambulatory Visit: Payer: Self-pay | Admitting: Family Medicine

## 2022-10-03 DIAGNOSIS — Z1231 Encounter for screening mammogram for malignant neoplasm of breast: Secondary | ICD-10-CM

## 2022-11-05 ENCOUNTER — Ambulatory Visit: Payer: Medicare Other | Attending: Cardiology | Admitting: Cardiology

## 2022-11-05 ENCOUNTER — Encounter: Payer: Self-pay | Admitting: Cardiology

## 2023-02-28 ENCOUNTER — Other Ambulatory Visit: Payer: Self-pay

## 2023-02-28 ENCOUNTER — Emergency Department: Payer: 59

## 2023-02-28 ENCOUNTER — Emergency Department
Admission: EM | Admit: 2023-02-28 | Discharge: 2023-02-28 | Disposition: A | Discharge status: Home / Self Care | Payer: 59 | Attending: Emergency Medicine | Admitting: Emergency Medicine

## 2023-02-28 ENCOUNTER — Encounter: Payer: Self-pay | Admitting: Emergency Medicine

## 2023-02-28 DIAGNOSIS — N390 Urinary tract infection, site not specified: Secondary | ICD-10-CM | POA: Insufficient documentation

## 2023-02-28 DIAGNOSIS — R1032 Left lower quadrant pain: Secondary | ICD-10-CM | POA: Diagnosis present

## 2023-02-28 DIAGNOSIS — J449 Chronic obstructive pulmonary disease, unspecified: Secondary | ICD-10-CM | POA: Insufficient documentation

## 2023-02-28 DIAGNOSIS — I10 Essential (primary) hypertension: Secondary | ICD-10-CM | POA: Diagnosis not present

## 2023-02-28 LAB — BASIC METABOLIC PANEL
Anion gap: 9 (ref 5–15)
BUN: 11 mg/dL (ref 6–20)
CO2: 25 mmol/L (ref 22–32)
Calcium: 8.9 mg/dL (ref 8.9–10.3)
Chloride: 104 mmol/L (ref 98–111)
Creatinine, Ser: 0.84 mg/dL (ref 0.44–1.00)
GFR, Estimated: >60 mL/min (ref >60)
Glucose, Bld: 115 mg/dL — ABNORMAL HIGH (ref 70–99)
Potassium: 3.6 mmol/L (ref 3.5–5.1)
Sodium: 138 mmol/L (ref 135–145)

## 2023-02-28 LAB — CBC
HCT: 45.5 % (ref 36.0–46.0)
Hemoglobin: 15 g/dL (ref 12.0–15.0)
MCH: 31.8 pg (ref 26.0–34.0)
MCHC: 33 g/dL (ref 30.0–36.0)
MCV: 96.6 fL (ref 80.0–100.0)
Platelets: 296 10*3/uL (ref 150–400)
RBC: 4.71 MIL/uL (ref 3.87–5.11)
RDW: 14.4 % (ref 11.5–15.5)
WBC: 5.8 10*3/uL (ref 4.0–10.5)
nRBC: 0 % (ref 0.0–0.2)

## 2023-02-28 LAB — URINALYSIS, ROUTINE W REFLEX MICROSCOPIC
Bilirubin Urine: NEGATIVE
Glucose, UA: NEGATIVE mg/dL
Ketones, ur: NEGATIVE mg/dL
Nitrite: NEGATIVE
Protein, ur: NEGATIVE mg/dL
Specific Gravity, Urine: 1.01 (ref 1.005–1.030)
pH: 6 (ref 5.0–8.0)

## 2023-02-28 MED ORDER — KETOROLAC TROMETHAMINE 15 MG/ML IJ SOLN
15.0000 mg | Freq: Once | INTRAMUSCULAR | Status: AC
  Administered 2023-02-28: 15 mg via INTRAVENOUS
  Filled 2023-02-28: qty 1

## 2023-02-28 MED ORDER — CEPHALEXIN 500 MG PO CAPS
500.0000 mg | ORAL_CAPSULE | Freq: Once | ORAL | Status: AC
  Administered 2023-02-28: 500 mg via ORAL
  Filled 2023-02-28: qty 1

## 2023-02-28 MED ORDER — IBUPROFEN 600 MG PO TABS: 600.0000 mg | ORAL_TABLET | Freq: Four times a day (QID) | ORAL | 0 refills | Status: DC | PRN

## 2023-02-28 MED ORDER — ONDANSETRON 4 MG PO TBDP
4.0000 mg | ORAL_TABLET | Freq: Once | ORAL | Status: AC | PRN
Start: 2023-02-28 — End: 2023-02-28
  Administered 2023-02-28: 4 mg via ORAL
  Filled 2023-02-28: qty 1

## 2023-02-28 MED ORDER — ONDANSETRON 4 MG PO TBDP
4.0000 mg | ORAL_TABLET | Freq: Three times a day (TID) | ORAL | 0 refills | Status: DC | PRN
Start: 2023-02-28 — End: 2024-07-16

## 2023-02-28 MED ORDER — SODIUM CHLORIDE 0.9 % IV BOLUS
1000.0000 mL | Freq: Once | INTRAVENOUS | Status: AC
  Administered 2023-02-28: 1000 mL via INTRAVENOUS

## 2023-02-28 MED ORDER — CEPHALEXIN 500 MG PO CAPS: 500.0000 mg | ORAL_CAPSULE | Freq: Two times a day (BID) | ORAL | 0 refills | Status: AC

## 2023-02-28 MED ORDER — ONDANSETRON HCL 4 MG/2ML IJ SOLN
4.0000 mg | Freq: Once | INTRAMUSCULAR | Status: AC
  Administered 2023-02-28: 4 mg via INTRAVENOUS
  Filled 2023-02-28: qty 2

## 2023-02-28 NOTE — ED Provider Notes (Signed)
[facility] Provider Note    Event Date/Time   First MD Initiated Contact with Patient 02/28/23 1451     (approximate)   History   Abdominal Pain   HPI  Nicole Wyatt is a 57 y.o. female with a history of COPD, hypertension, seizure disorder, dyslipidemia, bipolar disorder, depression who presents with left flank and left lower quadrant abdominal pain for the last week, persistent course, radiating around from the left lower back.  She reports associated nausea but no vomiting.  She has no diarrhea or blood in the stool.  She denies any fever or chills.  The patient states that she has somewhat decreased urination but no dysuria or frequency.  She states the pain feels similar to when she had a kidney stone before.  I reviewed the past medical records.  The patient was most recently admitted in July of last year.  Per the hospitalist discharge summary from 7/14 she presented with cough and shortness of breath and was treated for COPD exacerbation.   Physical Exam   Triage Vital Signs: ED Triage Vitals [02/28/23 1326]  Enc Vitals Group     BP 129/81     Pulse Rate 89     Resp 17     Temp 98.1 F (36.7 C)     Temp Source Oral     SpO2 98 %     Weight 247 lb (112 kg)     Height      Head Circumference      Peak Flow      Pain Score 10     Pain Loc      Pain Edu?      Excl. in [place]?     Most recent vital signs: Vitals:   02/28/23 1326 02/28/23 1746  BP: 129/81 124/75  Pulse: 89 72  Resp: 17 18  Temp: 98.1 F (36.7 C)   SpO2: 98% 98%     General: Awake, no distress.  CV:  Good peripheral perfusion.  Resp:  Normal effort.  Abd:  Soft with mild left lower quadrant tenderness.  No distention.  Other:  No jaundice or scleral icterus.  No midline spinal tenderness.  No CVA tenderness.  No rash or skin lesion.   ED Results / Procedures / Treatments   Labs (all labs ordered are listed, but only abnormal results are displayed) Labs  Reviewed  URINALYSIS, ROUTINE W REFLEX MICROSCOPIC - Abnormal; Notable for the following components:      Result Value   Color, Urine YELLOW (*)    APPearance HAZY (*)    Hgb urine dipstick SMALL (*)    Leukocytes,Ua LARGE (*)    Bacteria, UA RARE (*)    All other components within normal limits  BASIC METABOLIC PANEL - Abnormal; Notable for the following components:   Glucose, Bld 115 (*)    All other components within normal limits  CBC     EKG     RADIOLOGY  CT abdomen/pelvis: I independently viewed and interpreted the images.  There is no ureteral stone or hydronephrosis.  Radiology impression is as follows:  IMPRESSION:  No obstructing renal stone.    Scattered stool. No bowel obstruction, free air or free fluid.  Normal appendix.    Small hiatal hernia.    Significant streak artifact from the patient's left hip  arthroplasty.    Bilateral adrenal adenomas   PROCEDURES:  Critical Care performed: No  Procedures   MEDICATIONS ORDERED IN  ED: Medications  ondansetron (ZOFRAN-ODT) disintegrating tablet 4 mg (4 mg Oral Given 02/28/23 1335)  ketorolac (TORADOL) 15 MG/ML injection 15 mg (15 mg Intravenous Given 02/28/23 1555)  sodium chloride 0.9 % bolus 1,000 mL (0 mLs Intravenous Stopped 02/28/23 1741)  ondansetron (ZOFRAN) injection 4 mg (4 mg Intravenous Given 02/28/23 1555)  cephALEXin (KEFLEX) capsule 500 mg (500 mg Oral Given 02/28/23 1741)     IMPRESSION / MDM / ASSESSMENT AND PLAN / ED COURSE  I reviewed the triage vital signs and the nursing notes.  57 year old female with PMH as noted above presents with 1 week of left flank/left lower quadrant abdominal pain with some nausea but no other significant associated symptoms.  Exam reveals normal vital signs and mild tenderness to the area.  Differential diagnosis includes, but is not limited to, ureteral stone, UTI/pyelonephritis, diverticulitis, colitis, musculoskeletal pain.  We will obtain CT, basic labs,  urinalysis, and reassess.  Patient's presentation is most consistent with acute complicated illness / injury requiring diagnostic workup.  ----------------------------------------- 5:37 PM on 02/28/2023 -----------------------------------------  CT is negative for acute findings.  Basic labs are unremarkable with no leukocytosis, normal creatinine, normal electrolytes.  Urinalysis does show large leukocyte esterase and significant WBCs, consistent with urinary tract infection.  Since the pain is mainly lower abdominal and the patient has no CVA tenderness, fever, or systemic symptoms, there is no clinical evidence for pyelonephritis.  I have prescribed Keflex for UTI/cystitis as well as ibuprofen and Zofran for symptomatic treatment.  The patient is stable for discharge home at this time.  I counseled her on the results of the workup and plan of care.  I gave strict return precautions and she expressed understanding.  FINAL CLINICAL IMPRESSION(S) / ED DIAGNOSES   Final diagnoses:  Urinary tract infection without hematuria, site unspecified     Rx / DC Orders   ED Discharge Orders          Ordered    cephALEXin (KEFLEX) 500 MG capsule  2 times daily        02/28/23 1733    ibuprofen (ADVIL) 600 MG tablet  Every 6 hours PRN        02/28/23 1733    ondansetron (ZOFRAN-ODT) 4 MG disintegrating tablet  Every 8 hours PRN        02/28/23 1733             Note:  This document was prepared using Dragon voice recognition software and may include unintentional dictation errors.    Arta Silence, MD 02/28/23 1902

## 2023-02-28 NOTE — ED Triage Notes (Signed)
Pt sts that she has been having left flank/abd pain. Pt sts that she has a hx of kidney stones and thinks that it is another one. Pt has been vomiting.

## 2023-02-28 NOTE — Discharge Instructions (Addendum)
Take the Keflex as prescribed and finish the full course.  You may take the ibuprofen as needed for pain and the Zofran as needed for nausea.  Follow-up with your regular doctor.  Return to the ER for new, worsening, or persistent severe pain, fever, vomiting, weakness, or any other new or worsening symptoms that concern you.

## 2023-08-10 ENCOUNTER — Emergency Department
Admission: EM | Admit: 2023-08-10 | Discharge: 2023-08-11 | Disposition: A | Discharge status: Home / Self Care | Payer: 59 | Source: Home / Self Care | Attending: Emergency Medicine | Admitting: Emergency Medicine

## 2023-08-10 ENCOUNTER — Emergency Department: Payer: 59

## 2023-08-10 ENCOUNTER — Other Ambulatory Visit: Payer: Self-pay

## 2023-08-10 DIAGNOSIS — R300 Dysuria: Secondary | ICD-10-CM

## 2023-08-10 DIAGNOSIS — I10 Essential (primary) hypertension: Secondary | ICD-10-CM | POA: Insufficient documentation

## 2023-08-10 DIAGNOSIS — R55 Syncope and collapse: Secondary | ICD-10-CM | POA: Diagnosis present

## 2023-08-10 DIAGNOSIS — J449 Chronic obstructive pulmonary disease, unspecified: Secondary | ICD-10-CM | POA: Insufficient documentation

## 2023-08-10 DIAGNOSIS — R109 Unspecified abdominal pain: Secondary | ICD-10-CM | POA: Diagnosis not present

## 2023-08-10 LAB — CBC
HCT: 48.5 % — ABNORMAL HIGH (ref 36.0–46.0)
Hemoglobin: 16.1 g/dL — ABNORMAL HIGH (ref 12.0–15.0)
MCH: 31.5 pg (ref 26.0–34.0)
MCHC: 33.2 g/dL (ref 30.0–36.0)
MCV: 94.9 fL (ref 80.0–100.0)
Platelets: 275 10*3/uL (ref 150–400)
RBC: 5.11 MIL/uL (ref 3.87–5.11)
RDW: 13.8 % (ref 11.5–15.5)
WBC: 5.3 10*3/uL (ref 4.0–10.5)
nRBC: 0 % (ref 0.0–0.2)

## 2023-08-10 LAB — URINALYSIS, COMPLETE (UACMP) WITH MICROSCOPIC
Bacteria, UA: NONE SEEN
Bilirubin Urine: NEGATIVE
Glucose, UA: NEGATIVE mg/dL
Ketones, ur: NEGATIVE mg/dL
Nitrite: NEGATIVE
Protein, ur: NEGATIVE mg/dL
Specific Gravity, Urine: 1.016 (ref 1.005–1.030)
pH: 5 (ref 5.0–8.0)

## 2023-08-10 LAB — BASIC METABOLIC PANEL
Anion gap: 12 (ref 5–15)
BUN: 14 mg/dL (ref 6–20)
CO2: 26 mmol/L (ref 22–32)
Calcium: 9.2 mg/dL (ref 8.9–10.3)
Chloride: 102 mmol/L (ref 98–111)
Creatinine, Ser: 0.72 mg/dL (ref 0.44–1.00)
GFR, Estimated: >60 mL/min (ref >60)
Glucose, Bld: 88 mg/dL (ref 70–99)
Potassium: 3.5 mmol/L (ref 3.5–5.1)
Sodium: 140 mmol/L (ref 135–145)

## 2023-08-10 LAB — TROPONIN I (HIGH SENSITIVITY)
Troponin I (High Sensitivity): 4 ng/L (ref <18)
Troponin I (High Sensitivity): 4 ng/L (ref <18)

## 2023-08-10 MED ORDER — OXYCODONE-ACETAMINOPHEN 5-325 MG PO TABS
1.0000 | ORAL_TABLET | Freq: Once | ORAL | Status: AC
  Administered 2023-08-10: 1 via ORAL
  Filled 2023-08-10: qty 1

## 2023-08-10 NOTE — ED Notes (Signed)
Pt taken to the room via wheelchair and attached to the monitor; primary RN made aware of the patients arrival.

## 2023-08-10 NOTE — ED Triage Notes (Signed)
Pt reports left lower abdominal pain for about a week. Since yesterday she has had left sided chest pain/palpitations, and dizziness. This evening she got dizzy and passed out and hit her (frontal) head on the wall or the door. Denies blood thinners.

## 2023-08-10 NOTE — ED Notes (Signed)
Provider at bedside at this time

## 2023-08-10 NOTE — ED Provider Notes (Signed)
-----------------------------------------   11:10 PM on 08/10/2023 -----------------------------------------  Assuming care from Dr. Lenard Lance.  In short, Nicole Wyatt is a 57 y.o. female with a chief complaint of pain and syncope.  Refer to the original H&P for additional details.  The current plan of care is to follow up UA.  Anticipate discharge.   Clinical Course as of 08/11/23 0005  Sun Aug 11, 2023  0002 Urinalysis equivocal with trace leukocytes and 6-10 WBCs, no bacteria.  I discussed with the patient and she said that she is having some dysuria and increased urinary frequency.  She would like to do empiric treatment of a possible UTI which I think is reasonable.  I also ordered a urine culture.  As per the recommendations from Dr. Lenard Lance, the patient will be discharged home and she is comfortable with that plan.  She is hemodynamically stable and appears appropriate for discharge.  I considered hospitalization given the nature of the history of present illness, but I agree with Dr. Awanda Mink evaluation and the patient is also comfortable going home and is essentially asymptomatic at this time other than occasional left-sided flank discomfort.  No sign of systemic illness including pyelonephritis. [CF]    Clinical Course User Index [CF] Nicole Rose, MD     Medications  sulfamethoxazole-trimethoprim (BACTRIM DS) 800-160 MG per tablet 1 tablet (has no administration in time range)  oxyCODONE-acetaminophen (PERCOCET/ROXICET) 5-325 MG per tablet 1 tablet (1 tablet Oral Given 08/10/23 2310)     ED Discharge Orders          Ordered    sulfamethoxazole-trimethoprim (BACTRIM DS) 800-160 MG tablet  2 times daily        08/11/23 0005           Final diagnoses:  Flank pain  Syncope, unspecified syncope type  Dysuria     Nicole Rose, MD 08/11/23 0005

## 2023-08-10 NOTE — ED Notes (Signed)
Patient given a glass of water per MD request.

## 2023-08-10 NOTE — ED Provider Notes (Signed)
[facility] Provider Note    Event Date/Time   First MD Initiated Contact with Patient 08/10/23 2138     (approximate)  History   Chief Complaint: Loss of Consciousness  HPI  ELLYETTE ENGBLOM is a 57 y.o. female with a past medical history anxiety, bipolar, COPD, hypertension, hyperlipidemia, presents the emergency department with left flank pain chest pain syncopal episode and head injury.  According to the patient for the past month or so she has been experiencing intermittent sharp pains left flank.  Patient states a history of kidney stones which this feels identical.  She states earlier today she was having more significant pain in her left flank as well as in her chest.  Patient states she became dizzy and nearly passed out or passed out hitting her head.  Patient states currently she feels back to normal.  States intermittent mild left-sided abdominal pains but states that has been ongoing for at least 1 month per patient.  Patient states she has had to have ureteral stents in the past for stone denies any fever denies any dysuria.  Physical Exam   Triage Vital Signs: ED Triage Vitals  Encounter Vitals Group     BP 08/10/23 1926 (!) 132/104     Systolic BP Percentile --      Diastolic BP Percentile --      Pulse Rate 08/10/23 1926 70     Resp 08/10/23 1926 16     Temp 08/10/23 1926 99 F (37.2 C)     Temp Source 08/10/23 1926 Oral     SpO2 08/10/23 1926 98 %     Weight --      Height --      Head Circumference --      Peak Flow --      Pain Score 08/10/23 1925 9     Pain Loc --      Pain Education --      Exclude from Growth Chart --     Most recent vital signs: Vitals:   08/10/23 1926 08/10/23 2132  BP: (!) 132/104 (!) 119/96  Pulse: 70 75  Resp: 16 17  Temp: 99 F (37.2 C)   SpO2: 98% 97%    General: Awake, no distress.  CV:  Good peripheral perfusion.  Regular rate and rhythm  Resp:  Normal effort.  Equal breath sounds  bilaterally.  Abd:  No distention.  Soft, nontender.  No rebound or guarding.  Benign abdomen.  ED Results / Procedures / Treatments   EKG  EKG viewed and interpreted by myself shows a sinus rhythm at 78 bpm with a slightly widened QRS, left axis deviation, largely normal intervals besides slight PR prolongation, nonspecific ST changes without ST elevation.  RADIOLOGY  I have reviewed interpret the chest x-ray images.  No consolidation.   Radiology has read the chest x-ray no acute finding.  CT scan of the head is negative for acute abnormality. CT scan of the C-spine is negative for acute abnormality. CT renal scan is negative for acute abnormality no obstructing stone or hydronephrosis.  MEDICATIONS ORDERED IN ED: Medications - No data to display   IMPRESSION / MDM / ASSESSMENT AND PLAN / ED COURSE  I reviewed the triage vital signs and the nursing notes.  Patient's presentation is most consistent with acute presentation with potential threat to life or bodily function.  Patient presents to the emergency department for a syncopal episode.  Patient states she has been  experiencing intermittent left flank pain/sharp pain over the past 1 month or so thought to be due to a kidney stone.  Patient states today she was also experiencing some chest pain and had a syncopal versus near syncopal episode.  Patient states she has no pain currently denies any chest pain no shortness of breath.  Patient states a history of kidney stones and believes that the left flank pain is likely a kidney stone.  Denies any urinary symptoms however.  Patient's workup in the emergency department is reassuring including a clear chest x-ray reassuring EKG negative CT imaging of the head spine and renal CT.  Patient states she is feeling much better.  I would still like to check a urine sample to ensure no TR pyelonephritis.  We will orally hydrate the patient and see if we can get a urine sample from her to test.   Patient agreeable to plan of care.  FINAL CLINICAL IMPRESSION(S) / ED DIAGNOSES   Syncope Left flank pain  Note:  This document was prepared using Dragon voice recognition software and may include unintentional dictation errors.   Minna Antis, MD 08/10/23 2257

## 2023-08-11 MED ORDER — SULFAMETHOXAZOLE-TRIMETHOPRIM 800-160 MG PO TABS
1.0000 | ORAL_TABLET | Freq: Once | ORAL | Status: AC
  Administered 2023-08-11: 1 via ORAL
  Filled 2023-08-11: qty 1

## 2023-08-11 MED ORDER — SULFAMETHOXAZOLE-TRIMETHOPRIM 800-160 MG PO TABS
1.0000 | ORAL_TABLET | Freq: Two times a day (BID) | ORAL | 0 refills | Status: AC
Start: 2023-08-11 — End: 2023-08-14

## 2023-08-11 NOTE — Discharge Instructions (Signed)
Please complete the prescribed course of antibiotics and follow up with your regular doctor at the next available opportunity.

## 2023-08-12 LAB — URINE CULTURE

## 2024-01-20 ENCOUNTER — Encounter: Payer: Self-pay | Admitting: Gastroenterology

## 2024-01-20 ENCOUNTER — Ambulatory Visit: Payer: 59 | Admitting: Gastroenterology

## 2024-01-20 ENCOUNTER — Other Ambulatory Visit: Payer: Self-pay

## 2024-01-20 ENCOUNTER — Telehealth: Payer: Self-pay

## 2024-01-20 VITALS — BP 124/80 | HR 92 | Temp 98.2°F | Ht 67.0 in | Wt 259.2 lb

## 2024-01-20 DIAGNOSIS — R1310 Dysphagia, unspecified: Secondary | ICD-10-CM

## 2024-01-20 DIAGNOSIS — R1319 Other dysphagia: Secondary | ICD-10-CM

## 2024-01-20 DIAGNOSIS — K2210 Ulcer of esophagus without bleeding: Secondary | ICD-10-CM

## 2024-01-20 DIAGNOSIS — Z8719 Personal history of other diseases of the digestive system: Secondary | ICD-10-CM | POA: Diagnosis not present

## 2024-01-20 NOTE — Progress Notes (Unsigned)
Arlyss Repress, MD [address]  [address]  [place], [place] [address]  Main: [phone]  Fax: [phone]    Gastroenterology Consultation  Referring Provider:     [facility] Primary Care Physician:  [facility], [facility] Primary Gastroenterologist:  Dr. Arlyss Repress Reason for Consultation: Difficulty swallowing, heartburn        HPI:   Nicole Wyatt is a 58 y.o. female referred by [facility], [facility] for consultation & management of difficulty swallowing, choking episodes and heartburn.  She has known history of erosive esophagitis based on EGD in 2022, taking omeprazole 40 mg twice daily.  Patient has gained about 40 to 50 pounds within last 2 years.  She attributes it to difficulty coping since passing away of her family members and has prolonged grief.  She denies any epigastric pain, nausea or vomiting She currently smokes tobacco  NSAIDs: Ibuprofen and meloxicam  Antiplts/Anticoagulants/Anti thrombotics: None  GI Procedures:  Upper endoscopy 07/03/2021 - Normal examined duodenum. - Normal stomach. - LA Grade C reflux esophagitis with no bleeding. Biopsied. DIAGNOSIS:  A. ESOPHAGUS; BIOPSIES:  - FINDINGS CONSISTENT WITH GASTROESOPHAGEAL REFLUX.  - NEGATIVE FOR INTESTINAL METAPLASIA, DYSPLASIA AND MALIGNANCY   EGD 03/01/2021 - Normal duodenal bulb and second portion of the duodenum. - Normal stomach. Biopsied. - Esophagogastric landmarks identified. - LA Grade C acute esophagitis with no bleeding.   DIAGNOSIS:  A. STOMACH, RANDOM; COLD BIOPSY:  - ANTRAL AND OXYNTIC MUCOSA WITH NO SIGNIFICANT PATHOLOGIC ALTERATION.  - NEGATIVE FOR ACTIVE INFLAMMATION AND H PYLORI.  - NEGATIVE FOR INTESTINAL METAPLASIA, DYSPLASIA, AND MALIGNANCY.    Colonoscopy 07/19/2015 - The entire examined colon is normal on direct and retroflexion views. - No specimens collected.  Past Medical History:  Diagnosis Date   Abnormal Pap smear of  cervix    Anxiety    Arthritis    Bipolar 1 disorder (HCC)    COPD (chronic obstructive pulmonary disease) (HCC)    Depression    Dyspnea    with exertion    Dysrhythmia    History of kidney stones    Hyperlipidemia    Hypertension    Oxygen dependent    patient uses 2L oz PRN ; reports hardly ever uses it unless very SOB     [facility] spotted fever 2018   Sarcoidosis of lung (HCC)    managed by Dr Lindie Spruce    Seizures [facility]) last seizure 05/2013   Sleep apnea    uses CPAP nightly    UTI (urinary tract infection) 11/13/2018   see ED visit in epic ; was dc'd with oral abx and reports completed and relief of sx     Past Surgical History:  Procedure Laterality Date   ABDOMINAL HYSTERECTOMY  1999   BLADDER SURGERY     CARDIAC CATHETERIZATION Bilateral 05/29/2016   Procedure: Right/Left Heart Cath and Coronary Angiography;  Surgeon: Lamar Blinks, MD;  Location: [facility] INVASIVE CV LAB;  Service: Cardiovascular;  Laterality: Bilateral;   COLONOSCOPY     COLONOSCOPY N/A 07/19/2015   Procedure: COLONOSCOPY;  Surgeon: Earline Mayotte, MD;  Location: [facility] ENDOSCOPY;  Service: Endoscopy;  Laterality: N/A;   CYSTOSCOPY WITH STENT PLACEMENT Left 01/05/2020   Procedure: CYSTOSCOPY WITH STENT PLACEMENT;  Surgeon: Riki Altes, MD;  Location: [facility] ORS;  Service: Urology;  Laterality: Left;   CYSTOSCOPY/URETEROSCOPY/HOLMIUM LASER/STENT PLACEMENT Left 01/26/2020   Procedure: CYSTOSCOPY/URETEROSCOPY/HOLMIUM LASER/STENT Exchange;  Surgeon: Riki Altes, MD;  Location:  [facility] ORS;  Service: Urology;  Laterality: Left;   ESOPHAGOGASTRODUODENOSCOPY (EGD) WITH PROPOFOL N/A 03/01/2021   Procedure: ESOPHAGOGASTRODUODENOSCOPY (EGD) WITH PROPOFOL;  Surgeon: Toney Reil, MD;  Location: [facility] ENDOSCOPY;  Service: Gastroenterology;  Laterality: N/A;   ESOPHAGOGASTRODUODENOSCOPY (EGD) WITH PROPOFOL N/A 07/03/2021   Procedure: ESOPHAGOGASTRODUODENOSCOPY (EGD) WITH PROPOFOL;  Surgeon: Wyline Mood, MD;   Location: [facility] ENDOSCOPY;  Service: Gastroenterology;  Laterality: N/A;   FLEXIBLE BRONCHOSCOPY N/A 07/27/2016   Procedure: FLEXIBLE BRONCHOSCOPY;  Surgeon: Yevonne Pax, MD;  Location: [facility] ORS;  Service: Pulmonary;  Laterality: N/A;   FOOT SURGERY     JOINT REPLACEMENT Left    hip   KIDNEY STONE SURGERY  7 years   TOTAL HIP ARTHROPLASTY Left 12/02/2018   Procedure: TOTAL HIP ARTHROPLASTY ANTERIOR APPROACH;  Surgeon: Sheral Apley, MD;  Location: [facility] ORS;  Service: Orthopedics;  Laterality: Left;   TUBAL LIGATION       Current Outpatient Medications:    albuterol (VENTOLIN HFA) 108 (90 Base) MCG/ACT inhaler, Inhale 2 puffs into the lungs every 4 (four) hours as needed for wheezing or shortness of breath., Disp: 18 g, Rfl: 0   amLODipine (NORVASC) 10 MG tablet, Take 10 mg by mouth daily., Disp: , Rfl:    aspirin EC 81 MG tablet, Take 1 tablet (81 mg total) by mouth 2 (two) times daily. For DVT prophylaxis for 30 days after surgery. (Patient taking differently: Take 81 mg by mouth daily.), Disp: 60 tablet, Rfl: 0   budesonide-formoterol (SYMBICORT) 160-4.5 MCG/ACT inhaler, Inhale 2 puffs into the lungs 2 (two) times daily., Disp: 1 each, Rfl: 6   EPINEPHrine 0.3 mg/0.3 mL IJ SOAJ injection, Inject 0.3 mg into the muscle as needed for anaphylaxis., Disp: , Rfl:    fluticasone (FLONASE) 50 MCG/ACT nasal spray, Place 2 sprays into both nostrils daily. (Patient taking differently: Place 2 sprays into both nostrils daily as needed for allergies.), Disp: 16 g, Rfl: 2   furosemide (LASIX) 40 MG tablet, Take 1 tablet (40 mg total) by mouth daily as needed for fluid. (Patient taking differently: Take 40 mg by mouth daily as needed for fluid or edema.), Disp: 30 tablet, Rfl: 5   ibuprofen (ADVIL) 600 MG tablet, Take 1 tablet (600 mg total) by mouth every 6 (six) hours as needed., Disp: 30 tablet, Rfl: 0   meloxicam (MOBIC) 15 MG tablet, Take 1 tablet (15 mg total) by mouth daily., Disp: 30 tablet, Rfl:  0   nystatin (MYCOSTATIN/NYSTOP) powder, Apply topically 4 (four) times daily., Disp: 15 g, Rfl: 0   omeprazole (PRILOSEC) 40 MG capsule, TAKE (1) CAPSULE TWICE DAILY BEFORE MEALS., Disp: 60 capsule, Rfl: 2   ondansetron (ZOFRAN-ODT) 4 MG disintegrating tablet, Take 1 tablet (4 mg total) by mouth every 8 (eight) hours as needed for nausea or vomiting., Disp: 12 tablet, Rfl: 0   SPIRIVA HANDIHALER 18 MCG inhalation capsule, Place 18 mcg into inhaler and inhale daily., Disp: , Rfl:    venlafaxine (EFFEXOR) 75 MG tablet, Take 225 mg by mouth daily., Disp: , Rfl:    Family History  Problem Relation Age of Onset   Hypertension Mother    Arthritis Mother    Heart disease Mother    Heart failure Mother    Hypertension Father    Arthritis Father    Prostate cancer Father    Heart disease Father    Heart attack Father    Arrhythmia Father 65       pacemaker implant  Ovarian cancer Maternal Grandmother      Social History   Tobacco Use   Smoking status: Every Day    Current packs/day: 0.50    Average packs/day: 0.5 packs/day for 14.0 years (7.0 ttl pk-yrs)    Types: Cigarettes   Smokeless tobacco: Never  Vaping Use   Vaping status: Never Used  Substance Use Topics   Alcohol use: Not Currently    Alcohol/week: 2.0 standard drinks of alcohol    Types: 2 Glasses of wine per week   Drug use: No    Allergies as of 01/20/2024 - Review Complete 01/20/2024  Allergen Reaction Noted   Bee venom Anaphylaxis 05/29/2016   Percocet [oxycodone-acetaminophen] Nausea And Vomiting 12/22/2021   Ciprofloxacin Nausea Only 06/23/2018   Penicillins Diarrhea and Rash 07/26/2016    Review of Systems:    All systems reviewed and negative except where noted in HPI.   Physical Exam:  BP 124/80 (BP Location: Left Arm, Patient Position: Sitting, Cuff Size: Large)   Pulse 92   Temp 98.2 F (36.8 C) (Oral)   Ht 5\' 7"  (1.702 m)   Wt 259 lb 4 oz (117.6 kg)   BMI 40.60 kg/m  No LMP recorded. Patient  has had a hysterectomy.  General:   Alert,  Well-developed, well-nourished, pleasant and cooperative in NAD Head:  Normocephalic and atraumatic. Eyes:  Sclera clear, no icterus.   Conjunctiva pink. Ears:  Normal auditory acuity. Nose:  No deformity, discharge, or lesions. Mouth:  No deformity or lesions,oropharynx pink & moist. Neck:  Supple; no masses or thyromegaly. Lungs:  Respirations even and unlabored.  Clear throughout to auscultation.   No wheezes, crackles, or rhonchi. No acute distress. Heart:  Regular rate and rhythm; no murmurs, clicks, rubs, or gallops. Abdomen:  Normal bowel sounds. Soft, non-tender and non-distended without masses, hepatosplenomegaly or hernias noted.  No guarding or rebound tenderness.   Rectal: Not performed Msk:  Symmetrical without gross deformities. Good, equal movement & strength bilaterally. Pulses:  Normal pulses noted. Extremities:  No clubbing or edema.  No cyanosis. Neurologic:  Alert and oriented x3;  grossly normal neurologically. Skin:  Intact without significant lesions or rashes. No jaundice. Lymph Nodes:  No significant cervical adenopathy. Psych:  Alert and cooperative. Normal mood and affect.  Imaging Studies: Reviewed  Assessment and Plan:   Nicole Wyatt is a 58 y.o. female with morbid obesity, CPAP, COPD, anxiety, depression known history of severe erosive esophagitis is seen in consultation for dysphagia  Continue omeprazole 40 mg twice daily before meals Discussed about healthy eating habits, exercise and weight loss as patient is persistent about surgery for fixing her reflux.  Advised her that with her BMI, fundoplication would be technically challenging.  Also, recommend EGD to rule out any esophageal stricture which would be a contraindication for fundoplication She is agreeable to undergo EGD. Reiterated about lifestyle modification, weight loss and discussed about weight loss medications with her PCP if possible  I have  discussed alternative options, risks & benefits,  which include, but are not limited to, bleeding, infection, perforation,respiratory complication & drug reaction.  The patient agrees with this plan & written consent will be obtained.      Follow up after EGD   Arlyss Repress, MD

## 2024-01-20 NOTE — Telephone Encounter (Signed)
Patient called back and we moved patient to 02/12/2024 and sent new instructions to patient. Called endo and informed Baxter Hire and she will move patient

## 2024-01-20 NOTE — Telephone Encounter (Signed)
Per trish in ENDO she will need to reschedule EGD because on 01/30/2024 she does not have enough time to do the EGD. Called patient and patient number states it is no longer in service.

## 2024-01-23 ENCOUNTER — Encounter: Payer: Self-pay | Admitting: Gastroenterology

## 2024-02-12 ENCOUNTER — Telehealth: Payer: Self-pay

## 2024-02-12 NOTE — Telephone Encounter (Signed)
 Patient left a voicemail on my phone yesterday afternoon stating she needs to cancel her EGD for 02/11/2024. Return patient call and she said she cancel it because she states she lives in [place] and her daughter does not know how to drive in the snow. We reschedule patient to 02/20/2024. Called Endo and talk to Troy and she will get patient moved

## 2024-02-13 ENCOUNTER — Encounter: Payer: Self-pay | Admitting: Gastroenterology

## 2024-02-19 ENCOUNTER — Encounter: Payer: Self-pay | Admitting: Gastroenterology

## 2024-02-20 ENCOUNTER — Other Ambulatory Visit: Payer: Self-pay

## 2024-02-20 ENCOUNTER — Ambulatory Visit
Admission: RE | Admit: 2024-02-20 | Discharge: 2024-02-20 | Disposition: A | Discharge status: Home / Self Care | Payer: 59 | Attending: Gastroenterology | Admitting: Gastroenterology

## 2024-02-20 ENCOUNTER — Encounter: Payer: Self-pay | Admitting: Gastroenterology

## 2024-02-20 ENCOUNTER — Ambulatory Visit: Payer: 59 | Admitting: Anesthesiology

## 2024-02-20 ENCOUNTER — Encounter
Admission: RE | Disposition: A | Discharge status: Home / Self Care | Payer: Self-pay | Source: Home / Self Care | Attending: Gastroenterology

## 2024-02-20 DIAGNOSIS — F17210 Nicotine dependence, cigarettes, uncomplicated: Secondary | ICD-10-CM | POA: Diagnosis not present

## 2024-02-20 DIAGNOSIS — F319 Bipolar disorder, unspecified: Secondary | ICD-10-CM | POA: Diagnosis not present

## 2024-02-20 DIAGNOSIS — J449 Chronic obstructive pulmonary disease, unspecified: Secondary | ICD-10-CM | POA: Insufficient documentation

## 2024-02-20 DIAGNOSIS — K209 Esophagitis, unspecified without bleeding: Secondary | ICD-10-CM | POA: Insufficient documentation

## 2024-02-20 DIAGNOSIS — I10 Essential (primary) hypertension: Secondary | ICD-10-CM | POA: Insufficient documentation

## 2024-02-20 DIAGNOSIS — E66813 Obesity, class 3: Secondary | ICD-10-CM | POA: Diagnosis not present

## 2024-02-20 DIAGNOSIS — G4730 Sleep apnea, unspecified: Secondary | ICD-10-CM | POA: Insufficient documentation

## 2024-02-20 DIAGNOSIS — K2210 Ulcer of esophagus without bleeding: Secondary | ICD-10-CM

## 2024-02-20 DIAGNOSIS — Z6841 Body Mass Index (BMI) 40.0 and over, adult: Secondary | ICD-10-CM | POA: Insufficient documentation

## 2024-02-20 DIAGNOSIS — Z7951 Long term (current) use of inhaled steroids: Secondary | ICD-10-CM | POA: Insufficient documentation

## 2024-02-20 HISTORY — PX: ESOPHAGOGASTRODUODENOSCOPY (EGD) WITH PROPOFOL: SHX5813

## 2024-02-20 SURGERY — ESOPHAGOGASTRODUODENOSCOPY (EGD) WITH PROPOFOL
Anesthesia: General

## 2024-02-20 MED ORDER — SODIUM CHLORIDE 0.9 % IV SOLN: INTRAVENOUS | Status: DC

## 2024-02-20 MED ORDER — LIDOCAINE HCL (CARDIAC) PF 100 MG/5ML IV SOSY
PREFILLED_SYRINGE | INTRAVENOUS | Status: DC | PRN
  Administered 2024-02-20: 100 mg via INTRAVENOUS

## 2024-02-20 MED ORDER — PROPOFOL 10 MG/ML IV BOLUS
INTRAVENOUS | Status: DC | PRN
  Administered 2024-02-20: 100 mg via INTRAVENOUS
  Administered 2024-02-20: 100 ug/kg/min via INTRAVENOUS

## 2024-02-20 MED ORDER — FENTANYL CITRATE (PF) 100 MCG/2ML IJ SOLN
INTRAMUSCULAR | Status: AC
  Filled 2024-02-20: qty 2

## 2024-02-20 MED ORDER — PROPOFOL 10 MG/ML IV BOLUS
INTRAVENOUS | Status: AC
  Filled 2024-02-20: qty 40

## 2024-02-20 MED ORDER — LIDOCAINE HCL (PF) 2 % IJ SOLN
INTRAMUSCULAR | Status: AC
  Filled 2024-02-20: qty 5

## 2024-02-20 MED ORDER — FENTANYL CITRATE (PF) 100 MCG/2ML IJ SOLN
INTRAMUSCULAR | Status: DC | PRN
  Administered 2024-02-20: 50 ug via INTRAVENOUS

## 2024-02-20 NOTE — Transfer of Care (Signed)
 Immediate Anesthesia Transfer of Care Note  Patient: Nicole Wyatt  Procedure(s) Performed: ESOPHAGOGASTRODUODENOSCOPY (EGD) WITH PROPOFOL  Patient Location: PACU  Anesthesia Type:MAC  Level of Consciousness: awake, alert , and oriented  Airway & Oxygen Therapy: Patient Spontanous Breathing and Patient connected to nasal cannula oxygen  Post-op Assessment: Report given to RN  Post vital signs: stable  Last Vitals:  Vitals Value Taken Time  BP    Temp    Pulse    Resp    SpO2      Last Pain:  Vitals:   02/20/24 1005  TempSrc: Temporal  PainSc: 0-No pain         Complications: No notable events documented.

## 2024-02-20 NOTE — Op Note (Signed)
 [facility] Gastroenterology Patient Name: Nicole Wyatt Procedure Date: 02/20/2024 10:55 AM MRN: [id number] Account #: [id number] Date of Birth: 1966/11/15 Admit Type: Outpatient Age: 58 Room: [facility] ENDO ROOM 3 Gender: Female Note Status: Finalized Instrument Name: Upper Endoscope [id number] Procedure:             Upper GI endoscopy Indications:           Follow-up of esophagitis Providers:             Toney Reil MD, MD Referring MD:          Toney Reil MD, MD (Referring MD), [facility], MD (Referring MD) Medicines:             General Anesthesia Complications:         No immediate complications. Estimated blood loss: None. Procedure:             Pre-Anesthesia Assessment:                        - Prior to the procedure, a History and Physical was                         performed, and patient medications and allergies were                         reviewed. The patient is competent. The risks and                         benefits of the procedure and the sedation options and                         risks were discussed with the patient. All questions                         were answered and informed consent was obtained.                         Patient identification and proposed procedure were                         verified by the physician, the nurse, the                         anesthesiologist, the anesthetist and the technician                         in the pre-procedure area in the procedure room in the                         endoscopy suite. Mental Status Examination: alert and                         oriented. Airway Examination: normal oropharyngeal                         airway and neck mobility. Respiratory Examination:  clear to auscultation. CV Examination: normal.                         Prophylactic Antibiotics: The patient does not require                          prophylactic antibiotics. Prior Anticoagulants: The                         patient has taken no anticoagulant or antiplatelet                         agents. ASA Grade Assessment: III - A patient with                         severe systemic disease. After reviewing the risks and                         benefits, the patient was deemed in satisfactory                         condition to undergo the procedure. The anesthesia                         plan was to use general anesthesia. Immediately prior                         to administration of medications, the patient was                         re-assessed for adequacy to receive sedatives. The                         heart rate, respiratory rate, oxygen saturations,                         blood pressure, adequacy of pulmonary ventilation, and                         response to care were monitored throughout the                         procedure. The physical status of the patient was                         re-assessed after the procedure.                        After obtaining informed consent, the endoscope was                         passed under direct vision. Throughout the procedure,                         the patient's blood pressure, pulse, and oxygen                         saturations were monitored continuously. The Endoscope  was introduced through the mouth, and advanced to the                         second part of duodenum. The upper GI endoscopy was                         accomplished without difficulty. The patient tolerated                         the procedure well. Findings:      The gastroesophageal junction and examined esophagus were normal.      The entire examined stomach was normal.      The cardia and gastric fundus were normal on retroflexion.      The duodenal bulb and second portion of the duodenum were normal. Impression:            - Normal gastroesophageal junction and  esophagus.                        - Normal stomach.                        - Normal duodenal bulb and second portion of the                         duodenum.                        - No specimens collected. Recommendation:        - Discharge patient to home (with escort).                        - Resume previous diet today.                        - Continue present medications.                        - Follow an antireflux regimen indefinitely. Procedure Code(s):     --- Professional ---                        [phone], Esophagogastroduodenoscopy, flexible,                         transoral; diagnostic, including collection of                         specimen(s) by brushing or washing, when performed                         (separate procedure) Diagnosis Code(s):     --- Professional ---                        K20.90, Esophagitis, unspecified without bleeding CPT copyright 2022 American Medical Association. All rights reserved. The codes documented in this report are preliminary and upon coder review may  be revised to meet current compliance requirements. Dr. Libby Maw Toney Reil MD, MD 02/20/2024 11:18:45 AM This report has been signed electronically. Number of Addenda: 0 Note Initiated On: 02/20/2024 10:55 AM Estimated Blood Loss:  Estimated blood loss: none.      [facility]

## 2024-02-20 NOTE — H&P (Signed)
 Arlyss Repress, MD [address]  [address]  [place], [place] [address]  Main: [phone]  Fax: [phone] Pager: [phone]  Primary Care Physician:  [facility] Primary Gastroenterologist:  Dr. Arlyss Repress  Pre-Procedure History & Physical: HPI:  Nicole Wyatt is a 58 y.o. female is here for an endoscopy.   Past Medical History:  Diagnosis Date   Abnormal Pap smear of cervix    Anxiety    Arthritis    Bipolar 1 disorder (HCC)    COPD (chronic obstructive pulmonary disease) (HCC)    Depression    Dyspnea    with exertion    Dysrhythmia    History of kidney stones    Hyperlipidemia    Hypertension    Oxygen dependent    patient uses 2L oz PRN ; reports hardly ever uses it unless very SOB     [facility] spotted fever 2018   Sarcoidosis of lung (HCC)    managed by Dr Lindie Spruce    Seizures [facility]) last seizure 05/2013   Sleep apnea    uses CPAP nightly    UTI (urinary tract infection) 11/13/2018   see ED visit in epic ; was dc'd with oral abx and reports completed and relief of sx     Past Surgical History:  Procedure Laterality Date   ABDOMINAL HYSTERECTOMY  1999   BLADDER SURGERY     CARDIAC CATHETERIZATION Bilateral 05/29/2016   Procedure: Right/Left Heart Cath and Coronary Angiography;  Surgeon: Lamar Blinks, MD;  Location: [facility] INVASIVE CV LAB;  Service: Cardiovascular;  Laterality: Bilateral;   COLONOSCOPY     COLONOSCOPY N/A 07/19/2015   Procedure: COLONOSCOPY;  Surgeon: Earline Mayotte, MD;  Location: [facility] ENDOSCOPY;  Service: Endoscopy;  Laterality: N/A;   CYSTOSCOPY WITH STENT PLACEMENT Left 01/05/2020   Procedure: CYSTOSCOPY WITH STENT PLACEMENT;  Surgeon: Riki Altes, MD;  Location: [facility] ORS;  Service: Urology;  Laterality: Left;   CYSTOSCOPY/URETEROSCOPY/HOLMIUM LASER/STENT PLACEMENT Left 01/26/2020   Procedure: CYSTOSCOPY/URETEROSCOPY/HOLMIUM LASER/STENT Exchange;  Surgeon: Riki Altes, MD;  Location: [facility] ORS;   Service: Urology;  Laterality: Left;   ESOPHAGOGASTRODUODENOSCOPY (EGD) WITH PROPOFOL N/A 03/01/2021   Procedure: ESOPHAGOGASTRODUODENOSCOPY (EGD) WITH PROPOFOL;  Surgeon: Toney Reil, MD;  Location: [facility] ENDOSCOPY;  Service: Gastroenterology;  Laterality: N/A;   ESOPHAGOGASTRODUODENOSCOPY (EGD) WITH PROPOFOL N/A 07/03/2021   Procedure: ESOPHAGOGASTRODUODENOSCOPY (EGD) WITH PROPOFOL;  Surgeon: Wyline Mood, MD;  Location: [facility] ENDOSCOPY;  Service: Gastroenterology;  Laterality: N/A;   FLEXIBLE BRONCHOSCOPY N/A 07/27/2016   Procedure: FLEXIBLE BRONCHOSCOPY;  Surgeon: Yevonne Pax, MD;  Location: [facility] ORS;  Service: Pulmonary;  Laterality: N/A;   FOOT SURGERY     JOINT REPLACEMENT Left    hip   KIDNEY STONE SURGERY  7 years   TOTAL HIP ARTHROPLASTY Left 12/02/2018   Procedure: TOTAL HIP ARTHROPLASTY ANTERIOR APPROACH;  Surgeon: Sheral Apley, MD;  Location: [facility] ORS;  Service: Orthopedics;  Laterality: Left;   TUBAL LIGATION      Prior to Admission medications   Medication Sig Start Date End Date Taking? Authorizing Provider  albuterol (VENTOLIN HFA) 108 (90 Base) MCG/ACT inhaler Inhale 2 puffs into the lungs every 4 (four) hours as needed for wheezing or shortness of breath. 04/17/21  Yes Irean Hong, MD  amLODipine (NORVASC) 10 MG tablet Take 10 mg by mouth daily. 10/10/20  Yes [provider]  furosemide (LASIX) 40 MG tablet Take 1 tablet (40 mg total) by mouth daily  as needed for fluid. Patient taking differently: Take 40 mg by mouth daily as needed for fluid or edema. 01/06/19  Yes Boscia, Kathlynn Grate, NP  meloxicam (MOBIC) 15 MG tablet Take 1 tablet (15 mg total) by mouth daily. 09/21/21  Yes Cuthriell, Delorise Royals, PA-C  omeprazole (PRILOSEC) 40 MG capsule TAKE (1) CAPSULE TWICE DAILY BEFORE MEALS. 04/05/22  Yes Serenna Deroy, Loel Dubonnet, MD  venlafaxine (EFFEXOR) 75 MG tablet Take 225 mg by mouth daily. 08/03/20  Yes [provider]  aspirin EC 81 MG tablet Take 1 tablet (81  mg total) by mouth 2 (two) times daily. For DVT prophylaxis for 30 days after surgery. Patient taking differently: Take 81 mg by mouth daily. 12/02/18   Martensen, Lucretia Kern III, PA-C  budesonide-formoterol (SYMBICORT) 160-4.5 MCG/ACT inhaler Inhale 2 puffs into the lungs 2 (two) times daily. 08/18/20   Coralyn Helling, MD  EPINEPHrine 0.3 mg/0.3 mL IJ SOAJ injection Inject 0.3 mg into the muscle as needed for anaphylaxis.    [provider]  fluticasone (FLONASE) 50 MCG/ACT nasal spray Place 2 sprays into both nostrils daily. Patient taking differently: Place 2 sprays into both nostrils daily as needed for allergies. 02/16/19   Fisher, Roselyn Bering, PA-C  ibuprofen (ADVIL) 600 MG tablet Take 1 tablet (600 mg total) by mouth every 6 (six) hours as needed. 02/28/23   Dionne Bucy, MD  nystatin (MYCOSTATIN/NYSTOP) powder Apply topically 4 (four) times daily. 07/06/22   Pokhrel, Rebekah Chesterfield, MD  ondansetron (ZOFRAN-ODT) 4 MG disintegrating tablet Take 1 tablet (4 mg total) by mouth every 8 (eight) hours as needed for nausea or vomiting. 02/28/23   Dionne Bucy, MD  SPIRIVA HANDIHALER 18 MCG inhalation capsule Place 18 mcg into inhaler and inhale daily. 09/14/20   [provider]    Allergies as of 01/20/2024 - Review Complete 01/20/2024  Allergen Reaction Noted   Bee venom Anaphylaxis 05/29/2016   Percocet [oxycodone-acetaminophen] Nausea And Vomiting 12/22/2021   Ciprofloxacin Nausea Only 06/23/2018   Penicillins Diarrhea and Rash 07/26/2016    Family History  Problem Relation Age of Onset   Hypertension Mother    Arthritis Mother    Heart disease Mother    Heart failure Mother    Hypertension Father    Arthritis Father    Prostate cancer Father    Heart disease Father    Heart attack Father    Arrhythmia Father 65       pacemaker implant   Ovarian cancer Maternal Grandmother     Social History   Socioeconomic History   Marital status: Single    Spouse name: Not on  file   Number of children: Not on file   Years of education: Not on file   Highest education level: Not on file  Occupational History   Not on file  Tobacco Use   Smoking status: Every Day    Current packs/day: 0.50    Average packs/day: 0.5 packs/day for 14.0 years (7.0 ttl pk-yrs)    Types: Cigarettes   Smokeless tobacco: Never  Vaping Use   Vaping status: Never Used  Substance and Sexual Activity   Alcohol use: Not Currently    Alcohol/week: 2.0 standard drinks of alcohol    Types: 2 Glasses of wine per week   Drug use: No   Sexual activity: Never  Other Topics Concern   Not on file  Social History Narrative   Not on file   Social Drivers of Health   Financial Resource Strain: Not  on file  Food Insecurity: Not on file  Transportation Needs: Not on file  Physical Activity: Not on file  Stress: Not on file  Social Connections: Not on file  Intimate Partner Violence: Not on file    Review of Systems: See HPI, otherwise negative ROS  Physical Exam: BP 119/67   Pulse 63   Temp (!) 96.7 F (35.9 C) (Temporal)   Resp 16   Ht 5\' 7"  (1.702 m)   Wt 118.4 kg   SpO2 96%   BMI 40.88 kg/m  General:   Alert,  pleasant and cooperative in NAD Head:  Normocephalic and atraumatic. Neck:  Supple; no masses or thyromegaly. Lungs:  Clear throughout to auscultation.    Heart:  Regular rate and rhythm. Abdomen:  Soft, nontender and nondistended. Normal bowel sounds, without guarding, and without rebound.   Neurologic:  Alert and  oriented x4;  grossly normal neurologically.  Impression/Plan: [name] is here for an endoscopy to be performed for follow up of erosive esophagitis  Risks, benefits, limitations, and alternatives regarding  endoscopy have been reviewed with the patient.  Questions have been answered.  All parties agreeable.   Lannette Donath, MD  02/20/2024, 10:08 AM

## 2024-02-20 NOTE — Anesthesia Postprocedure Evaluation (Signed)
 Anesthesia Post Note  Patient: Nicole Wyatt  Procedure(s) Performed: ESOPHAGOGASTRODUODENOSCOPY (EGD) WITH PROPOFOL  Patient location during evaluation: PACU Anesthesia Type: General Level of consciousness: awake and awake and alert Pain management: satisfactory to patient Vital Signs Assessment: post-procedure vital signs reviewed and stable Respiratory status: spontaneous breathing Cardiovascular status: blood pressure returned to baseline Anesthetic complications: no   No notable events documented.   Last Vitals:  Vitals:   02/20/24 1136 02/20/24 1144  BP: 119/77 123/66  Pulse: 65 61  Resp: 16 17  Temp:  (!) 36.1 C  SpO2:  97%    Last Pain:  Vitals:   02/20/24 1144  TempSrc: Temporal  PainSc: 0-No pain                 VAN STAVEREN,Shayne Deerman

## 2024-02-20 NOTE — Anesthesia Preprocedure Evaluation (Signed)
 Anesthesia Evaluation  Patient identified by MRN, date of birth, ID band Patient awake    Reviewed: Allergy & Precautions, NPO status , Patient's Chart, lab work & pertinent test results  Airway Mallampati: III  TM Distance: >3 FB Neck ROM: full    Dental  (+) Missing   Pulmonary neg pulmonary ROS, asthma , sleep apnea and Continuous Positive Airway Pressure Ventilation , COPD,  COPD inhaler, Current Smoker and Patient abstained from smoking.   Pulmonary exam normal  + decreased breath sounds      Cardiovascular Exercise Tolerance: Good hypertension, + angina  negative cardio ROS Normal cardiovascular exam+ dysrhythmias  Rhythm:Regular Rate:Normal     Neuro/Psych Seizures -, Well Controlled,   Anxiety  Bipolar Disorder   negative neurological ROS  negative psych ROS   GI/Hepatic negative GI ROS, Neg liver ROS,GERD  Medicated,,  Endo/Other  negative endocrine ROS  Class 3 obesity  Renal/GU negative Renal ROS  negative genitourinary   Musculoskeletal   Abdominal  (+) + obese  Peds negative pediatric ROS (+)  Hematology negative hematology ROS (+)   Anesthesia Other Findings Past Medical History: No date: Abnormal Pap smear of cervix No date: Anxiety No date: Arthritis No date: Bipolar 1 disorder (HCC) No date: COPD (chronic obstructive pulmonary disease) (HCC) No date: Depression No date: Dyspnea     Comment:  with exertion  No date: Dysrhythmia No date: History of kidney stones No date: Hyperlipidemia No date: Hypertension No date: Oxygen dependent     Comment:  patient uses 2L oz PRN ; reports hardly ever uses it               unless very SOB   2018: [facility] spotted fever No date: Sarcoidosis of lung (HCC)     Comment:  managed by Dr Lindie Spruce  last seizure 05/2013: Seizures (HCC) No date: Sleep apnea     Comment:  uses CPAP nightly  11/13/2018: UTI (urinary tract infection)     Comment:  see ED  visit in epic ; was dc'd with oral abx and               reports completed and relief of sx   Past Surgical History: 1999: ABDOMINAL HYSTERECTOMY No date: BLADDER SURGERY 05/29/2016: CARDIAC CATHETERIZATION; Bilateral     Comment:  Procedure: Right/Left Heart Cath and Coronary               Angiography;  Surgeon: Lamar Blinks, MD;  Location:               [facility] INVASIVE CV LAB;  Service: Cardiovascular;                Laterality: Bilateral; No date: COLONOSCOPY 07/19/2015: COLONOSCOPY; N/A     Comment:  Procedure: COLONOSCOPY;  Surgeon: Earline Mayotte, MD;              Location: [facility] ENDOSCOPY;  Service: Endoscopy;                Laterality: N/A; 01/05/2020: CYSTOSCOPY WITH STENT PLACEMENT; Left     Comment:  Procedure: CYSTOSCOPY WITH STENT PLACEMENT;  Surgeon:               Riki Altes, MD;  Location: [facility] ORS;  Service:               Urology;  Laterality: Left; 01/26/2020: CYSTOSCOPY/URETEROSCOPY/HOLMIUM LASER/STENT PLACEMENT; Left     Comment:  Procedure: CYSTOSCOPY/URETEROSCOPY/HOLMIUM LASER/STENT  Exchange;  Surgeon: Riki Altes, MD;  Location: [facility]              ORS;  Service: Urology;  Laterality: Left; 03/01/2021: ESOPHAGOGASTRODUODENOSCOPY (EGD) WITH PROPOFOL; N/A     Comment:  Procedure: ESOPHAGOGASTRODUODENOSCOPY (EGD) WITH               PROPOFOL;  Surgeon: Toney Reil, MD;  Location:               [facility] ENDOSCOPY;  Service: Gastroenterology;  Laterality:               N/A; 07/03/2021: ESOPHAGOGASTRODUODENOSCOPY (EGD) WITH PROPOFOL; N/A     Comment:  Procedure: ESOPHAGOGASTRODUODENOSCOPY (EGD) WITH               PROPOFOL;  Surgeon: Wyline Mood, MD;  Location: [facility]               ENDOSCOPY;  Service: Gastroenterology;  Laterality: N/A; 07/27/2016: FLEXIBLE BRONCHOSCOPY; N/A     Comment:  Procedure: FLEXIBLE BRONCHOSCOPY;  Surgeon: Yevonne Pax, MD;  Location: [facility] ORS;  Service: Pulmonary;                Laterality: N/A; No date:  FOOT SURGERY No date: JOINT REPLACEMENT; Left     Comment:  hip 7 years: KIDNEY STONE SURGERY 12/02/2018: TOTAL HIP ARTHROPLASTY; Left     Comment:  Procedure: TOTAL HIP ARTHROPLASTY ANTERIOR APPROACH;                Surgeon: Sheral Apley, MD;  Location: [facility] ORS;                Service: Orthopedics;  Laterality: Left; No date: TUBAL LIGATION  BMI    Body Mass Index: 40.88 kg/m      Reproductive/Obstetrics negative OB ROS                              Anesthesia Physical Anesthesia Plan  ASA: 3  Anesthesia Plan: General   Post-op Pain Management:    Induction: Intravenous  PONV Risk Score and Plan: Propofol infusion and TIVA  Airway Management Planned: Natural Airway and Nasal Cannula  Additional Equipment:   Intra-op Plan:   Post-operative Plan:   Informed Consent: I have reviewed the patients History and Physical, chart, labs and discussed the procedure including the risks, benefits and alternatives for the proposed anesthesia with the patient or authorized representative who has indicated his/her understanding and acceptance.     Dental Advisory Given  Plan Discussed with: CRNA  Anesthesia Plan Comments:          Anesthesia Quick Evaluation

## 2024-02-21 ENCOUNTER — Encounter: Payer: Self-pay | Admitting: Gastroenterology

## 2024-03-02 ENCOUNTER — Observation Stay
Admission: EM | Admit: 2024-03-02 | Discharge: 2024-03-06 | Disposition: A | Discharge status: Home / Self Care | Attending: Student | Admitting: Student

## 2024-03-02 ENCOUNTER — Other Ambulatory Visit: Payer: Self-pay

## 2024-03-02 ENCOUNTER — Observation Stay

## 2024-03-02 DIAGNOSIS — K2210 Ulcer of esophagus without bleeding: Secondary | ICD-10-CM | POA: Diagnosis not present

## 2024-03-02 DIAGNOSIS — G4733 Obstructive sleep apnea (adult) (pediatric): Secondary | ICD-10-CM | POA: Diagnosis present

## 2024-03-02 DIAGNOSIS — Z96642 Presence of left artificial hip joint: Secondary | ICD-10-CM | POA: Insufficient documentation

## 2024-03-02 DIAGNOSIS — Z7982 Long term (current) use of aspirin: Secondary | ICD-10-CM | POA: Diagnosis not present

## 2024-03-02 DIAGNOSIS — F17210 Nicotine dependence, cigarettes, uncomplicated: Secondary | ICD-10-CM | POA: Insufficient documentation

## 2024-03-02 DIAGNOSIS — R109 Unspecified abdominal pain: Secondary | ICD-10-CM | POA: Diagnosis not present

## 2024-03-02 DIAGNOSIS — I110 Hypertensive heart disease with heart failure: Secondary | ICD-10-CM | POA: Insufficient documentation

## 2024-03-02 DIAGNOSIS — E162 Hypoglycemia, unspecified: Secondary | ICD-10-CM | POA: Diagnosis not present

## 2024-03-02 DIAGNOSIS — Z79899 Other long term (current) drug therapy: Secondary | ICD-10-CM | POA: Diagnosis not present

## 2024-03-02 DIAGNOSIS — I10 Essential (primary) hypertension: Secondary | ICD-10-CM | POA: Diagnosis present

## 2024-03-02 DIAGNOSIS — D860 Sarcoidosis of lung: Secondary | ICD-10-CM | POA: Diagnosis not present

## 2024-03-02 DIAGNOSIS — I5032 Chronic diastolic (congestive) heart failure: Secondary | ICD-10-CM | POA: Diagnosis present

## 2024-03-02 DIAGNOSIS — F418 Other specified anxiety disorders: Secondary | ICD-10-CM | POA: Diagnosis present

## 2024-03-02 DIAGNOSIS — J449 Chronic obstructive pulmonary disease, unspecified: Secondary | ICD-10-CM | POA: Diagnosis present

## 2024-03-02 DIAGNOSIS — E66813 Obesity, class 3: Secondary | ICD-10-CM | POA: Diagnosis not present

## 2024-03-02 DIAGNOSIS — Z720 Tobacco use: Secondary | ICD-10-CM | POA: Diagnosis present

## 2024-03-02 DIAGNOSIS — K208 Other esophagitis without bleeding: Secondary | ICD-10-CM | POA: Diagnosis not present

## 2024-03-02 DIAGNOSIS — Z6841 Body Mass Index (BMI) 40.0 and over, adult: Secondary | ICD-10-CM | POA: Diagnosis not present

## 2024-03-02 LAB — COMPREHENSIVE METABOLIC PANEL
ALT: 20 U/L (ref 0–44)
AST: 23 U/L (ref 15–41)
Albumin: 4.4 g/dL (ref 3.5–5.0)
Alkaline Phosphatase: 90 U/L (ref 38–126)
Anion gap: 9 (ref 5–15)
BUN: 16 mg/dL (ref 6–20)
CO2: 27 mmol/L (ref 22–32)
Calcium: 9.3 mg/dL (ref 8.9–10.3)
Chloride: 103 mmol/L (ref 98–111)
Creatinine, Ser: 0.78 mg/dL (ref 0.44–1.00)
GFR, Estimated: >60 mL/min (ref >60)
Glucose, Bld: 95 mg/dL (ref 70–99)
Potassium: 4.1 mmol/L (ref 3.5–5.1)
Sodium: 139 mmol/L (ref 135–145)
Total Bilirubin: 0.7 mg/dL (ref 0.0–1.2)
Total Protein: 8.4 g/dL — ABNORMAL HIGH (ref 6.5–8.1)

## 2024-03-02 LAB — BRAIN NATRIURETIC PEPTIDE: B Natriuretic Peptide: 20.9 pg/mL (ref 0.0–100.0)

## 2024-03-02 LAB — CBG MONITORING, ED
Glucose-Capillary: 121 mg/dL — ABNORMAL HIGH (ref 70–99)
Glucose-Capillary: 39 mg/dL — CL (ref 70–99)
Glucose-Capillary: 77 mg/dL (ref 70–99)
Glucose-Capillary: 88 mg/dL (ref 70–99)

## 2024-03-02 LAB — CBC
HCT: 43.5 % (ref 36.0–46.0)
Hemoglobin: 14.7 g/dL (ref 12.0–15.0)
MCH: 32 pg (ref 26.0–34.0)
MCHC: 33.8 g/dL (ref 30.0–36.0)
MCV: 94.8 fL (ref 80.0–100.0)
Platelets: 281 10*3/uL (ref 150–400)
RBC: 4.59 MIL/uL (ref 3.87–5.11)
RDW: 14.1 % (ref 11.5–15.5)
WBC: 5.9 10*3/uL (ref 4.0–10.5)
nRBC: 0 % (ref 0.0–0.2)

## 2024-03-02 LAB — CK: Total CK: 143 U/L (ref 38–234)

## 2024-03-02 LAB — LIPASE, BLOOD: Lipase: 70 U/L — ABNORMAL HIGH (ref 11–51)

## 2024-03-02 LAB — TROPONIN I (HIGH SENSITIVITY): Troponin I (High Sensitivity): 5 ng/L (ref <18)

## 2024-03-02 MED ORDER — SODIUM CHLORIDE 0.9 % IV BOLUS
1000.0000 mL | Freq: Once | INTRAVENOUS | Status: AC
  Administered 2024-03-02: 1000 mL via INTRAVENOUS

## 2024-03-02 MED ORDER — DEXTROSE 50 % IV SOLN
50.0000 mL | INTRAVENOUS | Status: DC | PRN
  Administered 2024-03-02: 50 mL via INTRAVENOUS
  Filled 2024-03-02: qty 50

## 2024-03-02 MED ORDER — MORPHINE SULFATE (PF) 2 MG/ML IV SOLN: 1.0000 mg | INTRAVENOUS | Status: DC | PRN

## 2024-03-02 MED ORDER — ENOXAPARIN SODIUM 40 MG/0.4ML IJ SOSY: 40.0000 mg | PREFILLED_SYRINGE | INTRAMUSCULAR | Status: DC

## 2024-03-02 MED ORDER — ALBUTEROL SULFATE (2.5 MG/3ML) 0.083% IN NEBU: 2.5000 mg | INHALATION_SOLUTION | RESPIRATORY_TRACT | Status: DC | PRN

## 2024-03-02 MED ORDER — ONDANSETRON HCL 4 MG/2ML IJ SOLN
4.0000 mg | Freq: Once | INTRAMUSCULAR | Status: AC
  Administered 2024-03-02: 4 mg via INTRAVENOUS
  Filled 2024-03-02: qty 2

## 2024-03-02 MED ORDER — HYDRALAZINE HCL 20 MG/ML IJ SOLN: 5.0000 mg | INTRAMUSCULAR | Status: DC | PRN

## 2024-03-02 MED ORDER — ACETAMINOPHEN 325 MG PO TABS: 650.0000 mg | ORAL_TABLET | Freq: Four times a day (QID) | ORAL | Status: DC | PRN

## 2024-03-02 MED ORDER — IOHEXOL 350 MG/ML SOLN
100.0000 mL | Freq: Once | INTRAVENOUS | Status: AC | PRN
Start: 2024-03-02 — End: 2024-03-02
  Administered 2024-03-02: 100 mL via INTRAVENOUS

## 2024-03-02 MED ORDER — PANTOPRAZOLE SODIUM 40 MG IV SOLR
40.0000 mg | Freq: Once | INTRAVENOUS | Status: AC
  Administered 2024-03-02: 40 mg via INTRAVENOUS
  Filled 2024-03-02: qty 10

## 2024-03-02 MED ORDER — NICOTINE 21 MG/24HR TD PT24
21.0000 mg | MEDICATED_PATCH | Freq: Every day | TRANSDERMAL | Status: DC
  Administered 2024-03-02 – 2024-03-06 (×5): 21 mg via TRANSDERMAL
  Filled 2024-03-02 (×5): qty 1

## 2024-03-02 MED ORDER — DM-GUAIFENESIN ER 30-600 MG PO TB12: 1.0000 | ORAL_TABLET | Freq: Two times a day (BID) | ORAL | Status: DC | PRN

## 2024-03-02 MED ORDER — ONDANSETRON HCL 4 MG/2ML IJ SOLN
4.0000 mg | Freq: Three times a day (TID) | INTRAMUSCULAR | Status: DC | PRN
  Administered 2024-03-03 – 2024-03-04 (×3): 4 mg via INTRAVENOUS
  Filled 2024-03-02 (×3): qty 2

## 2024-03-02 MED ORDER — ENOXAPARIN SODIUM 60 MG/0.6ML IJ SOSY
0.5000 mg/kg | PREFILLED_SYRINGE | INTRAMUSCULAR | Status: DC
  Administered 2024-03-02 – 2024-03-05 (×4): 60 mg via SUBCUTANEOUS
  Filled 2024-03-02 (×4): qty 0.6

## 2024-03-02 NOTE — ED Notes (Signed)
 Report called to [facility].

## 2024-03-02 NOTE — ED Notes (Signed)
 CBG is 39 upon arrival to department. Pt given juice, a snack and an amp of D50 at this time.

## 2024-03-02 NOTE — ED Triage Notes (Signed)
 Pt BIB EMS for hypoglycemia. Pt reports that she is unable to keep her BG up x 1 week. Endorses nausea and chest pain as well.

## 2024-03-02 NOTE — ED Notes (Signed)
 CCMD called to place pt on monitor

## 2024-03-02 NOTE — H&P (Signed)
 History and Physical    Nicole Wyatt MVH:[id number] DOB: July 07, 1966 DOA: 03/02/2024  Referring MD/NP/PA:   PCP: [facility]   Patient coming from:  The patient is coming from home.     Chief Complaint: Hypoglycemia and abdominal pain  HPI: Nicole Wyatt is a 58 y.o. female with medical history significant of morbid obesity on Victoza, hypertension, hyperlipidemia, COPD on 2 L oxygen, dCHF, erosive esophagitis, depression with anxiety, bipolar, OSA on CPAP, sarcoidosis of lung, pulmonary hypertension, kidney stone, smoker, who presents with hypoglycemia and abdominal pain.  Per patient and her daughter at the bedside, patient has been having hypoglycemia in the past several days.    She does not have diabetes.  She is taking Victoza for weight loss, but this was discontinued this past Monday with her primary care physician given issues with her glucose. She continues to have low blood sugar. On Thursday, patient had blood sugar dropped to 34. States that EMS give her oral glucose and she drinks sugar water, her blood sugar came up immediately, but then continued to drop back down.  Today her blood sugar dropped to 40.  They called her PCP who recommended patient to come to the emergency room for further evaluation and treatment. States that she ate a hamburger and a hot dog approximately 2 hours ago prior to arrival. Her blood sugar is 88 by BMP in ED. The repeated blood sugar is 77.  Patient stated that she has poor appetite and decreased oral intake due to Victoza use.  Pt has nausea and abdominal pain which is located in the bilateral lower abdomen, constant, aching, mild to moderate, nonradiating.  No diarrhea.  No fever or chills.  Patient had some chest discomfort earlier, which has resolved.  Currently no active chest pain, cough, SOB. No symptoms of UTI.   Data reviewed independently and ED Course: pt was found to have WBC 5.9, GFR> 60, lipase 70, BNP 20.9   temperature normal, blood pressure 138/75, heart rate 74, RR 20, oxygen saturation 99% on room air.  Patient is placed on telemetry bed for observation.   EKG: I have personally reviewed.  Sinus rhythm, bifascicular block, poor R wave progression, QTc 474   Review of Systems:   General: no fevers, chills, no body weight gain, has poor appetite, has fatigue HEENT: no blurry vision, hearing changes or sore throat Respiratory: no dyspnea, coughing, wheezing CV: no chest pain, no palpitations GI: has nausea,  abdominal pain, no diarrhea, constipation, vomiting, GU: no dysuria, burning on urination, increased urinary frequency, hematuria  Ext: no leg edema Neuro: no unilateral weakness, numbness, or tingling, no vision change or hearing loss Skin: no rash, no skin tear. MSK: No muscle spasm, no deformity, no limitation of range of movement in spin Heme: No easy bruising.  Travel history: No recent long distant travel.   Allergy:  Allergies  Allergen Reactions   Bee Venom Anaphylaxis   Percocet [Oxycodone-Acetaminophen] Nausea And Vomiting    Hallucinations    Ciprofloxacin Nausea Only   Penicillins Diarrhea and Rash    Did it involve swelling of the face/tongue/throat, SOB, or low BP? No Did it involve sudden or severe rash/hives, skin peeling, or any reaction on the inside of your mouth or nose? No Did you need to seek medical attention at a hospital or doctor's office? No When did it last happen?      4-5 years If all above answers are "NO", may proceed with cephalosporin  use.      Past Medical History:  Diagnosis Date   Abnormal Pap smear of cervix    Anxiety    Arthritis    Bipolar 1 disorder (HCC)    COPD (chronic obstructive pulmonary disease) (HCC)    Depression    Dyspnea    with exertion    Dysrhythmia    History of kidney stones    Hyperlipidemia    Hypertension    Oxygen dependent    patient uses 2L oz PRN ; reports hardly ever uses it unless very SOB      [facility] spotted fever 2018   Sarcoidosis of lung (HCC)    managed by Dr Lindie Spruce    Seizures [facility]) last seizure 05/2013   Sleep apnea    uses CPAP nightly    UTI (urinary tract infection) 11/13/2018   see ED visit in epic ; was dc'd with oral abx and reports completed and relief of sx     Past Surgical History:  Procedure Laterality Date   ABDOMINAL HYSTERECTOMY  1999   BLADDER SURGERY     CARDIAC CATHETERIZATION Bilateral 05/29/2016   Procedure: Right/Left Heart Cath and Coronary Angiography;  Surgeon: Lamar Blinks, MD;  Location: [facility] INVASIVE CV LAB;  Service: Cardiovascular;  Laterality: Bilateral;   COLONOSCOPY     COLONOSCOPY N/A 07/19/2015   Procedure: COLONOSCOPY;  Surgeon: Earline Mayotte, MD;  Location: [facility] ENDOSCOPY;  Service: Endoscopy;  Laterality: N/A;   CYSTOSCOPY WITH STENT PLACEMENT Left 01/05/2020   Procedure: CYSTOSCOPY WITH STENT PLACEMENT;  Surgeon: Riki Altes, MD;  Location: [facility] ORS;  Service: Urology;  Laterality: Left;   CYSTOSCOPY/URETEROSCOPY/HOLMIUM LASER/STENT PLACEMENT Left 01/26/2020   Procedure: CYSTOSCOPY/URETEROSCOPY/HOLMIUM LASER/STENT Exchange;  Surgeon: Riki Altes, MD;  Location: [facility] ORS;  Service: Urology;  Laterality: Left;   ESOPHAGOGASTRODUODENOSCOPY (EGD) WITH PROPOFOL N/A 03/01/2021   Procedure: ESOPHAGOGASTRODUODENOSCOPY (EGD) WITH PROPOFOL;  Surgeon: Toney Reil, MD;  Location: [facility] ENDOSCOPY;  Service: Gastroenterology;  Laterality: N/A;   ESOPHAGOGASTRODUODENOSCOPY (EGD) WITH PROPOFOL N/A 07/03/2021   Procedure: ESOPHAGOGASTRODUODENOSCOPY (EGD) WITH PROPOFOL;  Surgeon: Wyline Mood, MD;  Location[facility] ENDOSCOPY;  Service: Gastroenterology;  Laterality: N/A;   ESOPHAGOGASTRODUODENOSCOPY (EGD) WITH PROPOFOL N/A 02/20/2024   Procedure: ESOPHAGOGASTRODUODENOSCOPY (EGD) WITH PROPOFOL;  Surgeon: Toney Reil, MD;  Location: [facility] ENDOSCOPY;  Service: Gastroenterology;  Laterality: N/A;   FLEXIBLE BRONCHOSCOPY N/A  07/27/2016   Procedure: FLEXIBLE BRONCHOSCOPY;  Surgeon: Yevonne Pax, MD;  Location: [facility] ORS;  Service: Pulmonary;  Laterality: N/A;   FOOT SURGERY     JOINT REPLACEMENT Left    hip   KIDNEY STONE SURGERY  7 years   TOTAL HIP ARTHROPLASTY Left 12/02/2018   Procedure: TOTAL HIP ARTHROPLASTY ANTERIOR APPROACH;  Surgeon: Sheral Apley, MD;  Location: [facility] ORS;  Service: Orthopedics;  Laterality: Left;   TUBAL LIGATION      Social History:  reports that she has been smoking cigarettes. She has a 7 pack-year smoking history. She has never used smokeless tobacco. She reports that she does not currently use alcohol after a past usage of about 2.0 standard drinks of alcohol per week. She reports that she does not use drugs.  Family History:  Family History  Problem Relation Age of Onset   Hypertension Mother    Arthritis Mother    Heart disease Mother    Heart failure Mother    Hypertension Father    Arthritis Father    Prostate cancer Father  Heart disease Father    Heart attack Father    Arrhythmia Father 10       pacemaker implant   Ovarian cancer Maternal Grandmother      Prior to Admission medications   Medication Sig Start Date End Date Taking? Authorizing Provider  albuterol (VENTOLIN HFA) 108 (90 Base) MCG/ACT inhaler Inhale 2 puffs into the lungs every 4 (four) hours as needed for wheezing or shortness of breath. 04/17/21   Irean Hong, MD  amLODipine (NORVASC) 10 MG tablet Take 10 mg by mouth daily. 10/10/20   [provider]  aspirin EC 81 MG tablet Take 1 tablet (81 mg total) by mouth 2 (two) times daily. For DVT prophylaxis for 30 days after surgery. Patient taking differently: Take 81 mg by mouth daily. 12/02/18   Martensen, Lucretia Kern III, PA-C  budesonide-formoterol (SYMBICORT) 160-4.5 MCG/ACT inhaler Inhale 2 puffs into the lungs 2 (two) times daily. 08/18/20   Coralyn Helling, MD  EPINEPHrine 0.3 mg/0.3 mL IJ SOAJ injection Inject 0.3 mg into the muscle as  needed for anaphylaxis.    [provider]  fluticasone (FLONASE) 50 MCG/ACT nasal spray Place 2 sprays into both nostrils daily. Patient taking differently: Place 2 sprays into both nostrils daily as needed for allergies. 02/16/19   Fisher, Roselyn Bering, PA-C  furosemide (LASIX) 40 MG tablet Take 1 tablet (40 mg total) by mouth daily as needed for fluid. Patient taking differently: Take 40 mg by mouth daily as needed for fluid or edema. 01/06/19   Carlean Jews, NP  ibuprofen (ADVIL) 600 MG tablet Take 1 tablet (600 mg total) by mouth every 6 (six) hours as needed. 02/28/23   Dionne Bucy, MD  meloxicam (MOBIC) 15 MG tablet Take 1 tablet (15 mg total) by mouth daily. 09/21/21   Cuthriell, Delorise Royals, PA-C  nystatin (MYCOSTATIN/NYSTOP) powder Apply topically 4 (four) times daily. 07/06/22   Pokhrel, Rebekah Chesterfield, MD  omeprazole (PRILOSEC) 40 MG capsule TAKE (1) CAPSULE TWICE DAILY BEFORE MEALS. 04/05/22   Toney Reil, MD  ondansetron (ZOFRAN-ODT) 4 MG disintegrating tablet Take 1 tablet (4 mg total) by mouth every 8 (eight) hours as needed for nausea or vomiting. 02/28/23   Dionne Bucy, MD  SPIRIVA HANDIHALER 18 MCG inhalation capsule Place 18 mcg into inhaler and inhale daily. 09/14/20   [provider]  venlafaxine (EFFEXOR) 75 MG tablet Take 225 mg by mouth daily. 08/03/20   [provider]    Physical Exam: Vitals:   03/02/24 1932 03/02/24 1934 03/02/24 2300 03/03/24 0048  BP:  138/75 130/65   Pulse: 74  69 66  Resp: 20  15 13   Temp:  98.6 F (37 C) 98.3 F (36.8 C)   TempSrc:  Oral    SpO2: 99%  98% 97%  Weight:      Height:       General: Not in acute distress HEENT:       Eyes: PERRL, EOMI, no jaundice       ENT: No discharge from the ears and nose, no pharynx injection, no tonsillar enlargement.        Neck: No JVD, no bruit, no mass felt. Heme: No neck lymph node enlargement. Cardiac: S1/S2, RRR, No murmurs, No gallops or rubs. Respiratory:  No rales, wheezing, rhonchi or rubs. GI: Soft, nondistended, has mild tenderness in the bilateral lower abdomen, no rebound pain, no organomegaly, BS present. GU: No hematuria Ext: No pitting leg edema bilaterally. 1+DP/PT pulse bilaterally. Musculoskeletal: No  joint deformities, No joint redness or warmth, no limitation of ROM in spin. Skin: No rashes.  Neuro: Alert, oriented X3, cranial nerves II-XII grossly intact, moves all extremities normally. Psych: Patient is not psychotic, no suicidal or hemocidal ideation.  Labs on Admission: I have personally reviewed following labs and imaging studies  CBC: Recent Labs  Lab 03/02/24 1940  WBC 5.9  HGB 14.7  HCT 43.5  MCV 94.8  PLT 281   Basic Metabolic Panel: Recent Labs  Lab 03/02/24 1940  NA 139  K 4.1  CL 103  CO2 27  GLUCOSE 95  BUN 16  CREATININE 0.78  CALCIUM 9.3   GFR: Estimated Creatinine Clearance: 101.8 mL/min (by C-G formula based on SCr of 0.78 mg/dL). Liver Function Tests: Recent Labs  Lab 03/02/24 1940  AST 23  ALT 20  ALKPHOS 90  BILITOT 0.7  PROT 8.4*  ALBUMIN 4.4   Recent Labs  Lab 03/02/24 1940  LIPASE 70*   No results for input(s): "AMMONIA" in the last 168 hours. Coagulation Profile: No results for input(s): "INR", "PROTIME" in the last 168 hours. Cardiac Enzymes: Recent Labs  Lab 03/02/24 1940  CKTOTAL 143   BNP (last 3 results) No results for input(s): "PROBNP" in the last 8760 hours. HbA1C: No results for input(s): "HGBA1C" in the last 72 hours. CBG: Recent Labs  Lab 03/02/24 1933 03/02/24 2004 03/02/24 2053 03/02/24 2336 03/03/24 0035  GLUCAP 77 88 121* 39* 110*   Lipid Profile: No results for input(s): "CHOL", "HDL", "LDLCALC", "TRIG", "CHOLHDL", "LDLDIRECT" in the last 72 hours. Thyroid Function Tests: No results for input(s): "TSH", "T4TOTAL", "FREET4", "T3FREE", "THYROIDAB" in the last 72 hours. Anemia Panel: No results for input(s): "VITAMINB12", "FOLATE",  "FERRITIN", "TIBC", "IRON", "RETICCTPCT" in the last 72 hours. Urine analysis:    Component Value Date/Time   COLORURINE YELLOW (A) 03/02/2024 2335   APPEARANCEUR CLEAR (A) 03/02/2024 2335   APPEARANCEUR Cloudy (A) 02/05/2020 1304   LABSPEC 1.042 (H) 03/02/2024 2335   LABSPEC 1.015 04/21/2015 2041   PHURINE 6.0 03/02/2024 2335   GLUCOSEU NEGATIVE 03/02/2024 2335   GLUCOSEU Negative 04/21/2015 2041   HGBUR NEGATIVE 03/02/2024 2335   BILIRUBINUR NEGATIVE 03/02/2024 2335   BILIRUBINUR Negative 02/05/2020 1304   BILIRUBINUR Negative 04/21/2015 2041   KETONESUR NEGATIVE 03/02/2024 2335   PROTEINUR NEGATIVE 03/02/2024 2335   UROBILINOGEN 0.2 08/27/2019 1437   NITRITE NEGATIVE 03/02/2024 2335   LEUKOCYTESUR TRACE (A) 03/02/2024 2335   LEUKOCYTESUR Negative 04/21/2015 2041   Sepsis Labs: @LABRCNTIP (procalcitonin:4,lacticidven:4) )No results found for this or any previous visit (from the past 240 hours).   Radiological Exams on Admission:   Assessment/Plan Principal Problem:   Hypoglycemia Active Problems:   Abdominal pain   Erosive esophagitis   Sarcoidosis of lung (HCC)   Chronic obstructive pulmonary disease (HCC)   Chronic diastolic CHF (congestive heart failure) (HCC)   Essential hypertension   Tobacco abuse   Depression with anxiety   OSA (obstructive sleep apnea)   Obesity, Class III, BMI 40-49.9 (morbid obesity) (HCC)   Assessment and Plan:  Hypoglycemia: Etiology is not clear.  Patient stopped taking Victoza 8 days ago, still has hypoglycemia.  Etiology is not clear.  -Place in tele bed for obs - Check Insulin, C-peptide, pro-insulin and beta hydroxybutyrate, - check sulfonylurea level - check Cortisol level - CBG q4h - D50 prn  Abdominal pain: Etiology is not clear.  Lipase slightly elevated at 70. -Follow-up CT scan of abdomen/pelvis -As needed Zofran and morphine  Erosive esophagitis -Protonix  Sarcoidosis of lung (HCC) and chronic obstructive  pulmonary disease (HCC): stable -Bronchodilators and as needed Mucinex  Chronic diastolic CHF (congestive heart failure) (HCC): 2D echo on 01/21/2020 showed EF of 60 to 65% with grade 1 diastolic dysfunction.  Patient does not have leg edema, no JVD.  CHF seems compensated. -Hold Lasix  Essential hypertension -Prn IV hydralazine as needed  Tobacco abuse -Nicotine patch  Depression with anxiety -Continue home medications  OSA (obstructive sleep apnea) -CPAP  Obesity, Class III, BMI 40-49.9 (morbid obesity) (HCC): Body weight 120.7 kg, BMI 41.66 -Encourage losing weight -Exercise and healthy diet -hold Victoza       DVT ppx:  SQ Lovenox  Code Status: Full code  Family Communication: Yes, patient's daughter and grand daughter   at bed side.    Disposition Plan:  Anticipate discharge back to previous environment  Consults called:  none  Admission status and Level of care: Telemetry Medical:    for obs     Dispo: The patient is from: Home              Anticipated d/c is to: Home              Anticipated d/c date is: 1 day              Patient currently is not medically stable to d/c.    Severity of Illness:  The appropriate patient status for this patient is OBSERVATION. Observation status is judged to be reasonable and necessary in order to provide the required intensity of service to ensure the patient's safety. The patient's presenting symptoms, physical exam findings, and initial radiographic and laboratory data in the context of their medical condition is felt to place them at decreased risk for further clinical deterioration. Furthermore, it is anticipated that the patient will be medically stable for discharge from the hospital within 2 midnights of admission.        Date of Service 03/03/2024    Lorretta Harp [facility]   If 7PM-7AM, please contact night-coverage [email or web address] 03/03/2024, 1:18 AM

## 2024-03-02 NOTE — ED Notes (Signed)
 MD at bedside.

## 2024-03-02 NOTE — Progress Notes (Signed)
 PHARMACIST - PHYSICIAN COMMUNICATION  CONCERNING:  Enoxaparin (Lovenox) for DVT Prophylaxis    RECOMMENDATION: Patient was prescribed enoxaprin 40mg  q24 hours for VTE prophylaxis.   Filed Weights   03/02/24 1929  Weight: 120.7 kg (266 lb)    Body mass index is 41.66 kg/m.  Estimated Creatinine Clearance: 101.8 mL/min (by C-G formula based on SCr of 0.78 mg/dL).   Based on [facility] policy patient is candidate for enoxaparin 0.5mg /kg TBW SQ every 24 hours based on BMI being >30.   DESCRIPTION: Pharmacy has adjusted enoxaparin dose per [facility] policy.  Patient is now receiving enoxaparin 60 mg every 24 hours    Merryl Hacker, PharmD Clinical Pharmacist  03/02/2024 9:10 PM

## 2024-03-02 NOTE — ED Provider Notes (Addendum)
 [facility] Provider Note    Event Date/Time   First MD Initiated Contact with Patient 03/02/24 1931     (approximate)   History   Hypoglycemia   HPI  Nicole Wyatt is a 58 y.o. female past medical history significant for COPD, hypertension, hyperlipidemia, obesity, esophagitis, who presents to the emergency department with hypoglycemia.  Patient states that she has been having trouble with hypoglycemia over the past 1 week.  States that her glucose has been dropping as low as 30.  States that she does not have any history of diabetes and does not take anything for diabetes.  She was on the Victoza for weight loss but this was discontinued this past Monday with her primary care physician given issues with her glucose.  States that last week she had EMS called out for her glucose that was low as 30.  States that she had EMS give her oral glucose and she drinks sugar water her sugar came up immediately but then continued to drop back down.  Called her primary care physician because it occurred again today and was told that she needed to come into the emergency department given her ongoing problems with hypoglycemia.  States that her glucose again was as low as 40 today.  States that she ate a hamburger and a hot dog approximately 2 hours ago prior to arrival.  Most recent glucose was 77.  Complaining of some nausea.  Denies any episodes of vomiting.  States that her urine has been very dark.  States that she has been having ongoing pain to her left lower abdomen for the past month and has a known kidney stone.  Denies any burning with urination.  Denies fever or chills.     Physical Exam   Triage Vital Signs: ED Triage Vitals  Encounter Vitals Group     BP --      Systolic BP Percentile --      Diastolic BP Percentile --      Pulse Rate 03/02/24 1932 74     Resp 03/02/24 1932 20     Temp --      Temp src --      SpO2 03/02/24 1932 99 %     Weight 03/02/24  1929 266 lb (120.7 kg)     Height 03/02/24 1929 5\' 7"  (1.702 m)     Head Circumference --      Peak Flow --      Pain Score 03/02/24 1928 9     Pain Loc --      Pain Education --      Exclude from Growth Chart --     Most recent vital signs: Vitals:   03/06/24 0415 03/06/24 0736  BP: 117/67 117/73  Pulse: 65 63  Resp: 18   Temp: 98.1 F (36.7 C) 98 F (36.7 C)  SpO2: 100% 100%    Physical Exam Constitutional:      Appearance: She is well-developed. She is obese.  HENT:     Head: Atraumatic.  Eyes:     Conjunctiva/sclera: Conjunctivae normal.  Cardiovascular:     Rate and Rhythm: Regular rhythm.  Pulmonary:     Effort: No respiratory distress.     Breath sounds: No wheezing.  Abdominal:     General: There is no distension.     Tenderness: There is no abdominal tenderness.  Musculoskeletal:        General: Normal range of motion.  Cervical back: Normal range of motion.  Skin:    General: Skin is warm.     Capillary Refill: Capillary refill takes less than 2 seconds.  Neurological:     Mental Status: She is alert. Mental status is at baseline.     IMPRESSION / MDM / ASSESSMENT AND PLAN / ED COURSE  I reviewed the triage vital signs and the nursing notes.  Differential diagnosis including hypoglycemia, dehydration, urinary tract infection, electrolyte abnormality  EKG  I, Corena Herter, the attending physician, personally viewed and interpreted this ECG.  EKG showed normal sinus rhythm.  Prolonged PR interval at 225.  QRS wide at 141.  QTc normal at 474.  Signs of LVH.  Appears to have a strain pattern.  No significant change when compared to prior EKG.  No tachycardic or bradycardic dysrhythmias while on cardiac telemetry.  RADIOLOGY  LABS (all labs ordered are listed, but only abnormal results are displayed) Labs interpreted as -    Labs Reviewed  COMPREHENSIVE METABOLIC PANEL - Abnormal; Notable for the following components:      Result Value    Total Protein 8.4 (*)    All other components within normal limits  LIPASE, BLOOD - Abnormal; Notable for the following components:   Lipase 70 (*)    All other components within normal limits  URINALYSIS, W/ REFLEX TO CULTURE (INFECTION SUSPECTED) - Abnormal; Notable for the following components:   Color, Urine YELLOW (*)    APPearance CLEAR (*)    Specific Gravity, Urine 1.042 (*)    Leukocytes,Ua TRACE (*)    Bacteria, UA RARE (*)    All other components within normal limits  BASIC METABOLIC PANEL - Abnormal; Notable for the following components:   Glucose, Bld 100 (*)    All other components within normal limits  INSULIN AND C-PEPTIDE, SERUM - Abnormal; Notable for the following components:   Insulin 46.6 (*)    C-Peptide 7.2 (*)    All other components within normal limits  PROINSULIN/INSULIN RATIO - Abnormal; Notable for the following components:   Insulin 33 (*)    All other components within normal limits  CORTISOL-AM, BLOOD - Abnormal; Notable for the following components:   Cortisol - AM 4.7 (*)    All other components within normal limits  GLUCOSE, CAPILLARY - Abnormal; Notable for the following components:   Glucose-Capillary 105 (*)    All other components within normal limits  BASIC METABOLIC PANEL - Abnormal; Notable for the following components:   Calcium 8.7 (*)    All other components within normal limits  CORTISOL-AM, BLOOD - Abnormal; Notable for the following components:   Cortisol - AM 2.7 (*)    All other components within normal limits  GLUCOSE, CAPILLARY - Abnormal; Notable for the following components:   Glucose-Capillary 111 (*)    All other components within normal limits  GLUCOSE, CAPILLARY - Abnormal; Notable for the following components:   Glucose-Capillary 105 (*)    All other components within normal limits  GLUCOSE, CAPILLARY - Abnormal; Notable for the following components:   Glucose-Capillary 60 (*)    All other components within normal  limits  GLUCOSE, CAPILLARY - Abnormal; Notable for the following components:   Glucose-Capillary 111 (*)    All other components within normal limits  GLUCOSE, CAPILLARY - Abnormal; Notable for the following components:   Glucose-Capillary 65 (*)    All other components within normal limits  GLUCOSE, CAPILLARY - Abnormal; Notable for the following components:  Glucose-Capillary 111 (*)    All other components within normal limits  GLUCOSE, CAPILLARY - Abnormal; Notable for the following components:   Glucose-Capillary 106 (*)    All other components within normal limits  GLUCOSE, CAPILLARY - Abnormal; Notable for the following components:   Glucose-Capillary 102 (*)    All other components within normal limits  GLUCOSE, CAPILLARY - Abnormal; Notable for the following components:   Glucose-Capillary 107 (*)    All other components within normal limits  GLUCOSE, CAPILLARY - Abnormal; Notable for the following components:   Glucose-Capillary 123 (*)    All other components within normal limits  GLUCOSE, CAPILLARY - Abnormal; Notable for the following components:   Glucose-Capillary 101 (*)    All other components within normal limits  GLUCOSE, CAPILLARY - Abnormal; Notable for the following components:   Glucose-Capillary 103 (*)    All other components within normal limits  CBG MONITORING, ED - Abnormal; Notable for the following components:   Glucose-Capillary 121 (*)    All other components within normal limits  CBG MONITORING, ED - Abnormal; Notable for the following components:   Glucose-Capillary 39 (*)    All other components within normal limits  CBG MONITORING, ED - Abnormal; Notable for the following components:   Glucose-Capillary 110 (*)    All other components within normal limits  CBG MONITORING, ED - Abnormal; Notable for the following components:   Glucose-Capillary 68 (*)    All other components within normal limits  CBC  CK  BRAIN NATRIURETIC PEPTIDE   HEMOGLOBIN A1C  HIV ANTIBODY (ROUTINE TESTING W REFLEX)  SULFONYLUREA HYPOGLYCEMICS PANEL, SERUM  CORTISOL  BETA-HYDROXYBUTYRIC ACID  GLUCOSE, CAPILLARY  GLUCOSE, CAPILLARY  TSH  GLUCOSE, CAPILLARY  GLUCOSE, CAPILLARY  ACTH STIMULATION, 3 TIME POINTS  CORTISOL-AM, BLOOD  GLUCOSE, CAPILLARY  ACTH  GLUCOSE, CAPILLARY  GLUCOSE, CAPILLARY  GLUCOSE, CAPILLARY  CBG MONITORING, ED  CBG MONITORING, ED  CBG MONITORING, ED  CBG MONITORING, ED  TROPONIN I (HIGH SENSITIVITY)     MDM  On arrival glucose was 77.  Plan for IV fluid.  Will repeat glucose in 30 minutes.  Patient likely will need admission for recurrent hypoglycemia given that she is not on any medications that would cause her hypoglycemia.  On review Victoza appears to have a half-life of approximately 13 hours -would think at this point since she has been holding it for the past 8 days that it would be out of her system and not causing her hypoglycemia.  On chart review does have a history of esophageal esophagitis.  Will give IV Zofran, IV fluids and IV Protonix  Patient complaining of some chest pain to nursing staff.  Will add on a troponin.  Repeat glucose 80.  Given concern for recurrent hypoglycemia and not on any diabetic medication consulted hospitalist for admission for further workup.     PROCEDURES:  Critical Care performed: yes  .Critical Care  Performed by: Corena Herter, MD Authorized by: Corena Herter, MD   Critical care provider statement:    Critical care time (minutes):  30   Critical care time was exclusive of:  Separately billable procedures and treating other patients   Critical care was necessary to treat or prevent imminent or life-threatening deterioration of the following conditions:  Endocrine crisis   Critical care was time spent personally by me on the following activities:  Development of treatment plan with patient or surrogate, discussions with consultants, evaluation of patient's  response to treatment, examination  of patient, ordering and review of laboratory studies, ordering and review of radiographic studies, ordering and performing treatments and interventions, pulse oximetry, re-evaluation of patient's condition and review of old charts   Patient's presentation is most consistent with acute presentation with potential threat to life or bodily function.   MEDICATIONS ORDERED IN ED: Medications  sodium chloride 0.9 % bolus 1,000 mL (0 mLs Intravenous Stopped 03/03/24 0300)  ondansetron (ZOFRAN) injection 4 mg (4 mg Intravenous Given 03/02/24 1959)  pantoprazole (PROTONIX) injection 40 mg (40 mg Intravenous Given 03/02/24 2001)  iohexol (OMNIPAQUE) 350 MG/ML injection 100 mL (100 mLs Intravenous Contrast Given 03/02/24 2221)  cosyntropin (CORTROSYN) injection 0.25 mg (0.25 mg Intravenous Given 03/05/24 0417)    FINAL CLINICAL IMPRESSION(S) / ED DIAGNOSES   Final diagnoses:  Hypoglycemia     Rx / DC Orders   ED Discharge Orders          Ordered    nystatin (MYCOSTATIN/NYSTOP) powder  4 times daily        03/06/24 0855    hydrocortisone (CORTEF) 5 MG tablet  Daily        03/06/24 0855    nicotine (NICODERM CQ - DOSED IN MG/24 HOURS) 21 mg/24hr patch  Daily        03/06/24 0855    Call MD for:       Comments: If you have low blood sugars (<65)   03/06/24 1012    Ambulatory referral to Endocrinology       Comments: Recurrent hypoglycemia in patient without diabetes and symptomatic.   03/05/24 1026    Ambulatory referral to Endocrinology       Comments: Patient with recurrent hypoglycemia. Does not have diabetes. No clear explanation.   03/05/24 1036    Ambulatory referral to Endocrinology       Comments: Recurrent hypoglycemia with unclear cause   03/05/24 1608             Note:  This document was prepared using Dragon voice recognition software and may include unintentional dictation errors.   Corena Herter, MD 03/02/24 1610    Corena Herter, MD 03/29/24 9604

## 2024-03-03 ENCOUNTER — Encounter: Payer: Self-pay | Admitting: Internal Medicine

## 2024-03-03 DIAGNOSIS — E162 Hypoglycemia, unspecified: Secondary | ICD-10-CM | POA: Diagnosis not present

## 2024-03-03 LAB — BETA-HYDROXYBUTYRIC ACID: Beta-Hydroxybutyric Acid: 0.06 mmol/L (ref 0.05–0.27)

## 2024-03-03 LAB — CBG MONITORING, ED
Glucose-Capillary: 110 mg/dL — ABNORMAL HIGH (ref 70–99)
Glucose-Capillary: 68 mg/dL — ABNORMAL LOW (ref 70–99)
Glucose-Capillary: 77 mg/dL (ref 70–99)
Glucose-Capillary: 99 mg/dL (ref 70–99)

## 2024-03-03 LAB — URINALYSIS, W/ REFLEX TO CULTURE (INFECTION SUSPECTED)
Bilirubin Urine: NEGATIVE
Glucose, UA: NEGATIVE mg/dL
Hgb urine dipstick: NEGATIVE
Ketones, ur: NEGATIVE mg/dL
Nitrite: NEGATIVE
Protein, ur: NEGATIVE mg/dL
Specific Gravity, Urine: 1.042 — ABNORMAL HIGH (ref 1.005–1.030)
pH: 6 (ref 5.0–8.0)

## 2024-03-03 LAB — BASIC METABOLIC PANEL
Anion gap: 6 (ref 5–15)
BUN: 14 mg/dL (ref 6–20)
CO2: 29 mmol/L (ref 22–32)
Calcium: 8.9 mg/dL (ref 8.9–10.3)
Chloride: 106 mmol/L (ref 98–111)
Creatinine, Ser: 0.67 mg/dL (ref 0.44–1.00)
GFR, Estimated: >60 mL/min (ref >60)
Glucose, Bld: 100 mg/dL — ABNORMAL HIGH (ref 70–99)
Potassium: 4.4 mmol/L (ref 3.5–5.1)
Sodium: 141 mmol/L (ref 135–145)

## 2024-03-03 LAB — GLUCOSE, CAPILLARY
Glucose-Capillary: 91 mg/dL (ref 70–99)
Glucose-Capillary: 105 mg/dL — ABNORMAL HIGH (ref 70–99)
Glucose-Capillary: 111 mg/dL — ABNORMAL HIGH (ref 70–99)

## 2024-03-03 LAB — HEMOGLOBIN A1C
Hgb A1c MFr Bld: 5.1 % (ref 4.8–5.6)
Mean Plasma Glucose: 99.67 mg/dL

## 2024-03-03 LAB — CORTISOL-AM, BLOOD: Cortisol - AM: 4.7 ug/dL — ABNORMAL LOW (ref 6.7–22.6)

## 2024-03-03 LAB — CORTISOL: Cortisol, Plasma: 4.5 ug/dL

## 2024-03-03 LAB — HIV ANTIBODY (ROUTINE TESTING W REFLEX): HIV Screen 4th Generation wRfx: NONREACTIVE

## 2024-03-03 MED ORDER — VENLAFAXINE HCL ER 75 MG PO CP24
225.0000 mg | ORAL_CAPSULE | Freq: Every day | ORAL | Status: DC
  Administered 2024-03-03 – 2024-03-06 (×4): 225 mg via ORAL
  Filled 2024-03-03 (×4): qty 3

## 2024-03-03 MED ORDER — ASPIRIN 81 MG PO TBEC
81.0000 mg | DELAYED_RELEASE_TABLET | Freq: Every day | ORAL | Status: DC
  Administered 2024-03-03 – 2024-03-06 (×4): 81 mg via ORAL
  Filled 2024-03-03 (×4): qty 1

## 2024-03-03 MED ORDER — AMLODIPINE BESYLATE 10 MG PO TABS
10.0000 mg | ORAL_TABLET | Freq: Every day | ORAL | Status: DC
  Administered 2024-03-03 – 2024-03-06 (×3): 10 mg via ORAL
  Filled 2024-03-03: qty 1
  Filled 2024-03-03: qty 2
  Filled 2024-03-03 (×2): qty 1

## 2024-03-03 MED ORDER — PANTOPRAZOLE SODIUM 40 MG PO TBEC
40.0000 mg | DELAYED_RELEASE_TABLET | Freq: Every day | ORAL | Status: DC
  Administered 2024-03-03 – 2024-03-06 (×4): 40 mg via ORAL
  Filled 2024-03-03 (×4): qty 1

## 2024-03-03 MED ORDER — NYSTATIN 100000 UNIT/GM EX POWD
Freq: Four times a day (QID) | CUTANEOUS | Status: DC
  Filled 2024-03-03: qty 15

## 2024-03-03 MED ORDER — HYDROXYZINE HCL 25 MG PO TABS
25.0000 mg | ORAL_TABLET | Freq: Two times a day (BID) | ORAL | Status: DC
  Administered 2024-03-03 – 2024-03-06 (×8): 25 mg via ORAL
  Filled 2024-03-03 (×8): qty 1

## 2024-03-03 MED ORDER — CYCLOBENZAPRINE HCL 10 MG PO TABS
10.0000 mg | ORAL_TABLET | Freq: Three times a day (TID) | ORAL | Status: DC
  Administered 2024-03-03 – 2024-03-06 (×10): 10 mg via ORAL
  Filled 2024-03-03 (×11): qty 1

## 2024-03-03 MED ORDER — UMECLIDINIUM BROMIDE 62.5 MCG/ACT IN AEPB
1.0000 | INHALATION_SPRAY | Freq: Every day | RESPIRATORY_TRACT | Status: DC
  Administered 2024-03-03 – 2024-03-06 (×4): 1 via RESPIRATORY_TRACT
  Filled 2024-03-03: qty 7

## 2024-03-03 MED ORDER — MOMETASONE FURO-FORMOTEROL FUM 200-5 MCG/ACT IN AERO
2.0000 | INHALATION_SPRAY | Freq: Two times a day (BID) | RESPIRATORY_TRACT | Status: DC
  Administered 2024-03-03 – 2024-03-06 (×8): 2 via RESPIRATORY_TRACT
  Filled 2024-03-03: qty 8.8

## 2024-03-03 MED ORDER — TIOTROPIUM BROMIDE MONOHYDRATE 18 MCG IN CAPS: 18.0000 ug | ORAL_CAPSULE | Freq: Every day | RESPIRATORY_TRACT | Status: DC

## 2024-03-03 NOTE — ED Notes (Signed)
 Pt given lunch tray.

## 2024-03-03 NOTE — TOC CM/SW Note (Signed)
 Transition of Care [facility]) - Inpatient Brief Assessment   Patient Details  Name: ERSEL WADLEIGH MRN: [id number] Date of Birth: 09/01/1966  Transition of Care [facility]) CM/SW Contact:    Chapman Fitch, RN Phone Number: 03/03/2024, 1:59 PM   Clinical Narrative:   Transition of Care (TOC) Screening Note   Patient Details  Name: ELLIOTTE MARSALIS Date of Birth: 1966/12/23   Transition of Care [facility]) CM/SW Contact:    Chapman Fitch, RN Phone Number: 03/03/2024, 1:59 PM    Transition of Care Department [facility]) has reviewed patient and no TOC needs have been identified at this time. If new patient transition needs arise, please place a TOC consult.    Transition of Care Asessment: Insurance and Status: Insurance coverage has been reviewed Patient has primary care physician: Yes     Prior/Current Home Services: No current home services Social Drivers of Health Review: SDOH reviewed no interventions necessary Readmission risk has been reviewed: Yes Transition of care needs: no transition of care needs at this time

## 2024-03-03 NOTE — Plan of Care (Signed)

## 2024-03-03 NOTE — Progress Notes (Signed)
  Progress Note   Patient: Nicole Wyatt DGL:[id number] DOB: [birth year] DOA: 03/02/2024     0 DOS: the patient was seen and examined on 03/03/2024   Brief hospital course:  Assessment and Plan:  Hypoglycemia  Etiology remains unclear. Victoza was stopped about 8 days prior to her presentation. Since her initial Had CBG of 39 her lowest BG has been 68. Will continue to hold victoza and monitor CBG.  F/u AM cortisol, c-peptide, sulfonylurea/hypoglycemics panel, proinsulin  Esophagitis Continue protonix   Sarcoidosis  of lung  Stable  COPD Continue home inhalers  Chronic diastolic HF  Appears compensated.  Hold lsasix   Essential hypertension -Continue amlodipine    Tobacco use disorder  -Nicotine patch   Depression with anxiety -Continue home medications   OSA (obstructive sleep apnea) -CPAP   Obesity, Class III, BMI 40-49.9 (morbid obesity) (HCC): Body weight 120.7 kg, BMI 41.66 -Encourage losing weight -Exercise and healthy diet -hold Victoza     Subjective: No acute complaints.   Physical Exam: Vitals:   03/03/24 1000 03/03/24 1245 03/03/24 1330 03/03/24 1543  BP: 114/62 118/65 116/62 117/64  Pulse: 75 75 72 71  Resp: 18 17 18    Temp:   98.3 F (36.8 C) 98.2 F (36.8 C)  TempSrc:   Oral Oral  SpO2: 96% 95% 93% 98%  Weight:      Height:       Physical Exam  Constitutional: In no distress.  Cardiovascular: Normal rate, regular rhythm. No lower extremity edema  Pulmonary: Non labored breathing on room air, no wheezing or rales.  No lower extremity edema Abdominal: Soft. Normal bowel sounds. Non distended and non tender Musculoskeletal: Normal range of motion.     Neurological: Alert and oriented to person, place, and time. Non focal  Skin: Skin is warm and dry.   Data Reviewed:     Latest Ref Rng & Units 03/02/2024    7:40 PM 08/10/2023    7:27 PM 02/28/2023    1:28 PM  CBC  WBC 4.0 - 10.5 K/uL 5.9  5.3  5.8   Hemoglobin 12.0 - 15.0 g/dL 51.8   84.1  66.0   Hematocrit 36.0 - 46.0 % 43.5  48.5  45.5   Platelets 150 - 400 K/uL 281  275  296    CBG (last 3)  Recent Labs    03/03/24 1145 03/03/24 1333 03/03/24 1826  GLUCAP 68* 91 105*      Latest Ref Rng & Units 03/03/2024    6:10 AM 03/02/2024    7:40 PM 08/10/2023    7:27 PM  BMP  Glucose 70 - 99 mg/dL 630  95  88   BUN 6 - 20 mg/dL 14  16  14    Creatinine 0.44 - 1.00 mg/dL 1.60  1.09  3.23   Sodium 135 - 145 mmol/L 141  139  140   Potassium 3.5 - 5.1 mmol/L 4.4  4.1  3.5   Chloride 98 - 111 mmol/L 106  103  102   CO2 22 - 32 mmol/L 29  27  26    Calcium 8.9 - 10.3 mg/dL 8.9  9.3  9.2      Family Communication: Daughter at bedside   Disposition: Status is: Observation The patient remains OBS appropriate and will d/c before 2 midnights.  Planned Discharge Destination: Home with Home Health    Time spent: 35 minutes  Author: Marolyn Haller, MD 03/03/2024 6:30 PM  For on call review [email or web address].

## 2024-03-03 NOTE — Plan of Care (Signed)
  Problem: Education: Goal: Knowledge of General Education information will improve Description: Including pain rating scale, medication(s)/side effects and non-pharmacologic comfort measures 03/03/2024 1938 by Risa Grill, RN Outcome: Progressing 03/03/2024 1937 by Risa Grill, RN Outcome: Progressing   Problem: Health Behavior/Discharge Planning: Goal: Ability to manage health-related needs will improve 03/03/2024 1938 by Risa Grill, RN Outcome: Progressing 03/03/2024 1937 by Risa Grill, RN Outcome: Progressing   Problem: Clinical Measurements: Goal: Ability to maintain clinical measurements within normal limits will improve 03/03/2024 1938 by Risa Grill, RN Outcome: Progressing 03/03/2024 1937 by Risa Grill, RN Outcome: Progressing Goal: Will remain free from infection 03/03/2024 1938 by Risa Grill, RN Outcome: Progressing 03/03/2024 1937 by Risa Grill, RN Outcome: Progressing Goal: Diagnostic test results will improve 03/03/2024 1938 by Risa Grill, RN Outcome: Progressing 03/03/2024 1937 by Risa Grill, RN Outcome: Progressing Goal: Respiratory complications will improve 03/03/2024 1938 by Risa Grill, RN Outcome: Progressing 03/03/2024 1937 by Risa Grill, RN Outcome: Progressing Goal: Cardiovascular complication will be avoided 03/03/2024 1938 by Risa Grill, RN Outcome: Progressing 03/03/2024 1937 by Risa Grill, RN Outcome: Progressing   Problem: Activity: Goal: Risk for activity intolerance will decrease 03/03/2024 1938 by Risa Grill, RN Outcome: Progressing 03/03/2024 1937 by Risa Grill, RN Outcome: Progressing   Problem: Nutrition: Goal: Adequate nutrition will be maintained 03/03/2024 1938 by Risa Grill, RN Outcome:  Progressing 03/03/2024 1937 by Risa Grill, RN Outcome: Progressing   Problem: Coping: Goal: Level of anxiety will decrease 03/03/2024 1938 by Risa Grill, RN Outcome: Progressing 03/03/2024 1937 by Risa Grill, RN Outcome: Progressing   Problem: Elimination: Goal: Will not experience complications related to bowel motility 03/03/2024 1938 by Risa Grill, RN Outcome: Progressing 03/03/2024 1937 by Risa Grill, RN Outcome: Progressing Goal: Will not experience complications related to urinary retention 03/03/2024 1938 by Risa Grill, RN Outcome: Progressing 03/03/2024 1937 by Risa Grill, RN Outcome: Progressing   Problem: Pain Managment: Goal: General experience of comfort will improve and/or be controlled 03/03/2024 1938 by Risa Grill, RN Outcome: Progressing 03/03/2024 1937 by Risa Grill, RN Outcome: Progressing   Problem: Safety: Goal: Ability to remain free from injury will improve 03/03/2024 1938 by Risa Grill, RN Outcome: Progressing 03/03/2024 1937 by Risa Grill, RN Outcome: Progressing   Problem: Skin Integrity: Goal: Risk for impaired skin integrity will decrease 03/03/2024 1938 by Risa Grill, RN Outcome: Progressing 03/03/2024 1937 by Risa Grill, RN Outcome: Progressing

## 2024-03-03 NOTE — ED Notes (Signed)
 Pt given cup of OJ for CBG 68.

## 2024-03-04 DIAGNOSIS — E162 Hypoglycemia, unspecified: Secondary | ICD-10-CM | POA: Diagnosis not present

## 2024-03-04 LAB — BASIC METABOLIC PANEL
Anion gap: 10 (ref 5–15)
BUN: 14 mg/dL (ref 6–20)
CO2: 27 mmol/L (ref 22–32)
Calcium: 8.7 mg/dL — ABNORMAL LOW (ref 8.9–10.3)
Chloride: 102 mmol/L (ref 98–111)
Creatinine, Ser: 0.58 mg/dL (ref 0.44–1.00)
GFR, Estimated: >60 mL/min (ref >60)
Glucose, Bld: 96 mg/dL (ref 70–99)
Potassium: 3.8 mmol/L (ref 3.5–5.1)
Sodium: 139 mmol/L (ref 135–145)

## 2024-03-04 LAB — GLUCOSE, CAPILLARY
Glucose-Capillary: 105 mg/dL — ABNORMAL HIGH (ref 70–99)
Glucose-Capillary: 111 mg/dL — ABNORMAL HIGH (ref 70–99)
Glucose-Capillary: 111 mg/dL — ABNORMAL HIGH (ref 70–99)
Glucose-Capillary: 60 mg/dL — ABNORMAL LOW (ref 70–99)
Glucose-Capillary: 65 mg/dL — ABNORMAL LOW (ref 70–99)
Glucose-Capillary: 75 mg/dL (ref 70–99)
Glucose-Capillary: 80 mg/dL (ref 70–99)
Glucose-Capillary: 89 mg/dL (ref 70–99)
Glucose-Capillary: 92 mg/dL (ref 70–99)

## 2024-03-04 LAB — INSULIN AND C-PEPTIDE, SERUM
C-Peptide: 7.2 ng/mL — ABNORMAL HIGH (ref 1.1–4.4)
Insulin: 46.6 u[IU]/mL — ABNORMAL HIGH (ref 2.6–24.9)

## 2024-03-04 LAB — TSH: TSH: 0.992 u[IU]/mL (ref 0.350–4.500)

## 2024-03-04 LAB — CORTISOL-AM, BLOOD: Cortisol - AM: 2.7 ug/dL — ABNORMAL LOW (ref 6.7–22.6)

## 2024-03-04 MED ORDER — COSYNTROPIN 0.25 MG IJ SOLR
0.2500 mg | Freq: Once | INTRAMUSCULAR | Status: AC
  Administered 2024-03-05: 0.25 mg via INTRAVENOUS
  Filled 2024-03-04: qty 0.25

## 2024-03-04 NOTE — Progress Notes (Signed)
 Hypoglycemic Event  CBG: 60  Treatment: 8 oz juice/soda  Symptoms: shaky and tired  Follow-up CBG: Time:0926 CBG Result:111  Possible Reasons for Event:unsure why, "just does that"  Comments/MD notified:None    Retia Passe

## 2024-03-04 NOTE — Progress Notes (Signed)
 Hypoglycemic Event  CBG: 65  Treatment: 4 oz juice/soda  Symptoms: shaky and tired  Follow-up CBG: Time:12:50 pm CBG Result:80  Possible Reasons for Event: Other: unsure  Comments/MD notified:MD Montez Morita notified    Retia Passe

## 2024-03-04 NOTE — Care Management Obs Status (Signed)
 MEDICARE OBSERVATION STATUS NOTIFICATION   Patient Details  Name: Nicole Wyatt MRN: [id number] Date of Birth: 15-Apr-1966   Medicare Observation Status Notification Given:  Orland Dec, CMA 03/04/2024, 9:05 AM

## 2024-03-04 NOTE — Plan of Care (Signed)

## 2024-03-04 NOTE — Progress Notes (Addendum)
 Progress Note   Patient: Nicole Wyatt:[id number] DOB: Jan 07, 1966 DOA: 03/02/2024     0 DOS: the patient was seen and examined on 03/04/2024   Brief hospital course:  Assessment and Plan:  Hypoglycemia  Etiology remains unclear. Victoza was stopped about 8 days prior to her presentation. Since her initial CBG of 39, her lowest BG has been 60. Patient had episode overnight where she was "shaky" and diaphoretic. She corrected before her blood sugar was taken. She did have a low blood glucose this Am of 60 and 65. Her AM cortisol level was suggestive of adrenal insufficiency.   -Repeat AM cortisol, ACTH stim test -F/u proinsulin and hypoglyemic panel screen  -Patient may require further  F/u AM cortisol, c-peptide, sulfonylurea/hypoglycemics panel, proinsulin  Esophagitis Continue protonix   Sarcoidosis  of lung  Stable  COPD Continue home inhalers  Chronic diastolic HF  Appears compensated.  Hold lsasix   Essential hypertension -Continue amlodipine    Tobacco use disorder  -Nicotine patch   Depression with anxiety -Continue home medications   OSA (obstructive sleep apnea) -CPAP   Obesity, Class III, BMI 40-49.9 (morbid obesity) (HCC): Body weight 120.7 kg, BMI 41.66 -Encourage losing weight -Exercise and healthy diet -hold Victoza     Subjective: Patient had an episode overnight where she became "shaky" and sweaty. She did not have blood sugar checked at this time, and her symptoms improved with drinking juice.   Physical Exam: Vitals:   03/04/24 0427 03/04/24 0500 03/04/24 0832 03/04/24 1538  BP: 129/66  125/71 110/63  Pulse: (!) 56  70 67  Resp: 18  20 18   Temp: 98.5 F (36.9 C)  97.7 F (36.5 C) 98 F (36.7 C)  TempSrc:   Oral Oral  SpO2: 99%  100% 100%  Weight:  124.7 kg    Height:        Constitutional: In no distress, anxious  Cardiovascular: Normal rate, regular rhythm. No lower extremity edema  Pulmonary: Non labored breathing on room  air, no wheezing or rales.  No lower extremity edema Abdominal: Soft. Normal bowel sounds. Non distended and non tender Musculoskeletal: Normal range of motion.     Neurological: Alert and oriented to person, place, and time. Non focal  Skin: Skin is warm and dry.    Data Reviewed:     Latest Ref Rng & Units 03/02/2024    7:40 PM 08/10/2023    7:27 PM 02/28/2023    1:28 PM  CBC  WBC 4.0 - 10.5 K/uL 5.9  5.3  5.8   Hemoglobin 12.0 - 15.0 g/dL 81.1  91.4  78.2   Hematocrit 36.0 - 46.0 % 43.5  48.5  45.5   Platelets 150 - 400 K/uL 281  275  296    CBG (last 3)  Recent Labs    03/04/24 0924 03/04/24 1220 03/04/24 1251  GLUCAP 111* 65* 80      Latest Ref Rng & Units 03/04/2024    5:22 AM 03/03/2024    6:10 AM 03/02/2024    7:40 PM  BMP  Glucose 70 - 99 mg/dL 96  956  95   BUN 6 - 20 mg/dL 14  14  16    Creatinine 0.44 - 1.00 mg/dL 2.13  0.86  5.78   Sodium 135 - 145 mmol/L 139  141  139   Potassium 3.5 - 5.1 mmol/L 3.8  4.4  4.1   Chloride 98 - 111 mmol/L 102  106  103  CO2 22 - 32 mmol/L 27  29  27    Calcium 8.9 - 10.3 mg/dL 8.7  8.9  9.3      Family Communication: Daughter on phone   Disposition: Status is: Inpatient  The patient will require care spanning > 2 midnights and should be moved to inpatient because: she continues to have recurrent hypoglycemic episodes requiring further investigation.   Planned Discharge Destination: Home and Home with Home Health    Time spent: 35 minutes  Author: Marolyn Haller, MD 03/04/2024 4:02 PM  For on call review [email or web address].

## 2024-03-05 DIAGNOSIS — E162 Hypoglycemia, unspecified: Secondary | ICD-10-CM | POA: Diagnosis not present

## 2024-03-05 LAB — CORTISOL-AM, BLOOD: Cortisol - AM: 18.1 ug/dL (ref 6.7–22.6)

## 2024-03-05 LAB — ACTH STIMULATION, 3 TIME POINTS
Cortisol, 30 Min: 18.4 ug/dL
Cortisol, 60 Min: 23 ug/dL
Cortisol, Base: 5.8 ug/dL

## 2024-03-05 LAB — GLUCOSE, CAPILLARY
Glucose-Capillary: 101 mg/dL — ABNORMAL HIGH (ref 70–99)
Glucose-Capillary: 102 mg/dL — ABNORMAL HIGH (ref 70–99)
Glucose-Capillary: 106 mg/dL — ABNORMAL HIGH (ref 70–99)
Glucose-Capillary: 107 mg/dL — ABNORMAL HIGH (ref 70–99)
Glucose-Capillary: 123 mg/dL — ABNORMAL HIGH (ref 70–99)
Glucose-Capillary: 84 mg/dL (ref 70–99)

## 2024-03-05 MED ORDER — HYDROCORTISONE 5 MG PO TABS
5.0000 mg | ORAL_TABLET | Freq: Every day | ORAL | Status: DC
  Administered 2024-03-05 – 2024-03-06 (×2): 5 mg via ORAL
  Filled 2024-03-05 (×2): qty 1

## 2024-03-05 NOTE — Progress Notes (Signed)
 BP slightly low, 105/55, heart rate 56. Order received from Dr Montez Morita to hold norvasc

## 2024-03-05 NOTE — TOC Progression Note (Signed)
 Transition of Care [facility]) - Progression Note    Patient Details  Name: Nicole Wyatt MRN: [id number] Date of Birth: [birth year]  Transition of Care [facility]) CM/SW Contact  Hetty Ely, RN Phone Number: 03/05/2024, 4:05 PM  Clinical Narrative:  Called Endocrinologist in  [facility], [phone]  spoke with [place] was informed to have the MD to place referral in EPIC and they will review and contact patient with appointment. Currently new patient appointments are in June, however they do review and prioritize. MD and Nurse aware.          Expected Discharge Plan and Services                                               Social Determinants of Health (SDOH) Interventions SDOH Screenings   Food Insecurity: No Food Insecurity (03/03/2024)  Housing: Low Risk  (03/03/2024)  Transportation Needs: No Transportation Needs (03/03/2024)  Utilities: Not At Risk (03/03/2024)  Alcohol Screen: Low Risk  (01/06/2019)  Depression (PHQ2-9): Low Risk  (01/06/2019)  Tobacco Use: High Risk (03/03/2024)    Readmission Risk Interventions    07/06/2022    1:32 PM  Readmission Risk Prevention Plan  Post Dischage Appt Complete  Medication Screening Complete  Transportation Screening Complete

## 2024-03-05 NOTE — Progress Notes (Signed)
 Progress Note   Patient: Nicole Wyatt NUU:[id number] DOB: [birth year] DOA: 03/02/2024     0 DOS: the patient was seen and examined on 03/05/2024   Brief hospital course:  Assessment and Plan:  Hypoglycemia  Etiology remains unclear. Victoza was stopped about 8 days prior to her presentation. Since her initial CBG of 39, her lowest BG has been 60. Patient is now without any hypoglycemic episodes.  Her AM cortisol level was suggestive of adrenal insufficiency. She underwent ACTH stim test which was not concerning for adrenal insufficiency.  Her insulin and Cpeptide levels were drawn some time after her hypoglycemic episode, unclear if able to draw appropriate conclusions because of this. She most likely had received glucose load by this time.   Patient will need further testing in the outpatient setting with endocrinology  -Given initial cortisol level drawn at appropriate time decreased to <3, will start low dose cortef until patient able to follow up with endocrinology, multiple referrals placed to try and get patient in sooner.  -F/u proinsulin and hypoglyemic panel screen, proinsulin  Esophagitis Continue protonix   Sarcoidosis  of lung  Stable  COPD Continue home inhalers  Chronic diastolic HF  Appears compensated.  Hold lsasix   Essential hypertension -Continue amlodipine    Tobacco use disorder  -Nicotine patch   Depression with anxiety -Continue home medications   OSA (obstructive sleep apnea) -CPAP   Obesity, Class III, BMI 40-49.9 (morbid obesity) (HCC): Body weight 120.7 kg, BMI 41.66 -Encourage losing weight -Exercise and healthy diet -hold Victoza     Subjective: Patient without any hypoglycemic symptoms overnight. She has some anxiety around her blood sugar possibly going low overnight.   Physical Exam: Vitals:   03/05/24 0325 03/05/24 0327 03/05/24 0730 03/05/24 1401  BP: (!) 112/58  (!) 104/55 119/62  Pulse: 65  (!) 56 71  Resp: 18  16 18    Temp: (!) 97.4 F (36.3 C)  97.8 F (36.6 C) 98.2 F (36.8 C)  TempSrc:   Oral Oral  SpO2: 100%  100% 94%  Weight:  123.2 kg    Height:         Constitutional: In no distress.  Cardiovascular: Normal rate, regular rhythm. No lower extremity edema  Pulmonary: Non labored breathing on room air, no wheezing or rales.  No lower extremity edema Abdominal: Soft. Normal bowel sounds. Non distended and non tender Musculoskeletal: Normal range of motion.     Neurological: Alert and oriented to person, place, and time. Non focal  Skin: Skin is warm and dry.   Data Reviewed:     Latest Ref Rng & Units 03/02/2024    7:40 PM 08/10/2023    7:27 PM 02/28/2023    1:28 PM  CBC  WBC 4.0 - 10.5 K/uL 5.9  5.3  5.8   Hemoglobin 12.0 - 15.0 g/dL 34.7  42.5  95.6   Hematocrit 36.0 - 46.0 % 43.5  48.5  45.5   Platelets 150 - 400 K/uL 281  275  296    CBG (last 3)  Recent Labs    03/05/24 0733 03/05/24 1137 03/05/24 1616  GLUCAP 102* 107* 84      Latest Ref Rng & Units 03/04/2024    5:22 AM 03/03/2024    6:10 AM 03/02/2024    7:40 PM  BMP  Glucose 70 - 99 mg/dL 96  387  95   BUN 6 - 20 mg/dL 14  14  16    Creatinine 0.44 - 1.00  mg/dL 4.54  0.98  1.19   Sodium 135 - 145 mmol/L 139  141  139   Potassium 3.5 - 5.1 mmol/L 3.8  4.4  4.1   Chloride 98 - 111 mmol/L 102  106  103   CO2 22 - 32 mmol/L 27  29  27    Calcium 8.9 - 10.3 mg/dL 8.7  8.9  9.3      Family Communication: Daughter on phone   Disposition: Status is: Inpatient  The patient will require care inpatient stay as work up for her hypoglycemic episodes continued and her inability to timely establish care with endocrinology.   Planned Discharge Destination: Home     Time spent: 35 minutes  Author: Marolyn Haller, MD 03/05/2024 6:16 PM  For on call review [email or web address].

## 2024-03-05 NOTE — Hospital Course (Signed)
 Ask smoking history?   Sarcoidosis history? Any steroids?

## 2024-03-05 NOTE — Plan of Care (Signed)

## 2024-03-06 ENCOUNTER — Other Ambulatory Visit: Payer: Self-pay

## 2024-03-06 DIAGNOSIS — E162 Hypoglycemia, unspecified: Secondary | ICD-10-CM | POA: Diagnosis not present

## 2024-03-06 LAB — GLUCOSE, CAPILLARY
Glucose-Capillary: 96 mg/dL (ref 70–99)
Glucose-Capillary: 94 mg/dL (ref 70–99)
Glucose-Capillary: 103 mg/dL — ABNORMAL HIGH (ref 70–99)

## 2024-03-06 LAB — ACTH: C206 ACTH: 12 pg/mL (ref 7.2–63.3)

## 2024-03-06 MED ORDER — HYDROCORTISONE 5 MG PO TABS
5.0000 mg | ORAL_TABLET | Freq: Every day | ORAL | 0 refills | Status: AC
  Filled 2024-03-06: qty 30, 30d supply, fill #0

## 2024-03-06 MED ORDER — NICOTINE 21 MG/24HR TD PT24
21.0000 mg | MEDICATED_PATCH | Freq: Every day | TRANSDERMAL | 0 refills | Status: AC
  Filled 2024-03-06: qty 28, 28d supply, fill #0

## 2024-03-06 MED ORDER — NYSTATIN 100000 UNIT/GM EX POWD
Freq: Four times a day (QID) | CUTANEOUS | 0 refills | Status: AC
  Filled 2024-03-06: qty 60, 30d supply, fill #0

## 2024-03-06 NOTE — Plan of Care (Signed)

## 2024-03-06 NOTE — Discharge Instructions (Signed)
 You will need to see endocrinology and your PCP within 1 to 2 weeks.   Please continue the medication cortef until you see your endocrinologist/PCP. Please continue omeprazole as well.

## 2024-03-10 LAB — SULFONYLUREA HYPOGLYCEMICS PANEL, SERUM
Acetohexamide: NEGATIVE ug/mL (ref 20–60)
Chlorpropamide: NEGATIVE ug/mL (ref 75–250)
Glimepiride: NEGATIVE ng/mL (ref 80–250)
Glipizide: NEGATIVE ng/mL (ref 200–1000)
Glyburide: NEGATIVE ng/mL
Nateglinide: NEGATIVE ng/mL
Repaglinide: NEGATIVE ng/mL
Tolazamide: NEGATIVE ug/mL
Tolbutamide: NEGATIVE ug/mL (ref 40–100)

## 2024-03-10 NOTE — Discharge Summary (Signed)
 Physician Discharge Summary   Patient: Nicole Wyatt MRN: [id number] DOB: 1966/05/18  Admit date:     03/02/2024  Discharge date: 03/06/2024  Discharge Physician: Marolyn Haller   PCP: [facility]   Recommendations at discharge:    F/u hypoglycemia labs, determine if steroids (hydrocortisone) should be continued.   Discharge Diagnoses: Principal Problem:   Hypoglycemia Active Problems:   Abdominal pain   Erosive esophagitis   Sarcoidosis of lung (HCC)   Chronic obstructive pulmonary disease (HCC)   Chronic diastolic CHF (congestive heart failure) (HCC)   Essential hypertension   Tobacco abuse   Depression with anxiety   OSA (obstructive sleep apnea)   Obesity, Class III, BMI 40-49.9 (morbid obesity) (HCC)  Resolved Problems:   * No resolved hospital problems. *  Hospital Course: From HPI   Nicole Wyatt is a 58 y.o. female with medical history significant for class 3 obesity on Victoza, hypertension, hyperlipidemia, COPD on 2 L oxygen, dCHF, erosive esophagitis, depression with anxiety, bipolar, OSA on CPAP, sarcoidosis of lung, pulmonary hypertension, kidney stone, smoker, who presents with hypoglycemia and abdominal pain.   Per patient and her daughter at the bedside, patient has been having hypoglycemia in the past several days.    She does not have diabetes.  She is taking Victoza for weight loss, but this was discontinued ~8 d prior with her primary care physician given issues with her glucose. She continues to have low blood sugar. On Thursday, patient had blood sugar of 34. States that EMS gave her oral glucose and she drank sugar water, her blood sugar came up immediately, but then continued to drop back down.  On admission her blood sugar dropped to 40.  They called her PCP who recommended patient to come to the emergency room for further evaluation and treatment. States that she ate a hamburger and a hot dog approximately 2 hours ago prior to  arrival. Her blood sugar is 88 by BMP in ED. The repeated blood sugar is 77.  Patient stated that she has poor appetite and decreased oral intake due to Victoza use.  Patient did have BG level down to 60 on hospital day 2 and was also symptomatic during this time (noting anxiousness and sweating) and had improvement in her symptoms with correction of her BG meeting Whipple's triad. She had abdominal imaging which was notable for stable R adrenal adenoma. Her insulin levels and c peptide levels were noted to be elevated but patient's labs were drawn a few hours after her hypoglycemic episode and after correction. Her hypoglycemic panel screen and proinsulin levels were pending by the time of discharge. She did have low AM cortisol 2.7, the other value was drawn in the afternoon and 4.7. Her ACTH stim test and random ACTH level were WNL. Because of her testing was equivocal, patient was started on low dose cortef and instructed to have close follow up with endocrinology.   Given patient had only hypoglycemia as a symptom of possible adrenal insufficiency it is possible that she had a secondary etiology (ie., isolated ACTH deficiency).         Consultants: Referral sent for endocrinology  Procedures performed: None  Disposition: Home Diet recommendation:  Regular diet DISCHARGE MEDICATION: Allergies as of 03/06/2024       Reactions   Bee Venom Anaphylaxis   Percocet [oxycodone-acetaminophen] Nausea And Vomiting   Hallucinations    Ciprofloxacin Nausea Only   Penicillins Diarrhea, Rash   Did it involve swelling  of the face/tongue/throat, SOB, or low BP? No Did it involve sudden or severe rash/hives, skin peeling, or any reaction on the inside of your mouth or nose? No Did you need to seek medical attention at a hospital or doctor's office? No When did it last happen?      4-5 years If all above answers are "NO", may proceed with cephalosporin use.        Medication List     STOP  taking these medications    EPINEPHrine 0.3 mg/0.3 mL Soaj injection Commonly known as: EPI-PEN   ibuprofen 600 MG tablet Commonly known as: ADVIL   Victoza 18 MG/3ML Sopn Generic drug: liraglutide       TAKE these medications    albuterol 108 (90 Base) MCG/ACT inhaler Commonly known as: VENTOLIN HFA Inhale 2 puffs into the lungs every 4 (four) hours as needed for wheezing or shortness of breath.   amLODipine 10 MG tablet Commonly known as: NORVASC Take 10 mg by mouth daily.   aspirin EC 81 MG tablet Take 1 tablet (81 mg total) by mouth 2 (two) times daily. For DVT prophylaxis for 30 days after surgery. What changed:  when to take this additional instructions   budesonide-formoterol 160-4.5 MCG/ACT inhaler Commonly known as: SYMBICORT Inhale 2 puffs into the lungs 2 (two) times daily.   carisoprodol 350 MG tablet Commonly known as: SOMA Take 350 mg by mouth 4 (four) times daily as needed.   cyclobenzaprine 10 MG tablet Commonly known as: FLEXERIL Take 10 mg by mouth 3 (three) times daily.   fluticasone 50 MCG/ACT nasal spray Commonly known as: FLONASE Place 2 sprays into both nostrils daily. What changed:  when to take this reasons to take this   furosemide 40 MG tablet Commonly known as: LASIX Take 1 tablet (40 mg total) by mouth daily as needed for fluid. What changed: reasons to take this   hydrocortisone 5 MG tablet Commonly known as: CORTEF Take 1 tablet (5 mg total) by mouth daily.   hydrOXYzine 25 MG tablet Commonly known as: ATARAX Take 25 mg by mouth 2 (two) times daily.   meloxicam 15 MG tablet Commonly known as: MOBIC Take 1 tablet (15 mg total) by mouth daily.   nicotine 21 mg/24hr patch Commonly known as: NICODERM CQ - dosed in mg/24 hours Place 1 patch (21 mg total) onto the skin daily.   nystatin powder Commonly known as: MYCOSTATIN/NYSTOP Apply topically 4 (four) times daily.   omeprazole 40 MG capsule Commonly known as:  PRILOSEC TAKE (1) CAPSULE TWICE DAILY BEFORE MEALS.   ondansetron 4 MG disintegrating tablet Commonly known as: ZOFRAN-ODT Take 1 tablet (4 mg total) by mouth every 8 (eight) hours as needed for nausea or vomiting.   phentermine 37.5 MG tablet Commonly known as: ADIPEX-P Take 37.5 mg by mouth daily.   Spiriva HandiHaler 18 MCG inhalation capsule Generic drug: tiotropium Place 18 mcg into inhaler and inhale daily.   venlafaxine 75 MG tablet Commonly known as: EFFEXOR Take 225 mg by mouth daily.        Follow-up Information     [facility] Follow up on 03/16/2024.   Specialty: General Practice Why: Go at  2:20pm. Contact information: [address] [place] [address] [phone]         [facility] Endocrinology Follow up in 1 week(s).   Specialty: Endocrinology Contact information: [address] [place], [address] [place] [address] [phone]  Discharge Exam: Filed Weights   03/04/24 0500 03/05/24 0327 03/06/24 0416  Weight: 124.7 kg 123.2 kg 121.5 kg    Constitutional: In no distress.  Cardiovascular: Normal rate, regular rhythm. No lower extremity edema  Pulmonary: Non labored breathing on room air, no wheezing or rales.  No lower extremity edema Abdominal: Soft. Normal bowel sounds. Non distended and non tender Musculoskeletal: Normal range of motion.     Neurological: Alert and oriented to person, place, and time. Non focal  Skin: Skin is warm and dry.    Condition at discharge: good  The results of significant diagnostics from this hospitalization (including imaging, microbiology, ancillary and laboratory) are listed below for reference.   Imaging Studies: CT ABDOMEN PELVIS W CONTRAST Result Date: 03/03/2024 CLINICAL DATA:  Acute abdominal pain. EXAM: CT ABDOMEN AND PELVIS WITH CONTRAST TECHNIQUE: Multidetector CT imaging of the abdomen and pelvis was performed using the  standard protocol following bolus administration of intravenous contrast. RADIATION DOSE REDUCTION: This exam was performed according to the departmental dose-optimization program which includes automated exposure control, adjustment of the mA and/or kV according to patient size and/or use of iterative reconstruction technique. CONTRAST:  OMNIPAQUE IOHEXOL 350 MG/ML SOLN COMPARISON:  CT abdomen and pelvis 08/10/2023 FINDINGS: Lower chest: There is atelectasis in the lung bases. Hepatobiliary: The liver is moderately enlarged, unchanged. No focal liver abnormality is seen. No gallstones, gallbladder wall thickening, or biliary dilatation. Pancreas: Unremarkable. No pancreatic ductal dilatation or surrounding inflammatory changes. Spleen: Normal in size without focal abnormality. Adrenals/Urinary Tract: There is a 2.2 by 1.5 cm right adrenal nodule previously characterized as adenoma, unchanged from prior. The left adrenal gland is within normal limits. Bladder and bilateral kidneys are within normal limits. Stomach/Bowel: Stomach is within normal limits. Appendix appears normal. No evidence of bowel wall thickening, distention, or inflammatory changes. Vascular/Lymphatic: No significant vascular findings are present. No enlarged abdominal or pelvic lymph nodes. Reproductive: Status post hysterectomy. No adnexal masses. Other: There is a small fat containing umbilical hernia. There is no ascites. Musculoskeletal: Left hip arthroplasty is present. Degenerative changes affect the right hip. IMPRESSION: 1. No acute localizing process in the abdomen or pelvis. 2. Stable hepatomegaly. 3. Stable right adrenal adenoma. 4. Small fat containing umbilical hernia. Electronically Signed   By: Darliss Cheney M.D.   On: 03/03/2024 00:36    Microbiology: Results for orders placed or performed during the hospital encounter of 08/10/23  Urine Culture     Status: Abnormal   Collection Time: 08/10/23 11:31 PM   Specimen:  Urine, Clean Catch  Result Value Ref Range Status   Specimen Description   Final    URINE, CLEAN CATCH Performed at [facility], [address] [place], [place] [address]    Special Requests   Final    NONE Performed at [facility], [address], [place], [place] [address]    Culture MULTIPLE SPECIES PRESENT, SUGGEST RECOLLECTION (A)  Final   Report Status 08/12/2023 FINAL  Final    Labs: CBC: No results for input(s): "WBC", "NEUTROABS", "HGB", "HCT", "MCV", "PLT" in the last 168 hours. Basic Metabolic Panel: Recent Labs  Lab 03/04/24 0522  NA 139  K 3.8  CL 102  CO2 27  GLUCOSE 96  BUN 14  CREATININE 0.58  CALCIUM 8.7*   Liver Function Tests: No results for input(s): "AST", "ALT", "ALKPHOS", "BILITOT", "PROT", "ALBUMIN" in the last 168 hours. CBG: Recent Labs  Lab 03/05/24 1950 03/05/24 2338 03/06/24 0415 03/06/24  0735 03/06/24 1139  GLUCAP 123* 101* 96 94 103*    Discharge time spent: greater than 30 minutes.  Signed: Marolyn Haller, MD [facility] 03/06/2024

## 2024-03-14 LAB — PROINSULIN/INSULIN RATIO
Insulin: 33 u[IU]/mL — ABNORMAL HIGH
Proinsulin: 9.5 pmol/L

## 2024-05-21 ENCOUNTER — Ambulatory Visit: Admitting: "Endocrinology

## 2024-07-16 ENCOUNTER — Emergency Department (HOSPITAL_COMMUNITY): Payer: Self-pay

## 2024-07-16 ENCOUNTER — Encounter (HOSPITAL_COMMUNITY): Payer: Self-pay | Admitting: *Deleted

## 2024-07-16 ENCOUNTER — Other Ambulatory Visit: Payer: Self-pay

## 2024-07-16 ENCOUNTER — Emergency Department (HOSPITAL_COMMUNITY)
Admission: EM | Admit: 2024-07-16 | Discharge: 2024-07-16 | Disposition: A | Discharge status: Home / Self Care | Payer: Self-pay | Attending: Emergency Medicine | Admitting: Emergency Medicine

## 2024-07-16 DIAGNOSIS — R1013 Epigastric pain: Secondary | ICD-10-CM | POA: Insufficient documentation

## 2024-07-16 DIAGNOSIS — R112 Nausea with vomiting, unspecified: Secondary | ICD-10-CM | POA: Insufficient documentation

## 2024-07-16 DIAGNOSIS — R1011 Right upper quadrant pain: Secondary | ICD-10-CM | POA: Insufficient documentation

## 2024-07-16 DIAGNOSIS — R1010 Upper abdominal pain, unspecified: Secondary | ICD-10-CM

## 2024-07-16 DIAGNOSIS — R079 Chest pain, unspecified: Secondary | ICD-10-CM | POA: Insufficient documentation

## 2024-07-16 DIAGNOSIS — R1012 Left upper quadrant pain: Secondary | ICD-10-CM | POA: Insufficient documentation

## 2024-07-16 DIAGNOSIS — E876 Hypokalemia: Secondary | ICD-10-CM | POA: Insufficient documentation

## 2024-07-16 DIAGNOSIS — Z7982 Long term (current) use of aspirin: Secondary | ICD-10-CM | POA: Insufficient documentation

## 2024-07-16 LAB — CBC
HCT: 45 % (ref 36.0–46.0)
Hemoglobin: 15.6 g/dL — ABNORMAL HIGH (ref 12.0–15.0)
MCH: 32.7 pg (ref 26.0–34.0)
MCHC: 34.7 g/dL (ref 30.0–36.0)
MCV: 94.3 fL (ref 80.0–100.0)
Platelets: 285 K/uL (ref 150–400)
RBC: 4.77 MIL/uL (ref 3.87–5.11)
RDW: 13.3 % (ref 11.5–15.5)
WBC: 4.9 K/uL (ref 4.0–10.5)
nRBC: 0 % (ref 0.0–0.2)

## 2024-07-16 LAB — COMPREHENSIVE METABOLIC PANEL WITH GFR
ALT: 25 U/L (ref 0–44)
AST: 23 U/L (ref 15–41)
Albumin: 4.5 g/dL (ref 3.5–5.0)
Alkaline Phosphatase: 102 U/L (ref 38–126)
Anion gap: 18 — ABNORMAL HIGH (ref 5–15)
BUN: 15 mg/dL (ref 6–20)
CO2: 24 mmol/L (ref 22–32)
Calcium: 9.4 mg/dL (ref 8.9–10.3)
Chloride: 99 mmol/L (ref 98–111)
Creatinine, Ser: 0.62 mg/dL (ref 0.44–1.00)
GFR, Estimated: >60 mL/min (ref >60)
Glucose, Bld: 88 mg/dL (ref 70–99)
Potassium: 2.9 mmol/L — ABNORMAL LOW (ref 3.5–5.1)
Sodium: 141 mmol/L (ref 135–145)
Total Bilirubin: 0.6 mg/dL (ref 0.0–1.2)
Total Protein: 8.7 g/dL — ABNORMAL HIGH (ref 6.5–8.1)

## 2024-07-16 LAB — TROPONIN I (HIGH SENSITIVITY)
Troponin I (High Sensitivity): 4 ng/L (ref <18)
Troponin I (High Sensitivity): 4 ng/L (ref <18)

## 2024-07-16 LAB — LIPASE, BLOOD: Lipase: 31 U/L (ref 11–51)

## 2024-07-16 MED ORDER — SODIUM CHLORIDE 0.9 % IV BOLUS
500.0000 mL | Freq: Once | INTRAVENOUS | Status: AC
  Administered 2024-07-16: 500 mL via INTRAVENOUS

## 2024-07-16 MED ORDER — SUCRALFATE 1 G PO TABS: 1.0000 g | ORAL_TABLET | Freq: Three times a day (TID) | ORAL | 0 refills | Status: AC

## 2024-07-16 MED ORDER — ONDANSETRON HCL 4 MG PO TABS: 4.0000 mg | ORAL_TABLET | Freq: Three times a day (TID) | ORAL | 0 refills | Status: AC | PRN

## 2024-07-16 MED ORDER — POTASSIUM CHLORIDE CRYS ER 20 MEQ PO TBCR
40.0000 meq | EXTENDED_RELEASE_TABLET | Freq: Once | ORAL | Status: AC
  Administered 2024-07-16: 40 meq via ORAL
  Filled 2024-07-16: qty 2

## 2024-07-16 MED ORDER — MORPHINE SULFATE (PF) 4 MG/ML IV SOLN
4.0000 mg | Freq: Once | INTRAVENOUS | Status: AC
  Administered 2024-07-16: 4 mg via INTRAVENOUS
  Filled 2024-07-16: qty 1

## 2024-07-16 MED ORDER — IOHEXOL 300 MG/ML  SOLN
100.0000 mL | Freq: Once | INTRAMUSCULAR | Status: AC | PRN
  Administered 2024-07-16: 100 mL via INTRAVENOUS

## 2024-07-16 MED ORDER — ONDANSETRON HCL 4 MG/2ML IJ SOLN
4.0000 mg | Freq: Once | INTRAMUSCULAR | Status: AC
  Administered 2024-07-16: 4 mg via INTRAVENOUS
  Filled 2024-07-16: qty 2

## 2024-07-16 NOTE — Discharge Instructions (Addendum)
 You were seen in the emergency department for upper abdominal pain chest pain vomiting.  You had blood work EKG chest x-ray and a CAT scan of your abdomen that did not show an obvious explanation for your symptoms.  Your potassium level was low.  We are treating you with some acid medication and nausea medication.  Please contact your primary care doctor for close follow-up.  Return to the emergency department if any worsening or concerning symptoms.

## 2024-07-16 NOTE — ED Triage Notes (Signed)
 Pt with left chest pain and upper abd pain x 2 days.  + N/V, pt scheduled to have mammogram to left breast soon.

## 2024-07-16 NOTE — ED Provider Notes (Signed)
 [place] EMERGENCY DEPARTMENT AT [facility] Provider Note   CSN: [id number] Arrival date & time: 07/16/24  1705     Patient presents with: Chest Pain   Nicole Wyatt is a 58 y.o. female.  She is here with a complaint of 3 days of nausea and vomiting upper abdominal pain radiating into her chest.  She rates it as 10 out of 10.  No fever but she has been chilled.  No shortness of breath diarrhea constipation or urinary symptoms.  She has tried nothing for her symptoms.  {Add pertinent medical, surgical, social history, OB history to [id number]} The history is provided by the patient.  Abdominal Pain Pain location:  Epigastric, RUQ and LUQ Pain quality: aching   Pain radiates to:  Chest Pain severity:  Severe Onset quality:  Gradual Duration:  3 days Timing:  Constant Progression:  Unchanged Chronicity:  New Relieved by:  Nothing Worsened by:  Eating Associated symptoms: chest pain, chills, nausea and vomiting   Associated symptoms: no constipation, no cough, no diarrhea, no dysuria, no fever, no hematemesis and no shortness of breath        Prior to Admission medications   Medication Sig Start Date End Date Taking? Authorizing Provider  albuterol  (VENTOLIN  HFA) 108 (90 Base) MCG/ACT inhaler Inhale 2 puffs into the lungs every 4 (four) hours as needed for wheezing or shortness of breath. 04/17/21   Robinette Vermell PARAS, MD  amLODipine  (NORVASC ) 10 MG tablet Take 10 mg by mouth daily. 10/10/20   [provider]  aspirin  EC 81 MG tablet Take 1 tablet (81 mg total) by mouth 2 (two) times daily. For DVT prophylaxis for 30 days after surgery. Patient taking differently: Take 81 mg by mouth daily. 12/02/18   Delynn Victory Muller III, PA-C  budesonide -formoterol  (SYMBICORT ) 160-4.5 MCG/ACT inhaler Inhale 2 puffs into the lungs 2 (two) times daily. 08/18/20   Sood, Vineet, MD  carisoprodol (SOMA) 350 MG tablet Take 350 mg by mouth 4 (four) times daily as needed. 12/31/23    [provider]  cyclobenzaprine  (FLEXERIL ) 10 MG tablet Take 10 mg by mouth 3 (three) times daily. 10/04/23   [provider]  fluticasone  (FLONASE ) 50 MCG/ACT nasal spray Place 2 sprays into both nostrils daily. Patient taking differently: Place 2 sprays into both nostrils daily as needed for allergies. 02/16/19   Fisher, Devere ORN, PA-C  furosemide  (LASIX ) 40 MG tablet Take 1 tablet (40 mg total) by mouth daily as needed for fluid. Patient taking differently: Take 40 mg by mouth daily as needed for fluid or edema. 01/06/19   Hanford Powell BRAVO, NP  hydrOXYzine  (ATARAX ) 25 MG tablet Take 25 mg by mouth 2 (two) times daily. 03/02/24   [provider]  meloxicam  (MOBIC ) 15 MG tablet Take 1 tablet (15 mg total) by mouth daily. 09/21/21   Cuthriell, Dorn BIRCH, PA-C  nicotine  (NICODERM CQ  - DOSED IN MG/24 HOURS) 21 mg/24hr patch Place 1 patch (21 mg total) onto the skin daily. 03/06/24   Franchot Novel, MD  nystatin  (MYCOSTATIN /NYSTOP ) powder Apply topically 4 (four) times daily. 03/06/24   Franchot Novel, MD  omeprazole  (PRILOSEC) 40 MG capsule TAKE (1) CAPSULE TWICE DAILY BEFORE MEALS. 04/05/22   Unk Corinn Skiff, MD  ondansetron  (ZOFRAN -ODT) 4 MG disintegrating tablet Take 1 tablet (4 mg total) by mouth every 8 (eight) hours as needed for nausea or vomiting. 02/28/23   Siadecki, Sebastian, MD  phentermine (ADIPEX-P) 37.5 MG tablet Take 37.5 mg by  mouth daily. 01/30/24   [provider]  SPIRIVA  HANDIHALER 18 MCG inhalation capsule Place 18 mcg into inhaler and inhale daily. 09/14/20   [provider]  venlafaxine  (EFFEXOR ) 75 MG tablet Take 225 mg by mouth daily. 08/03/20   [provider]    Allergies: Bee venom, Percocet [oxycodone -acetaminophen ], Ciprofloxacin , and Penicillins    Review of Systems  Constitutional:  Positive for chills. Negative for fever.  Respiratory:  Negative for cough and shortness of breath.   Cardiovascular:  Positive for  chest pain.  Gastrointestinal:  Positive for abdominal pain, nausea and vomiting. Negative for constipation, diarrhea and hematemesis.  Genitourinary:  Negative for dysuria.    Updated Vital Signs BP 129/89 (BP Location: Right Arm)   Pulse 79   Temp 98.9 F (37.2 C) (Oral)   Resp (!) 22   Ht 5' 7 (1.702 m)   Wt 120.2 kg   SpO2 93%   BMI 41.50 kg/m   Physical Exam Vitals and nursing note reviewed.  Constitutional:      General: She is not in acute distress.    Appearance: Normal appearance. She is well-developed.  HENT:     Head: Normocephalic and atraumatic.  Eyes:     Conjunctiva/sclera: Conjunctivae normal.  Cardiovascular:     Rate and Rhythm: Normal rate and regular rhythm.     Heart sounds: Normal heart sounds. No murmur heard. Pulmonary:     Effort: Pulmonary effort is normal. No respiratory distress.     Breath sounds: Normal breath sounds. No stridor. No wheezing.  Abdominal:     Palpations: Abdomen is soft.     Tenderness: There is abdominal tenderness (upper abd). There is no guarding or rebound.  Musculoskeletal:        General: No tenderness or deformity. Normal range of motion.     Cervical back: Neck supple.  Skin:    General: Skin is warm and dry.  Neurological:     General: No focal deficit present.     Mental Status: She is alert.     GCS: GCS eye subscore is 4. GCS verbal subscore is 5. GCS motor subscore is 6.     (all labs ordered are listed, but only abnormal results are displayed) Labs Reviewed  CBC  LIPASE, BLOOD  COMPREHENSIVE METABOLIC PANEL WITH GFR  TROPONIN I (HIGH SENSITIVITY)    EKG: EKG Interpretation Date/Time:  Thursday July 16 2024 17:25:04 EDT Ventricular Rate:  79 PR Interval:  200 QRS Duration:  150 QT Interval:  430 QTC Calculation: 493 R Axis:   -77  Text Interpretation: Normal sinus rhythm Right bundle branch block Left anterior fascicular block Bifascicular block Left ventricular hypertrophy with repolarization  abnormality ( R in aVL , Romhilt-Estes ) Abnormal ECG When compared with ECG of 02-Mar-2024 19:33, No significant change since last tracing Confirmed by Towana Sharper [phone]) on 07/16/2024 5:38:49 PM  Radiology: No results found.  {Document cardiac monitor, telemetry assessment procedure when appropriate:32947} Procedures   Medications Ordered in the ED  morphine  (PF) 4 MG/ML injection 4 mg (has no administration in time range)  ondansetron  (ZOFRAN ) injection 4 mg (has no administration in time range)  sodium chloride  0.9 % bolus 500 mL (has no administration in time range)      {Click here for ABCD2, HEART and other calculators REFRESH Note before signing:1}  Medical Decision Making Amount and/or Complexity of Data Reviewed Labs: ordered. Radiology: ordered.  Risk Prescription drug management.   This patient complains of ***; this involves an extensive number of treatment Options and is a complaint that carries with it a high risk of complications and morbidity. The differential includes ***  I ordered, reviewed and interpreted labs, which included *** I ordered medication *** and reviewed PMP when indicated. I ordered imaging studies which included *** and I independently    visualized and interpreted imaging which showed *** Additional history obtained from *** Previous records obtained and reviewed *** I consulted *** and discussed lab and imaging findings and discussed disposition.  Cardiac monitoring reviewed, *** Social determinants considered, *** Critical Interventions: ***  After the interventions stated above, I reevaluated the patient and found *** Admission and further testing considered, ***   {Document critical care time when appropriate  Document review of labs and clinical decision tools ie CHADS2VASC2, etc  Document your independent review of radiology images and any outside records  Document your discussion with family  members, caretakers and with consultants  Document social determinants of health affecting pt's care  Document your decision making why or why not admission, treatments were needed:32947:::1}   Final diagnoses:  None    ED Discharge Orders     None

## 2024-10-22 ENCOUNTER — Other Ambulatory Visit: Payer: Self-pay

## 2024-10-22 ENCOUNTER — Emergency Department (HOSPITAL_COMMUNITY)

## 2024-10-22 ENCOUNTER — Encounter (HOSPITAL_COMMUNITY): Payer: Self-pay

## 2024-10-22 ENCOUNTER — Emergency Department (HOSPITAL_COMMUNITY)
Admission: EM | Admit: 2024-10-22 | Discharge: 2024-10-22 | Disposition: A | Discharge status: Home / Self Care | Attending: Emergency Medicine | Admitting: Emergency Medicine

## 2024-10-22 DIAGNOSIS — Z79899 Other long term (current) drug therapy: Secondary | ICD-10-CM | POA: Insufficient documentation

## 2024-10-22 DIAGNOSIS — M25562 Pain in left knee: Secondary | ICD-10-CM | POA: Diagnosis present

## 2024-10-22 DIAGNOSIS — M7122 Synovial cyst of popliteal space [Baker], left knee: Secondary | ICD-10-CM | POA: Diagnosis not present

## 2024-10-22 DIAGNOSIS — J449 Chronic obstructive pulmonary disease, unspecified: Secondary | ICD-10-CM | POA: Insufficient documentation

## 2024-10-22 DIAGNOSIS — Z7982 Long term (current) use of aspirin: Secondary | ICD-10-CM | POA: Insufficient documentation

## 2024-10-22 DIAGNOSIS — I10 Essential (primary) hypertension: Secondary | ICD-10-CM | POA: Diagnosis not present

## 2024-10-22 MED ORDER — OXYCODONE HCL 5 MG PO TABS
5.0000 mg | ORAL_TABLET | ORAL | Status: AC
  Administered 2024-10-22: 5 mg via ORAL
  Filled 2024-10-22: qty 1

## 2024-10-22 MED ORDER — HYDROCODONE-ACETAMINOPHEN 5-325 MG PO TABS: 1.0000 | ORAL_TABLET | ORAL | 0 refills | Status: AC | PRN

## 2024-10-22 MED ORDER — RIVAROXABAN 15 MG PO TABS
15.0000 mg | ORAL_TABLET | Freq: Once | ORAL | Status: AC
Start: 2024-10-22 — End: 2024-10-22
  Administered 2024-10-22: 15 mg via ORAL
  Filled 2024-10-22: qty 1

## 2024-10-22 NOTE — ED Provider Notes (Signed)
 [place] EMERGENCY DEPARTMENT AT [facility] Provider Note   CSN: [id number] Arrival date & time: 10/22/24  1705     Patient presents with: Knee Pain   Nicole Wyatt is a 58 y.o. female.   58 year old female history of COPD, depression, hypertension, hyperlipidemia, and sarcoidosis who presents emergency department left knee pain.  Patient reports that for the past 2 weeks has been having atraumatic left knee pain.  Says that her left knee and calf feel little bit swollen.  Does not recall any injuries.  No fevers.  No redness of the knee.  No chest pain or shortness of breath.  No history of DVT or PE or cancer.  No history of gout.  No history of any surgeries on that side       Prior to Admission medications   Medication Sig Start Date End Date Taking? Authorizing Provider  HYDROcodone -acetaminophen  (NORCO/VICODIN) 5-325 MG tablet Take 1 tablet by mouth every 4 (four) hours as needed. 10/22/24  Yes Yolande Lamar BROCKS, MD  albuterol  (VENTOLIN  HFA) 108 [phone] Base) MCG/ACT inhaler Inhale 2 puffs into the lungs every 4 (four) hours as needed for wheezing or shortness of breath. 04/17/21   Robinette Vermell PARAS, MD  amLODipine  (NORVASC ) 10 MG tablet Take 10 mg by mouth daily. 10/10/20   [provider]  aspirin  EC 81 MG tablet Take 1 tablet (81 mg total) by mouth 2 (two) times daily. For DVT prophylaxis for 30 days after surgery. Patient taking differently: Take 81 mg by mouth daily. 12/02/18   Delynn Victory Muller III, PA-C  budesonide -formoterol  (SYMBICORT ) 160-4.5 MCG/ACT inhaler Inhale 2 puffs into the lungs 2 (two) times daily. 08/18/20   Sood, Vineet, MD  carisoprodol (SOMA) 350 MG tablet Take 350 mg by mouth 4 (four) times daily as needed for muscle spasms. 12/31/23   [provider]  fluticasone  (FLONASE ) 50 MCG/ACT nasal spray Place 2 sprays into both nostrils daily. Patient taking differently: Place 2 sprays into both nostrils daily as needed for allergies.  02/16/19   Fisher, Devere ORN, PA-C  furosemide  (LASIX ) 40 MG tablet Take 1 tablet (40 mg total) by mouth daily as needed for fluid. Patient taking differently: Take 40 mg by mouth daily as needed for fluid or edema. 01/06/19   Hanford Powell BRAVO, NP  hydrOXYzine  (ATARAX ) 25 MG tablet Take 25 mg by mouth 2 (two) times daily. 03/02/24   [provider]  ibuprofen  (ADVIL ) 800 MG tablet Take 800 mg by mouth 3 (three) times daily. 07/13/24   [provider]  meloxicam  (MOBIC ) 15 MG tablet Take 1 tablet (15 mg total) by mouth daily. 09/21/21   Cuthriell, Dorn BIRCH, PA-C  nicotine  (NICODERM CQ  - DOSED IN MG/24 HOURS) 21 mg/24hr patch Place 1 patch (21 mg total) onto the skin daily. 03/06/24   Franchot Novel, MD  nystatin  (MYCOSTATIN /NYSTOP ) powder Apply topically 4 (four) times daily. Patient taking differently: Apply 1 Application topically 4 (four) times daily. 03/06/24   Franchot Novel, MD  omeprazole  (PRILOSEC) 40 MG capsule TAKE (1) CAPSULE TWICE DAILY BEFORE MEALS. Patient taking differently: Take 40 mg by mouth 2 (two) times daily before a meal. 04/05/22   Vanga, Corinn Skiff, MD  ondansetron  (ZOFRAN ) 4 MG tablet Take 1 tablet (4 mg total) by mouth every 8 (eight) hours as needed for nausea or vomiting. 07/16/24   Towana Ozell BROCKS, MD  SPIRIVA  HANDIHALER 18 MCG inhalation capsule Place 18 mcg into inhaler and inhale daily. 09/14/20  [provider]  sucralfate  (CARAFATE ) 1 g tablet Take 1 tablet (1 g total) by mouth 4 (four) times daily -  with meals and at bedtime. 07/16/24   Towana Ozell BROCKS, MD  venlafaxine  (EFFEXOR ) 75 MG tablet Take 225 mg by mouth daily. 08/03/20   [provider]    Allergies: Bee venom and Penicillins    Review of Systems  Updated Vital Signs BP (!) 168/80   Pulse 89   Temp 98.1 F (36.7 C) (Oral)   Resp 20   Ht 5' 7 (1.702 m)   Wt 120.2 kg   SpO2 99%   BMI 41.50 kg/m   Physical Exam Musculoskeletal:     Comments: Small  effusion of left knee.  No erythema or warmth.  Full range of motion of the left knee.  Habitus limits exam but does appear to have trace pitting edema of the left lower extremity.  DP pulse 2+ in the left leg.     (all labs ordered are listed, but only abnormal results are displayed) Labs Reviewed - No data to display  EKG: None  Radiology: DG Knee Complete 4 Views Left Result Date: 10/22/2024 CLINICAL DATA:  Left knee pain.  No known injury. EXAM: LEFT KNEE - COMPLETE 4+ VIEW COMPARISON:  None Available. FINDINGS: Lateral tibiofemoral joint space narrowing. Mild tricompartmental peripheral spurring. No fracture, erosion, or focal bone abnormality. Trace joint effusion. Unremarkable soft tissues. IMPRESSION: Mild tricompartmental osteoarthritis, most prominent in the lateral tibiofemoral compartment. Electronically Signed   By: Andrea Gasman M.D.   On: 10/22/2024 18:19     Procedures  EMERGENCY DEPARTMENT US  EXTREMITY EXAM Study:  Limited Duplex of left lower Extremity Veins  INDICATIONS: Leg pain Visualization of  regions in transverse plane with full compression visualized.   PERFORMED BY: Myself IMAGES ARCHIVED?: No VIEWS USED: Saphenous-femoral junction, Proximal femoral vein, and Popliteal vein INTERPRETATION: Indeterminate study and no DVT noted in the saphenofemoral junction or proximal femoral vein.  Did notice a large cyst in the popliteal fossa and was unable to visualize the popliteal vein      Medications Ordered in the ED  Rivaroxaban (XARELTO) tablet 15 mg (has no administration in time range)  oxyCODONE  (Oxy IR/ROXICODONE ) immediate release tablet 5 mg (5 mg Oral Given 10/22/24 1747)                                    Medical Decision Making Amount and/or Complexity of Data Reviewed Radiology: ordered.  Risk Prescription drug management.   58 year old female history of COPD, depression, hypertension, hyperlipidemia, and sarcoidosis who presents  emergency department left knee pain.   Initial Ddx:  Arthritis, septic joint, gout, DVT, Baker's cyst  MDM/Course:  Patient presents emergency department for atraumatic left knee pain.  Reports some swelling.  On exam has full range of motion.  No signs of a septic joint or gout. Does have some lower extremity edema.  Had an x-ray that shows arthritic changes without fracture.  Formal ultrasound not available at this time.  Performed a bedside ultrasound which did show a Baker's cyst but was unfortunately indeterminant because I was not able to fully visualize the popliteal vein.  I suspect that the Baker's cyst is causing the patient's symptoms but will have her return tomorrow for an ultrasound.  Given a dose of Xarelto in the meantime.  Upon re-evaluation was feeling much improved after the oxycodone   and ice.  Will have her follow-up with orthopedics about her Baker's cyst if it is found on the formal ultrasound and it is negative for DVT.  Will require anticoagulation if positive.  No signs of PE at this point in time.  This patient presents to the ED for concern of complaints listed in HPI, this involves an extensive number of treatment options, and is a complaint that carries with it a high risk of complications and morbidity. Disposition including potential need for admission considered.   Dispo: DC Home. Return precautions discussed including, but not limited to, those listed in the AVS. Allowed pt time to ask questions which were answered fully prior to dc.  Records reviewed Outpatient Clinic Notes I independently reviewed the following imaging with scope of interpretation limited to determining acute life threatening conditions related to emergency care: Extremity x-ray(s) and agree with the radiologist interpretation with the following exceptions: none I have reviewed the patients home medications and made adjustments as needed  Portions of this note were generated with Dragon dictation  software. Dictation errors may occur despite best attempts at proofreading.     Final diagnoses:  Acute pain of left knee  Synovial cyst of left popliteal space    ED Discharge Orders          Ordered    HYDROcodone -acetaminophen  (NORCO/VICODIN) 5-325 MG tablet  Every 4 hours PRN        10/22/24 2035               Yolande Lamar BROCKS, MD 10/22/24 2042

## 2024-10-22 NOTE — Discharge Instructions (Addendum)
 You were seen for your left knee pain in the emergency department.  It is likely from a Baker's cyst.  Please come back tomorrow between 9 to 11 AM to have an ultrasound of your leg to ensure that there is no blood clot.  At home, please use Tylenol  and ibuprofen  for your pain.  You may also use topical treatments like IcyHot.  You may also take the norco we have prescribed you for any breakthrough pain that may have.  Please note that it contains tylenol  so do not exceed the daily recommended dosage of tylenol . Do not take this before driving or operating heavy machinery.  Do not take this medication with alcohol.  Check your MyChart online for the results of any tests that had not resulted by the time you left the emergency department.   Follow-up with your primary doctor in 2-3 days regarding your visit.  Follow-up with orthopedics in a week.  Return immediately to the emergency department if you experience any of the following: Chest pain, shortness of breath, or any other concerning symptoms.    Thank you for visiting our Emergency Department. It was a pleasure taking care of you today.

## 2024-10-22 NOTE — ED Triage Notes (Signed)
 Pt arrives with c/o left knee pain that started last week. Pt reports swelling. Pt denies CP or SOB.

## 2024-10-23 ENCOUNTER — Telehealth (HOSPITAL_COMMUNITY): Payer: Self-pay | Admitting: Emergency Medicine

## 2024-10-23 NOTE — Telephone Encounter (Addendum)
 Order placed for DVT ultrasound. Pt called for update will come in for US  on Monday.

## 2024-10-28 ENCOUNTER — Telehealth (HOSPITAL_COMMUNITY): Payer: Self-pay | Admitting: Emergency Medicine

## 2024-10-28 MED ORDER — RIVAROXABAN (XARELTO) VTE STARTER PACK (15 & 20 MG): ORAL_TABLET | ORAL | 0 refills | Status: AC

## 2024-10-28 NOTE — Telephone Encounter (Signed)
 Patient called the emergency department.  Says that she is not able to get into her ultrasound until November 04, 2024.  Says she would prefer November 7 at 3 PM.  Says the leg is the same.  She is not having any chest pain or shortness of breath.  Explained to the patient how important the ultrasound is because the Xarelto is very high risk medication for bleeding.  I explained that we do need a definitive answer on whether or not there is a blood clot there but she states that she will not be able to get the ultrasound before this Friday or the 12th.  Will give her a prescription of Xarelto in the meantime until she is able to get her ultrasound

## 2024-11-04 ENCOUNTER — Ambulatory Visit (HOSPITAL_COMMUNITY): Admission: RE | Admit: 2024-11-04 | Source: Ambulatory Visit

## 2024-11-10 ENCOUNTER — Ambulatory Visit (HOSPITAL_COMMUNITY)
Admission: RE | Admit: 2024-11-10 | Discharge: 2024-11-10 | Disposition: A | Discharge status: Home / Self Care | Source: Ambulatory Visit | Attending: Emergency Medicine | Admitting: Emergency Medicine

## 2024-11-10 ENCOUNTER — Other Ambulatory Visit (HOSPITAL_COMMUNITY): Payer: Self-pay | Admitting: Emergency Medicine

## 2024-11-10 DIAGNOSIS — M79605 Pain in left leg: Secondary | ICD-10-CM | POA: Insufficient documentation

## 2024-11-10 NOTE — ED Provider Notes (Signed)
   Patient returned today for controlled ultrasound of left lower extremity.  No evidence of blood clot.  Likely Baker's cyst.  Patient agreeable to symptomatic treatment and close outpatient follow-up with her PCP as previously recommended.  Patient informed of ultrasound results.   US  Venous Img Lower Unilateral Left (DVT) Result Date: 11/10/2024 CLINICAL DATA:  LEFT lower extremity pain for 3 weeks EXAM: LEFT LOWER EXTREMITY VENOUS DOPPLER ULTRASOUND TECHNIQUE: Gray-scale sonography with compression, as well as color and duplex ultrasound, were performed to evaluate the deep venous system(s) from the level of the common femoral vein through the popliteal and proximal calf veins. COMPARISON:  None available FINDINGS: VENOUS Normal compressibility of the common femoral, superficial femoral, and popliteal veins, as well as the visualized calf veins. Visualized portions of profunda femoral vein and great saphenous vein unremarkable. No filling defects to suggest DVT on grayscale or color Doppler imaging. Doppler waveforms show normal direction of venous flow, normal respiratory plasticity and response to augmentation. Limited views of the contralateral common femoral vein are unremarkable. OTHER None. Limitations: none IMPRESSION: 1. No DVT of the left lower extremity. 2. 5.7 x 2.3 x 4.2 cm mildly complex fluid collection in the popliteal fossa is likely a Engineer, Production cyst. Electronically Signed   By: Aliene Lloyd M.D.   On: 11/10/2024 13:01       Herlinda Milling, PA-C 11/10/24 1321    Melvenia Motto, MD 11/10/24 1555

## 2025-01-29 ENCOUNTER — Emergency Department (HOSPITAL_COMMUNITY)

## 2025-01-29 ENCOUNTER — Emergency Department (HOSPITAL_COMMUNITY): Admission: EM | Admit: 2025-01-29 | Source: Home / Self Care

## 2025-01-29 ENCOUNTER — Other Ambulatory Visit: Payer: Self-pay

## 2025-01-29 ENCOUNTER — Encounter (HOSPITAL_COMMUNITY): Payer: Self-pay | Admitting: Emergency Medicine

## 2025-01-29 LAB — PROTIME-INR
INR: 0.9 (ref 0.8–1.2)
Prothrombin Time: 13.2 s (ref 11.4–15.2)

## 2025-01-29 LAB — CBC
HCT: 44.5 % (ref 36.0–46.0)
Hemoglobin: 15.1 g/dL — ABNORMAL HIGH (ref 12.0–15.0)
MCH: 33 pg (ref 26.0–34.0)
MCHC: 33.9 g/dL (ref 30.0–36.0)
MCV: 97.4 fL (ref 80.0–100.0)
Platelets: 295 10*3/uL (ref 150–400)
RBC: 4.57 MIL/uL (ref 3.87–5.11)
RDW: 13.7 % (ref 11.5–15.5)
WBC: 5.7 10*3/uL (ref 4.0–10.5)
nRBC: 0 % (ref 0.0–0.2)

## 2025-01-29 LAB — DIFFERENTIAL
Abs Immature Granulocytes: 0.01 10*3/uL (ref 0.00–0.07)
Basophils Absolute: 0 10*3/uL (ref 0.0–0.1)
Basophils Relative: 0 %
Eosinophils Absolute: 0 10*3/uL (ref 0.0–0.5)
Eosinophils Relative: 1 %
Immature Granulocytes: 0 %
Lymphocytes Relative: 31 %
Lymphs Abs: 1.7 10*3/uL (ref 0.7–4.0)
Monocytes Absolute: 0.5 10*3/uL (ref 0.1–1.0)
Monocytes Relative: 8 %
Neutro Abs: 3.4 10*3/uL (ref 1.7–7.7)
Neutrophils Relative %: 60 %

## 2025-01-29 LAB — COMPREHENSIVE METABOLIC PANEL WITH GFR
ALT: 13 U/L (ref 0–44)
AST: 19 U/L (ref 15–41)
Albumin: 4.3 g/dL (ref 3.5–5.0)
Alkaline Phosphatase: 117 U/L (ref 38–126)
Anion gap: 17 — ABNORMAL HIGH (ref 5–15)
BUN: 8 mg/dL (ref 6–20)
CO2: 24 mmol/L (ref 22–32)
Calcium: 9.2 mg/dL (ref 8.9–10.3)
Chloride: 101 mmol/L (ref 98–111)
Creatinine, Ser: 0.67 mg/dL (ref 0.44–1.00)
GFR, Estimated: >60 mL/min
Glucose, Bld: 88 mg/dL (ref 70–99)
Potassium: 3.1 mmol/L — ABNORMAL LOW (ref 3.5–5.1)
Sodium: 141 mmol/L (ref 135–145)
Total Bilirubin: 0.4 mg/dL (ref 0.0–1.2)
Total Protein: 8.1 g/dL (ref 6.5–8.1)

## 2025-01-29 LAB — ETHANOL: Alcohol, Ethyl (B): <15 mg/dL

## 2025-01-29 LAB — APTT: aPTT: 33 s (ref 24–36)

## 2025-01-29 NOTE — ED Triage Notes (Signed)
 Pt to Er with c/o headaches, left sided weakness and tingling on and off for the last week.  Pt reports only symptom at this time is a headache and she feels generally weak.  Pt was ambulatory to triage without difficulty.
